# Patient Record
Sex: Female | Born: 1950 | Race: White | Hispanic: No | Marital: Married | Smoking: Never smoker
Health system: Southern US, Community
[De-identification: ages and names within clinical notes are randomized; demographics above are authoritative.]

## PROBLEM LIST (undated history)

## (undated) DIAGNOSIS — K449 Diaphragmatic hernia without obstruction or gangrene: Secondary | ICD-10-CM

## (undated) DIAGNOSIS — D649 Anemia, unspecified: Secondary | ICD-10-CM

## (undated) DIAGNOSIS — T7840XA Allergy, unspecified, initial encounter: Secondary | ICD-10-CM

## (undated) DIAGNOSIS — I341 Nonrheumatic mitral (valve) prolapse: Secondary | ICD-10-CM

## (undated) DIAGNOSIS — Z889 Allergy status to unspecified drugs, medicaments and biological substances status: Secondary | ICD-10-CM

## (undated) DIAGNOSIS — Z950 Presence of cardiac pacemaker: Secondary | ICD-10-CM

## (undated) DIAGNOSIS — K5792 Diverticulitis of intestine, part unspecified, without perforation or abscess without bleeding: Secondary | ICD-10-CM

## (undated) DIAGNOSIS — I10 Essential (primary) hypertension: Secondary | ICD-10-CM

## (undated) DIAGNOSIS — S20219A Contusion of unspecified front wall of thorax, initial encounter: Secondary | ICD-10-CM

## (undated) DIAGNOSIS — M1990 Unspecified osteoarthritis, unspecified site: Secondary | ICD-10-CM

## (undated) DIAGNOSIS — K219 Gastro-esophageal reflux disease without esophagitis: Secondary | ICD-10-CM

## (undated) DIAGNOSIS — F329 Major depressive disorder, single episode, unspecified: Secondary | ICD-10-CM

## (undated) DIAGNOSIS — F419 Anxiety disorder, unspecified: Secondary | ICD-10-CM

## (undated) DIAGNOSIS — R011 Cardiac murmur, unspecified: Secondary | ICD-10-CM

## (undated) DIAGNOSIS — F32A Depression, unspecified: Secondary | ICD-10-CM

## (undated) DIAGNOSIS — H269 Unspecified cataract: Secondary | ICD-10-CM

## (undated) DIAGNOSIS — K635 Polyp of colon: Secondary | ICD-10-CM

## (undated) DIAGNOSIS — I442 Atrioventricular block, complete: Secondary | ICD-10-CM

## (undated) DIAGNOSIS — E785 Hyperlipidemia, unspecified: Secondary | ICD-10-CM

## (undated) DIAGNOSIS — K209 Esophagitis, unspecified: Secondary | ICD-10-CM

## (undated) HISTORY — DX: Diaphragmatic hernia without obstruction or gangrene: K44.9

## (undated) HISTORY — DX: Anxiety disorder, unspecified: F41.9

## (undated) HISTORY — DX: Unspecified cataract: H26.9

## (undated) HISTORY — PX: BREAST EXCISIONAL BIOPSY: SUR124

## (undated) HISTORY — PX: TUBAL LIGATION: SHX77

## (undated) HISTORY — DX: Allergy, unspecified, initial encounter: T78.40XA

## (undated) HISTORY — DX: Gastro-esophageal reflux disease without esophagitis: K21.9

## (undated) HISTORY — DX: Diverticulitis of intestine, part unspecified, without perforation or abscess without bleeding: K57.92

## (undated) HISTORY — PX: BREAST SURGERY: SHX581

## (undated) HISTORY — PX: APPENDECTOMY: SHX54

## (undated) HISTORY — DX: Depression, unspecified: F32.A

## (undated) HISTORY — PX: POLYPECTOMY: SHX149

## (undated) HISTORY — PX: UPPER GASTROINTESTINAL ENDOSCOPY: SHX188

## (undated) HISTORY — PX: EYE SURGERY: SHX253

## (undated) HISTORY — DX: Atrioventricular block, complete: I44.2

## (undated) HISTORY — DX: Anemia, unspecified: D64.9

## (undated) HISTORY — DX: Major depressive disorder, single episode, unspecified: F32.9

## (undated) HISTORY — DX: Polyp of colon: K63.5

## (undated) HISTORY — PX: PACEMAKER INSERTION: SHX728

## (undated) HISTORY — DX: Nonrheumatic mitral (valve) prolapse: I34.1

## (undated) HISTORY — DX: Hyperlipidemia, unspecified: E78.5

---

## 1970-06-21 HISTORY — PX: OTHER SURGICAL HISTORY: SHX169

## 1998-04-21 ENCOUNTER — Other Ambulatory Visit: Admission: RE | Admit: 1998-04-21 | Discharge: 1998-04-21 | Payer: Self-pay | Admitting: Obstetrics and Gynecology

## 1998-07-07 ENCOUNTER — Encounter: Payer: Self-pay | Admitting: *Deleted

## 1998-07-07 ENCOUNTER — Ambulatory Visit (HOSPITAL_BASED_OUTPATIENT_CLINIC_OR_DEPARTMENT_OTHER): Admission: RE | Admit: 1998-07-07 | Discharge: 1998-07-07 | Payer: Self-pay | Admitting: *Deleted

## 1998-07-08 HISTORY — PX: BREAST EXCISIONAL BIOPSY: SUR124

## 1998-11-14 ENCOUNTER — Ambulatory Visit (HOSPITAL_COMMUNITY): Admission: RE | Admit: 1998-11-14 | Discharge: 1998-11-14 | Payer: Self-pay | Admitting: Internal Medicine

## 1998-11-14 ENCOUNTER — Encounter: Payer: Self-pay | Admitting: Internal Medicine

## 1999-04-23 ENCOUNTER — Encounter (INDEPENDENT_AMBULATORY_CARE_PROVIDER_SITE_OTHER): Payer: Self-pay | Admitting: Specialist

## 1999-04-23 ENCOUNTER — Other Ambulatory Visit: Admission: RE | Admit: 1999-04-23 | Discharge: 1999-04-23 | Payer: Self-pay | Admitting: Obstetrics and Gynecology

## 1999-06-19 ENCOUNTER — Encounter: Payer: Self-pay | Admitting: Family Medicine

## 1999-06-19 ENCOUNTER — Encounter: Admission: RE | Admit: 1999-06-19 | Discharge: 1999-06-19 | Payer: Self-pay | Admitting: Family Medicine

## 1999-09-04 ENCOUNTER — Other Ambulatory Visit: Admission: RE | Admit: 1999-09-04 | Discharge: 1999-09-04 | Payer: Self-pay | Admitting: Obstetrics and Gynecology

## 1999-12-12 ENCOUNTER — Encounter: Payer: Self-pay | Admitting: Emergency Medicine

## 1999-12-12 ENCOUNTER — Emergency Department (HOSPITAL_COMMUNITY): Admission: EM | Admit: 1999-12-12 | Discharge: 1999-12-12 | Payer: Self-pay | Admitting: Emergency Medicine

## 2000-06-20 ENCOUNTER — Encounter: Payer: Self-pay | Admitting: Obstetrics and Gynecology

## 2000-06-20 ENCOUNTER — Encounter: Admission: RE | Admit: 2000-06-20 | Discharge: 2000-06-20 | Payer: Self-pay | Admitting: Obstetrics and Gynecology

## 2000-09-05 ENCOUNTER — Ambulatory Visit (HOSPITAL_COMMUNITY): Admission: RE | Admit: 2000-09-05 | Discharge: 2000-09-05 | Payer: Self-pay | Admitting: Cardiology

## 2000-09-05 ENCOUNTER — Encounter: Payer: Self-pay | Admitting: Cardiology

## 2000-10-12 ENCOUNTER — Other Ambulatory Visit: Admission: RE | Admit: 2000-10-12 | Discharge: 2000-10-12 | Payer: Self-pay | Admitting: Obstetrics and Gynecology

## 2001-06-28 ENCOUNTER — Encounter: Admission: RE | Admit: 2001-06-28 | Discharge: 2001-06-28 | Payer: Self-pay | Admitting: Obstetrics and Gynecology

## 2001-06-28 ENCOUNTER — Encounter: Payer: Self-pay | Admitting: Obstetrics and Gynecology

## 2001-11-15 ENCOUNTER — Other Ambulatory Visit: Admission: RE | Admit: 2001-11-15 | Discharge: 2001-11-15 | Payer: Self-pay | Admitting: Internal Medicine

## 2002-08-01 ENCOUNTER — Encounter: Admission: RE | Admit: 2002-08-01 | Discharge: 2002-08-01 | Payer: Self-pay | Admitting: Obstetrics and Gynecology

## 2002-08-01 ENCOUNTER — Encounter: Payer: Self-pay | Admitting: Obstetrics and Gynecology

## 2003-06-19 ENCOUNTER — Ambulatory Visit (HOSPITAL_COMMUNITY): Admission: RE | Admit: 2003-06-19 | Discharge: 2003-06-19 | Payer: Self-pay | Admitting: Cardiology

## 2003-10-11 ENCOUNTER — Encounter: Admission: RE | Admit: 2003-10-11 | Discharge: 2003-10-11 | Payer: Self-pay | Admitting: Obstetrics and Gynecology

## 2004-05-15 ENCOUNTER — Ambulatory Visit: Payer: Self-pay

## 2004-08-10 ENCOUNTER — Ambulatory Visit: Payer: Self-pay | Admitting: Family Medicine

## 2004-08-11 ENCOUNTER — Ambulatory Visit (HOSPITAL_COMMUNITY): Admission: RE | Admit: 2004-08-11 | Discharge: 2004-08-11 | Payer: Self-pay | Admitting: Family Medicine

## 2004-08-13 ENCOUNTER — Ambulatory Visit: Payer: Self-pay | Admitting: Cardiology

## 2004-10-14 ENCOUNTER — Ambulatory Visit: Payer: Self-pay | Admitting: Internal Medicine

## 2004-10-19 ENCOUNTER — Ambulatory Visit: Payer: Self-pay | Admitting: Internal Medicine

## 2004-10-19 HISTORY — PX: COLONOSCOPY: SHX174

## 2004-10-20 ENCOUNTER — Encounter (INDEPENDENT_AMBULATORY_CARE_PROVIDER_SITE_OTHER): Payer: Self-pay | Admitting: Specialist

## 2004-10-20 ENCOUNTER — Encounter: Payer: Self-pay | Admitting: Internal Medicine

## 2004-10-20 ENCOUNTER — Other Ambulatory Visit: Admission: RE | Admit: 2004-10-20 | Discharge: 2004-10-20 | Payer: Self-pay | Admitting: Internal Medicine

## 2004-11-11 ENCOUNTER — Ambulatory Visit: Payer: Self-pay | Admitting: Cardiology

## 2004-12-08 ENCOUNTER — Ambulatory Visit: Payer: Self-pay | Admitting: Internal Medicine

## 2004-12-23 ENCOUNTER — Ambulatory Visit: Payer: Self-pay | Admitting: Cardiology

## 2005-01-27 ENCOUNTER — Ambulatory Visit: Payer: Self-pay | Admitting: Cardiology

## 2005-04-28 ENCOUNTER — Encounter: Admission: RE | Admit: 2005-04-28 | Discharge: 2005-04-28 | Payer: Self-pay | Admitting: Family Medicine

## 2005-05-04 ENCOUNTER — Ambulatory Visit: Payer: Self-pay | Admitting: Cardiology

## 2005-05-25 ENCOUNTER — Ambulatory Visit: Payer: Self-pay | Admitting: Cardiology

## 2005-05-27 ENCOUNTER — Ambulatory Visit: Payer: Self-pay

## 2005-05-27 ENCOUNTER — Ambulatory Visit: Payer: Self-pay | Admitting: Cardiology

## 2005-08-04 ENCOUNTER — Ambulatory Visit: Payer: Self-pay | Admitting: Cardiology

## 2005-09-01 ENCOUNTER — Ambulatory Visit: Payer: Self-pay | Admitting: Cardiology

## 2005-10-13 ENCOUNTER — Ambulatory Visit: Payer: Self-pay | Admitting: Cardiology

## 2006-01-19 ENCOUNTER — Ambulatory Visit: Payer: Self-pay | Admitting: Cardiology

## 2006-02-03 ENCOUNTER — Encounter: Payer: Self-pay | Admitting: Cardiology

## 2006-02-03 ENCOUNTER — Ambulatory Visit: Payer: Self-pay

## 2006-04-27 ENCOUNTER — Ambulatory Visit: Payer: Self-pay | Admitting: Cardiology

## 2006-05-06 ENCOUNTER — Encounter: Admission: RE | Admit: 2006-05-06 | Discharge: 2006-05-06 | Payer: Self-pay | Admitting: *Deleted

## 2006-07-20 ENCOUNTER — Ambulatory Visit: Payer: Self-pay | Admitting: Cardiology

## 2006-10-12 ENCOUNTER — Ambulatory Visit: Payer: Self-pay | Admitting: Cardiology

## 2007-01-04 ENCOUNTER — Ambulatory Visit: Payer: Self-pay | Admitting: Cardiology

## 2007-02-10 ENCOUNTER — Ambulatory Visit: Payer: Self-pay | Admitting: Cardiology

## 2007-03-29 ENCOUNTER — Ambulatory Visit: Payer: Self-pay | Admitting: Cardiology

## 2007-06-02 ENCOUNTER — Encounter: Admission: RE | Admit: 2007-06-02 | Discharge: 2007-06-02 | Payer: Self-pay | Admitting: Obstetrics and Gynecology

## 2007-06-09 ENCOUNTER — Encounter: Admission: RE | Admit: 2007-06-09 | Discharge: 2007-06-09 | Payer: Self-pay | Admitting: Obstetrics and Gynecology

## 2007-06-21 ENCOUNTER — Ambulatory Visit: Payer: Self-pay | Admitting: Cardiology

## 2007-08-02 ENCOUNTER — Ambulatory Visit: Payer: Self-pay

## 2007-08-23 ENCOUNTER — Encounter: Payer: Self-pay | Admitting: Cardiology

## 2007-08-23 ENCOUNTER — Ambulatory Visit: Payer: Self-pay

## 2007-10-02 ENCOUNTER — Emergency Department (HOSPITAL_COMMUNITY): Admission: EM | Admit: 2007-10-02 | Discharge: 2007-10-02 | Payer: Self-pay | Admitting: Emergency Medicine

## 2007-10-03 ENCOUNTER — Ambulatory Visit: Payer: Self-pay

## 2007-10-09 ENCOUNTER — Telehealth: Payer: Self-pay | Admitting: Orthopedic Surgery

## 2007-10-09 ENCOUNTER — Ambulatory Visit: Payer: Self-pay | Admitting: Orthopedic Surgery

## 2007-10-10 DIAGNOSIS — S20219A Contusion of unspecified front wall of thorax, initial encounter: Secondary | ICD-10-CM

## 2007-10-10 HISTORY — DX: Contusion of unspecified front wall of thorax, initial encounter: S20.219A

## 2007-10-11 ENCOUNTER — Encounter: Payer: Self-pay | Admitting: Orthopedic Surgery

## 2007-10-23 ENCOUNTER — Ambulatory Visit: Payer: Self-pay | Admitting: Orthopedic Surgery

## 2007-11-01 ENCOUNTER — Ambulatory Visit: Payer: Self-pay | Admitting: Cardiology

## 2007-11-03 ENCOUNTER — Ambulatory Visit: Payer: Self-pay | Admitting: Cardiology

## 2007-11-03 ENCOUNTER — Ambulatory Visit (HOSPITAL_COMMUNITY): Admission: RE | Admit: 2007-11-03 | Discharge: 2007-11-03 | Payer: Self-pay | Admitting: Cardiology

## 2007-11-27 ENCOUNTER — Encounter: Payer: Self-pay | Admitting: Orthopedic Surgery

## 2007-12-05 ENCOUNTER — Ambulatory Visit: Payer: Self-pay | Admitting: Cardiology

## 2008-01-03 ENCOUNTER — Ambulatory Visit: Payer: Self-pay | Admitting: Cardiology

## 2008-01-12 ENCOUNTER — Encounter: Payer: Self-pay | Admitting: Orthopedic Surgery

## 2008-03-20 ENCOUNTER — Encounter: Payer: Self-pay | Admitting: Orthopedic Surgery

## 2008-04-03 ENCOUNTER — Ambulatory Visit: Payer: Self-pay | Admitting: Cardiology

## 2008-05-24 ENCOUNTER — Encounter: Payer: Self-pay | Admitting: Cardiology

## 2008-05-24 ENCOUNTER — Ambulatory Visit: Payer: Self-pay

## 2008-05-30 ENCOUNTER — Ambulatory Visit: Payer: Self-pay | Admitting: Cardiology

## 2008-06-07 ENCOUNTER — Encounter: Admission: RE | Admit: 2008-06-07 | Discharge: 2008-06-07 | Payer: Self-pay | Admitting: Obstetrics and Gynecology

## 2008-06-25 DIAGNOSIS — I10 Essential (primary) hypertension: Secondary | ICD-10-CM | POA: Insufficient documentation

## 2008-06-25 DIAGNOSIS — K219 Gastro-esophageal reflux disease without esophagitis: Secondary | ICD-10-CM | POA: Insufficient documentation

## 2008-06-25 DIAGNOSIS — K209 Esophagitis, unspecified without bleeding: Secondary | ICD-10-CM | POA: Insufficient documentation

## 2008-06-25 DIAGNOSIS — K449 Diaphragmatic hernia without obstruction or gangrene: Secondary | ICD-10-CM | POA: Insufficient documentation

## 2008-06-25 DIAGNOSIS — I059 Rheumatic mitral valve disease, unspecified: Secondary | ICD-10-CM | POA: Insufficient documentation

## 2008-06-25 DIAGNOSIS — I429 Cardiomyopathy, unspecified: Secondary | ICD-10-CM | POA: Insufficient documentation

## 2008-06-25 DIAGNOSIS — E785 Hyperlipidemia, unspecified: Secondary | ICD-10-CM | POA: Insufficient documentation

## 2008-06-25 HISTORY — DX: Esophagitis, unspecified without bleeding: K20.90

## 2008-06-28 ENCOUNTER — Ambulatory Visit: Payer: Self-pay | Admitting: Internal Medicine

## 2008-06-28 DIAGNOSIS — Z860100 Personal history of colon polyps, unspecified: Secondary | ICD-10-CM | POA: Insufficient documentation

## 2008-07-02 ENCOUNTER — Encounter: Payer: Self-pay | Admitting: Internal Medicine

## 2008-07-02 ENCOUNTER — Ambulatory Visit: Payer: Self-pay | Admitting: Internal Medicine

## 2008-07-02 ENCOUNTER — Ambulatory Visit (HOSPITAL_COMMUNITY): Admission: RE | Admit: 2008-07-02 | Discharge: 2008-07-02 | Payer: Self-pay | Admitting: Internal Medicine

## 2008-07-03 ENCOUNTER — Encounter: Payer: Self-pay | Admitting: Internal Medicine

## 2008-07-04 ENCOUNTER — Ambulatory Visit: Payer: Self-pay | Admitting: Cardiology

## 2008-07-08 ENCOUNTER — Telehealth (INDEPENDENT_AMBULATORY_CARE_PROVIDER_SITE_OTHER): Payer: Self-pay | Admitting: *Deleted

## 2008-08-09 ENCOUNTER — Ambulatory Visit: Payer: Self-pay | Admitting: Internal Medicine

## 2008-10-04 ENCOUNTER — Encounter (INDEPENDENT_AMBULATORY_CARE_PROVIDER_SITE_OTHER): Payer: Self-pay

## 2008-11-02 ENCOUNTER — Ambulatory Visit: Payer: Self-pay | Admitting: Cardiology

## 2008-11-07 DIAGNOSIS — F410 Panic disorder [episodic paroxysmal anxiety] without agoraphobia: Secondary | ICD-10-CM | POA: Insufficient documentation

## 2008-11-07 DIAGNOSIS — F411 Generalized anxiety disorder: Secondary | ICD-10-CM | POA: Insufficient documentation

## 2008-11-07 DIAGNOSIS — I442 Atrioventricular block, complete: Secondary | ICD-10-CM | POA: Insufficient documentation

## 2009-01-31 ENCOUNTER — Telehealth: Payer: Self-pay | Admitting: Cardiology

## 2009-01-31 ENCOUNTER — Encounter: Payer: Self-pay | Admitting: Cardiology

## 2009-01-31 ENCOUNTER — Ambulatory Visit: Payer: Self-pay | Admitting: Internal Medicine

## 2009-05-05 ENCOUNTER — Ambulatory Visit: Payer: Self-pay | Admitting: Cardiology

## 2009-05-06 ENCOUNTER — Telehealth: Payer: Self-pay | Admitting: Cardiology

## 2009-06-04 ENCOUNTER — Ambulatory Visit: Payer: Self-pay | Admitting: Cardiology

## 2009-06-12 ENCOUNTER — Encounter: Admission: RE | Admit: 2009-06-12 | Discharge: 2009-06-12 | Payer: Self-pay | Admitting: *Deleted

## 2009-06-12 ENCOUNTER — Encounter: Payer: Self-pay | Admitting: Cardiology

## 2009-06-24 ENCOUNTER — Encounter: Admission: RE | Admit: 2009-06-24 | Discharge: 2009-06-24 | Payer: Self-pay | Admitting: *Deleted

## 2009-08-04 ENCOUNTER — Ambulatory Visit: Payer: Self-pay | Admitting: Cardiology

## 2009-11-03 ENCOUNTER — Ambulatory Visit: Payer: Self-pay | Admitting: Cardiology

## 2010-02-02 ENCOUNTER — Ambulatory Visit: Payer: Self-pay | Admitting: Cardiology

## 2010-02-03 ENCOUNTER — Telehealth (INDEPENDENT_AMBULATORY_CARE_PROVIDER_SITE_OTHER): Payer: Self-pay | Admitting: *Deleted

## 2010-04-17 ENCOUNTER — Encounter (INDEPENDENT_AMBULATORY_CARE_PROVIDER_SITE_OTHER): Payer: Self-pay | Admitting: *Deleted

## 2010-04-23 ENCOUNTER — Encounter: Payer: Self-pay | Admitting: Internal Medicine

## 2010-04-23 ENCOUNTER — Telehealth: Payer: Self-pay | Admitting: Cardiology

## 2010-05-22 ENCOUNTER — Encounter: Payer: Self-pay | Admitting: Cardiology

## 2010-05-22 ENCOUNTER — Ambulatory Visit (HOSPITAL_COMMUNITY)
Admission: RE | Admit: 2010-05-22 | Discharge: 2010-05-22 | Payer: Self-pay | Source: Home / Self Care | Admitting: Cardiology

## 2010-05-22 ENCOUNTER — Ambulatory Visit: Payer: Self-pay

## 2010-05-22 ENCOUNTER — Ambulatory Visit: Payer: Self-pay | Admitting: Cardiology

## 2010-05-22 DIAGNOSIS — R011 Cardiac murmur, unspecified: Secondary | ICD-10-CM | POA: Insufficient documentation

## 2010-06-18 ENCOUNTER — Encounter
Admission: RE | Admit: 2010-06-18 | Discharge: 2010-06-18 | Payer: Self-pay | Source: Home / Self Care | Attending: *Deleted | Admitting: *Deleted

## 2010-07-03 ENCOUNTER — Ambulatory Visit
Admission: RE | Admit: 2010-07-03 | Discharge: 2010-07-03 | Payer: Self-pay | Source: Home / Self Care | Attending: Cardiology | Admitting: Cardiology

## 2010-07-03 DIAGNOSIS — R079 Chest pain, unspecified: Secondary | ICD-10-CM | POA: Insufficient documentation

## 2010-07-06 ENCOUNTER — Encounter: Payer: Self-pay | Admitting: Internal Medicine

## 2010-07-21 NOTE — Miscellaneous (Signed)
Summary: Nexium Refill  Clinical Lists Changes  Medications: Changed medication from NEXIUM 40 MG  CPDR (ESOMEPRAZOLE MAGNESIUM) 1 by mouth two times a day to NEXIUM 40 MG  CPDR (ESOMEPRAZOLE MAGNESIUM) 1 by mouth two times a day. - Signed Rx of NEXIUM 40 MG  CPDR (ESOMEPRAZOLE MAGNESIUM) 1 by mouth two times a day.;  #180 x 1;  Signed;  Entered by: Lamona Curl CMA (AAMA);  Authorized by: Hart Carwin MD;  Method used: Electronically to [facility]*, [address], [address] Shipping/mailing, [place], [place]  [address], Ph: [phone], Fax: [phone]    Prescriptions: NEXIUM 40 MG  CPDR (ESOMEPRAZOLE MAGNESIUM) 1 by mouth two times a day.  #180 x 1   Entered by:   Lamona Curl CMA (AAMA)   Authorized by:   Hart Carwin MD   Signed by:   Lamona Curl CMA (AAMA) on 04/23/2010   Method used:   Electronically to        [facility]* (retail)       [address]       [address] Shipping/mailing       [place], [place]  [address]       Ph: [phone]       Fax: [phone]   RxID:   [id number]

## 2010-07-21 NOTE — Cardiovascular Report (Signed)
Summary: TTM   TTM   Imported By: Roderic Ovens 03/19/2010 16:10:19  _____________________________________________________________________  External Attachment:    Type:   Image     Comment:   External Document

## 2010-07-21 NOTE — Cardiovascular Report (Signed)
Summary: TTM   [facility]   Imported By: Roderic Ovens 09/18/2009 13:29:56  _____________________________________________________________________  External Attachment:    Type:   Image     Comment:   External Document

## 2010-07-21 NOTE — Progress Notes (Signed)
Summary: pt has question regarding transmission   Phone Note Outgoing Call Call back at [phone]   Caller: Patient Reason for Call: Talk to Nurse, Talk to Doctor Summary of Call: pt has a question regarding the transmission she sent last night  Initial call taken by: Omer Jack,  February 03, 2010 11:35 AM Summary of Call: spoke w/pt --had phone check thru [employer] and was concerned about the magnet she had.  pt has an old transmitter and magnet does not have hole in middle.  Told pt we could give her new magnet at next visit if need be but the magnet she has is working fine. Vella Kohler  February 03, 2010 3:26 PM

## 2010-07-21 NOTE — Cardiovascular Report (Signed)
Summary: Transtelephonic Pacemaker Monitoring Report  Transtelephonic Pacemaker Monitoring Report   Imported By: Marylou Mccoy 02/12/2010 10:21:53  _____________________________________________________________________  External Attachment:    Type:   Image     Comment:   External Document

## 2010-07-21 NOTE — Miscellaneous (Signed)
Summary: dx correction   Clinical Lists Changes  Problems: Changed problem from PACEMAKER (ICD-V45.Marland Kitchen01) to PACEMAKER, PERMANENT (ICD-V45.01) changed the incorrect dx code to correct dx code Genella Mech  April 17, 2010 12:23 PM

## 2010-07-21 NOTE — Progress Notes (Signed)
Summary: strange pain in chest   Phone Note Call from Patient   Caller: Patient [phone] Reason for Call: Talk to Nurse Summary of Call: pt having a strange pain in chest behind left breast area started yesterday at 2p, not sure if gas bubble, took antacid some refief but not completely gone away-has pacemaker and has never felt anything like this before-pls call  Initial call taken by: Glynda Jaeger,  April 23, 2010 11:09 AM  Follow-up for Phone Call        I called and spoke with the pt. She states that her discomfort behind the left breast did start about 2:00pm yesterday. She ate a salad, roll, & pie for lunch, but she states eating salad is not out of the norm for her. She took rolaids yesterday without relief. She has taken alka-seltzer x 2 doses this morning with some relief. She has been unable to belch. She denies any radation of the discomfort to any other areas. She also denies nausea and vomiting. She feels this is GI related. I explained she could monitor this over the next couple of hours to see how she feels. If her symptoms worsen in the same area, she will call me back and let me know. We will bring her in to see Dr. Juanda Chance. If her symptoms get worse with radiation of discomfort to any other areas, she will report to the ER. She verbalizes understanding and is agreeable. Follow-up by: Sherri Rad, RN, BSN,  April 23, 2010 11:23 AM

## 2010-07-21 NOTE — Assessment & Plan Note (Signed)
Summary: f1y  Medications Added NEXIUM 40 MG  CPDR (ESOMEPRAZOLE MAGNESIUM) 1 tab two times a day EQL B-6 100 MG  TABS (PYRIDOXINE HCL) 2 tab once daily MULTIVITAMINS   TABS (MULTIPLE VITAMIN) 1 tab once daily FISH OIL 1000 MG CAPS (OMEGA-3 FATTY ACIDS) 1 cap once daily MUCINEX 600 MG XR12H-TAB (GUAIFENESIN) 1 tab once daily as needed        Visit Type:  1 yr f/u Primary Jehieli Brassell:  [name] MD  CC:  pt states she had 2 months ago some cp but thinks tis was a muscle strain this did resolve on it's own...denies any other cardiac complaints today.  History of Present Illness: The patient is 60 years old and return for followup management of her pacemaker. She has a Engineer, water. 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  About 6 weeks ago she had an episode of left-sided chest pain while at work which lasted about 24 hours. There was no associated shortness of breath nausea diaphoresis or palpitations. There was no relation to breathing or position. Her symptoms later resolved.   She has been under great stress at work. 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  Other problems include hypertension and hyperlipidemia.  Current Medications (verified): 1)  Buspar 10 Mg  Tabs (Buspirone Hcl) .... Tid 2)  Wellbutrin Xl 300 Mg  Tb24 (Bupropion Hcl) .Marland Kitchen.. 1qd 3)  Lisinopril-Hydrochlorothiazide 20-12.5 Mg  Tabs (Lisinopril-Hydrochlorothiazide) .... One Qd 4)  Crestor 10 Mg  Tabs (Rosuvastatin Calcium) .Marland Kitchen.. 1 By Mouth Once Daily 5)  Prempro 0.45-1.5 Mg Tabs (Conj Estrog-Medroxyprogest Ace) .Marland Kitchen.. 1 Tab Once Daily 6)  Nexium 40 Mg  Cpdr (Esomeprazole Magnesium) .Marland Kitchen.. 1 Tab Two Times A Day 7)  Eql B-6 100 Mg  Tabs (Pyridoxine Hcl) .... 2 Tab Once Daily 8)  Multivitamins   Tabs (Multiple Vitamin) .Marland Kitchen.. 1 Tab Once Daily 9)  Zyrtec Allergy 10 Mg Tabs (Cetirizine Hcl) .Marland Kitchen.. 1 By Mouth Once Daily As Needed 10)  Fish Oil 1000 Mg Caps (Omega-3 Fatty Acids) .Marland Kitchen.. 1 Cap Once Daily 11)  Astepro  137 Mcg/spray Soln (Azelastine Hcl) .... One Sray As Needed 12)  Lorazepam 0.5 Mg Tabs (Lorazepam) .Marland Kitchen.. 1 Tab At Bedtime 13)  Mucinex 600 Mg Xr12h-Tab (Guaifenesin) .Marland Kitchen.. 1 Tab Once Daily As Needed  Allergies: 1)  ! Keflex 2)  ! Pcn  Past History:  Past Medical History: Reviewed history from 11/07/2008 and no changes required. CARDIOMYOPATHY (ICD-425.4) PACEMAKER (ICD-V45.Marland Kitchen01) MITRAL VALVE PROLAPSE (ICD-424.0) HYPERLIPIDEMIA (ICD-272.4) HYPERTENSION (ICD-401.9) ANXIETY (ICD-300.00) CONTUSION, RIGHT RIB (ICD-922.1) CHEST WALL CONTUSION (ICD-922.1) HIATAL HERNIA (ICD-553.3) GERD (ICD-530.81) COLONIC POLYPS, HX OF (ICD-V12.72) Hx of ESOPHAGITIS (ICD-530.10)  Review of Systems       ROS is negative except as outlined in HPI.   Vital Signs:  Patient profile:   60 year old female Height:      61 inches Weight:      158.50 pounds BMI:     30.06 Pulse rate:   92 / minute Pulse rhythm:   irregular BP sitting:   118 / 70  (left arm) Cuff size:   large  Vitals Entered By: Danielle Rankin, CMA (May 22, 2010 1:44 PM)  Physical Exam  Additional Exam:  Gen. Well-nourished, in no distress   Neck: No JVD, thyroid not enlarged, no carotid bruits Lungs: No tachypnea, clear without rales, rhonchi or wheezes Cardiovascular: Rhythm regular, PMI not displaced, grade 2-3/6 harsh pansystolic murmur at the apex, accentuated S2 which sounded  single, no peripheral edema, pulses normal in all 4 extremities. Abdomen: BS normal, abdomen soft and non-tender without masses or organomegaly, no hepatosplenomegaly. MS: No deformities, no cyanosis or clubbing   Neuro:  No focal sns   Skin:  no lesions    PPM Specifications Following MD:  Everardo Beals. Juanda Chance, MD     PPM Vendor:  St Jude     PPM Model Number:  [id number]     PPM Serial Number:  [id number] PPM DOI:  06/19/2003     PPM Implanting MD:  Everardo Beals. Juanda Chance, MD  Lead 1    Location: RV     DOI: 09/07/1990     Model #: 1028T     Serial #: [id number]      Status: active Lead 2    Location: LV     DOI: 09/07/1990     Model #: 1216T     Serial #: [id number]     Status: active   Indications:  CHB   PPM Follow Up Battery Voltage:  2.69 V     Battery Est. Longevity:  1.25-2 yrs     Pacer Dependent:  Yes       PPM Device Measurements Atrium  Amplitude: 2.4 mV, Impedance: 449 ohms, Threshold: 1.25 V at 0.6 msec Right Ventricle  Amplitude: PACED mV, Impedance: 426 ohms, Threshold: 1.0 V at 0.4 msec  Episodes MS Episodes:  7     Percent Mode Switch:  <1%     Coumadin:  No Ventricular High Rate:  0     Atrial Pacing:  <1%     Ventricular Pacing:  99%  Parameters Mode:  DDD     Lower Rate Limit:  60     Upper Rate Limit:  130 Paced AV Delay:  170     Sensed AV Delay:  150 Next Cardiology Appt Due:  11/20/2010 Tech Comments:  7 AMS EPISODES--LONGEST WAS 3 MINUTES 16 SECONDS.  NORMAL DEVICE FUNCTION.  CHANGED ER SENSITIVITY FROM 2.3 TO 3.82mV AND RV OUTPUT FROM 1.5 TO 1.250 DUE TO AUTOCAPTURE BEING TURNED ON.  TURNED RATE RESPONSE ON DURING MODE SWITCH. ROV IN 6 MTHS W/DEVICE CLINIC. Vella Kohler  May 25, 2010 11:09 AM  Impression & Recommendations:  Problem # 1:  CARDIAC MURMUR (ICD-785.2)  She has a new systolic murmur that sounds like mitral regurgitation. She had episode of chest pain 6 weeks ago. I am suspicious that she has a ruptured cordae. We'll plan to add her with an echocardiogram.  D. echocardiogram today and I reviewed this with Dr. Myrtis Ser. She has some slight bowing of the anterior leaflet. There is no clear flail  leaflet. She has mild possibly moderate mitral regurgitation.  I think that she may have ruptured chordae accounting for her new murmur and possibly her recent chest pain. I think we need to her fairly closely. I arranged for her to see Dr. Shirlee Latch back in 5 and 6 weeks. I don't think we'll need to do an echo then unless there is some change in her symptomatology and her physical findings. I told her to call us if she has  any increased shortness of breath.   Her updated medication list for this problem includes:    Lisinopril-hydrochlorothiazide 20-12.5 Mg Tabs (Lisinopril-hydrochlorothiazide) ..... One qd  Orders: Echocardiogram (Echo)  Problem # 2:  PACEMAKER, PERMANENT (ICD-V45.01) She is a St. Jude pacemaker was implanted for AV block. We'll plan to interrogate this today.  Problem # 3:  HYPERTENSION (ICD-401.9) This is well controlled on current medications. Her updated medication list for this problem includes:    Lisinopril-hydrochlorothiazide 20-12.5 Mg Tabs (Lisinopril-hydrochlorothiazide) ..... One qd  Patient Instructions: 1)  Echocardiogram today. 2)  Your physician recommends that you schedule a follow-up appointment in: 6 weeks with Dr. Shirlee Latch. 3)  Your physician wants you to follow-up in: 1 year with Dr. Johney Frame for your pacemaker followup.  You will receive a reminder letter in the mail two months in advance. If you don't receive a letter, please call our office to schedule the follow-up appointment. 4)  Your physician recommends that you continue on your current medications as directed. Please refer to the Current Medication list given to you today.

## 2010-07-23 NOTE — Cardiovascular Report (Signed)
Summary: Office Visit   Office Visit   Imported By: Roderic Ovens 06/18/2010 09:52:04  _____________________________________________________________________  External Attachment:    Type:   Image     Comment:   External Document

## 2010-07-23 NOTE — Assessment & Plan Note (Signed)
Summary: ec6      Allergies Added:   Visit Type:  np Primary Provider:  [name] MD  CC:  headache.  History of Present Illness: 60 yo with history of pacemaker for AV block and MV prolapse presents for cardiology followup.  She saw Dr. Juanda Chance in December with an episode of chest pain and concern for possible worsening mitral regurgitation/chordal rupture.  Echo was done showing mitral valve prolapse and only mild mitral regurgitation.  She has not had any more chest pain.  She has chronic dyspnea when climbing a flight of steps.  She is able to walk on flat ground without problems.  No orthopnea or PND.  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    Current Medications (verified): 1)  Buspar 10 Mg  Tabs (Buspirone Hcl) .... Tid 2)  Wellbutrin Xl 300 Mg  Tb24 (Bupropion Hcl) .Marland Kitchen.. 1qd 3)  Lisinopril-Hydrochlorothiazide 20-12.5 Mg  Tabs (Lisinopril-Hydrochlorothiazide) .... One Qd 4)  Crestor 10 Mg  Tabs (Rosuvastatin Calcium) .Marland Kitchen.. 1 By Mouth Once Daily 5)  Prempro 0.45-1.5 Mg Tabs (Conj Estrog-Medroxyprogest Ace) .Marland Kitchen.. 1 Tab Once Daily 6)  Nexium 40 Mg  Cpdr (Esomeprazole Magnesium) .Marland Kitchen.. 1 Tab Two Times A Day 7)  Eql B-6 100 Mg  Tabs (Pyridoxine Hcl) .... 2 Tab Once Daily 8)  Multivitamins   Tabs (Multiple Vitamin) .Marland Kitchen.. 1 Tab Once Daily 9)  Zyrtec Allergy 10 Mg Tabs (Cetirizine Hcl) .Marland Kitchen.. 1 By Mouth Once Daily As Needed 10)  Fish Oil 1000 Mg Caps (Omega-3 Fatty Acids) .Marland Kitchen.. 1 Cap Once Daily 11)  Astepro 137 Mcg/spray Soln (Azelastine Hcl) .... One Sray As Needed 12)  Lorazepam 0.5 Mg Tabs (Lorazepam) .Marland Kitchen.. 1 Tab At Bedtime 13)  Mucinex 600 Mg Xr12h-Tab (Guaifenesin) .Marland Kitchen.. 1 Tab Once Daily As Needed  Allergies (verified): 1)  ! Keflex 2)  ! Pcn  Past History:  Past Medical History: 1. 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  2. 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  3. HYPERLIPIDEMIA (ICD-272.4) 4. HYPERTENSION (ICD-401.9) 5. ANXIETY (ICD-300.00) 6. HIATAL HERNIA (ICD-553.3) 7. GERD (ICD-530.81) 8. COLONIC POLYPS, HX OF (ICD-V12.72) 9. Hx of ESOPHAGITIS (ICD-530.10) 10. LHC (5/09): no coronary disease  Family History: Reviewed history from 06/25/2008 and no changes required. Family History of Diabetes: Mother, Grandmother Family History of Heart Disease: Maternal Grandmother, Maternal Aunt  Social History: Illicit Drug Use - no Patient has never smoked.  Alcohol Use - no Patient does not get regular exercise.  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   Vital Signs:  Patient profile:   60 year old female Height:      61 inches Weight:      155.75 pounds BMI:     29.54 Pulse rate:   93 / minute BP sitting:   137 / 73  (left arm) Cuff size:   regular  Vitals Entered By: Caralee Ates CMA (July 03, 2010 10:06 AM)  Physical Exam  General:  Well developed, well nourished, in no acute distress. Neck:  Neck supple, no JVD. No masses, thyromegaly or abnormal cervical nodes. Lungs:  Clear bilaterally to auscultation and percussion. Heart:  Non-displaced PMI,  chest non-tender; regular rate and rhythm, S1, S2 without rubs or gallops. 2/6 HSM at apex.  Carotid upstroke normal, no bruit. Pedals normal pulses. No edema, no varicosities. Abdomen:  Bowel sounds positive; abdomen soft and non-tender without masses, organomegaly, or hernias noted. No hepatosplenomegaly. Extremities:  No clubbing or cyanosis. Neurologic:  Alert and oriented x 3. Psych:  Normal affect.   PPM Specifications Following MD:  Everardo Beals. Juanda Chance, MD     PPM Vendor:  St Jude     PPM Model Number:  [id number]     PPM Serial Number:  [id number] PPM DOI:  06/19/2003     PPM  Implanting MD:  Everardo Beals. Juanda Chance, MD  Lead 1    Location: RV     DOI: 09/07/1990     Model #: 1028T     Serial #: [id number]     Status: active Lead 2    Location: LV     DOI: 09/07/1990     Model #: 1216T     Serial #: [id number]     Status: active   Indications:  CHB   PPM Follow Up Pacer Dependent:  Yes      Episodes Coumadin:  No  Parameters Mode:  DDD     Lower Rate Limit:  60     Upper Rate Limit:  130 Paced AV Delay:  170     Sensed AV Delay:  150  Impression & Recommendations:  Problem # 1:  MITRAL VALVE PROLAPSE (ICD-424.0) Echo showed stable mild mitral regurgitation in the setting of mitral valve prolapse.  Followup echo in 1 year.   Problem # 2:  PACEMAKER, PERMANENT (ICD-V45.01) Getting regular pacemaker followup.   Problem # 3:  CHEST PAIN-UNSPECIFIED (ICD-786.50) No further chest pain.  Of note, had negative cath in 2009.   Patient Instructions: 1)  Your physician wants you to follow-up in: 1 year.  You will receive a reminder letter in the mail two months in advance. If you don't receive a letter, please call our office to schedule the follow-up appointment. 2)  Your physician has requested that you have an echocardiogram in 1 year.  Echocardiography is a painless test that uses sound waves to create images of your heart. It provides your doctor with information about the size and shape of your heart and how well your heart's chambers and valves are working.  This procedure takes approximately one hour. There are no restrictions for this procedure. 3)  Your physician recommends that you continue on your current medications as directed. Please refer to the Current Medication list given to you today.

## 2010-07-23 NOTE — Letter (Signed)
Summary: Endoscopy Letter  [facility]  [address] [place], [place] [address]   Phone: [phone]  Fax: [phone]      July 06, 2010 MRN: [id number]   Carmen Green [address] [place], [place]  [address]   Dear Ms. Smithers,   According to your medical record, it is time for you to schedule an Endoscopy. Endoscopic screening is recommended for patients with certain upper digestive tract conditions because of associated increased risk for cancers of the upper digestive system.  This letter has been generated based on the recommendations made at the time of your prior procedure. If you feel that in your particular situation this may no longer apply, please contact our office.  Please call our office at [phone]) to schedule this appointment or to update your records at your earliest convenience.  Thank you for cooperating with [place] to provide you with the very best care possible.   Sincerely,  Hedwig Morton. Juanda Chance, M.D.  [facility] Gastroenterology Division [phone]

## 2010-08-22 ENCOUNTER — Encounter: Payer: Self-pay | Admitting: Internal Medicine

## 2010-08-22 DIAGNOSIS — I442 Atrioventricular block, complete: Secondary | ICD-10-CM

## 2010-08-26 ENCOUNTER — Encounter (INDEPENDENT_AMBULATORY_CARE_PROVIDER_SITE_OTHER): Payer: Self-pay | Admitting: *Deleted

## 2010-09-01 NOTE — Letter (Signed)
Summary: Pre Visit Letter Revised  [facility]  [address] [place], [place] [address]   Phone: [phone]  Fax: [phone]        08/26/2010 MRN: [id number] Carmen Green [address] [place], [place]  [address]             Procedure Date:  10-02-10           Recall Endo---Dr. Juanda Chance   Welcome to the Gastroenterology Division at [facility].    You are scheduled to see a nurse for your pre-procedure visit on 09-18-10 at 1:30p.m. on the 3rd floor at [facility], [address]  We ask that you try to arrive at our office 15 minutes prior to your appointment time to allow for check-in.  Please take a minute to review the attached form.  If you answer "Yes" to one or more of the questions on the first page, we ask that you call the person listed at your earliest opportunity.  If you answer "No" to all of the questions, please complete the rest of the form and bring it to your appointment.    Your nurse visit will consist of discussing your medical and surgical history, your immediate family medical history, and your medications.   If you are unable to list all of your medications on the form, please bring the medication bottles to your appointment and we will list them.  We will need to be aware of both prescribed and over the counter drugs.  We will need to know exact dosage information as well.    Please be prepared to read and sign documents such as consent forms, a financial agreement, and acknowledgement forms.  If necessary, and with your consent, a friend or relative is welcome to sit-in on the nurse visit with you.  Please bring your insurance card so that we may make a copy of it.  If your insurance requires a referral to see a specialist, please bring your referral form from your primary care physician.  No co-pay is required for this nurse visit.     If you cannot keep your appointment, please call [phone] to cancel or reschedule prior to your  appointment date.  This allows [place] the opportunity to schedule an appointment for another patient in need of care.    Thank you for choosing [facility] for your medical needs.  We appreciate the opportunity to care for you.  Please visit [place] at our website  to learn more about our practice.  Sincerely, The Gastroenterology Division

## 2010-09-17 ENCOUNTER — Other Ambulatory Visit (INDEPENDENT_AMBULATORY_CARE_PROVIDER_SITE_OTHER): Payer: 59

## 2010-09-17 ENCOUNTER — Telehealth: Payer: Self-pay | Admitting: Internal Medicine

## 2010-09-17 DIAGNOSIS — R1084 Generalized abdominal pain: Secondary | ICD-10-CM

## 2010-09-17 DIAGNOSIS — R109 Unspecified abdominal pain: Secondary | ICD-10-CM

## 2010-09-17 LAB — CBC WITH DIFFERENTIAL/PLATELET
Basophils Absolute: 0.1 10*3/uL (ref 0.0–0.1)
Basophils Relative: 0.6 % (ref 0.0–3.0)
Eosinophils Absolute: 0.1 10*3/uL (ref 0.0–0.7)
Eosinophils Relative: 0.8 % (ref 0.0–5.0)
HCT: 34.8 % — ABNORMAL LOW (ref 36.0–46.0)
Hemoglobin: 11.9 g/dL — ABNORMAL LOW (ref 12.0–15.0)
Lymphocytes Relative: 8.9 % — ABNORMAL LOW (ref 12.0–46.0)
Lymphs Abs: 1.1 10*3/uL (ref 0.7–4.0)
MCHC: 34.3 g/dL (ref 30.0–36.0)
MCV: 97.4 fl (ref 78.0–100.0)
Monocytes Absolute: 1.1 10*3/uL — ABNORMAL HIGH (ref 0.1–1.0)
Monocytes Relative: 8.7 % (ref 3.0–12.0)
Neutro Abs: 10.1 10*3/uL — ABNORMAL HIGH (ref 1.4–7.7)
Neutrophils Relative %: 81 % — ABNORMAL HIGH (ref 43.0–77.0)
Platelets: 249 10*3/uL (ref 150.0–400.0)
RBC: 3.58 Mil/uL — ABNORMAL LOW (ref 3.87–5.11)
RDW: 11.6 % (ref 11.5–14.6)
WBC: 12.4 10*3/uL — ABNORMAL HIGH (ref 4.5–10.5)

## 2010-09-17 MED ORDER — CIPROFLOXACIN HCL 500 MG PO TABS: 500.0000 mg | ORAL_TABLET | Freq: Two times a day (BID) | ORAL | Status: DC

## 2010-09-17 NOTE — Telephone Encounter (Signed)
Please schedule CT scan of the abd/pelvis with oral and IV contrast r/o diverticulitis. I have revviewed her chart. 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 Please start full liquid diet x 24 hours, Cipro 500mg  po bid,#14, 0 refill she will need a renoal profile and CBC before CT scan.

## 2010-09-17 NOTE — Telephone Encounter (Signed)
Patient calling to report abdominal pain just below her belly button that goes to her back since Tuesday. States she thought it was a UTI and she saw her PCP yesterday. Urine was clear except a "trace of blood." She had chills and temp.of 100 yesterday. PCP gave her pain pills which she did not take. States she is having nausea but this is her usual. She has been constipated and had painful bowel movements. She took Miralax and this helped some. Denies vomiting, bleeding or diarrhea. States she did have a kidney stone when she was 60 years old. 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 Please, advise.

## 2010-09-17 NOTE — Telephone Encounter (Signed)
Spoke with patient and gave her Dr. Regino Schultze recommendations. Patient to come for labs and to pick up contrast today. CT scan abd/pelvis with contrast scheduled at [facility] CT on 09/18/10 at 9:00 AM. Patient to be NPO 4 hours prior and to drink contrast two hours and one hour prior. Contrast up front for patient pick up. Rx sent to patients pharmacy.

## 2010-09-18 ENCOUNTER — Ambulatory Visit (INDEPENDENT_AMBULATORY_CARE_PROVIDER_SITE_OTHER)
Admission: RE | Admit: 2010-09-18 | Discharge: 2010-09-18 | Disposition: A | Discharge status: Home / Self Care | Payer: 59 | Source: Ambulatory Visit | Attending: Internal Medicine | Admitting: Internal Medicine

## 2010-09-18 ENCOUNTER — Telehealth: Payer: Self-pay | Admitting: *Deleted

## 2010-09-18 ENCOUNTER — Encounter: Payer: Self-pay | Admitting: Internal Medicine

## 2010-09-18 ENCOUNTER — Inpatient Hospital Stay (HOSPITAL_COMMUNITY)
Admission: AD | Admit: 2010-09-18 | Discharge: 2010-09-21 | DRG: 392 | Disposition: A | Discharge status: Home / Self Care | Payer: 59 | Source: Ambulatory Visit | Attending: Internal Medicine | Admitting: Internal Medicine

## 2010-09-18 ENCOUNTER — Ambulatory Visit (INDEPENDENT_AMBULATORY_CARE_PROVIDER_SITE_OTHER): Payer: 59 | Admitting: Internal Medicine

## 2010-09-18 ENCOUNTER — Inpatient Hospital Stay (HOSPITAL_COMMUNITY): Payer: 59

## 2010-09-18 VITALS — BP 122/70 | HR 80 | Wt 156.8 lb

## 2010-09-18 DIAGNOSIS — K630 Abscess of intestine: Secondary | ICD-10-CM | POA: Diagnosis present

## 2010-09-18 DIAGNOSIS — R933 Abnormal findings on diagnostic imaging of other parts of digestive tract: Secondary | ICD-10-CM

## 2010-09-18 DIAGNOSIS — K5732 Diverticulitis of large intestine without perforation or abscess without bleeding: Secondary | ICD-10-CM

## 2010-09-18 DIAGNOSIS — I4430 Unspecified atrioventricular block: Secondary | ICD-10-CM | POA: Diagnosis present

## 2010-09-18 DIAGNOSIS — R109 Unspecified abdominal pain: Secondary | ICD-10-CM

## 2010-09-18 DIAGNOSIS — R1032 Left lower quadrant pain: Secondary | ICD-10-CM

## 2010-09-18 DIAGNOSIS — E785 Hyperlipidemia, unspecified: Secondary | ICD-10-CM | POA: Diagnosis present

## 2010-09-18 DIAGNOSIS — I10 Essential (primary) hypertension: Secondary | ICD-10-CM | POA: Diagnosis present

## 2010-09-18 DIAGNOSIS — Z950 Presence of cardiac pacemaker: Secondary | ICD-10-CM

## 2010-09-18 DIAGNOSIS — F411 Generalized anxiety disorder: Secondary | ICD-10-CM | POA: Diagnosis present

## 2010-09-18 DIAGNOSIS — K219 Gastro-esophageal reflux disease without esophagitis: Secondary | ICD-10-CM | POA: Diagnosis present

## 2010-09-18 HISTORY — DX: Essential (primary) hypertension: I10

## 2010-09-18 HISTORY — DX: Presence of cardiac pacemaker: Z95.0

## 2010-09-18 LAB — COMPREHENSIVE METABOLIC PANEL
ALT: 14 U/L (ref 0–35)
Albumin: 3.7 g/dL (ref 3.5–5.2)
Alkaline Phosphatase: 53 U/L (ref 39–117)
Glucose, Bld: 109 mg/dL — ABNORMAL HIGH (ref 70–99)
Potassium: 3.2 mEq/L — ABNORMAL LOW (ref 3.5–5.1)
Sodium: 133 mEq/L — ABNORMAL LOW (ref 135–145)
Total Protein: 7.6 g/dL (ref 6.0–8.3)

## 2010-09-18 LAB — SEDIMENTATION RATE: Sed Rate: 61 mm/hr — ABNORMAL HIGH (ref 0–22)

## 2010-09-18 LAB — CBC
HCT: 35.1 % — ABNORMAL LOW (ref 36.0–46.0)
Platelets: 249 10*3/uL (ref 150–400)
RDW: 11.4 % — ABNORMAL LOW (ref 11.5–15.5)
WBC: 11.3 10*3/uL — ABNORMAL HIGH (ref 4.0–10.5)

## 2010-09-18 LAB — URINALYSIS, ROUTINE W REFLEX MICROSCOPIC
Glucose, UA: NEGATIVE mg/dL
Ketones, ur: NEGATIVE mg/dL
Protein, ur: NEGATIVE mg/dL

## 2010-09-18 LAB — TSH: TSH: 0.967 u[IU]/mL (ref 0.350–4.500)

## 2010-09-18 MED ORDER — IOHEXOL 300 MG/ML  SOLN
100.0000 mL | Freq: Once | INTRAMUSCULAR | Status: AC | PRN
  Administered 2010-09-18: 100 mL via INTRAVENOUS

## 2010-09-18 NOTE — Telephone Encounter (Signed)
Patient scheduled on 09/18/10 at 2:00 PM with Dr. Juanda Chance. Patient aware.

## 2010-09-18 NOTE — Patient Instructions (Signed)
You are being admitted to [facility]. Please go to admitting at [facility].

## 2010-09-18 NOTE — Telephone Encounter (Signed)
Message copied by Jesse Fall on Fri Sep 18, 2010 11:18 AM ------      Message from: [name]      Created: Fri Sep 18, 2010 10:57 AM       Please add pt on to my schedule today.
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
## 2010-09-20 DIAGNOSIS — R933 Abnormal findings on diagnostic imaging of other parts of digestive tract: Secondary | ICD-10-CM

## 2010-09-20 DIAGNOSIS — R1032 Left lower quadrant pain: Secondary | ICD-10-CM

## 2010-09-20 DIAGNOSIS — K5732 Diverticulitis of large intestine without perforation or abscess without bleeding: Secondary | ICD-10-CM

## 2010-09-20 LAB — BASIC METABOLIC PANEL
CO2: 26 mEq/L (ref 19–32)
Calcium: 8.9 mg/dL (ref 8.4–10.5)
Chloride: 107 mEq/L (ref 96–112)
GFR calc Af Amer: >60 mL/min (ref >60)
Sodium: 138 mEq/L (ref 135–145)

## 2010-09-20 LAB — CBC
Hemoglobin: 12 g/dL (ref 12.0–15.0)
MCHC: 33.8 g/dL (ref 30.0–36.0)
RBC: 3.78 MIL/uL — ABNORMAL LOW (ref 3.87–5.11)

## 2010-09-20 LAB — DIFFERENTIAL
Basophils Absolute: 0 10*3/uL (ref 0.0–0.1)
Lymphocytes Relative: 27 % (ref 12–46)
Monocytes Relative: 14 % — ABNORMAL HIGH (ref 3–12)

## 2010-09-21 ENCOUNTER — Inpatient Hospital Stay (HOSPITAL_COMMUNITY): Payer: 59

## 2010-09-21 ENCOUNTER — Telehealth: Payer: Self-pay | Admitting: *Deleted

## 2010-09-21 DIAGNOSIS — R933 Abnormal findings on diagnostic imaging of other parts of digestive tract: Secondary | ICD-10-CM

## 2010-09-21 DIAGNOSIS — K5732 Diverticulitis of large intestine without perforation or abscess without bleeding: Secondary | ICD-10-CM

## 2010-09-21 DIAGNOSIS — K5720 Diverticulitis of large intestine with perforation and abscess without bleeding: Secondary | ICD-10-CM

## 2010-09-21 MED ORDER — IOHEXOL 300 MG/ML  SOLN
100.0000 mL | Freq: Once | INTRAMUSCULAR | Status: AC | PRN
  Administered 2010-09-21: 100 mL via INTRAVENOUS

## 2010-09-21 NOTE — Telephone Encounter (Signed)
Left a message for Rose to call me back to schedule CT abd/pelvis with contrast f/u diverticulitis with contained perforation as per Mike Gip, PA

## 2010-09-21 NOTE — Telephone Encounter (Signed)
Spoke with Rose at [place] [facility] and scheduled patient for  10/05/10 at 9AM  For CT abd/pelvis with contrast.

## 2010-09-21 NOTE — Telephone Encounter (Signed)
Pharmacy called re: metronidazole bid or tid (500mg ) - tid was written onto printed Rx with bid originally that must have been iven at dc I authorized tid

## 2010-09-22 ENCOUNTER — Telehealth: Payer: Self-pay | Admitting: *Deleted

## 2010-09-22 NOTE — Telephone Encounter (Signed)
Spoke with patient gave her appointment date and time for MD visit and CT scan. Patient states she had a CT scan last night per Dr. Zachery Dakins. Spoke with Mike Gip, PA and told her patient had a CT scan last night. Per Mike Gip, PA cancel the CT scan on 10/05/10 but keep MD appointment.  Patient notified. [place] CT notified of cancellation(Rose)

## 2010-09-28 ENCOUNTER — Telehealth: Payer: Self-pay | Admitting: Internal Medicine

## 2010-09-28 NOTE — Telephone Encounter (Signed)
Patient was referred to the surgeon while she was inpateint last weekend.  Her appt with [facility] is on 10/02/10, she wants to wait to go see the surgeon until she comes in and sees Dr Juanda Chance on 10/06/10.  I have advised the patient that is fine she will contact [facility] to reschedule

## 2010-10-02 ENCOUNTER — Other Ambulatory Visit: Payer: Self-pay | Admitting: Internal Medicine

## 2010-10-05 ENCOUNTER — Other Ambulatory Visit: Payer: 59

## 2010-10-06 ENCOUNTER — Ambulatory Visit (INDEPENDENT_AMBULATORY_CARE_PROVIDER_SITE_OTHER): Payer: 59 | Admitting: Internal Medicine

## 2010-10-06 ENCOUNTER — Encounter: Payer: Self-pay | Admitting: Internal Medicine

## 2010-10-06 VITALS — BP 128/64 | HR 62 | Ht 62.0 in | Wt 152.0 lb

## 2010-10-06 DIAGNOSIS — K2270 Barrett's esophagus without dysplasia: Secondary | ICD-10-CM

## 2010-10-06 DIAGNOSIS — R933 Abnormal findings on diagnostic imaging of other parts of digestive tract: Secondary | ICD-10-CM

## 2010-10-06 DIAGNOSIS — K5732 Diverticulitis of large intestine without perforation or abscess without bleeding: Secondary | ICD-10-CM

## 2010-10-06 MED ORDER — FLUCONAZOLE 100 MG PO TABS: ORAL_TABLET | ORAL | Status: DC

## 2010-10-06 MED ORDER — PEG-KCL-NACL-NASULF-NA ASC-C 100 G PO SOLR: 1.0000 | Freq: Once | ORAL | Status: AC

## 2010-10-06 NOTE — Progress Notes (Signed)
Carmen Green Oct 03, 1950 MRN [id number]    History of Present Illness:  This is a 60 year old white female who is status post hospitalization for perforated diverticulitis. She is doing well. She was admitted on September 18, 2010 with a CT scan of the abdomen showing a 5 mm right long base nodule. It also showed sigmoid thickening and inflammatory changes consistent with diverticulitis with focal collection of 3.2x2.3 cm containing debris, gas and contrast material. The findings were concerning for focal perforation. There was no drainable abscess. She did well on IV antibiotics and subsequently oral antibiotics and is currently asymptomatic. She is having regular bowel movements. There is a history of Barrett's esophagus. She is due for a repeat upper endoscopy in January 2012. She has a history of an esophageal stricture which was dilated in January 2010 to 17 mm. Her last colonoscopy in May 2006 showed a tortuous colon.   Past Medical History  Diagnosis Date  . Hypertension   . Hx of appendectomy   . Pacemaker   . Hyperlipidemia   . AV block     with St. Jude dual chamber PCM  . Mitral valve prolapse   . Anxiety   . Hiatal hernia   . GERD (gastroesophageal reflux disease)   . Colon polyp   . Esophagitis   . Diverticulitis    Past Surgical History  Procedure Date  . Left ovary cyst removal 1972  . Pacemaker insertion 1992, 2005    2005- replaced pacemaker  . Tubal ligation   . Colonoscopy 10/19/2004    [facility]    reports that she has never smoked. She has never used smokeless tobacco. She reports that she does not drink alcohol or use illicit drugs. family history includes Diabetes in her mother and Heart disease in her maternal aunt and maternal grandmother.  There is no history of Colon cancer. Allergies  Allergen Reactions  . Cephalexin   . Penicillins         Review of Systems: Denies dysphagia chest pain or shortness of breath. Negative for rectal bleeding diarrhea or  constipation.  The remainder of the 10  point ROS is negative except as outlined in H&P   Physical Exam: General appearance  Well developed, in no distress. Eyes- non icteric. HEENT nontraumatic, normocephalic. Mouth no lesions, tongue papillated, no cheilosis. Neck supple without adenopathy, thyroid not enlarged, no carotid bruits, no JVD. Lungs Clear to auscultation bilaterally. Cor normal S1 normal S2, regular rhythm , no murmur,  quiet precordium. Abdomen soft abdomen, nondistended. Normoactive bowel sounds. No focal tenderness or fullness.  Extremities no pedal edema. Skin no lesions. Neurological alert and oriented x 3. Psychological normal mood and affect.  Assessment and Plan:  Problems #1 status post diverticulitis with focal perforation. Patient is doing well. I have discussed the possibility of sigmoid resection. She would like to hold off on that for now. We have discussed the possibility of recurrent diverticulitis. She will start on Benefiber one heaping teaspoon daily and a high fiber diet. She is due for a colonoscopy which will be done within the next 6 weeks.  Problem #2 Barrett's esophagus. Patient's last endoscopy was in January 2010. A repeat colonoscopy will be due in January 2012. We will schedule her for an upper endoscopy the same day as the colonoscopy in June 2012. She is to continue Nexium 40 mg daily.    10/06/2010 [name]

## 2010-10-06 NOTE — Patient Instructions (Signed)
You have been scheduled for an endoscopy and colonoscopy. Please follow the written instructions given to you at your visit today. Please pick up your Moviprep Kit at the pharmacy within the next 2-3 days. Please pick up your Diflucan at the pharmacy. You will take 1 tablet by mouth once daily x 5 days.

## 2010-10-07 ENCOUNTER — Encounter: Payer: Self-pay | Admitting: Internal Medicine

## 2010-10-22 NOTE — Consult Note (Signed)
NAME:  Carmen Green, Carmen Green              ACCOUNT NO.:  [id number]  MEDICAL RECORD NO.:  [id number]           PATIENT TYPE:  I  LOCATION:  1302                         FACILITY[facility]  PHYSICIAN:  Anselm Pancoast. Zachery Dakins, M.D.DATE OF BIRTH:  January 30, 1951  DATE OF CONSULTATION:  09/21/2010 DATE OF DISCHARGE:  09/21/2010                                CONSULTATION   REQUESTING PHYSICIAN:  [name], M.D.  CONSULTING SURGEON:  Anselm Pancoast. Zachery Dakins, M.D.  REASON FOR CONSULTATION:  Diverticulitis.  HISTORY OF PRESENT ILLNESS:  Ms. Hinkle is a pleasant 60 year old female who was admitted for new-onset diverticulitis.  She had complained of abdominal pain and was seen by Gastroenterology and subsequently admitted and worked up for diverticulitis.  She had a CT scan that showed evidence of wall thickening of the sigmoid colon consistent with diverticulitis in an adjacent area measuring about 3 x 2 cm with gas and debris suggestive of abscess.  The patient had been admitted and placed on IV antibiotics and was actually improving quite well on conservative management.  The Gastroenterology Service asked [place] to evaluate the patient for consultation as the patient is likely to need surgical intervention in the future.  PAST MEDICAL HISTORY:  The patient does have a history of AV block with a St. Jude pacemaker, history of mitral valve prolapse with a most recent ejection fraction 55% to 60%, history of hyperlipidemia, hypertension, anxiety, esophageal reflux disease with hiatal hernia, history of colonic polyps.  PAST SURGICAL HISTORY:  As noted in the past medical history.  No abdominal surgery previous.  SOCIAL HISTORY:  The patient has a history of diabetes on her mother side as well as heart disease.  SOCIAL HISTORY:  The patient denies any alcohol, tobacco or illicit drug use.  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  REVIEW OF SYSTEMS:  Please see history  of present illness for pertinent findings.  ALLERGIES:  Include KEFLEX and PENICILLIN.  CURRENT MEDICATIONS:  Include BuSpar, Wellbutrin, lisinopril/HCTZ, Crestor, Prempro, Nexium, multivitamin, Zyrtec, fish oil, Astepro, lorazepam and Mucinex p.r.n.  She is currently on Cipro and Flagyl IV.  PHYSICAL EXAMINATION:  GENERAL:  Physical exam reveals 60 year old thin Caucasian female, nontoxic appearing, otherwise not in distress. VITAL SIGNS:  Current vital signs showed temperature of 98.8, heart rate of 72, respiratory rate of 16, blood pressure 130/75, oxygen saturation 96% on room air. ENT:  Unremarkable. NECK:  Supple without lymphadenopathy. LUNGS:  Clear to auscultation.  No wheezes, rhonchi or rales.  Normal respiratory effort without use of accessory muscles. HEART:  Paced rhythm.  No murmurs or rubs.  Peripheral pulses intact and symmetrical. ABDOMEN:  Soft, nondistended.  No mass effect is appreciated.  Minimal tender in the left lower quadrant. RECTAL:  Exam is deferred. EXTREMITIES:  Good active range of motion of all extremities without grimace or pain.  Normal muscle strength and tone without atrophy. SKIN:  Otherwise warm and dry with good turgor.  No rash, lesions, or nodules. NEUROLOGIC:  The patient is alert and oriented x3.  LABORATORY DATA:  Most recent labs show white blood cell count of 3.5,  decreased from 12.4 on admission.  Hemoglobin of 12.0, hematocrit of 35.5, platelet count of 265.  Metabolic panel showed sodium of 138, potassium of 4.0, chloride of 107, CO2 of 26, BUN of 3, creatinine of 0.8, glucose of 142.  IMAGING:  CT scan on September 18, 2010, showed evidence of sigmoid diverticulitis with focal collection approximately 3 x 2 cm in size containing gas and debris consistent with abscess.  IMPRESSION: 1. Acute sigmoid diverticulitis with contained abscess from probable     microperforation. 2. Multiple medical comorbidities as mentioned in history  of present     illness and past medical history.  RECOMMENDATIONS:  After review of the films with the radiology team, it was suggested that CT scan could be repeated with the thought that if the abscess had increased in size, this could possibly be amenable to percutaneous drain.  Therefore we will order a repeat CT scan with Interventional Radiology to look at the films to see if percutaneous drain can be achieved if the abscess is no better or enlarged.  Would otherwise agree with current discharge plan for gastroenterology with followup in our clinic electively.     Brayton El, PA-C   ______________________________ Anselm Pancoast. Zachery Dakins, M.D.    KB/MEDQ  D:  09/22/2010  T:  09/22/2010  Job:  [id number]  Electronically Signed by Brayton El  on 10/12/2010 02:09:26 PM Electronically Signed by Consuello Bossier M.D. on 10/22/2010 11:49:11 AM

## 2010-11-03 NOTE — Assessment & Plan Note (Signed)
[facility]                            CARDIOLOGY OFFICE NOTE   NAME:Carmen Green, Carmen Green                     MRN:          [id number]  DATE:11/02/2007                            DOB:          04-03-1951    PRIMARY CARE PHYSICIAN:  [name], M.D. and Prudy Feeler, P.A. in  [place].   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  She returns for followup management  of her pacemaker which was implanted for complete AV block.  We had done  a followup echocardiogram on her in March of this year which showed an  ejection fraction which was reduced to 40% from a previous level that  was normal.  She complained of increased symptoms of fatigue.  She was  very concerned about the fall in the ejection fraction and the reason  for it.  She has had no chest pain or recent palpitations.   PAST MEDICAL HISTORY:  1. Hypertension.  2. Hyperlipidemia.   CURRENT MEDICATIONS:  1. Multivitamins.  2. Nexium.  3. Crestor 10 mg q.h.s.  4. Prempro 0.3/1.5 daily.  5. Lisinopril/hydrochlorothiazide 20/12.5 daily.  6. Buspirone 10 mg 2-3 daily.  7. Biotin 1000 mg daily.  8. Alprazolam 0.5 mg q.h.s.  9. Wellbutrin 300 mg daily.   SOCIAL HISTORY:  She works in an Biochemist, clinical.  She has been under  a great deal of stress at work.   PHYSICAL EXAMINATION:  VITAL SIGNS:  Blood pressure was 137/79, pulse 79  and regular.  NECK:  There was no venous tension.  The carotid pulses were full  without bruits.  CHEST:  Clear.  HEART:  The cardiac rhythm was regular.  I heard no murmurs or gallops.  ABDOMEN:  Soft with normal bowel sounds.  There is no  hepatosplenomegaly.  EXTREMITIES:  Peripheral pulses were full.  There was no peripheral  edema.   We interrogated her pacemaker.  She had no mode switches.  She has  atrial sensing and ventricular pacing almost all the time.   IMPRESSION:  1. Left  ventricular dysfunction with ejection fraction of 40% of      uncertain etiology.  2. Symptoms of fatigue.  3. Status post St. Jude Identity DDD pacemaker implantation for      complete heart block.  4. Hypertension, treated.  5. Hyperlipidemia, treated.  6. Positive family history for vascular disease.  7. Mitral valve prolapse.  8. Situational stress.   RECOMMENDATIONS:  Carmen Green has worse LV dysfunction by  echocardiography, and the reason is not clear.  She does have a fairly  high risk profile for ischemic heart disease.  Considering her concern  and her symptoms of fatigue, I think it is best to proceed with  evaluation, and I will arrange for her to have a right and left heart  catheterization and coronary angiography.  The [facility] lab is full, and we  will plan to do her in the upstairs lab tomorrow.  Will make a decision  about further management after we have the  results of her tests.     Bruce Elvera Lennox Carmen Chance, MD, [facility]  Electronically Signed    BRB/MedQ  DD: 11/02/2007  DT: 11/02/2007  Job #: [id number]

## 2010-11-03 NOTE — Assessment & Plan Note (Signed)
[facility]                            CARDIOLOGY OFFICE NOTE   NAME:Carmen Green, Carmen Green                     MRN:          [id number]  DATE:05/30/2008                            DOB:          [birth year]    PRIMARY CARE PHYSICIAN:  Dr. [name] in [place].   CLINICAL HISTORY:  Carmen Green returns for followup management of her  pacemaker and cardiomyopathy.  She was found to have a reduced ejection  fraction of 40% by echocardiogram earlier this year and underwent  catheterization, which found to have normal coronary arteries and her LV  function appeared to be 45-50%.  She did have some asynchrony.  We did  another echo prior to this visit and her ejection fraction was up to  60%.   She states she still gets some occasional chest pains and she gives out  really easily and is tired at the end of the day.   She indicates she has been under quite a bit of stress at work.  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   PAST MEDICAL HISTORY:  Significant for hypertension and hyperlipidemia.   CURRENT MEDICATIONS:  1. Multivitamins.  2. Nexium.  3. Crestor.  4. Empro.  5. BuSpar.  6. Wellbutrin.  7. Fish oil.  8. Trazodone.  9. Lisinopril 20 mg daily.   PHYSICAL EXAMINATION:  VITAL SIGNS:  Blood pressure is 134/75 and her  pulse 71 and regular.  NECK:  There is no venous distension.  The carotid pulses were full  without bruits.  CHEST:  Clear.  CARDIAC:  Rhythm was regular.  There were no murmurs or gallops.  ABDOMEN:  Soft, normal bowel sounds.  EXTREMITIES:  Peripheral pulses are full.  There is no peripheral edema.   We interrogated her pacemaker and checked good thresholds on both  channels.  She is pacer dependent.  She is pacing the ventricle all the  time and atrium less than 1% of the time.  She had 4 mode switches, all  less than 1 minute.   IMPRESSION:  1. Previous left ventricular  dysfunction of uncertain etiology, now      normalized.  2. Normal coronary angiography.  3. Status post St. Jude dual-chamber pacemaker for atrioventricular      block with good pacer function - pacer dependent.  4. Hypertension.  5. Hyperlipidemia.  6. Positive family history for vascular disease.  7. Mitral valve prolapse.  8. Situational stress.   RECOMMENDATIONS:  I think Ms. Teffeteller is doing much better from the  standpoint of her heart.  She states she is under a great deal of stress  at work.  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  She asked for H1N1, I encouraged her to get  that.  We will plan to see her back and followup in 1 year.     Bruce Elvera Lennox Juanda Chance, MD, [facility]  Electronically Signed    BRB/MedQ  DD: 05/30/2008  DT: 05/31/2008  Job #: [id number]

## 2010-11-03 NOTE — Assessment & Plan Note (Signed)
[facility]                            CARDIOLOGY OFFICE NOTE   NAME:Green, Carmen TWEEDY                     MRN:          [id number]  DATE:12/05/2007                            DOB:          May 16, 1951    PRIMARY CARE PHYSICIAN:  [name], MD, and Prudy Feeler, PA, in  [place].   HISTORY OF PRESENT ILLNESS:  [name] returned following her  recent catheterization.  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  She was recently found to have a reduced ejection  fraction of 40% by echocardiogram and we brought her in for  catheterization.  At catheterization, her coronary arteries were normal  and her LV function did not look quite as bad.  I do not have her  complete records, but by my recollection we estimated 45-50% with some  asynchrony related to her pacemaker.   She has felt fine since that time.  She is back to work.  She still does  have a fair amount of stress at work.   PAST MEDICAL HISTORY:  Significant for hypertension and hyperlipidemia.   CURRENT MEDICATIONS:  1. Multivitamins.  2. Nexium.  3. Crestor 10 mg daily.  4. Prempro.  5. Lisinopril/hydrochlorothiazide 20/12.5 daily.  6. BuSpar.  7. Alprazolam.   PHYSICAL EXAMINATION:  VITAL SIGNS:  Blood pressure 127/73.  The pulse  was 80 and regular.  NECK:  There was no venous distention.  The carotid pulses were full  without bruits.  CHEST:  Clear.  HEART:  Rhythm was regular.  No murmurs or gallops.  ABDOMEN:  Soft.  Normal bowel sounds.  Peripheral pulses are full with  no peripheral edema.   An electrocardiogram showed atrial tracking and ventricular pacing.   IMPRESSION:  1. Mild left ventricular dysfunction.  2. Normal coronary angiography.  3. Status post St. Jude and dual-mode, dual-pacing, dual-sensing      pacemaker implanted for a complete atrioventricular block.  4. Hypertension.  5. Hyperlipidemia.  6. Positive family  history of vascular disease.  7. Mitral valve prolapse.  8. Situational stress.   RECOMMENDATIONS:  [name] is doing well.  Her LV function was only  very mild at catheterization, so I am less concerned about this.  I will  plan to continue her current treatment.  I will see her in 6 months.  We  will plan to get an echocardiogram just prior to that visit.     Bruce Elvera Lennox Juanda Chance, MD, [facility]  Electronically Signed    BRB/MedQ  DD: 12/05/2007  DT: 12/06/2007  Job #: [id number]

## 2010-11-03 NOTE — Cardiovascular Report (Signed)
Carmen Green, Carmen Green              ACCOUNT NO.:  [id number]   MEDICAL RECORD NO.:  [id number]          PATIENT TYPE:  OIB   LOCATION:  2870                         FACILITY:  [facility]   PHYSICIAN:  Everardo Beals. Juanda Chance, MD, FACCDATE OF BIRTH:  05-22-1951   DATE OF PROCEDURE:  11/03/2007  DATE OF DISCHARGE:  11/03/2007                            CARDIAC CATHETERIZATION   CLINICAL HISTORY:  Ms. 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  She has a pacemaker for AV block.  She was  recently found to have a reduced ejection fraction as well and for  evaluation of cardiac catheterization.   PROCEDURE:  Procedure was performed via the right femoral artery using  arterial sheath and 4-French preformed coronary catheters.  A front wall  arterial advance was performed and Omnipaque contrast was used.  The  patient tolerated the procedure well and left laboratory in satisfactory  condition.   RESULTS:  Left main coronary:  The left main coronary artery was free of  disease.   Left anterior descending artery:  The left anterior descending artery  gave rise to diagonal branch and septal perforator.  These and LV proper  were free of significant disease.   The circumflex artery:  The circumflex artery gave rise to a marginal  branch and two posterolateral branches.  These vessels were free of  significant disease.   The right coronary artery:  The right coronary artery was a moderately  large vessel that gave rise to posterior branch and two posterolateral  branches.  These vessels were free of disease.   The left ventriculogram:  The left ventriculogram performed in the RAO  projection showed some asynchronous contraction due to the paced rhythm.  Ejection fraction was estimated at 45%.   CONCLUSION:  1. Normal coronary angiography.  2. Asynchronous contraction of the left ventricle due paced rhythm      with ejection fraction of 45%.   RECOMMENDATIONS:   Reassurance.      Bruce Elvera Lennox Juanda Chance, MD, [facility]  Electronically Signed     BRB/MEDQ  D:  12/12/2007  T:  12/13/2007  Job:  [id number]

## 2010-11-03 NOTE — Assessment & Plan Note (Signed)
[facility]                            CARDIOLOGY OFFICE NOTE   NAME:Robley, ETHELLE OLA                     MRN:          [id number]  DATE:02/10/2007                            DOB:          08-Nov-1950    PRIMARY CARE PHYSICIAN:  Dr. [name] and Prudy Feeler, PA in  [place].   CLINICAL HISTORY:  Ms. 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  She returned for followup and  management of her pacemaker which is implanted for complete heart block.  She has had a generator change, and has a Corporate treasurer in place.   Several months ago, she was having increased symptoms of palpitations  and she talked to Leotis Shames, Dr. Rosalyn Charters nurse, and cut back on the  caffeine.  This improved.  She has had no chest pain, shortness of  breath, and only minimal palpitations now.  However, she has had a great  deal of difficulty sleeping, and she said she has been under a great  deal of stress.  She lost her stepfather, and her orthopedist's office  is embarking on electronic medical record system.   PAST MEDICAL HISTORY:  1. Hypertension.  2. Hyperlipidemia.   CURRENT MEDICATIONS:  1. Multivitamins.  2. Nexium.  3. Crestor.  4. Lisinopril/hydrochlorothiazide.  5. BuSpar.  6. Biotin.   PHYSICAL EXAMINATION:  The blood pressure is 130/81, the pulse 79 and  regular.  There was no venous distension.  Carotid pulses were full without  bruits.  Chest was clear without rales or rhonchi.  The cardiac rhythm with as regular and I could hear no murmurs or  gallops or clicks.  The abdomen was soft without organomegaly.  Peripheral pulses were full with no peripheral edema.   We interrogated her pacemaker.  She had good thresholds on both leads.  She was not quite pacer dependent.  Underlying rhythm is complete heart  block but she had an escape.  She is pacing the ventricle most of the  time and pacing the atrium very  little.   IMPRESSION:  1. Status post St. Jude Identity __________ pacemaker implantation for      complete heart block with good pacer function that is not quite      pacer dependent.  2. Hypertension, treated.  3. Hyperlipidemia, treated.  4. Positive family history for vascular disease.  5. Mitral valve prolapse.  6. Anxiety and situational stress.   RECOMMENDATIONS:  Mrs. Mccalip is doing well from the standpoint of her  heart.  We gave her Ambien 5 for sleep.  I will ask her to follow up  with Dr. Charm Barges and Prudy Feeler for long-term management of that.  I  will plan to see her back for pacer followup in a year.     Bruce Elvera Lennox Juanda Chance, MD, [facility]  Electronically Signed    BRB/MedQ  DD: 02/10/2007  DT: 02/11/2007  Job #: [id number]   cc:   Jolayne Panther

## 2010-11-05 NOTE — H&P (Signed)
NAME:  Carmen Green, Carmen Green              ACCOUNT NO.:  [id number]  MEDICAL RECORD NO.:  [id number]           PATIENT TYPE:  I  LOCATION:  1302                         FACILITY:  [facility]  PHYSICIAN:  Hedwig Morton. Juanda Chance, MD     DATE OF BIRTH:  04-08-1951  DATE OF ADMISSION:  09/18/2010 DATE OF DISCHARGE:  09/21/2010                             HISTORY & PHYSICAL   CHIEF COMPLAINT:  Abdominal pain.  HISTORY:  [name] is a 60 year old white female known to Dr. [name], who presented with a 3-day history of lower abdominal pain which started in the left lower quadrant and is now radiating across to the right lower quadrant.  She reports increased pain with walking and evacuating stool.  She had some low-grade temperature.  She was started on an outpatient basis on Cipro 500 mg twice daily and was then scheduled for a CT scan of the abdomen and pelvis, which was done on the day of admission.  This shows inflammatory changes of the sigmoid colon with a contained perforation 3 x 3 cm in diameter.  There was no free air. Labs done in the office showed WBC of 12,000.  Her last colonoscopy was done in 2006 showing diverticulosis of the left colon.  She also has history of GERD and an esophageal stricture.  She does have a pacemaker for history of AV block.  She was seen and evaluated in the office by Dr. [name] on the day of admission and decision was made to admit her to the hospital for bowel rest and IV antibiotics.  MEDICATIONS: 1. Astelin 137 mcg nasal spray daily. 2. Wellbutrin XL 300 mg daily. 3. BuSpar 10 mg 3 times a day. 4. Zyrtec 10 mg daily. 5. Nexium 40 mg once or twice daily. 6. Prempro 1 daily. 7. Diflucan 100 mg daily. 8. Mucinex 600 mg q.12 h. 9. Prinzide 20/12.5 daily. 10.Ativan 0.5 mg p.r.n. q.h.s. 11.Multivitamin daily. 12.Omega-3 supplement daily. 13.B6 daily. 14.Crestor 10 mg daily.  ALLERGIES:  KEFLEX and PENICILLIN.  PAST MEDICAL HISTORY:  Pertinent for: 1.  Hypertension, history of AV block.  She has a pacemaker. 2. Hyperlipidemia. 3. Status post appendectomy. 4. Left ovarian cyst removal. 5. History of colon polyps and diverticulosis. 6. Chronic GERD and history of esophageal stricture. 7. Chronic anxiety. 8. History of mitral valve prolapse.  FAMILY HISTORY:  Pertinent for mother with diabetes.  SOCIAL HISTORY:  The patient is married.  She is a nonsmoker, nondrinker.  REVIEW OF SYSTEMS:  GENERAL:  Benign.  She has had no recent weight loss.  Has had some mild fatigue and low-grade fevers.  CARDIOVASCULAR: Denies any chest pain or anginal symptoms.  She does have a pacemaker. PULMONARY:  Denies any cough, shortness of breath or sputum production. URINARY:  Denies any dysuria, urgency or frequency currently.  GI:  As outlined above.  MUSCULOSKELETAL:  Denies.  NEUROLOGIC:  Pertinent for chronic problems with anxiety.  BREASTS:  Negative.  ENDOCRINE: Negative.  PSYCH:  Negative.  All other review of systems negative except as in the HPI.  PHYSICAL EXAMINATION:  GENERAL:  Well-developed white female in no  acute distress, alert and oriented x3. VITAL SIGNS:  Blood pressure 122/70, pulse is 80, she was afebrile on admission. HEENT:  Nontraumatic, normocephalic.  EOMI, PERRLA.  Sclerae anicteric. NECK:  Supple and without adenopathy.  Thyroid not enlarged.  No JVD. LUNGS:  Clear bilaterally. CARDIOVASCULAR:  Regular rate and rhythm with S1 and S2.  No murmur, rub or gallop. ABDOMEN:  Soft with decreased bowel sounds.  There is marked tenderness diffusely, more so in the left and right lower quadrant with early rebound.  No rigidity.  Liver edge palpable at the costal margin.  No ascites. RECTAL:  Soft Hemoccult-negative stool.  Mild discomfort to pressure in the rectum. EXTREMITIES:  No clubbing, cyanosis or edema. SKIN:  No lesions. NEUROLOGIC:  Alert and oriented x3. PSYCH:  Normal mood and affect.  IMPRESSION: 62. A  60 year old white female with 3-day history of acute lower     abdominal pain and CT consistent with acute diverticulitis, sigmoid     with contained perforation of 3 x 3 cm diameter 2. Gastroesophageal reflux disease. 3. History of diminutive colon polyps. 4. History of atrioventricular block status post pacemaker placement. 5. Chronic anxiety.  PLAN:  The patient is admitted to the service of Dr. [name] for IV fluid hydration, IV antibiotics, bowel rest, pain control and for further details, please see the orders.     Amy Esterwood, PA-C   ______________________________ Hedwig Morton. Juanda Chance, MD    AE/MEDQ  D:  10/30/2010  T:  10/30/2010  Job:  [id number]  Electronically Signed by AMY ESTERWOOD PA-C on 11/02/2010 12:55:58 PM Electronically Signed by [name] MD on 11/05/2010 09:50:51 PM

## 2010-11-06 NOTE — Assessment & Plan Note (Signed)
[facility]                              CARDIOLOGY OFFICE NOTE   NAME:Carmen Green, Carmen Green                     MRN:          [id number]  DATE:01/19/2006                            DOB:          04-06-1951    PRIMARY CARE PHYSICIAN:  Dr. 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  She has a pacemaker for a complete heart block and she also has  hypertension and hyperlipidemia.  We evaluated her last year for an episode  of chest pain.  She had a negative Cardiolite scan and she was started on  Crestor for her hyperlipidemia.  She does have a fairly high risk profile  with hypertension, hyperlipidemia, and a positive family history for  vascular disease.   She says she has been doing quite well.  She had a couple episodes of  shortness of breath but she relates this to stress.  She has had no  palpitations and no chest pain.   Her past medical history is significant for hypertension, hyperlipidemia.  Her current medications include lisinopril/hydrochlorothiazide, Nexium,  BuSpar, Crestor, __________, and Lexapro.   On examination blood pressure 122/80.  There was no venous distention.  The  carotid pulses were full and there were no bruits.  The chest was clear  without rales or rhonchi.  The cardiac rhythm was regular.  There was a  grade 2-3/6 pan systolic murmur at the left sternal edge which was most  prominent in late systole.  The abdomen was soft without organomegaly.  Peripheral pulses were full and there was no peripheral edema.   An ECG showed atrial tracking with ventricular pacing.  We interrogated her  pacemaker and she is TV pacing all of the time.  She had good lead  impedances and good thresholds in both leads.  She was pacer dependent.   IMPRESSION:  1.  Complete heart block status post DDD pacemaker  implantation with an      Identity DDD pacemaker with a St. Jude Identity DDD pacemaker with good      pacer function - pacer dependent.  2.  Hypertension under good control.  3.  Hyperlipidemia under good control.  4.  Positive family history for vascular disease.   RECOMMENDATIONS:  I think Ms. Fullington is doing well.  Will plan to continue  her same medications.  She does have a murmur of what sounds like mitral  regurgitation, had an echocardiogram performed in 1992 which showed mild  mitral prolapse.  Will plan to repeat the  echocardiogram to reevaluate her mitral regurgitation and mitral valve  prolapse.  I will see her back in follow-up in a year.                               Bruce Elvera Lennox Juanda Chance, MD, [facility]    BRB/MedQ  DD:  01/19/2006  DT:  01/19/2006  Job #:  [id number]

## 2010-11-06 NOTE — Cardiovascular Report (Signed)
NAME:  Carmen Green, Carmen Green                        ACCOUNT NO.:  [id number]   MEDICAL RECORD NO.:  [id number]                   PATIENT TYPE:  OIB   LOCATION:  2899                                 FACILITY:  [facility]   PHYSICIAN:  Charlies Constable, M.D.                  DATE OF BIRTH:  12-23-1950   DATE OF PROCEDURE:  06/19/2003  DATE OF DISCHARGE:                              CARDIAC CATHETERIZATION   CLINICAL HISTORY:  [name] is 60 years old and had a Paragon II  generator implanted for complete heart block in 1992.  She has done well  since that time, but recently reached elective replacement indexes for her  pacemaker and was scheduled to come in for elective generator change.  Her  battery voltage was under 2.5 volts.   PROCEDURE:  Explantation of old Paragon II DD pacemaker (model [id number] serial  [id number]), inspection of the old atrial lead (model [id number] serial [id number]  implanted September 07, 1990), and the old ventricular lead (model [id number] serial  [id number], date of implantation September 07, 1990), and implantation of a new  Identity XL DR DDD pacemaker (model [id number] serial [id number], [employer]).   ANESTHESIA:  1% local Xylocaine.   ESTIMATED BLOOD LOSS:  Less than 30 mL.   COMPLICATIONS:  None.   PROCEDURE:  The procedure was performed in the EP room.  We initially placed  a temporary pacemaker via the left femoral vein since the patient was pacer  dependent.  We then prepped the right anterior chest in the usual fashion.  The skin and subcutaneous tissue were anesthetized with 1% local Xylocaine.  An incision was made over the pocket and extended to the pacemaker pocket.  The pocket was opened and the leads were inspected and interrogated with  good parameters as described below.  The pocket was irrigated with sterile  kanamycin solution.  The leads were attached to the new generator and the  generator was implanted in the pocket and secured loosely to the posterior  aspect of the  pocket with 1-0 silk.  Subcutaneous tissue was closed with  running 2-0 Dexon.  The skin was closed with running 5-0 Dexon.   Pacing parameters:  Atrial unipolar:  P-wave 2.6 millivolts.  Minimal  threshold capture 1.5 volts and resistance 563 ohms.   Atrial bipolar:  P-wave 2.9 millivolts.  Minimal threshold capture 1.6  volts, resistance 734 ohms.   Ventricular unipolar:  Impedance 520 ohms.  Threshold 0.8 volts at 0.5  milliseconds pulse width.   Bipolar:  Impedance 654 ohms.  Threshold 1.0 volts at 0.5 milliseconds pulse  width.   The patient tolerated procedure well and left the laboratory in satisfactory  condition.  Charlies Constable, M.D.    BB/MEDQ  D:  06/19/2003  T:  06/19/2003  Job:  [id number]   cc:   Ernestina Penna, M.D.  [id number] [place]  [place] [address]  Fax: [phone]   [facility]

## 2010-11-17 NOTE — Discharge Summary (Signed)
NAME:  Carmen Green, Carmen Green              ACCOUNT NO.:  [id number]  MEDICAL RECORD NO.:  [id number]           PATIENT TYPE:  I  LOCATION:  1302                         [facility]  PHYSICIAN:  Barbette Hair. Arlyce Dice, MD,FACGDATE OF BIRTH:  September 08, 1950  DATE OF ADMISSION:  09/18/2010 DATE OF DISCHARGE:  09/21/2010                              DISCHARGE SUMMARY   ADMITTING DIAGNOSIS:  A 60 year old female with acute diverticulitis with contained perforation.  DISCHARGE DIAGNOSES: 76. A 60 year old white female with acute sigmoid diverticulitis with     contained perforation.  Repeat CT scan showing marked improvement     and no evidence of abscess. 2. History of supraventricular tachycardia, status post pacemaker     placement. 3. Hypertension.  CONSULTATIONS:  Surgery, Anselm Pancoast. Weatherly, MD  PROCEDURE:  CT abdomen and pelvis.  BRIEF HISTORY:  Carmen Green is a pleasant 60 year old white female known to Dr. [name] who was admitted with a 3-day history of severe lower abdominal pain, associated with low-grade fever and leukocytosis.  She underwent CT scan of the abdomen and pelvis as an outpatient on the day of admission and was found to have an acute sigmoid diverticulitis with evidence of a contained perforation and is admitted for IV antibiotics and pain control.  The patient is generally healthy, though she has had aortic valve replacement remotely.  LABORATORY STUDIES:  On March 30th, WBC is 11.3, hemoglobin 12, hematocrit of 35.1, MCV of 94.1, platelets 249.  Followup on April 1st, WBC of 3.5, hemoglobin 12, hematocrit of 35.5; sed rate 61. Electrolytes on admission showed a potassium of 3.2, BUN 6, creatinine 0.91, potassium was corrected to 4, albumin 3.7.  Liver function studies normal.  TSH of 0.96.  Urinalysis on admission, 0-2 WBCs, 3-6 RBCs, rare bacteria.  X-RAY STUDIES:  CT scan of the abdomen and pelvis done on April 2nd showed lung base nodules, some of which were  calcified similar to prior exam.  Cardiomegaly with a dual lead pacer in place.  The pericolonic inflammatory changes described on prior exam improved and nearly resolved.  There may be minimal residual edema in the sigmoid mesocolon; focus of gas, likely related to a residual diverticulum in this area. No drainable abscess.  There is some eccentric wall thickening in the area of resolving inflammation, likely due to muscular hypertrophy and some evidence of hepatic steatosis.  HOSPITAL COURSE:  The patient was admitted to the service of Dr. [name].  She was placed on a clear liquid diet, IV fluids, and started on IV Flagyl as well as IV Cipro.  She had acute abdominal series done which was negative.  The patient had a very benign hospital course, improved quickly.  By March 31st, she was feeling much better, was hungry, she remained afebrile, and really had very little pain.  She was advanced to full liquids on the 1st and we originally had plans for a followup CT scan in approximately 10 days as long as she continued to improve.  It was suggested that she have surgical consultation prior to discharge and she was seen in consult by Dr. Zachery Dakins for surgery  on the 2nd.  He was concerned that she may have a developing abscess and ordered a CT scan of the abdomen and pelvis to be done on the 2nd.  This did show marked improvement in the diverticulitis and no evidence of fluid collection or abscess.  She was then allowed to discharge home later that evening with instructions to follow up with Dr. Zachery Dakins in his office in approximately 1 week.  She also had a followup appointment with Dr. Juanda Chance on April 17th at 2:30 p.m.  She was to continue Cipro 500 mg b.i.d. for 2 weeks and Flagyl 500 mg twice daily for 2 weeks and Zofran 4 mg every 6 hours as needed for nausea.  CONDITION ON DISCHARGE:  Stable improved.     Carmen Esterwood, PA-C   ______________________________ Barbette Hair  Arlyce Dice, MD,FACG    AE/MEDQ  D:  10/08/2010  T:  10/09/2010  Job:  [id number]  Electronically Signed by Carmen ESTERWOOD PA-C on 10/30/2010 02:32:43 PM Electronically Signed by Melvia Heaps MDFACG on 11/17/2010 10:22:46 AM

## 2010-12-04 ENCOUNTER — Encounter: Payer: Self-pay | Admitting: Internal Medicine

## 2010-12-04 ENCOUNTER — Ambulatory Visit (AMBULATORY_SURGERY_CENTER): Payer: 59 | Admitting: Internal Medicine

## 2010-12-04 DIAGNOSIS — Z86010 Personal history of colonic polyps: Secondary | ICD-10-CM

## 2010-12-04 DIAGNOSIS — D130 Benign neoplasm of esophagus: Secondary | ICD-10-CM

## 2010-12-04 DIAGNOSIS — K5792 Diverticulitis of intestine, part unspecified, without perforation or abscess without bleeding: Secondary | ICD-10-CM

## 2010-12-04 DIAGNOSIS — K2270 Barrett's esophagus without dysplasia: Secondary | ICD-10-CM

## 2010-12-04 DIAGNOSIS — D126 Benign neoplasm of colon, unspecified: Secondary | ICD-10-CM

## 2010-12-04 DIAGNOSIS — D128 Benign neoplasm of rectum: Secondary | ICD-10-CM

## 2010-12-04 DIAGNOSIS — K56609 Unspecified intestinal obstruction, unspecified as to partial versus complete obstruction: Secondary | ICD-10-CM

## 2010-12-04 DIAGNOSIS — K5730 Diverticulosis of large intestine without perforation or abscess without bleeding: Secondary | ICD-10-CM

## 2010-12-04 MED ORDER — SODIUM CHLORIDE 0.9 % IV SOLN: 500.0000 mL | INTRAVENOUS | Status: DC

## 2010-12-04 NOTE — Patient Instructions (Addendum)
Read the handouts given to you by your recovery room nurse,   Your biopsies will be back within two weeks, and we will send you a letter stating when you need to come back and see [place].   You may resume your routine medications today.   If you have any questions or concerns, please call [phone]. Thank-you.

## 2010-12-07 ENCOUNTER — Telehealth: Payer: Self-pay | Admitting: *Deleted

## 2010-12-07 NOTE — Telephone Encounter (Signed)
Follow up Call- Patient questions:  Do you have a fever, pain , or abdominal swelling? no Pain Score  0 *  Have you tolerated food without any problems? yes  Have you been able to return to your normal activities? yes  Do you have any questions about your discharge instructions: Diet   no Medications  no Follow up visit  no  Do you have questions or concerns about your Care? no  Actions: * If pain score is 4 or above: No action needed, pain <4.  Pt states that she did have pretty significant pain Saturday and Sunday until lunch. The pain was primarily on the left side, spontaneously resolved itself, cannot attribute any factors such as passing air to relief. The pain is completely gone now. She is on her way to work, eating normally, bowel movements are normal, no fever.

## 2010-12-09 ENCOUNTER — Encounter: Payer: Self-pay | Admitting: Internal Medicine

## 2010-12-10 ENCOUNTER — Telehealth: Payer: Self-pay | Admitting: Internal Medicine

## 2010-12-10 ENCOUNTER — Encounter: Payer: Self-pay | Admitting: Internal Medicine

## 2010-12-10 NOTE — Telephone Encounter (Signed)
Patient calling to report sharp, pulsating pain in LLQ since lunch today. Denies fever, nausea or vomiting. Feels uncomfortable until she has a bowel movement then feels some better. Spoke with Carmen Cluster, NP- have patient get CBC today or early AM and call in AM with update on how she feels. Patient aware and she will come in AM for labs

## 2010-12-10 NOTE — Progress Notes (Signed)
Please disregard the first EGD letter, the correct letter has a recall EGD in 3 years

## 2010-12-11 ENCOUNTER — Telehealth: Payer: Self-pay | Admitting: *Deleted

## 2010-12-11 ENCOUNTER — Other Ambulatory Visit (INDEPENDENT_AMBULATORY_CARE_PROVIDER_SITE_OTHER): Payer: 59

## 2010-12-11 DIAGNOSIS — R109 Unspecified abdominal pain: Secondary | ICD-10-CM

## 2010-12-11 LAB — CBC WITH DIFFERENTIAL/PLATELET
Basophils Relative: 1 % (ref 0.0–3.0)
Eosinophils Absolute: 0.2 10*3/uL (ref 0.0–0.7)
Eosinophils Relative: 3.4 % (ref 0.0–5.0)
Hemoglobin: 13.3 g/dL (ref 12.0–15.0)
Lymphocytes Relative: 25.7 % (ref 12.0–46.0)
MCHC: 34.5 g/dL (ref 30.0–36.0)
Monocytes Relative: 8.7 % (ref 3.0–12.0)
Neutrophils Relative %: 61.2 % (ref 43.0–77.0)
RBC: 3.97 Mil/uL (ref 3.87–5.11)
WBC: 4.7 10*3/uL (ref 4.5–10.5)

## 2010-12-11 MED ORDER — DICYCLOMINE HCL 10 MG PO CAPS: ORAL_CAPSULE | ORAL | Status: DC

## 2010-12-11 NOTE — Telephone Encounter (Signed)
Addended by: Daphine Deutscher on: 12/11/2010 10:08 AM   Modules accepted: Orders

## 2010-12-11 NOTE — Telephone Encounter (Signed)
Patient states she is 90% better today. Still has the off and on LLQ pulsating pain but not nearly as much as yesterday. Lab results reviewed with Willette Cluster, NP and patient's update. Per Farrel Demark rx and have patient call if further problems. Patient aware.

## 2010-12-11 NOTE — Telephone Encounter (Signed)
Left a message for patient to call me. 

## 2010-12-12 NOTE — Telephone Encounter (Signed)
Carmen Green, please make sure patient still feels okay as far as her LLQ pain. If she has recurrent pain then definitely needs to be seen. Thanks

## 2010-12-14 NOTE — Telephone Encounter (Signed)
I spoke with the patient this am at work..She reports no pain since Saturday.  She knows to call back for any continued pain or symptoms.

## 2010-12-16 ENCOUNTER — Telehealth: Payer: Self-pay | Admitting: Internal Medicine

## 2010-12-16 NOTE — Telephone Encounter (Signed)
Pt had questions regarding the letter she received following her colon. Discussed results with pt and all questions were answered.

## 2010-12-21 ENCOUNTER — Telehealth: Payer: Self-pay | Admitting: *Deleted

## 2010-12-21 NOTE — Telephone Encounter (Signed)
Patient notified of results as per Willette Cluster, NP

## 2010-12-21 NOTE — Telephone Encounter (Signed)
Message copied by Daphine Deutscher on Mon Dec 21, 2010  1:48 PM ------      Message from: Meredith Pel      Created: Mon Dec 21, 2010  9:07 AM       Rene Kocher, please let patient know labs are normal. Thanks

## 2010-12-24 ENCOUNTER — Other Ambulatory Visit: Payer: Self-pay | Admitting: Nurse Practitioner

## 2011-01-04 ENCOUNTER — Encounter: Payer: Self-pay | Admitting: Internal Medicine

## 2011-01-04 DIAGNOSIS — I442 Atrioventricular block, complete: Secondary | ICD-10-CM

## 2011-01-12 ENCOUNTER — Encounter: Payer: Self-pay | Admitting: Internal Medicine

## 2011-01-21 ENCOUNTER — Encounter: Payer: Self-pay | Admitting: *Deleted

## 2011-01-21 ENCOUNTER — Ambulatory Visit (INDEPENDENT_AMBULATORY_CARE_PROVIDER_SITE_OTHER): Payer: 59 | Admitting: Internal Medicine

## 2011-01-21 ENCOUNTER — Encounter: Payer: Self-pay | Admitting: Internal Medicine

## 2011-01-21 DIAGNOSIS — I442 Atrioventricular block, complete: Secondary | ICD-10-CM

## 2011-01-21 DIAGNOSIS — Z01810 Encounter for preprocedural cardiovascular examination: Secondary | ICD-10-CM

## 2011-01-21 DIAGNOSIS — I10 Essential (primary) hypertension: Secondary | ICD-10-CM

## 2011-01-21 DIAGNOSIS — I059 Rheumatic mitral valve disease, unspecified: Secondary | ICD-10-CM

## 2011-01-21 LAB — BASIC METABOLIC PANEL
BUN: 16 mg/dL (ref 6–23)
CO2: 26 mEq/L (ref 19–32)
Chloride: 101 mEq/L (ref 96–112)
Creat: 0.79 mg/dL (ref 0.50–1.10)
Glucose, Bld: 96 mg/dL (ref 70–99)
Potassium: 3.6 mEq/L (ref 3.5–5.3)

## 2011-01-21 LAB — PROTIME-INR: Prothrombin Time: 13.1 seconds (ref 11.6–15.2)

## 2011-01-21 LAB — CBC WITH DIFFERENTIAL/PLATELET
Basophils Relative: 1 % (ref 0–1)
Eosinophils Absolute: 0.1 10*3/uL (ref 0.0–0.7)
Eosinophils Relative: 2 % (ref 0–5)
Hemoglobin: 13 g/dL (ref 12.0–15.0)
Lymphs Abs: 1.7 10*3/uL (ref 0.7–4.0)
MCH: 32.6 pg (ref 26.0–34.0)
MCHC: 34.9 g/dL (ref 30.0–36.0)
MCV: 93.2 fL (ref 78.0–100.0)
Monocytes Relative: 11 % (ref 3–12)
Neutrophils Relative %: 57 % (ref 43–77)

## 2011-01-21 LAB — PACEMAKER DEVICE OBSERVATION
AL THRESHOLD: 1.25 V
BAMS-0003: 60 {beats}/min
DEVICE MODEL PM: 959366
VENTRICULAR PACING PM: 100

## 2011-01-21 LAB — APTT: aPTT: 33 seconds (ref 24–37)

## 2011-01-21 NOTE — Assessment & Plan Note (Signed)
Stable No change required today  

## 2011-01-21 NOTE — Progress Notes (Signed)
Carmen Green is a pleasant 60 y.o. WF patient with a h/o complete heart block and preserved ejection fraction s/p PPM (SJM) by Dr Juanda Chance 1992 with most recent generator placed 2004 who presents today for follow-up.  She has done well since initially having her pacemaker implanted.  She denies procedure related problems.  She remains very active and continues to do quite well.  Recent TTM revealed ERI battery status.  She therefore presents today for further evaluation and consideration of device replacement.= Today, she denies symptoms of palpitations, chest pain, shortness of breath, orthopnea, PND, lower extremity edema, dizziness, presyncope, syncope, or neurologic sequela. The patient is tolerating medications without difficulties and is otherwise without complaint today.   Past Medical History  Diagnosis Date  . Hypertension   . Hx of appendectomy   . Complete heart block   . Hyperlipidemia     s/p PPM implant by Dr Juanda Chance 1992 with generator change 2004 (SJM), she is device dependant  . Mitral valve prolapse   . Anxiety   . Hiatal hernia   . GERD (gastroesophageal reflux disease)   . Colon polyp   . Esophagitis   . Diverticulitis    Past Surgical History  Procedure Date  . Left ovary cyst removal 1972  . Pacemaker insertion 1992, 2004    device dependant  . Tubal ligation   . Colonoscopy 10/19/2004    [facility]  . Polypectomy     Current Outpatient Prescriptions  Medication Sig Dispense Refill  . buPROPion (WELLBUTRIN XL) 300 MG 24 hr tablet Take 300 mg by mouth daily.        . busPIRone (BUSPAR) 10 MG tablet Take 10 mg by mouth 3 (three) times daily.        . cetirizine (ZYRTEC) 10 MG tablet Take 10 mg by mouth. Daily as needed       . dicyclomine (BENTYL) 10 MG capsule TAKE ONE CAPSULE BY MOUTH TWICE DAILY AS NEEDED  60 capsule  1  . esomeprazole (NEXIUM) 40 MG capsule Take 40 mg by mouth 2 (two) times daily.        Marland Kitchen estrogen, conjugated,-medroxyprogesterone (PREMPRO)  0.45-1.5 MG per tablet Take 1 tablet by mouth daily.        Marland Kitchen guaiFENesin (MUCINEX) 600 MG 12 hr tablet Take 1,200 mg by mouth. 1 tab once daily as needed       . lisinopril-hydrochlorothiazide (PRINZIDE,ZESTORETIC) 20-12.5 MG per tablet Take 1 tablet by mouth daily.        Marland Kitchen LORazepam (ATIVAN) 0.5 MG tablet Take 0.5 mg by mouth at bedtime.        . Multiple Vitamin (MULTIVITAMIN) tablet Take 1 tablet by mouth daily.        . Omega-3 Fatty Acids (FISH OIL) 1000 MG CAPS Take by mouth daily.        Marland Kitchen pyridoxine (EQL B-6) 100 MG tablet Take 100 mg by mouth. 2 tabs once daily       . rosuvastatin (CRESTOR) 10 MG tablet Take 10 mg by mouth daily.         Current Facility-Administered Medications  Medication Dose Route Frequency Provider Last Rate Last Dose  . 0.9 %  sodium chloride infusion  500 mL Intravenous Continuous Hart Carwin, MD        Allergies  Allergen Reactions  . Cephalexin   . Penicillins     History   Social History  . Marital Status: Married    Spouse Name: N/A  Number of Children: N/A  . Years of Education: N/A   Occupational History  . Not on file.   Social History Main Topics  . Smoking status: Never Smoker   . Smokeless tobacco: Never Used  . Alcohol Use: No  . Drug Use: No  . Sexually Active: Not on file   Other Topics Concern  . 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 Diabetes Mother   . Heart disease Maternal Aunt   . Heart disease Maternal Grandmother   . Colon cancer Neg Hx     ROS- All systems are reviewed and negative except as per the HPI above  Physical Exam: Filed Vitals:   01/21/11 1614  BP: 124/78  Pulse: 88  Height: 5\' 1"  (1.549 m)  Weight: 154 lb (69.854 kg)    GEN- The patient is well appearing, alert and oriented x 3 today.   Head- normocephalic, atraumatic Eyes-  Sclera clear, conjunctiva  pink Ears- hearing intact Oropharynx- clear Neck- supple, no JVP Lymph- no cervical lymphadenopathy Lungs- Clear to ausculation bilaterally, normal work of breathing R sided pacemaker is in place Heart- Regular rate and rhythm, no murmurs, rubs or gallops, PMI not laterally displaced GI- soft, NT, ND, + BS Extremities- no clubbing, cyanosis, or edema MS- no significant deformity or atrophy Skin- no rash or lesion Psych- euthymic mood, full affect Neuro- strength and sensation are intact  Pacemaker interrogation reveals ERI battery status reached 10/10/10.  She is dependant.  Assessment and Plan:

## 2011-01-21 NOTE — Assessment & Plan Note (Signed)
She is s/p PPM for complete heart block and is device dependant. She has reacher ERI battery status and requires PPM generator change. Risks, benefits, and alternatives to PPM pulse generator replacement were discussed at length with the patient who wishes to proceed.  We will therefore plan PPM generator change at the next available time.

## 2011-01-22 ENCOUNTER — Ambulatory Visit (HOSPITAL_COMMUNITY)
Admission: RE | Admit: 2011-01-22 | Discharge: 2011-01-22 | Disposition: A | Discharge status: Home / Self Care | Payer: 59 | Source: Ambulatory Visit | Attending: Internal Medicine | Admitting: Internal Medicine

## 2011-01-22 DIAGNOSIS — I442 Atrioventricular block, complete: Secondary | ICD-10-CM

## 2011-01-22 DIAGNOSIS — I059 Rheumatic mitral valve disease, unspecified: Secondary | ICD-10-CM | POA: Insufficient documentation

## 2011-01-22 DIAGNOSIS — Z45018 Encounter for adjustment and management of other part of cardiac pacemaker: Secondary | ICD-10-CM | POA: Insufficient documentation

## 2011-01-22 DIAGNOSIS — I10 Essential (primary) hypertension: Secondary | ICD-10-CM | POA: Insufficient documentation

## 2011-01-22 LAB — SURGICAL PCR SCREEN: Staphylococcus aureus: NEGATIVE

## 2011-01-26 ENCOUNTER — Encounter: Payer: Self-pay | Admitting: *Deleted

## 2011-01-26 ENCOUNTER — Telehealth: Payer: Self-pay | Admitting: Internal Medicine

## 2011-01-26 NOTE — Telephone Encounter (Signed)
Called pt back  And let her know papers got back to [employer]  I will get her a note to return to work on 02/01/11 after her office visit

## 2011-01-26 NOTE — Telephone Encounter (Signed)
Per pt call, pt needs to know if FMLA papers got where they were suppose to go and if pt needs to bring payment. Pt also wanted to note to return to work on Monday, August 13th. Please return pt call to advise/ discuss.

## 2011-02-01 ENCOUNTER — Ambulatory Visit (INDEPENDENT_AMBULATORY_CARE_PROVIDER_SITE_OTHER): Payer: 59 | Admitting: *Deleted

## 2011-02-01 DIAGNOSIS — I442 Atrioventricular block, complete: Secondary | ICD-10-CM

## 2011-02-01 LAB — PACEMAKER DEVICE OBSERVATION
AL IMPEDENCE PM: 487.5 Ohm
ATRIAL PACING PM: 1
BAMS-0003: 70 {beats}/min
BATTERY VOLTAGE: 3.1283 V
VENTRICULAR PACING PM: 100

## 2011-02-01 NOTE — Progress Notes (Signed)
Wound check-PPM 

## 2011-02-02 NOTE — Telephone Encounter (Addendum)
FMLA papers received on [place], gave to [employer] for [employer] to Sign ( he will not be back into office until 02/03/11)   02/02/11/KM

## 2011-02-11 NOTE — Op Note (Signed)
NAME:  Carmen Green, Carmen Green NO.:  [id number]  MEDICAL RECORD NO.:  [id number]  LOCATION:  [facility]                         FACILITY:  [facility]  PHYSICIAN:  Hillis Range, MD       DATE OF BIRTH:  24-Nov-1950  DATE OF PROCEDURE: DATE OF DISCHARGE:  01/22/2011                              OPERATIVE REPORT   SURGEON:  Hillis Range, MD  PREPROCEDURE DIAGNOSIS:  Complete heart block.  POSTPROCEDURE DIAGNOSIS:  Complete heart block.  PROCEDURES:  Pacemaker pulse generator replacement with skin pocket revision.  INTRODUCTION:  [name] is a pleasant 60 year old female with a history of complete heart block who now presents for pacemaker generator change.  She initially had a pacemaker implanted in 1992.  Her generator was then replaced by Dr. Juanda Chance in 2004.  She reports doing very well. Most recently, device interrogation has confirmed that she is now again at elective replacement indicator.  She therefore presents today for pacemaker and pulse generator replacement.  DESCRIPTION OF PROCEDURE:  Informed written consent was obtained and the patient was brought to the electrophysiology lab in the fasting state. She was adequately sedated with intravenous Versed and fentanyl as outlined in the nursing report.  The patient's pacemaker was interrogated and confirmed to be at [facility] battery status.  She is device dependent today.  Her right chest was prepped and draped in the usual sterile fashion by the EP lab staff.  The skin overlying her existing pacemaker was infiltrated with lidocaine for local analgesia.  A 4-cm incision was made over the existing pacemaker.  Using a combination of sharp and blunt dissection, the pacemaker was exposed.  A single silk suture was identified and removed in its entirety.  There was no other foreign matter or debris within the pocket.  The pacemaker was removed from the pocket and disconnected from the leads.  The atrial lead was confirmed to  be a Occupational hygienist T - 52 (serial number [id number]) lead implanted September 07, 1990.  The right ventricular lead was confirmed to be a Agricultural engineer T - 60 (serial number [id number]) lead also implanted September 07, 1990.  Both leads were examined thoroughly and their integrity was confirmed to be intact.  Atrial lead P-waves measured 2.3 mV with impedance of 485 ohms and a threshold of 1.8 volts at 0.5 milliseconds.  Right ventricular lead R-waves were not present today.  The impedance was 540 ohms in the right ventricular lead and the threshold was 1.3 volts at 0.5 milliseconds.  Both leads were therefore connected to a St. Jude Medical Accent DR RF, model PM [id number] (serial number [id number]) pacemaker.  The pocket was irrigated with copious gentamicin solution.  The pocket was then revised to accommodate the new and larger device.  The pacemaker was placed into the pocket. The pocket was then closed in two layers with 2.0 Vicryl suture for the subcutaneous and subcuticular layers.  Steri-Strips and a sterile dressing were then applied.  There were no early apparent complications.  CONCLUSIONS: 1. Successful pacemaker pulse generator replacement with a St. Jude     Medical Accent DR RF pacemaker for complete heart block  and ERI     battery status. 2. No early apparent complications.     Hillis Range, MD     JA/MEDQ  D:  01/22/2011  T:  01/22/2011  Job:  [id number]  cc:   Marca Ancona, MD [name], MD  Electronically Signed by Hillis Range MD on 02/11/2011 09:43:47 AM

## 2011-02-16 ENCOUNTER — Telehealth: Payer: Self-pay | Admitting: Internal Medicine

## 2011-02-16 NOTE — Telephone Encounter (Signed)
Jewel will call pt about the remote device box. Mylo Red RN

## 2011-02-16 NOTE — Telephone Encounter (Signed)
Pt calling having some questions about her remote device box, and wanting to double check she is sending in remote checks properly. Please return pt call to advise/discuss.

## 2011-02-17 NOTE — Telephone Encounter (Signed)
Left paper copy of message for [employer].

## 2011-03-17 LAB — POCT I-STAT 3, VENOUS BLOOD GAS (G3P V)
Acid-base deficit: 2
Bicarbonate: 24.1 — ABNORMAL HIGH
Operator id: 233621
pCO2, Ven: 43 — ABNORMAL LOW
pH, Ven: 7.356 — ABNORMAL HIGH
pO2, Ven: 40

## 2011-03-17 LAB — POCT I-STAT 3, ART BLOOD GAS (G3+)
O2 Saturation: 96
TCO2: 26
pCO2 arterial: 40
pH, Arterial: 7.4
pO2, Arterial: 85

## 2011-04-26 ENCOUNTER — Encounter: Payer: 59 | Admitting: Internal Medicine

## 2011-05-05 ENCOUNTER — Encounter: Payer: Self-pay | Admitting: Internal Medicine

## 2011-05-05 ENCOUNTER — Ambulatory Visit (INDEPENDENT_AMBULATORY_CARE_PROVIDER_SITE_OTHER): Payer: 59 | Admitting: Internal Medicine

## 2011-05-05 VITALS — BP 138/72 | HR 76 | Ht 61.5 in | Wt 155.0 lb

## 2011-05-05 DIAGNOSIS — I442 Atrioventricular block, complete: Secondary | ICD-10-CM

## 2011-05-05 LAB — PACEMAKER DEVICE OBSERVATION
AL THRESHOLD: 1.5 V
BAMS-0001: 150 {beats}/min
BAMS-0003: 70 {beats}/min
DEVICE MODEL PM: 7252855
RV LEAD THRESHOLD: 1.375 V

## 2011-05-05 NOTE — Progress Notes (Signed)
The patient presents today for routine electrophysiology followup.  Since her recent generator change, the patient reports doing very well.  Today, she denies symptoms of palpitations, chest pain, shortness of breath, orthopnea, PND, lower extremity edema, dizziness, presyncope, syncope, or neurologic sequela.  The patient feels that she is tolerating medications without difficulties and is otherwise without complaint today.   Past Medical History  Diagnosis Date  . Hypertension   . Hx of appendectomy   . Complete heart block   . Hyperlipidemia     s/p PPM implant by Dr Juanda Chance 1992 with generator change 2004 (SJM), she is device dependant  . Mitral valve prolapse   . Anxiety   . Hiatal hernia   . GERD (gastroesophageal reflux disease)   . Colon polyp   . Esophagitis   . Diverticulitis    Past Surgical History  Procedure Date  . Left ovary cyst removal 1972  . Pacemaker insertion 1992, 2004, 2012    device dependant  . Tubal ligation   . Colonoscopy 10/19/2004    [facility]  . Polypectomy     Current Outpatient Prescriptions  Medication Sig Dispense Refill  . buPROPion (WELLBUTRIN XL) 300 MG 24 hr tablet Take 300 mg by mouth daily.        . busPIRone (BUSPAR) 10 MG tablet Take 10 mg by mouth 3 (three) times daily.        . cetirizine (ZYRTEC) 10 MG tablet Take 10 mg by mouth. Daily as needed       . esomeprazole (NEXIUM) 40 MG capsule Take 40 mg by mouth 2 (two) times daily.        Marland Kitchen estrogen, conjugated,-medroxyprogesterone (PREMPRO) 0.45-1.5 MG per tablet Take 1 tablet by mouth daily.        Marland Kitchen guaiFENesin (MUCINEX) 600 MG 12 hr tablet Take 1,200 mg by mouth. 1 tab once daily as needed       . lisinopril-hydrochlorothiazide (PRINZIDE,ZESTORETIC) 20-12.5 MG per tablet Take 1 tablet by mouth daily.        Marland Kitchen LORazepam (ATIVAN) 0.5 MG tablet Take 0.5 mg by mouth at bedtime.        . Multiple Vitamin (MULTIVITAMIN) tablet Take 1 tablet by mouth daily.        . Omega-3 Fatty Acids (FISH OIL)  1000 MG CAPS Take by mouth daily.        Marland Kitchen pyridoxine (EQL B-6) 100 MG tablet Take 100 mg by mouth. 2 tabs once daily       . rosuvastatin (CRESTOR) 10 MG tablet Take 10 mg by mouth daily.          Allergies  Allergen Reactions  . Cephalexin   . Penicillins     History   Social History  . Marital Status: Married    Spouse Name: N/A    Number of Children: N/A  . Years of Education: N/A   Occupational History  . Not on file.   Social History Main Topics  . Smoking status: Never Smoker   . Smokeless tobacco: Never Used  . Alcohol Use: No  . Drug Use: No  . Sexually Active: Not on file   Other Topics Concern  . 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 Diabetes Mother   . Heart disease Maternal Aunt   . Heart disease Maternal Grandmother   .  Colon cancer Neg Hx    Physical Exam: Filed Vitals:   05/05/11 1610  BP: 138/72  Pulse: 76  Height: 5' 1.5" (1.562 m)  Weight: 155 lb (70.308 kg)    GEN- The patient is well appearing, alert and oriented x 3 today.   Head- normocephalic, atraumatic Eyes-  Sclera clear, conjunctiva pink Ears- hearing intact Oropharynx- clear Neck- supple, no JVP Lungs- Clear to ausculation bilaterally, normal work of breathing Chest- pacemaker pocket is well healed Heart- Regular rate and rhythm, no murmurs, rubs or gallops, PMI not laterally displaced GI- soft, NT, ND, + BS Extremities- no clubbing, cyanosis, or edema  Pacemaker interrogation- reviewed in detail today,  See PACEART report  Assessment and Plan:

## 2011-05-05 NOTE — Assessment & Plan Note (Signed)
Normal pacemaker function See Arita Miss Art report No changes today  Return 8/13 to see me

## 2011-05-05 NOTE — Patient Instructions (Signed)
Your physician wants you to follow-up in: 01/2012 You will receive a reminder letter in the mail two months in advance. If you don't receive a letter, please call our office to schedule the follow-up appointment.

## 2011-05-12 ENCOUNTER — Other Ambulatory Visit: Payer: Self-pay | Admitting: Internal Medicine

## 2011-06-17 ENCOUNTER — Other Ambulatory Visit: Payer: Self-pay | Admitting: *Deleted

## 2011-06-17 DIAGNOSIS — Z1231 Encounter for screening mammogram for malignant neoplasm of breast: Secondary | ICD-10-CM

## 2011-07-09 ENCOUNTER — Ambulatory Visit: Payer: 59

## 2011-07-23 ENCOUNTER — Encounter: Payer: Self-pay | Admitting: Cardiology

## 2011-07-23 ENCOUNTER — Ambulatory Visit (INDEPENDENT_AMBULATORY_CARE_PROVIDER_SITE_OTHER): Payer: 59 | Admitting: Cardiology

## 2011-07-23 ENCOUNTER — Ambulatory Visit
Admission: RE | Admit: 2011-07-23 | Discharge: 2011-07-23 | Disposition: A | Discharge status: Home / Self Care | Payer: 59 | Source: Ambulatory Visit | Attending: *Deleted | Admitting: *Deleted

## 2011-07-23 DIAGNOSIS — I442 Atrioventricular block, complete: Secondary | ICD-10-CM

## 2011-07-23 DIAGNOSIS — Z1231 Encounter for screening mammogram for malignant neoplasm of breast: Secondary | ICD-10-CM

## 2011-07-23 DIAGNOSIS — I059 Rheumatic mitral valve disease, unspecified: Secondary | ICD-10-CM

## 2011-07-23 NOTE — Assessment & Plan Note (Signed)
Stable, s/p PCM generator change last summer.    Given family history of CVA, I think it would be reasonable for her to take ASA 81 mg daily.

## 2011-07-23 NOTE — Patient Instructions (Signed)
Start aspirin 81mg  daily.  Your physician wants you to follow-up in: 1 year with Dr Shirlee Latch. (February 2014). You will receive a reminder letter in the mail two months in advance. If you don't receive a letter, please call our office to schedule the follow-up appointment.

## 2011-07-23 NOTE — Assessment & Plan Note (Signed)
Mild to moderate MR on last echo.  Exam consistent with MVP with MR.  Will continue to monitor.

## 2011-07-23 NOTE — Progress Notes (Signed)
PCP: Dr. Charm Barges  61 yo with history of pacemaker for AV block and MV prolapse presents for cardiology followup.  No chest pain. She has chronic dyspnea when climbing a flight of steps. She is able to walk on flat ground without problems. No orthopnea or PND. She has a stressful job in a Industrial/product designer.  BP has been under good control.  She had a pacemaker generator change in 8/12.    Allergies:  1) ! Keflex  2) ! Pcn   Past Medical History:  1. 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  2. MITRAL VALVE PROLAPSE (ICD-424.0): Echo (12/11) with EF 55-60%, grade I diastolic dysfunction, mild MR with mitral valve prolapse, mild left atrial enlargement, asynchronous septum due to pacing.  3. HYPERLIPIDEMIA (ICD-272.4)  4. HYPERTENSION (ICD-401.9)  5. ANXIETY (ICD-300.00)  6. HIATAL HERNIA (ICD-553.3)  7. GERD (ICD-530.81)  8. COLONIC POLYPS, HX OF (ICD-V12.72)  9. Hx of ESOPHAGITIS (ICD-530.10)  10. LHC (5/09): no coronary disease   Family History:  Family History of Diabetes: Mother, Grandmother  Family History of Heart Disease: Maternal Grandmother, Maternal Aunt  Mother with CVA  Social History:  Illicit Drug Use - no  Patient has never smoked.  Alcohol Use - no  Patient does not get regular exercise.  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 buPROPion (WELLBUTRIN XL) 300 MG 24 hr tablet Take 300 mg by mouth daily.        . busPIRone (BUSPAR) 10 MG tablet Take 10 mg by mouth 3 (three) times daily.        . cetirizine (ZYRTEC) 10 MG tablet Take 10 mg by mouth. Daily as needed       . estrogen, conjugated,-medroxyprogesterone (PREMPRO) 0.45-1.5 MG per tablet Take 1 tablet by mouth daily.        Marland Kitchen guaiFENesin (MUCINEX) 600 MG 12 hr tablet Take 1,200 mg by mouth. 1 tab once daily as needed       . lisinopril-hydrochlorothiazide (PRINZIDE,ZESTORETIC) 20-12.5 MG per tablet Take 1  tablet by mouth daily.        Marland Kitchen LORazepam (ATIVAN) 0.5 MG tablet Take 0.5 mg by mouth at bedtime.        . Multiple Vitamin (MULTIVITAMIN) tablet Take 1 tablet by mouth daily.        Marland Kitchen NEXIUM 40 MG capsule TAKE 1 CAPSULE BY MOUTH TWICE DAILY  180 capsule  1  . Omega-3 Fatty Acids (FISH OIL) 1000 MG CAPS Take by mouth daily.        . rosuvastatin (CRESTOR) 10 MG tablet Take 10 mg by mouth daily.        Marland Kitchen aspirin EC 81 MG tablet Take 1 tablet (81 mg total) by mouth daily.        BP 140/80  Pulse 82  Ht 5\' 5"  (1.651 m)  Wt 70.308 kg (155 lb)  BMI 25.79 kg/m2 General: NAD Neck: No JVD, no thyromegaly or thyroid nodule.  Lungs: Clear to auscultation bilaterally with normal respiratory effort. CV: Nondisplaced PMI.  Heart regular S1/S2, no S3/S4, mid systolic click with 2/6 late systolic murmur.  No peripheral edema.  No carotid bruit.  Normal pedal pulses.  Abdomen: Soft, nontender, no hepatosplenomegaly, no distention.  Neurologic: Alert and oriented x 3.  Psych: Normal affect. Extremities: No clubbing or cyanosis.

## 2011-08-03 NOTE — Progress Notes (Signed)
Addended by: Judithe Modest D on: 08/03/2011 10:04 AM   Modules accepted: Orders

## 2011-09-06 ENCOUNTER — Encounter: Payer: Self-pay | Admitting: Internal Medicine

## 2011-09-22 ENCOUNTER — Encounter: Payer: Self-pay | Admitting: Internal Medicine

## 2011-09-23 ENCOUNTER — Encounter: Payer: Self-pay | Admitting: Cardiology

## 2011-11-12 ENCOUNTER — Other Ambulatory Visit: Payer: Self-pay | Admitting: Internal Medicine

## 2012-02-18 ENCOUNTER — Encounter: Payer: Self-pay | Admitting: Internal Medicine

## 2012-02-18 ENCOUNTER — Ambulatory Visit (INDEPENDENT_AMBULATORY_CARE_PROVIDER_SITE_OTHER): Payer: 59 | Admitting: Internal Medicine

## 2012-02-18 VITALS — BP 122/78 | HR 80 | Resp 18 | Ht 71.0 in | Wt 152.0 lb

## 2012-02-18 DIAGNOSIS — I10 Essential (primary) hypertension: Secondary | ICD-10-CM

## 2012-02-18 DIAGNOSIS — I442 Atrioventricular block, complete: Secondary | ICD-10-CM

## 2012-02-18 LAB — PACEMAKER DEVICE OBSERVATION
AL AMPLITUDE: 2.1 mv
AL THRESHOLD: 1.5 V
BAMS-0001: 150 {beats}/min
BATTERY VOLTAGE: 2.9629 V
RV LEAD AMPLITUDE: 7.5 mv

## 2012-02-18 NOTE — Progress Notes (Signed)
PCP: [name], DO Primary Cardiologist:  Dr Terisa Starr Carmen Green is a 61 y.o. female who presents today for routine electrophysiology followup.  Since last being seen in our clinic, the patient reports doing very well.  Today, she denies symptoms of palpitations, chest pain, shortness of breath,  lower extremity edema, dizziness, presyncope, or syncope.  The patient is otherwise without complaint today.   Past Medical History  Diagnosis Date  . Hypertension   . Hx of appendectomy   . Complete heart block   . Hyperlipidemia     s/p PPM implant by Dr Juanda Chance 1992 with generator change 2004 (SJM), she is device dependant  . Mitral valve prolapse   . Anxiety   . Hiatal hernia   . GERD (gastroesophageal reflux disease)   . Colon polyp   . Esophagitis   . Diverticulitis    Past Surgical History  Procedure Date  . Left ovary cyst removal 1972  . Pacemaker insertion 1992, 2004, 2012    device dependant  . Tubal ligation   . Colonoscopy 10/19/2004    [facility]  . Polypectomy     Current Outpatient Prescriptions  Medication Sig Dispense Refill  . aspirin EC 81 MG tablet Take 1 tablet (81 mg total) by mouth daily.      Marland Kitchen buPROPion (WELLBUTRIN XL) 300 MG 24 hr tablet Take 300 mg by mouth daily.        . busPIRone (BUSPAR) 10 MG tablet Take 10 mg by mouth 3 (three) times daily.        . cetirizine (ZYRTEC) 10 MG tablet Take 10 mg by mouth. Daily as needed       . estrogen, conjugated,-medroxyprogesterone (PREMPRO) 0.45-1.5 MG per tablet Take 1 tablet by mouth daily.        Marland Kitchen guaiFENesin (MUCINEX) 600 MG 12 hr tablet Take 1,200 mg by mouth. 1 tab once daily as needed       . lisinopril-hydrochlorothiazide (PRINZIDE,ZESTORETIC) 20-12.5 MG per tablet Take 1 tablet by mouth daily.        Marland Kitchen LORazepam (ATIVAN) 0.5 MG tablet Take 0.5 mg by mouth at bedtime.        . Multiple Vitamin (MULTIVITAMIN) tablet Take 1 tablet by mouth daily.        Marland Kitchen NEXIUM 40 MG capsule TAKE 1 CAPSULE BY MOUTH TWICE  DAILY  180 capsule  1  . Omega-3 Fatty Acids (FISH OIL) 1000 MG CAPS Take by mouth daily.        . rosuvastatin (CRESTOR) 10 MG tablet Take 10 mg by mouth daily.          Physical Exam: Filed Vitals:   02/18/12 1528  BP: 122/78  Pulse: 80  Resp: 18  Height: 5\' 11"  (1.803 m)  Weight: 152 lb (68.947 kg)  SpO2: 97%    GEN- The patient is well appearing, alert and oriented x 3 today.   Head- normocephalic, atraumatic Eyes-  Sclera clear, conjunctiva pink Ears- hearing intact Oropharynx- clear Lungs- Clear to ausculation bilaterally, normal work of breathing Chest- pacemaker pocket is well healed Heart- Regular rate and rhythm, no murmurs, rubs or gallops, PMI not laterally displaced GI- soft, NT, ND, + BS Extremities- no clubbing, cyanosis, or edema  Pacemaker interrogation- reviewed in detail today,  See PACEART report  Assessment and Plan:

## 2012-02-18 NOTE — Patient Instructions (Signed)
Your physician wants you to follow-up in: 12 months with Dr Jacquiline Doe will receive a reminder letter in the mail two months in advance. If you don't receive a letter, please call our office to schedule the follow-up appointment.   Remote monitoring is used to monitor your Pacemaker of ICD from home. This monitoring reduces the number of office visits required to check your device to one time per year. It allows [place] to keep an eye on the functioning of your device to ensure it is working properly. You are scheduled for a device check from home on 05/22/12. You may send your transmission at any time that day. If you have a wireless device, the transmission will be sent automatically. After your physician reviews your transmission, you will receive a postcard with your next transmission date.

## 2012-02-18 NOTE — Assessment & Plan Note (Signed)
Stable No change required today  

## 2012-02-18 NOTE — Assessment & Plan Note (Signed)
Normal pacemaker function See Pace Art report No changes today  

## 2012-05-22 ENCOUNTER — Encounter: Payer: 59 | Admitting: *Deleted

## 2012-05-30 ENCOUNTER — Other Ambulatory Visit: Payer: Self-pay | Admitting: *Deleted

## 2012-05-30 ENCOUNTER — Encounter: Payer: Self-pay | Admitting: *Deleted

## 2012-05-30 ENCOUNTER — Other Ambulatory Visit: Payer: Self-pay | Admitting: Obstetrics and Gynecology

## 2012-05-30 DIAGNOSIS — Z1231 Encounter for screening mammogram for malignant neoplasm of breast: Secondary | ICD-10-CM

## 2012-06-05 ENCOUNTER — Encounter: Payer: Self-pay | Admitting: Internal Medicine

## 2012-06-05 ENCOUNTER — Ambulatory Visit (INDEPENDENT_AMBULATORY_CARE_PROVIDER_SITE_OTHER): Payer: 59 | Admitting: *Deleted

## 2012-06-05 ENCOUNTER — Telehealth: Payer: Self-pay | Admitting: Internal Medicine

## 2012-06-05 DIAGNOSIS — Z950 Presence of cardiac pacemaker: Secondary | ICD-10-CM

## 2012-06-05 DIAGNOSIS — I442 Atrioventricular block, complete: Secondary | ICD-10-CM

## 2012-06-05 NOTE — Telephone Encounter (Signed)
Patient will send transmission later today.

## 2012-06-05 NOTE — Telephone Encounter (Signed)
plz return call to pt at wk # regarding remote transmission until 5pm. Can be reached on cell after 5pm.  She is having problems transmitting

## 2012-06-06 LAB — REMOTE PACEMAKER DEVICE
ATRIAL PACING PM: 1
BATTERY VOLTAGE: 2.96 V
BRDY-0002RV: 60 {beats}/min
BRDY-0004RV: 130 {beats}/min
RV LEAD IMPEDENCE PM: 510 Ohm
VENTRICULAR PACING PM: 100

## 2012-06-16 ENCOUNTER — Encounter: Payer: Self-pay | Admitting: *Deleted

## 2012-06-22 ENCOUNTER — Telehealth: Payer: Self-pay | Admitting: Internal Medicine

## 2012-06-22 NOTE — Telephone Encounter (Signed)
New message:  Pt states she had strange feelings yesterday and wanted to know if any transmissions had been sent in?

## 2012-06-23 NOTE — Telephone Encounter (Signed)
N/A [phone] [id number]

## 2012-06-26 NOTE — Telephone Encounter (Signed)
Spoke w/pt in regards to weird feelings. Pt thinks could come from new medicine PCP prescribed. Pt stopped taking on Wednesday 06-21-12 and has no weird feelings since then.

## 2012-07-28 ENCOUNTER — Ambulatory Visit
Admission: RE | Admit: 2012-07-28 | Discharge: 2012-07-28 | Disposition: A | Discharge status: Home / Self Care | Payer: 59 | Source: Ambulatory Visit | Attending: Obstetrics and Gynecology | Admitting: Obstetrics and Gynecology

## 2012-07-28 DIAGNOSIS — Z1231 Encounter for screening mammogram for malignant neoplasm of breast: Secondary | ICD-10-CM

## 2012-08-04 ENCOUNTER — Ambulatory Visit: Payer: 59 | Admitting: Cardiology

## 2012-09-11 ENCOUNTER — Other Ambulatory Visit: Payer: Self-pay | Admitting: Internal Medicine

## 2012-09-11 ENCOUNTER — Encounter: Payer: Self-pay | Admitting: Internal Medicine

## 2012-09-11 ENCOUNTER — Ambulatory Visit (INDEPENDENT_AMBULATORY_CARE_PROVIDER_SITE_OTHER): Payer: 59 | Admitting: *Deleted

## 2012-09-11 DIAGNOSIS — I442 Atrioventricular block, complete: Secondary | ICD-10-CM

## 2012-09-11 DIAGNOSIS — Z950 Presence of cardiac pacemaker: Secondary | ICD-10-CM

## 2012-09-13 LAB — REMOTE PACEMAKER DEVICE
ATRIAL PACING PM: 1
BRDY-0002RV: 60 {beats}/min
BRDY-0003RV: 130 {beats}/min
BRDY-0004RV: 130 {beats}/min
DEVICE MODEL PM: 7252855
RV LEAD THRESHOLD: 1.375 V

## 2012-09-22 ENCOUNTER — Encounter: Payer: Self-pay | Admitting: Cardiology

## 2012-09-22 ENCOUNTER — Ambulatory Visit (INDEPENDENT_AMBULATORY_CARE_PROVIDER_SITE_OTHER): Payer: 59 | Admitting: Cardiology

## 2012-09-22 VITALS — BP 150/78 | HR 79 | Ht 71.0 in | Wt 157.0 lb

## 2012-09-22 DIAGNOSIS — I059 Rheumatic mitral valve disease, unspecified: Secondary | ICD-10-CM

## 2012-09-22 DIAGNOSIS — I10 Essential (primary) hypertension: Secondary | ICD-10-CM

## 2012-09-22 DIAGNOSIS — I442 Atrioventricular block, complete: Secondary | ICD-10-CM

## 2012-09-22 NOTE — Patient Instructions (Addendum)
Your physician has requested that you have an echocardiogram. Echocardiography is a painless test that uses sound waves to create images of your heart. It provides your doctor with information about the size and shape of your heart and how well your heart's chambers and valves are working. This procedure takes approximately one hour. There are no restrictions for this procedure.  Take and record your blood pressure. I will call you in  2 weeks to get the readings. Luana Shu [phone]  Your physician wants you to follow-up in: 1 year with Dr Shirlee Latch. (April 2015). You will receive a reminder letter in the mail two months in advance. If you don't receive a letter, please call our office to schedule the follow-up appointment.

## 2012-09-24 NOTE — Progress Notes (Signed)
Patient ID: Carmen Green, female   DOB: 12/18/1950, 62 y.o.   MRN: [id number] PCP: Dr. Charm Barges  62 yo with history of pacemaker for AV block and MV prolapse presents for cardiology followup.  No chest pain. She has chronic dyspnea when climbing a flight of steps. She is able to walk on flat ground without problems. No orthopnea or PND. She has a stressful job in a Industrial/product designer.  BP is high today.  She has not been checking it at home.   ECG: NSR, v-paced  Allergies:  1) ! Keflex  2) ! Pcn   Past Medical History:  1. 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  2. MITRAL VALVE PROLAPSE (ICD-424.0): Echo (12/11) with EF 55-60%, grade I diastolic dysfunction, mild MR with mitral valve prolapse, mild left atrial enlargement, asynchronous septum due to pacing.  3. HYPERLIPIDEMIA (ICD-272.4)  4. HYPERTENSION (ICD-401.9)  5. ANXIETY (ICD-300.00)  6. HIATAL HERNIA (ICD-553.3)  7. GERD (ICD-530.81)  8. COLONIC POLYPS, HX OF (ICD-V12.72)  9. Hx of ESOPHAGITIS (ICD-530.10)  10. LHC (5/09): no coronary disease   Family History:  Family History of Diabetes: Mother, Grandmother  Family History of Heart Disease: Maternal Grandmother, Maternal Aunt  Mother with CVA  Social History:  Illicit Drug Use - no  Patient has never smoked.  Alcohol Use - no  Patient does not get regular exercise.  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   Current Outpatient Prescriptions  Medication Sig Dispense Refill  . aspirin EC 81 MG tablet Take 1 tablet (81 mg total) by mouth daily.      Marland Kitchen buPROPion (WELLBUTRIN XL) 300 MG 24 hr tablet Take 300 mg by mouth daily.        . busPIRone (BUSPAR) 10 MG tablet Take 10 mg by mouth 3 (three) times daily.        . cetirizine (ZYRTEC) 10 MG tablet Take 10 mg by mouth. Daily as needed       . estrogen, conjugated,-medroxyprogesterone (PREMPRO) 0.45-1.5 MG per tablet Take 1  tablet by mouth daily.        Marland Kitchen guaiFENesin (MUCINEX) 600 MG 12 hr tablet Take 1,200 mg by mouth. 1 tab once daily as needed       . lisinopril-hydrochlorothiazide (PRINZIDE,ZESTORETIC) 20-12.5 MG per tablet Take 1 tablet by mouth daily.        Marland Kitchen LORazepam (ATIVAN) 0.5 MG tablet Take 0.5 mg by mouth at bedtime.        . Multiple Vitamin (MULTIVITAMIN) tablet Take 1 tablet by mouth daily.        Marland Kitchen NEXIUM 40 MG capsule TAKE 1 CAPSULE BY MOUTH TWICE DAILY  180 capsule  1  . rosuvastatin (CRESTOR) 10 MG tablet Take 10 mg by mouth daily.         No current facility-administered medications for this visit.    BP 150/78  Pulse 79  Ht 5\' 11"  (1.803 m)  Wt 157 lb (71.215 kg)  BMI 21.91 kg/m2 General: NAD Neck: No JVD, no thyromegaly or thyroid nodule.  Lungs: Clear to auscultation bilaterally with normal respiratory effort. CV: Nondisplaced PMI.  Heart regular S1/S2, no S3/S4, mid systolic click with 2/6 HSM at apex.  No peripheral edema.  No carotid bruit.  Normal pedal pulses.  Abdomen: Soft, nontender, no hepatosplenomegaly, no distention.  Neurologic: Alert and oriented x 3.  Psych: Normal  affect. Extremities: No clubbing or cyanosis.   Assessment/Plan: 1. St Jude PCM: Gets regular PCM followup.  2. Mitral regurgitation: Stable dyspnea.  Murmur seems louder than in the past.  I will get an echocardiogram. 3. HTN: BP running high today.  I will have her check her BP daily x 2 wks and we will call her at that time to see how her numbers are running.   Marca Ancona 09/24/2012

## 2012-09-29 ENCOUNTER — Ambulatory Visit (HOSPITAL_COMMUNITY): Payer: 59 | Attending: Cardiology

## 2012-09-29 DIAGNOSIS — I059 Rheumatic mitral valve disease, unspecified: Secondary | ICD-10-CM | POA: Insufficient documentation

## 2012-09-29 NOTE — Progress Notes (Signed)
Echocardiogram performed.  

## 2012-10-03 ENCOUNTER — Telehealth: Payer: Self-pay | Admitting: Cardiology

## 2012-10-03 ENCOUNTER — Encounter: Payer: Self-pay | Admitting: *Deleted

## 2012-10-03 NOTE — Telephone Encounter (Signed)
Called patient to report ECHO results.  Patient verbalized understanding and reported blood pressures over past week due to BP was high during last o/v on 4/4: 4/7 122/64 4/8 136/72 4/9 122/70 4/10 110/70 4/15 130/62  Will route to DM for review of BPs.

## 2012-10-03 NOTE — Telephone Encounter (Signed)
BP ok 

## 2012-10-04 NOTE — Telephone Encounter (Signed)
Pt notified of comments from Dr. Shirlee Latch

## 2012-11-21 ENCOUNTER — Other Ambulatory Visit: Payer: Self-pay | Admitting: *Deleted

## 2012-11-21 MED ORDER — ESOMEPRAZOLE MAGNESIUM 40 MG PO CPDR: DELAYED_RELEASE_CAPSULE | ORAL | Status: DC

## 2012-12-18 ENCOUNTER — Ambulatory Visit (INDEPENDENT_AMBULATORY_CARE_PROVIDER_SITE_OTHER): Payer: 59 | Admitting: *Deleted

## 2012-12-18 DIAGNOSIS — I442 Atrioventricular block, complete: Secondary | ICD-10-CM

## 2012-12-18 DIAGNOSIS — Z950 Presence of cardiac pacemaker: Secondary | ICD-10-CM

## 2012-12-20 ENCOUNTER — Encounter: Payer: Self-pay | Admitting: Internal Medicine

## 2012-12-20 LAB — REMOTE PACEMAKER DEVICE
AL AMPLITUDE: 4.1 mv
AL IMPEDENCE PM: 440 Ohm
ATRIAL PACING PM: 1
BAMS-0001: 150 {beats}/min
BRDY-0004RV: 130 {beats}/min
DEVICE MODEL PM: 7252855
RV LEAD IMPEDENCE PM: 540 Ohm
RV LEAD THRESHOLD: 1.625 V

## 2012-12-27 ENCOUNTER — Encounter: Payer: Self-pay | Admitting: *Deleted

## 2013-01-31 ENCOUNTER — Encounter: Payer: Self-pay | Admitting: Internal Medicine

## 2013-03-02 ENCOUNTER — Encounter: Payer: 59 | Admitting: Internal Medicine

## 2013-03-21 ENCOUNTER — Encounter: Payer: Self-pay | Admitting: Internal Medicine

## 2013-03-21 ENCOUNTER — Ambulatory Visit (INDEPENDENT_AMBULATORY_CARE_PROVIDER_SITE_OTHER): Payer: 59 | Admitting: Internal Medicine

## 2013-03-21 VITALS — BP 110/78 | HR 68 | Ht 61.5 in | Wt 151.0 lb

## 2013-03-21 DIAGNOSIS — I10 Essential (primary) hypertension: Secondary | ICD-10-CM

## 2013-03-21 DIAGNOSIS — I442 Atrioventricular block, complete: Secondary | ICD-10-CM

## 2013-03-21 LAB — PACEMAKER DEVICE OBSERVATION
AL AMPLITUDE: 3.8 mv
AL IMPEDENCE PM: 440 Ohm
ATRIAL PACING PM: 1
RV LEAD IMPEDENCE PM: 510 Ohm
VENTRICULAR PACING PM: 99

## 2013-03-21 NOTE — Patient Instructions (Addendum)
Remote monitoring is used to monitor your Pacemaker of ICD from home. This monitoring reduces the number of office visits required to check your device to one time per year. It allows [place] to keep an eye on the functioning of your device to ensure it is working properly. You are scheduled for a device check from home on June 22, 2013. You may send your transmission at any time that day. If you have a wireless device, the transmission will be sent automatically. After your physician reviews your transmission, you will receive a postcard with your next transmission date.  Your physician wants you to follow-up in: 1 year with Dr Johney Frame. You will receive a reminder letter in the mail two months in advance. If you don't receive a letter, please call our office to schedule the follow-up appointment.

## 2013-03-21 NOTE — Progress Notes (Signed)
PCP: Carmen Jester, DO Primary Cardiologist: Carmen Green is a 62 y.o. female who presents today for routine electrophysiology followup.  Since last being seen in our clinic, the patient reports doing very well.  She has had a recent URI for which she was treated with Prednisone.   Today, she denies symptoms of palpitations, chest pain, shortness of breath,  lower extremity edema, dizziness, presyncope, or syncope.  The patient is otherwise without complaint today.   Past Medical History  Diagnosis Date  . Hypertension   . Hx of appendectomy   . Complete heart block   . Hyperlipidemia     s/p PPM implant by Dr Juanda Chance 1992 with generator change 2004 ([facility]), she is device dependant  . Mitral valve prolapse   . Anxiety   . Hiatal hernia   . GERD (gastroesophageal reflux disease)   . Colon polyp   . Esophagitis   . Diverticulitis    Past Surgical History  Procedure Laterality Date  . Left ovary cyst removal  1972  . Pacemaker insertion  1992, 2004, 2012    device dependant  . Tubal ligation    . Colonoscopy  10/19/2004    [facility]  . Polypectomy      Current Outpatient Prescriptions  Medication Sig Dispense Refill  . aspirin EC 81 MG tablet Take 1 tablet (81 mg total) by mouth daily.      Marland Kitchen buPROPion (WELLBUTRIN XL) 300 MG 24 hr tablet Take 300 mg by mouth daily.        . busPIRone (BUSPAR) 10 MG tablet Take 10 mg by mouth 3 (three) times daily.        . cetirizine (ZYRTEC) 10 MG tablet Take 10 mg by mouth. Daily as needed       . esomeprazole (NEXIUM) 40 MG capsule Take 1 cap by mouth twice daily.  60 capsule  0  . estrogen, conjugated,-medroxyprogesterone (PREMPRO) 0.45-1.5 MG per tablet Take 1 tablet by mouth daily.        Marland Kitchen guaiFENesin (MUCINEX) 600 MG 12 hr tablet Take 1,200 mg by mouth. 1 tab once daily as needed       . lisinopril-hydrochlorothiazide (PRINZIDE,ZESTORETIC) 20-12.5 MG per tablet Take 1 tablet by mouth daily.        Marland Kitchen LORazepam (ATIVAN) 0.5 MG tablet  Take 0.5 mg by mouth at bedtime.        . Multiple Vitamin (MULTIVITAMIN) tablet Take 1 tablet by mouth daily.        . rosuvastatin (CRESTOR) 10 MG tablet Take 10 mg by mouth daily.         No current facility-administered medications for this visit.    Physical Exam: Filed Vitals:   03/21/13 1617  BP: 110/78  Pulse: 68  Height: 5' 1.5" (1.562 m)  Weight: 151 lb (68.493 kg)    GEN- The patient is well appearing, alert and oriented x 3 today.   Head- normocephalic, atraumatic Eyes-  Sclera clear, conjunctiva pink Ears- hearing intact Oropharynx- clear Lungs- Clear to ausculation bilaterally, normal work of breathing Chest- pacemaker pocket is well healed Heart- Regular rate and rhythm,  GI- soft, NT, ND, + BS Extremities- no clubbing, cyanosis, or edema  Pacemaker interrogation- reviewed in detail today,  See PACEART report  Assessment and Plan:  1. Complete heart block Normal pacemaker function See Pace Art report No changes today  2. HTN Stable No change required today  Merlin Return in 1year

## 2013-03-28 ENCOUNTER — Encounter: Payer: Self-pay | Admitting: Internal Medicine

## 2013-05-21 ENCOUNTER — Other Ambulatory Visit: Payer: Self-pay | Admitting: Internal Medicine

## 2013-05-28 ENCOUNTER — Telehealth: Payer: Self-pay | Admitting: Internal Medicine

## 2013-05-28 NOTE — Telephone Encounter (Addendum)
Pt wants to know specific allowable hearing aids that are compatible w/ her [facility] ppm. I let her know the issue is the presence of a magnet around a pacemaker. Gave pt the 800 # to contact [facility] directly to specify if there are hearing aid restrictions.

## 2013-05-28 NOTE — Telephone Encounter (Signed)
New problem   S/P pacer maker.    Patient calling Need two hearing aid. Please advise since she has a pacer maker is there anything she need to do different.

## 2013-06-22 ENCOUNTER — Ambulatory Visit (INDEPENDENT_AMBULATORY_CARE_PROVIDER_SITE_OTHER): Payer: 59 | Admitting: *Deleted

## 2013-06-22 DIAGNOSIS — I442 Atrioventricular block, complete: Secondary | ICD-10-CM

## 2013-06-22 DIAGNOSIS — I428 Other cardiomyopathies: Secondary | ICD-10-CM

## 2013-06-24 LAB — MDC_IDC_ENUM_SESS_TYPE_REMOTE
Battery Remaining Longevity: 66 mo
Brady Statistic AP VS Percent: 1 %
Brady Statistic AS VS Percent: 1 %
Brady Statistic RA Percent Paced: 1 %
Brady Statistic RV Percent Paced: 99 %
Date Time Interrogation Session: 20150102083639
Lead Channel Impedance Value: 540 Ohm
Lead Channel Pacing Threshold Amplitude: 1.5 V
Lead Channel Pacing Threshold Amplitude: 1.5 V
Lead Channel Pacing Threshold Pulse Width: 0.5 ms
Lead Channel Sensing Intrinsic Amplitude: 2.1 mV
Lead Channel Setting Pacing Amplitude: 1.75 V
Lead Channel Setting Pacing Pulse Width: 0.5 ms
Lead Channel Setting Sensing Sensitivity: 4 mV
MDC IDC MSMT BATTERY VOLTAGE: 2.96 V
MDC IDC MSMT LEADCHNL RA IMPEDANCE VALUE: 460 Ohm
MDC IDC MSMT LEADCHNL RA PACING THRESHOLD PULSEWIDTH: 0.5 ms
MDC IDC MSMT LEADCHNL RV SENSING INTR AMPL: 11.5 mV
MDC IDC PG SERIAL: 7252855
MDC IDC SET LEADCHNL RA PACING AMPLITUDE: 3 V
MDC IDC STAT BRADY AP VP PERCENT: 1 %
MDC IDC STAT BRADY AS VP PERCENT: 99 %

## 2013-06-28 ENCOUNTER — Encounter: Payer: Self-pay | Admitting: Gastroenterology

## 2013-06-29 ENCOUNTER — Ambulatory Visit (INDEPENDENT_AMBULATORY_CARE_PROVIDER_SITE_OTHER): Payer: 59 | Admitting: Physician Assistant

## 2013-06-29 ENCOUNTER — Encounter: Payer: Self-pay | Admitting: Physician Assistant

## 2013-06-29 VITALS — BP 130/70 | HR 66 | Ht 60.5 in | Wt 151.0 lb

## 2013-06-29 DIAGNOSIS — K219 Gastro-esophageal reflux disease without esophagitis: Secondary | ICD-10-CM

## 2013-06-29 MED ORDER — ESOMEPRAZOLE MAGNESIUM 40 MG PO CPDR: 40.0000 mg | DELAYED_RELEASE_CAPSULE | Freq: Every day | ORAL | Status: DC

## 2013-06-29 NOTE — Progress Notes (Signed)
Subjective:    Patient ID: Carmen Green, female    DOB: [birth year], 63 y.o.   MRN: [id number]  HPI  Carmen Green is a pleasant 63 year old white female known to Dr. Delfin Edis. She has history of complete heart block is status post pacemaker placement, also has hypertension and a mild cardiomyopathy. She is followed for chronic GERD and also has history of esophageal stricture.. Patient last had colonoscopy in 2012 showing severe diverticulosis and a stricture at 15-20 cm. Last EGD was done in June of 2012 she biopsies of the GE junction done which were benign. She has more remote history of esophageal stricture with dilation in 2006.  She is maintained on Nexium 40 mg daily and comes in today for refill. She says as long as she takes her medication she does room well and has no symptoms. She currently has no complaints of dysphagia or odynophagia. No abnormal pain nausea etc. We discussed long-term use of PPIs and she has not had a bone density study done.  Review of Systems  Constitutional: Negative.   HENT: Negative.   Eyes: Negative.   Respiratory: Negative.   Cardiovascular: Negative.   Gastrointestinal: Negative.   Endocrine: Negative.   Genitourinary: Negative.   Musculoskeletal: Negative.   Skin: Negative.   Allergic/Immunologic: Negative.   Neurological: Negative.   Hematological: Negative.   Psychiatric/Behavioral: Negative.    Outpatient Prescriptions Prior to Visit  Medication Sig Dispense Refill  . aspirin EC 81 MG tablet Take 1 tablet (81 mg total) by mouth daily.      Marland Kitchen buPROPion (WELLBUTRIN XL) 300 MG 24 hr tablet Take 300 mg by mouth daily.        . busPIRone (BUSPAR) 10 MG tablet Take 10 mg by mouth 3 (three) times daily.        . cetirizine (ZYRTEC) 10 MG tablet Take 10 mg by mouth. Daily as needed       . estrogen, conjugated,-medroxyprogesterone (PREMPRO) 0.45-1.5 MG per tablet Take 1 tablet by mouth daily.        Marland Kitchen guaiFENesin (MUCINEX) 600 MG 12 hr tablet Take  1,200 mg by mouth. 1 tab once daily as needed       . lisinopril-hydrochlorothiazide (PRINZIDE,ZESTORETIC) 20-12.5 MG per tablet Take 1 tablet by mouth daily.        Marland Kitchen LORazepam (ATIVAN) 0.5 MG tablet Take 0.5 mg by mouth at bedtime.        . Multiple Vitamin (MULTIVITAMIN) tablet Take 1 tablet by mouth daily.        . rosuvastatin (CRESTOR) 10 MG tablet Take 10 mg by mouth daily.        Marland Kitchen esomeprazole (NEXIUM) 40 MG capsule Take 1 cap by mouth twice daily.  60 capsule  0   No facility-administered medications prior to visit.   Allergies  Allergen Reactions  . Cephalexin   . Penicillins    Patient Active Problem List   Diagnosis Date Noted  . CHEST PAIN-UNSPECIFIED 07/03/2010  . CARDIAC MURMUR 05/22/2010  . ANXIETY 11/07/2008  . Complete heart block 11/07/2008  . COLONIC POLYPS, HX OF 06/28/2008  . HYPERLIPIDEMIA 06/25/2008  . HYPERTENSION 06/25/2008  . MITRAL VALVE PROLAPSE 06/25/2008  . CARDIOMYOPATHY 06/25/2008  . ESOPHAGITIS 06/25/2008  . GERD 06/25/2008  . HIATAL HERNIA 06/25/2008  . Contusion of Chest Wall 10/10/2007   History  Substance Use Topics  . Smoking status: Never Smoker   . Smokeless tobacco: Never Used  . Alcohol Use: No  family history includes Diabetes in her mother; Heart disease in her maternal aunt and maternal grandmother. There is no history of Colon cancer.     Objective:   Physical Exam   And white female in no acute distress, pleasant blood pressure 130/70 pulse 66 height 5 foot weight 151. HEENT; nontraumatic normocephalic EOMI PERRLA sclera anicteric, Neck ;supple no JVD, Cardiovascular; regular rate and rhythm with S1-S2 no murmur rub or gallop, Pulmonary; clear bilaterally, Abdomen ;soft nontender nondistended bowel sounds are active there is no palpable mass or hepatosplenomegaly, Rectal; exam not done, Psych; mood and affect normal and appropriate       Assessment & Plan:  #33 63 year old female with chronic GERD and remote history of  esophageal dilation for stricture doing well on chronic Nexium #2 severe diverticulosis with history of perforated diverticulitis 2012 #3 history of complete heart block status post pacemaker placement  Plan; continue Nexium 40 mg by mouth daily chronically Will order a bone density study for baseline Followup with Dr. Delfin Edis in one year or sooner as needed

## 2013-06-29 NOTE — Patient Instructions (Signed)
You have been scheduled for a Bone Density Scan at [facility] 07/03/13 2:15 pm [address] [phone] Your Nexium has been refilled for 1 year. CC:  Octavio Graves MD

## 2013-06-30 NOTE — Progress Notes (Signed)
Reviewed and agree.

## 2013-07-03 ENCOUNTER — Other Ambulatory Visit (HOSPITAL_COMMUNITY): Payer: 59

## 2013-07-03 ENCOUNTER — Encounter: Payer: Self-pay | Admitting: *Deleted

## 2013-07-03 ENCOUNTER — Telehealth: Payer: Self-pay | Admitting: *Deleted

## 2013-07-03 NOTE — Telephone Encounter (Signed)
PT  CALLED,  RE  IS  SCHEDULED  FOR  BONE  DENSITY  TEST   WANTED TO KNOW IF OKAY  SINCE HAS  DEVICE  PER  DR  Lovena Le  PT  MAY HAVE  TEST  DONE .PT   NOTIFIED./CY

## 2013-07-05 ENCOUNTER — Encounter: Payer: Self-pay | Admitting: Internal Medicine

## 2013-07-06 ENCOUNTER — Ambulatory Visit (HOSPITAL_COMMUNITY)
Admission: RE | Admit: 2013-07-06 | Discharge: 2013-07-06 | Disposition: A | Discharge status: Home / Self Care | Payer: 59 | Source: Ambulatory Visit | Attending: Physician Assistant | Admitting: Physician Assistant

## 2013-07-06 DIAGNOSIS — Z13820 Encounter for screening for osteoporosis: Secondary | ICD-10-CM | POA: Insufficient documentation

## 2013-07-06 DIAGNOSIS — Z780 Asymptomatic menopausal state: Secondary | ICD-10-CM | POA: Insufficient documentation

## 2013-07-06 DIAGNOSIS — K219 Gastro-esophageal reflux disease without esophagitis: Secondary | ICD-10-CM

## 2013-07-30 ENCOUNTER — Telehealth: Payer: Self-pay | Admitting: Internal Medicine

## 2013-07-30 ENCOUNTER — Ambulatory Visit (INDEPENDENT_AMBULATORY_CARE_PROVIDER_SITE_OTHER): Payer: 59 | Admitting: Physician Assistant

## 2013-07-30 ENCOUNTER — Encounter: Payer: Self-pay | Admitting: Physician Assistant

## 2013-07-30 VITALS — BP 120/70 | HR 70 | Ht 60.5 in | Wt 150.4 lb

## 2013-07-30 DIAGNOSIS — K5732 Diverticulitis of large intestine without perforation or abscess without bleeding: Secondary | ICD-10-CM

## 2013-07-30 MED ORDER — FLUCONAZOLE 150 MG PO TABS: ORAL_TABLET | ORAL | Status: DC

## 2013-07-30 MED ORDER — METRONIDAZOLE 500 MG PO TABS: 500.0000 mg | ORAL_TABLET | Freq: Two times a day (BID) | ORAL | Status: DC

## 2013-07-30 MED ORDER — METRONIDAZOLE 500 MG PO TABS: 500.0000 mg | ORAL_TABLET | Freq: Two times a day (BID) | ORAL | Status: AC

## 2013-07-30 MED ORDER — CIPROFLOXACIN HCL 500 MG PO TABS: 500.0000 mg | ORAL_TABLET | Freq: Two times a day (BID) | ORAL | Status: DC

## 2013-07-30 MED ORDER — CIPROFLOXACIN HCL 500 MG PO TABS: 500.0000 mg | ORAL_TABLET | Freq: Two times a day (BID) | ORAL | Status: AC

## 2013-07-30 NOTE — Progress Notes (Signed)
Subjective:    Patient ID: Carmen Green, female    DOB: [birth year], 63 y.o.   MRN: [id number]  HPI  Mattilynn is a pleasant 63 year old white female known to Dr. Delfin Edis. She has history of complete heart block and is status post pacemaker placement, also with hypertension hyperlipidemia chronic GERD and history of colon polyps. She also has history of recurrent diverticulitis and last had an episode in 2012. Last colonoscopy was done in June of 2012 which showed severe diverticulosis of the left colon with a stricture at 15-20 cm in the sigmoid. No polyps. She comes in today with recurrent left lower quadrant pain which started on Friday to 6 2015. She says she had fairly sharp intense pain that evening associated with chills but no documented fever. She says over the weekend she continued to hurt though not quite as bad and did not have any further chills or fever. She put herself on a liquid diet. Her bowels been having fairly normally no melena or hematochezia. She has noticed ongoing discomfort particularly with coughing, walking ,movement and has some discomfort into her back. She does have a very remote history of kidney stones but has not had any urgency frequency hematuria etc. She says feels very similar to prior episodes of diverticulitis.    Review of Systems  Constitutional: Positive for chills and appetite change.  HENT: Negative.   Eyes: Negative.   Respiratory: Negative.   Cardiovascular: Negative.   Gastrointestinal: Positive for abdominal pain.  Endocrine: Negative.   Genitourinary: Negative.   Musculoskeletal: Negative.   Allergic/Immunologic: Negative.   Neurological: Negative.   Hematological: Negative.   Psychiatric/Behavioral: Negative.    Allergies  Allergen Reactions  . Cephalexin   . Penicillins        Outpatient Prescriptions Prior to Visit  Medication Sig Dispense Refill  . buPROPion (WELLBUTRIN XL) 300 MG 24 hr tablet Take 300 mg by mouth daily.         . busPIRone (BUSPAR) 10 MG tablet Take 10 mg by mouth 3 (three) times daily.        . cetirizine (ZYRTEC) 10 MG tablet Take 10 mg by mouth. Daily as needed       . esomeprazole (NEXIUM) 40 MG capsule Take 1 capsule (40 mg total) by mouth daily. Take 1 cap by mouth twice daily.  180 capsule  3  . estrogen, conjugated,-medroxyprogesterone (PREMPRO) 0.45-1.5 MG per tablet Take 1 tablet by mouth daily.        Marland Kitchen guaiFENesin (MUCINEX) 600 MG 12 hr tablet Take 1,200 mg by mouth. 1 tab once daily as needed       . lisinopril-hydrochlorothiazide (PRINZIDE,ZESTORETIC) 20-12.5 MG per tablet Take 1 tablet by mouth daily.        Marland Kitchen LORazepam (ATIVAN) 0.5 MG tablet Take 0.5 mg by mouth at bedtime.        . Multiple Vitamin (MULTIVITAMIN) tablet Take 1 tablet by mouth daily.        . rosuvastatin (CRESTOR) 10 MG tablet Take 10 mg by mouth daily.        Marland Kitchen aspirin EC 81 MG tablet Take 1 tablet (81 mg total) by mouth daily.       No facility-administered medications prior to visit.   Patient Active Problem List   Diagnosis Date Noted  . CHEST PAIN-UNSPECIFIED 07/03/2010  . CARDIAC MURMUR 05/22/2010  . ANXIETY 11/07/2008  . Complete heart block 11/07/2008  . COLONIC POLYPS, HX OF 06/28/2008  .  HYPERLIPIDEMIA 06/25/2008  . HYPERTENSION 06/25/2008  . MITRAL VALVE PROLAPSE 06/25/2008  . CARDIOMYOPATHY 06/25/2008  . ESOPHAGITIS 06/25/2008  . GERD 06/25/2008  . HIATAL HERNIA 06/25/2008  . Contusion of Chest Wall 10/10/2007   History  Substance Use Topics  . Smoking status: Never Smoker   . Smokeless tobacco: Never Used  . Alcohol Use: No    Objective:   Physical Exam well-developed older white female in no acute distress, pleasant accompanied by her husband blood pressure 120/70 pulse 70 height 5 foot weight 150. HEENT ;nontraumatic normocephalic EOMI PERRLA sclera anicteric, Supple ;no JVD, Cardiovascular; regular rate and rhythm with S1-S2 no murmur or gallop, Pulmonary clear bilaterally, Abdomen  soft she is tender in the left mid quadrant left lower quadrant there is no guarding or rebound no palpable mass or hepatosplenomegaly bowel sounds are present , Rectal ;exam not done, Extremities ;no clubbing cyanosis or edema skin warm and dry, Psych; mood and affect normal and appropriate        Assessment & Plan:  #1  63 old female with four-day history of recurrent left lower quadrant pain initially associated with chills. Patient has history of recurrent diverticulitis and exam is consistent with diverticulitis.  #2 chronic GERD stable  Plan; start Cipro 500 mg by mouth twice daily x14 days Start Flagyl 500 mg by mouth twice daily x14 days Offered an analgesic which she does not feel she needs at this time Gradually advanced to soft bland diet Diflucan 150 mg x1 for vaginal candidiasis when necessary and repeat in 3 days as needed Patient knows to call back if her symptoms worsen or she feels to resolve when she finishes the antibiotics.

## 2013-07-30 NOTE — Progress Notes (Signed)
Reviewed and agree.

## 2013-07-30 NOTE — Patient Instructions (Signed)
We sent presciptions to [place]. 1. Ciprofloxin 2. Flagyl ( Metronidazole )  3. Diflucan 150 mg  Full liquid diet today and gradually advance to soft bland diet .

## 2013-07-30 NOTE — Telephone Encounter (Signed)
Spoke with patient and she states she had LLQ pain starting on Friday. States the pain was bad on Friday. On Saturday, she had pain and chills. She did not take her temperature. She is some better today but still has the pain. Scheduled with Nicoletta Ba, PA today at 3:30 PM.

## 2013-08-22 ENCOUNTER — Telehealth: Payer: Self-pay | Admitting: Internal Medicine

## 2013-08-22 DIAGNOSIS — R109 Unspecified abdominal pain: Secondary | ICD-10-CM

## 2013-08-22 NOTE — Telephone Encounter (Signed)
Spoke with patient and she had been fine since seeing Nicoletta Ba, PA on 07/30/13. She completed the antibiotics and was doing great. This AM, she started having the same type pain that she had 07/30/13 and is running a low grade fever. No appointments available for extender rest of this week. Please, advise.

## 2013-08-22 NOTE — Telephone Encounter (Signed)
Scheduled Ct at [facility] on 08/23/13 at 10:00 AM. Contrast one bottle at 8:00 AM and 9:00 AM. NPO 4 hours prior. Patient aware. She will pick up contrast in AM. She will stay on clear liquids.

## 2013-08-22 NOTE — Telephone Encounter (Signed)
Please obtain CT scan of the abd/pelvis r/o diverticulitis. Oral and IV contrast. See how she does in next 24 hours before restarting antibiotics. Stay on clear liquids

## 2013-08-23 ENCOUNTER — Encounter: Payer: Self-pay | Admitting: Internal Medicine

## 2013-08-23 ENCOUNTER — Other Ambulatory Visit (INDEPENDENT_AMBULATORY_CARE_PROVIDER_SITE_OTHER): Payer: 59

## 2013-08-23 ENCOUNTER — Ambulatory Visit: Admission: RE | Admit: 2013-08-23 | Payer: 59 | Source: Ambulatory Visit

## 2013-08-23 ENCOUNTER — Ambulatory Visit (INDEPENDENT_AMBULATORY_CARE_PROVIDER_SITE_OTHER): Payer: 59 | Admitting: Internal Medicine

## 2013-08-23 ENCOUNTER — Ambulatory Visit (INDEPENDENT_AMBULATORY_CARE_PROVIDER_SITE_OTHER)
Admission: RE | Admit: 2013-08-23 | Discharge: 2013-08-23 | Disposition: A | Discharge status: Home / Self Care | Payer: 59 | Source: Ambulatory Visit | Attending: Internal Medicine | Admitting: Internal Medicine

## 2013-08-23 VITALS — BP 140/70 | HR 66 | Temp 97.8°F | Ht 61.0 in

## 2013-08-23 DIAGNOSIS — R109 Unspecified abdominal pain: Secondary | ICD-10-CM

## 2013-08-23 DIAGNOSIS — R112 Nausea with vomiting, unspecified: Secondary | ICD-10-CM

## 2013-08-23 DIAGNOSIS — K5732 Diverticulitis of large intestine without perforation or abscess without bleeding: Secondary | ICD-10-CM

## 2013-08-23 DIAGNOSIS — K5792 Diverticulitis of intestine, part unspecified, without perforation or abscess without bleeding: Secondary | ICD-10-CM

## 2013-08-23 LAB — BASIC METABOLIC PANEL
BUN: 16 mg/dL (ref 6–23)
CHLORIDE: 96 meq/L (ref 96–112)
CO2: 26 mEq/L (ref 19–32)
CREATININE: 0.8 mg/dL (ref 0.4–1.2)
Calcium: 9.6 mg/dL (ref 8.4–10.5)
GFR: 72.35 mL/min (ref >60.00)
Glucose, Bld: 104 mg/dL — ABNORMAL HIGH (ref 70–99)
Potassium: 4.4 mEq/L (ref 3.5–5.1)
Sodium: 129 mEq/L — ABNORMAL LOW (ref 135–145)

## 2013-08-23 LAB — CBC
HCT: 38.8 % (ref 36.0–46.0)
HEMOGLOBIN: 13.3 g/dL (ref 12.0–15.0)
MCHC: 34.3 g/dL (ref 30.0–36.0)
MCV: 95.2 fl (ref 78.0–100.0)
Platelets: 283 10*3/uL (ref 150.0–400.0)
RBC: 4.07 Mil/uL (ref 3.87–5.11)
RDW: 12.5 % (ref 11.5–14.6)
WBC: 16 10*3/uL — ABNORMAL HIGH (ref 4.5–10.5)

## 2013-08-23 MED ORDER — HYDROCODONE-ACETAMINOPHEN 5-325 MG PO TABS: 1.0000 | ORAL_TABLET | Freq: Four times a day (QID) | ORAL | Status: DC | PRN

## 2013-08-23 MED ORDER — METRONIDAZOLE 500 MG PO TABS: 500.0000 mg | ORAL_TABLET | Freq: Three times a day (TID) | ORAL | Status: DC

## 2013-08-23 MED ORDER — CIPROFLOXACIN HCL 500 MG PO TABS: 500.0000 mg | ORAL_TABLET | Freq: Two times a day (BID) | ORAL | Status: DC

## 2013-08-23 MED ORDER — IOHEXOL 300 MG/ML  SOLN
100.0000 mL | Freq: Once | INTRAMUSCULAR | Status: AC | PRN
  Administered 2013-08-23: 100 mL via INTRAVENOUS

## 2013-08-23 MED ORDER — PROMETHAZINE HCL 25 MG RE SUPP: 25.0000 mg | Freq: Four times a day (QID) | RECTAL | Status: DC | PRN

## 2013-08-23 MED ORDER — ONDANSETRON 8 MG PO TBDP: 8.0000 mg | ORAL_TABLET | Freq: Three times a day (TID) | ORAL | Status: DC | PRN

## 2013-08-23 NOTE — Patient Instructions (Signed)
We have sent the following medications to your pharmacy for you to pick up at your convenience: Z.ofran, Phenergan supp; ONLY USE IF ZOFRAN IS NOT HELPING. vicodin  Your physician has requested that you go to the basement for the following lab work before leaving today: CBC, BMET   Your CT has be rescheduled for 4:00pm today

## 2013-08-23 NOTE — Progress Notes (Signed)
Subjective:    Patient ID: Carmen Green, female    DOB: 03-Jul-1950, 63 y.o.   MRN: [id number]  HPI Mrs. Prevost is a 63 year old female known to Dr. Olevia Perches, seen in early February by Nicoletta Ba, PA-C and treated for diverticulitis who returns today with 24 hours of severe left lower quadrant abdominal pain, nausea and vomiting. She is here today with her husband. She has a history of complete heart block status post pacemaker placement, hypertension, hyperlipidemia, GERD and colon polyps. Last month she completed 14 days of ciprofloxacin and metronidazole. This did improve her pain it resolved completely. Pain started again yesterday. Described as sharp left lower quadrant abdominal pain which radiates across her abdomen. Associated as of this morning with nausea and vomiting. She's been unable to keep down fluids to this point because she did not have any anti-emetics at home. She was scheduled for CT today but came to the office because she was concerned she could not retain contrast. No bowel movement today, small nonbloody, non-melenic bowel movement yesterday. No fevers or chills. Appetite has been decreased. She would like to do whatever it takes to remain out of the hospital.  Review of Systems As per history of present illness, otherwise negative  Current Medications, Allergies, Past Medical History, Past Surgical History, Family History and Social History were reviewed in [employer] record.     Objective:   Physical Exam BP 140/70  Pulse 66  Temp(Src) 97.8 F (36.6 C)  Ht _0  (1.549 m) Constitutional: Well-developed and well-nourished. No distress. HEENT: Normocephalic and atraumatic. Oropharynx is clear and moist. No oropharyngeal exudate. Conjunctivae are normal.  No scleral icterus. Neck: Neck supple. Trachea midline. Cardiovascular: Normal rate, regular rhythm and intact distal pulses.  Pulmonary/chest: Effort normal and breath sounds normal. No  wheezing, rales or rhonchi. Abdominal: Soft, moderate to severe left lower quadrant tenderness without rebound or guarding, nondistended. Bowel sounds active throughout. Extremities: no clubbing, cyanosis, or edema Lymphadenopathy: No cervical adenopathy noted. Neurological: Alert and oriented to person place and time. Skin: Skin is warm and dry. No rashes noted. Psychiatric: Normal mood and affect. Behavior is normal.  CBC    Component Value Date/Time   WBC 16.0* 08/23/2013 0922   RBC 4.07 08/23/2013 0922   HGB 13.3 08/23/2013 0922   HCT 38.8 08/23/2013 0922   PLT 283.0 08/23/2013 0922   MCV 95.2 08/23/2013 0922   MCH 32.6 01/21/2011 1642   MCHC 34.3 08/23/2013 0922   RDW 12.5 08/23/2013 0922   LYMPHSABS 1.7 01/21/2011 1642   MONOABS 0.7 01/21/2011 1642   EOSABS 0.1 01/21/2011 1642   BASOSABS 0.1 01/21/2011 1642    CMP     Component Value Date/Time   NA 129* 08/23/2013 0922   K 4.4 08/23/2013 0922   CL 96 08/23/2013 0922   CO2 26 08/23/2013 0922   GLUCOSE 104* 08/23/2013 0922   BUN 16 08/23/2013 0922   CREATININE 0.8 08/23/2013 0922   CREATININE 0.79 01/21/2011 1642   CALCIUM 9.6 08/23/2013 0922   PROT 7.6 09/18/2010 1715   ALBUMIN 3.7 09/18/2010 1715   AST 18 09/18/2010 1715   ALT 14 09/18/2010 1715   ALKPHOS 53 09/18/2010 1715   BILITOT 0.9 09/18/2010 1715   GFRNONAA >60 09/20/2010 0405   GFRAA  Value: >60        The eGFR has been calculated using the MDRD equation. This calculation has not been validated in all clinical situations. eGFR's persistently <60 mL/min  signify possible Chronic Kidney Disease. 09/20/2010 0405        Assessment & Plan:  63 year old female known to Dr. Olevia Perches, seen in early February by Nicoletta Ba, PA-C and treated for diverticulitis who returns today with 24 hours of severe left lower quadrant abdominal pain, nausea and vomiting.  1. LLQ pain/probable recurrent diverticulitis -- I offered admission to the hospital but she would like to avoid this. For now she is nontoxic. Her white  count is elevated at 16, and she has a mild hyponatremia likely related to volume depletion. I have recommended antibiotics with ciprofloxacin 500 mg twice daily and metronidazole 500 mg 3 times daily. I will give her prescription for Zofran 8 mg ODT every 8 hours as needed for nausea and also promethazine 25 mg suppositories to be used every 6 hours as needed for nausea not relieved completely by Zofran. We will delay her CT, initially scheduled for this morning until this afternoon. I do think CT is important to rule out a complication from diverticulitis such as abscess formation. I let her know that if she is unable to maintain hydration by mouth or she is unable to retain her antibiotics, or if the pain worsens she will need to be admitted. She understands. Await CT results, and she understands that if this is found to be complicated diverticulitis she will need to be admitted. I have encouraged her to remain on a clear liquid diet for now. --Await CT results and plan admission if CT shows complication of diverticulitis or if she has worsening symptoms or unable to maintain by mouth hydration and antibiotics

## 2013-09-04 ENCOUNTER — Ambulatory Visit (INDEPENDENT_AMBULATORY_CARE_PROVIDER_SITE_OTHER): Payer: 59 | Admitting: Internal Medicine

## 2013-09-04 ENCOUNTER — Encounter: Payer: Self-pay | Admitting: Internal Medicine

## 2013-09-04 VITALS — BP 108/68 | HR 100 | Ht 60.5 in | Wt 142.5 lb

## 2013-09-04 DIAGNOSIS — R933 Abnormal findings on diagnostic imaging of other parts of digestive tract: Secondary | ICD-10-CM

## 2013-09-04 DIAGNOSIS — K5732 Diverticulitis of large intestine without perforation or abscess without bleeding: Secondary | ICD-10-CM

## 2013-09-04 NOTE — Patient Instructions (Addendum)
Barium Enema Prep  You have been scheduled for a Barium Enema.  Your procedure time is 11:00 am on Friday, 09/07/13. You should register in the radiology department at [facility] will need to arrive 15 minutes prior to your scheduled appointment time.  Be prepared to spend 1 to 2  hours in the Radiology Department. You will need to purchase a bottle of Magnesium Citrate, Dulcolax tablets and Dulcolax suppository from the laxative section of your drug store.    It is very important to follow all the below instructions.  Failure to follow these instructions may result in a study that is less than optimal and may lead to the necessity of repeating the examination.    On the day before your examination:09/06/13 12:00 Lunch- This meal may include clear broth, white chicken meat sandwich, (no butter, lettuce or other additives), or two hard boiled eggs, strained fruit juice, jello (not containing nuts of fruit).  Coffee,  tea (no milk or cream), water or carbonated beverages.    1:00 pm Drink one full glass or more of water  3:00 pm Drink one full glass or more of water  5:00 pm Have a clear liquid supper.  Clear liquids include: water, ice, tea/coffee (sugar is ok, but no milk or cream), juice (apple, white grape, white cranberry), clear bullion, consomme, broth, strained chicken noodle soup, jello, popsicles, powdered fruit flavored drinks, gatorade, lemonade, carbonated beverages, hard candy.  7:00 pm Drink one full or more glass of water  8:00  Pm Drink a bottle of Magnesium Citrate  10:00 pm Take 3 DULCOLAX tablets with one full glass or more of water  On the Day of your examination: 09/07/13  No breakfast except coffee or tea (without milk or cream) clear strained fruit juice  7:00 am Drink one full glass of water.  Insert the DULCOLAX Suppository into the rectum.  _________________________________________________________________________________  You have been scheduled for  an appointment with Dr Johney Maine at [facility]. Your appointment is on Thursday 09/13/13 at 11:00 am. Please arrive at 10:30 am for registration. Make certain to bring a list of current medications, including any over the counter medications or vitamins. Also bring your co-pay if you have one as well as your insurance cards. [facility] is located at [address]. Should you need to reschedule your appointment, please contact them at [phone].  Cc:Dr Gross, Dr Melina Copa

## 2013-09-04 NOTE — Progress Notes (Signed)
Carmen Green 1950/12/12 [id number]  Note: This dictation was prepared with Dragon digital system. Any transcriptional errors that result from this procedure are unintentional.   History of Present Illness:  This is a 63 year old white female with acute diverticulitis of the sigmoid colon. She has had 2 recent attacks; one in February 2015 which responded to Cipro and Flagyl and the second on March 5 with recurrence of left lower quadrant abdominal pain and radiographic evidence of pericolonic stranding in the left lower quadrant consistent with diverticulitis. She has a history of perforated diverticulitis in June 2012 resulting in a sigmoid stricture. A CT scan of the abdomen 2 weeks ago showed a fluid collection in the left pelvic sidewall of 3.3x2.4 cm of questionable origin, possibly in the left ovary versus abscess 2 dary to diverticulitis versus old scar tissue from prior diverticulitis. She completed Flagyl and Cipro 3 days ago and she is feeling much better. There is still residual mild tenderness in the left lower quadrant. She denies pneumaturia or rectal bleeding. Her last colonoscopy in June 2012 showed a partial obstruction of the sigmoid colon. She has a history of Barrett's esophagus in January 2010, not documented on a subsequent endoscopy in June 2012.    Past Medical History  Diagnosis Date  . Hypertension   . Complete heart block   . Hyperlipidemia     s/p PPM implant by Dr Olevia Perches 1992 with generator change 2004 ([facility]), she is device dependant  . Mitral valve prolapse   . Anxiety   . Hiatal hernia   . GERD (gastroesophageal reflux disease)   . Colon polyp   . Esophagitis   . Diverticulitis     Past Surgical History  Procedure Laterality Date  . Left ovary cyst removal  1972  . Pacemaker insertion  1992, 2004, 2012    device dependant  . Tubal ligation    . Colonoscopy  10/19/2004    [facility]  . Polypectomy    . Appendectomy      Allergies  Allergen Reactions  .  Cephalexin   . Penicillins     Family history and social history have been reviewed.  Review of Systems: Denies fever rectal bleeding  The remainder of the 10 point ROS is negative except as outlined in the H&P  Physical Exam: General Appearance Well developed, in no distress Eyes  Non icteric  HEENT  Non traumatic, normocephalic  Mouth No lesion, tongue papillated, no cheilosis Neck Supple without adenopathy, thyroid not enlarged, no carotid bruits, no JVD Lungs Clear to auscultation bilaterally COR Normal S1, normal S2, regular rhythm, no murmur, quiet precordium Abdomen mild tenderness in left lower quadrant. No rebound no distention. Normal active bowel sounds Rectal not done Extremities  No pedal edema Skin No lesions Neurological Alert and oriented x 3 Psychological Normal mood and affect  Assessment and Plan:   Problem #35 63 year old white female with recurrent diverticulitis. This was the third attack documented on imaging. She has a history of perforated diverticulitis in 2012. I made a suggestion for her to undergo a segmental sigmoid resection. We have discussed recurrent diverticulitis and the possibility of an emergency colostomy. We will proceed with a barium enema to assess the extent and the severity of the diverticular disease as well as for walled off possible extravasation of the barium She will be referred for a surgical opinion for consideration of laparoscopic assisted sigmoid resection.  Problem #2 Barrett's esophagus. Patient's last endoscopy in 2012 did not confirm Barrett's  esophagus. A recall endoscopy will be due in 5 years.    Delfin Edis 09/04/2013

## 2013-09-06 ENCOUNTER — Telehealth: Payer: Self-pay | Admitting: *Deleted

## 2013-09-06 NOTE — Telephone Encounter (Signed)
Message copied by Hulan Saas on Thu Sep 06, 2013  1:18 PM ------      Message from: Lafayette Dragon      Created: Thu Sep 06, 2013 12:29 PM       Rollene Fare, I will leave that up to the surgeo to decide.      ----- Message -----         From: Hulan Saas, RN         Sent: 09/06/2013  11:32 AM           To: Lafayette Dragon, MD            Dr. Olevia Perches,      I had a note to myself to schedule patient for Ultrasound of pelvis/vagina due to abnormality on 08/23/12 CT. I see that you saw her on 09/04/13 and she is scheduled for a barium enema and surgical consult. Does she still need the ultrasound?      Quitman Norberto       ------

## 2013-09-07 ENCOUNTER — Ambulatory Visit (HOSPITAL_COMMUNITY)
Admission: RE | Admit: 2013-09-07 | Discharge: 2013-09-07 | Disposition: A | Discharge status: Home / Self Care | Payer: 59 | Source: Ambulatory Visit | Attending: Internal Medicine | Admitting: Internal Medicine

## 2013-09-07 DIAGNOSIS — K5730 Diverticulosis of large intestine without perforation or abscess without bleeding: Secondary | ICD-10-CM | POA: Insufficient documentation

## 2013-09-07 DIAGNOSIS — K5732 Diverticulitis of large intestine without perforation or abscess without bleeding: Secondary | ICD-10-CM

## 2013-09-13 ENCOUNTER — Ambulatory Visit (INDEPENDENT_AMBULATORY_CARE_PROVIDER_SITE_OTHER): Payer: Commercial Managed Care - PPO | Admitting: Surgery

## 2013-09-13 ENCOUNTER — Encounter (INDEPENDENT_AMBULATORY_CARE_PROVIDER_SITE_OTHER): Payer: Self-pay | Admitting: Surgery

## 2013-09-13 ENCOUNTER — Encounter (INDEPENDENT_AMBULATORY_CARE_PROVIDER_SITE_OTHER): Payer: Self-pay

## 2013-09-13 VITALS — BP 142/80 | HR 86 | Temp 98.0°F | Resp 14 | Ht 61.0 in | Wt 141.8 lb

## 2013-09-13 DIAGNOSIS — F411 Generalized anxiety disorder: Secondary | ICD-10-CM

## 2013-09-13 DIAGNOSIS — K5732 Diverticulitis of large intestine without perforation or abscess without bleeding: Secondary | ICD-10-CM

## 2013-09-13 MED ORDER — NEOMYCIN SULFATE 500 MG PO TABS: 1000.0000 mg | ORAL_TABLET | ORAL | Status: DC

## 2013-09-13 MED ORDER — METRONIDAZOLE 500 MG PO TABS: 500.0000 mg | ORAL_TABLET | ORAL | Status: DC

## 2013-09-13 NOTE — Progress Notes (Signed)
Subjective:     Patient ID: Carmen Green, female   DOB: [birth year], 63 y.o.   MRN: [id number]  HPI  Note: This dictation was prepared with Dragon/digital dictation along with [facility] technology. Any transcriptional errors that result from this process are unintentional.       Carmen Green  April 15, 1951 [id number]  Patient Care Team: Octavio Graves, DO as PCP - General Altamese Dilling, RN as Registered Nurse Lafayette Dragon, MD as Consulting Physician (Gastroenterology) Adin Hector, MD as Consulting Physician (General Surgery) Larey Dresser, MD as Consulting Physician (Cardiology)  This patient is a 63 y.o.female who presents today for surgical evaluation at the request of Dr. Olevia Perches.   Reason for visit: Recurrent sigmoid diverticulitis.  Live female.  She comes today with her husband.  History of cardiac dysrhythmias control is a pacemaker for over 20 years.  No evidence for that aside from changing the batteries out.  Followed by cardiology in town.  She an episode of severe pain in 2012.  Diagnosed with diverticulitis.  Left lower quadrant and flank pain.  Radiating to left groin.  Nausea and vomiting.  She had a 3-1/2 cm abscess.  She required admission and IV antibiotics.  Eventually had improved.  Endoscopy revealed diverticulosis.  Perhaps mild narrowing but no severe stricture.  Had done well until last month when had a severe episode of pain.  Worse.  Concern for diverticulitis.  Refused admission.  Eventually improved on oral antibiotics.  However she had recurrent attack earlier this month, a few weeks after this.  The most intense.  Required oral narcotics.  Eventually improved with 10 days of oral ciprofloxacin/metronidazole.  She has been off antibiotics for a week.  She never wants to go through another attack.  Her gastroenterologist recommended considering surgery.  Therefore they come in today.  She normally has a bowel movement 2 times a day.  She can walk a  half hour without difficulty.  She had a tubal ligation 1985 & open ovarian cyst removal in the 1971.  No other abdominal surgeries.  She has never smoked.  No history of MRSA or skin infections.  She is not on any anticoagulation.  Patient Active Problem List   Diagnosis Date Noted  . Diverticulitis of colon - recurrent 09/13/2013  . CHEST PAIN-UNSPECIFIED 07/03/2010  . CARDIAC MURMUR 05/22/2010  . ANXIETY 11/07/2008  . Complete heart block 11/07/2008  . COLONIC POLYPS, HX OF 06/28/2008  . HYPERLIPIDEMIA 06/25/2008  . HYPERTENSION 06/25/2008  . MITRAL VALVE PROLAPSE 06/25/2008  . CARDIOMYOPATHY 06/25/2008  . ESOPHAGITIS 06/25/2008  . GERD 06/25/2008  . HIATAL HERNIA 06/25/2008  . Contusion of Chest Wall 10/10/2007    Past Medical History  Diagnosis Date  . Hypertension   . Complete heart block   . Hyperlipidemia     s/p PPM implant by Dr Olevia Perches 1992 with generator change 2004 ([facility]), she is device dependant  . Mitral valve prolapse   . Anxiety   . Hiatal hernia   . GERD (gastroesophageal reflux disease)   . Colon polyp   . Esophagitis   . Diverticulitis     Past Surgical History  Procedure Laterality Date  . Left ovary cyst removal  1972  . Pacemaker insertion  1992, 2004, 2012    device dependant  . Tubal ligation    . Colonoscopy  10/19/2004    [facility]  . Polypectomy    . Appendectomy      History   Social  History  . Marital Status: Married    Spouse Name: N/A    Number of Children: 1  . Years of Education: N/A   Occupational History  . REGISTRATION    Social History Main Topics  . Smoking status: Never Smoker   . Smokeless tobacco: Never Used  . Alcohol Use: No  . Drug Use: No  . Sexual Activity: Not on file   Other Topics Concern  . Not on file   Social History Narrative   Lives with spouse in [place]   Works for an orthopoedic office in [place]          Family History  Problem Relation Age of Onset  . Diabetes Mother   . Stroke Mother     . Heart disease Maternal Aunt   . Heart disease Maternal Grandmother   . Colon cancer Neg Hx   . Diabetes Father     lung cancer  . Cancer Maternal Grandfather     lung    Current Outpatient Prescriptions  Medication Sig Dispense Refill  . buPROPion (WELLBUTRIN XL) 300 MG 24 hr tablet Take 300 mg by mouth daily.        . busPIRone (BUSPAR) 10 MG tablet Take 10 mg by mouth 3 (three) times daily.        . cetirizine (ZYRTEC) 10 MG tablet Take 10 mg by mouth. Daily as needed       . esomeprazole (NEXIUM) 40 MG capsule Take 1 capsule (40 mg total) by mouth daily. Take 1 cap by mouth twice daily.  180 capsule  3  . estrogen, conjugated,-medroxyprogesterone (PREMPRO) 0.45-1.5 MG per tablet Take 1 tablet by mouth daily.        . fluticasone (FLONASE) 50 MCG/ACT nasal spray Place 1 spray into both nostrils daily.      Marland Kitchen lisinopril-hydrochlorothiazide (PRINZIDE,ZESTORETIC) 20-12.5 MG per tablet Take 1 tablet by mouth daily.        Marland Kitchen LORazepam (ATIVAN) 0.5 MG tablet Take 0.5 mg by mouth at bedtime.        . Multiple Vitamin (MULTIVITAMIN) tablet Take 1 tablet by mouth daily.        . rosuvastatin (CRESTOR) 10 MG tablet Take 10 mg by mouth daily.        Marland Kitchen guaiFENesin (MUCINEX) 600 MG 12 hr tablet Take 1,200 mg by mouth. 1 tab once daily as needed       . HYDROcodone-acetaminophen (NORCO/VICODIN) 5-325 MG per tablet Take 1 tablet by mouth every 6 (six) hours as needed for moderate pain.  30 tablet  0  . ondansetron (ZOFRAN ODT) 8 MG disintegrating tablet Take 1 tablet (8 mg total) by mouth every 8 (eight) hours as needed for nausea or vomiting.  25 tablet  0  . promethazine (PHENERGAN) 25 MG suppository Place 1 suppository (25 mg total) rectally every 6 (six) hours as needed for nausea or vomiting.  12 each  0   No current facility-administered medications for this visit.     Allergies  Allergen Reactions  . Cephalexin   . Penicillins     BP 142/80  Pulse 86  Temp(Src) 98 F (36.7 C)   Resp 14  Ht 5\' 1"  (1.549 m)  Wt 141 lb 12.8 oz (64.32 kg)  BMI 26.81 kg/m2  Ct Abdomen Pelvis W Contrast  08/23/2013   CLINICAL DATA:  Left lower quadrant abdominal pain, nausea, vomiting, slight fever  EXAM: CT ABDOMEN AND PELVIS WITH CONTRAST  TECHNIQUE: Multidetector CT imaging of  the abdomen and pelvis was performed using the standard protocol following bolus administration of intravenous contrast.  CONTRAST:  177mL OMNIPAQUE IOHEXOL 300 MG/ML  SOLN  COMPARISON:  09/21/2010  FINDINGS: Punctate tiny subpleural calcified granulomas in the right lower lobe. Lung bases otherwise clear. Pacer wires noted in the right side of the heart. Normal heart size. No pericardial or pleural effusion. Small hiatal hernia evident.  Abdomen: Liver demonstrates focal fatty infiltration along the falciform ligament as before, image 21. No other hepatic abnormality. No biliary dilatation. Patent portal and hepatic veins. Gallbladder, biliary system, pancreas, spleen, accessory splenules, adrenal glands, and kidneys are within normal limits for age and demonstrate no acute finding.  Negative for bowel obstruction, dilatation, ileus, or free air.  Aortic atherosclerosis noted without significant aneurysm. No abdominal free fluid, fluid collection, hemorrhage, abscess, or adenopathy.  Pelvis: Diffuse sigmoid diverticulosis noted. Minimal pericolonic strandy edema in the left lower quadrant compatible with mild diverticulitis. Along the left pelvic sidewall, there is a peripherally enhancing low-attenuation abnormality versus fluid collection or complex cyst. This measures 3.3 x 2.4 cm, image 64. This appears to involve the ovary or extend into the adnexae. This could represent a small adjacent diverticular abscess. Other consideration would be a cystic ovarian abnormality.  No dependent pelvic free fluid, hemorrhage, an abscess, inguinal abnormality, or hernia.  Urinary bladder is unremarkable. Uterus is midline. Right ovary is  normal in size for age.  Degenerative changes noted of the spine.  IMPRESSION: Mild acute left lower quadrant sigmoid diverticulitis.  Left pelvic sidewall peripherally enhancing fluid collection or cystic abnormality could represent a small contained abscess extending into the left adnexa. Other consideration would be a cystic ovarian lesion.  Recommend short-term follow-up after medical treatment to document resolution and to exclude a cystic ovarian lesion in this postmenopausal female.  These results will be called to the ordering clinician or representative by the Radiologist Assistant, and communication documented in the PACS Dashboard.   Electronically Signed   By: Daryll Brod M.D.   On: 08/23/2013 17:24   Dg Colon Alonza Bogus Hi Den Cm  09/07/2013   CLINICAL DATA:  History of diverticulitis  EXAM: AIR CONTRAST BARIUM ENEMA  TECHNIQUE: Initial scout AP supine abdominal image obtained to insure adequate colon cleansing. Barium was introduced into the colon in a retrograde fashion and refluxed from the rectum to the distal transverse colon. As much of the barium as possible was then removed through the indwelling tube via gravity drain. Air was then insufflated into the colon. Spot images of the colon followed by overhead radiographs were obtained.  FLUOROSCOPY TIME:  43 seconds  COMPARISON:  None.  FINDINGS: An enema catheter was inserted and retention balloon distended under fluoroscopy. Barium followed by air was then introduced with satisfactory demonstration of the colon.  Examination was negative for filling defects to indicate the presence of a polyp or mass lesion. There was no evidence of colonic stricture or obstruction. The mucosal pattern was normal. No tethering, inflammatory changes, or definite ulcerations identified. Multiple uncomplicated sigmoid diverticula without evidence of diverticulitis or diverticular disease. No contrast extravasation.  IMPRESSION: 1. Sigmoid diverticulosis. 2. No  colonic stricture, polyp or mass lesion.   Electronically Signed   By: Kathreen Devoid   On: 09/07/2013 16:07     Review of Systems  Constitutional: Negative for fever, chills, diaphoresis, appetite change and fatigue.  HENT: Negative for ear discharge, ear pain, sore throat and trouble swallowing.   Eyes: Negative for photophobia,  discharge and visual disturbance.  Respiratory: Negative for cough, choking, chest tightness and shortness of breath.   Cardiovascular: Negative for chest pain and palpitations.  Gastrointestinal: Positive for abdominal pain. Negative for nausea, vomiting, diarrhea, constipation, anal bleeding and rectal pain.  Endocrine: Negative for cold intolerance and heat intolerance.  Genitourinary: Negative for dysuria, frequency and difficulty urinating.  Musculoskeletal: Negative for gait problem, myalgias and neck pain.  Skin: Negative for color change, pallor and rash.  Allergic/Immunologic: Negative for environmental allergies, food allergies and immunocompromised state.  Neurological: Negative for dizziness, speech difficulty, weakness and numbness.  Hematological: Negative for adenopathy.  Psychiatric/Behavioral: Negative for confusion and agitation. The patient is not nervous/anxious.        Objective:   Physical Exam  Constitutional: She is oriented to person, place, and time. She appears well-developed and well-nourished. No distress.  HENT:  Head: Normocephalic.  Mouth/Throat: Oropharynx is clear and moist. No oropharyngeal exudate.  Eyes: Conjunctivae and EOM are normal. Pupils are equal, round, and reactive to light. No scleral icterus.  Neck: Normal range of motion. Neck supple. No tracheal deviation present.  Cardiovascular: Normal rate, regular rhythm and intact distal pulses.   Pulmonary/Chest: Effort normal and breath sounds normal. No stridor. No respiratory distress. She exhibits no tenderness.  Abdominal: Soft. She exhibits no distension and no  mass. There is no tenderness. There is no rigidity, no rebound, no guarding, no CVA tenderness, no tenderness at McBurney's point and negative Murphy's sign. Hernia confirmed negative in the ventral area, confirmed negative in the right inguinal area and confirmed negative in the left inguinal area.    Genitourinary: No vaginal discharge found.  Musculoskeletal: Normal range of motion. She exhibits no tenderness.       Right elbow: She exhibits normal range of motion.       Left elbow: She exhibits normal range of motion.       Right wrist: She exhibits normal range of motion.       Left wrist: She exhibits normal range of motion.       Right hand: Normal strength noted.       Left hand: Normal strength noted.  Lymphadenopathy:       Head (right side): No posterior auricular adenopathy present.       Head (left side): No posterior auricular adenopathy present.    She has no cervical adenopathy.    She has no axillary adenopathy.       Right: No inguinal adenopathy present.       Left: No inguinal adenopathy present.  Neurological: She is alert and oriented to person, place, and time. No cranial nerve deficit. She exhibits normal muscle tone. Coordination normal.  Skin: Skin is warm and dry. No rash noted. She is not diaphoretic. No erythema.  Psychiatric: She has a normal mood and affect. Her behavior is normal. Judgment and thought content normal.       Assessment:     At least 3 documented episodes of sigmoid diverticulitis.  One with abscess.  One recurrent.  Risk of recurrent attacks very high.     Plan:     My instinct says she would benefit from resection of her sigmoid diverticulitis and she has had 2 attacks in the past 6 weeks.  She is very afraid of getting another attack.  She is worried about needing emergency surgery and a colostomy.  She wishes to be aggressive and do colectomy.  Her husband agrees.  I think she would  be a good candidate for a minimally invasive approach.   Start out robotically:  The anatomy & physiology of the digestive tract was discussed.  The pathophysiology of the colon was discussed.  Natural history risks without surgery was discussed.   I feel the risks of no intervention will lead to serious problems that outweigh the operative risks; therefore, I recommended a partial colectomy to remove the pathology.  Minimally invasive (Robotic/Laparoscopic) & open techniques were discussed.   Risks such as bleeding, infection, abscess, leak, reoperation, possible ostomy, hernia, heart attack, death, and other risks were discussed.  I noted a good likelihood this will help address the problem.   Goals of post-operative recovery were discussed as well.   Need for bowel regimen and healthy physical activity to optimize recovery noted as well. We will work to minimize complications.  Educational materials were given as well.  Questions were answered.  The patient expresses understanding & wishes to proceed with surgery.  Given her history of dysrhythmias with a pacemaker, I would like cardiac clearance.  Hopefully just a letter.  We will need to make sure that [employer] or pacemaker rep is available to help manage perioperatively with anesthesia.  I recommended obtaining preoperative cardiac clearance.  I am concerned about the health of the patient and the ability to tolerate the operation.  Therefore, we will request clearance by cardiology to better assess operative risk & see if a reevaluation, further workup, adjustment to medications, etc is needed.

## 2013-09-13 NOTE — Patient Instructions (Signed)
Please consider the recommendations that we have given you today:  Consider surgery to remove the section of your colon with recurrent diverticulitis (robotically assisted minimally invasive sigmoid colectomy)  See the Handout(s) we have given you.  Please call our office at [phone] if you wish to schedule surgery or if you have further questions / concerns.   Diverticulitis A diverticulum is a small pouch or sac on the colon. Diverticulosis is the presence of these diverticula on the colon. Diverticulitis is the irritation (inflammation) or infection of diverticula. CAUSES  The colon and its diverticula contain bacteria. If food particles block the tiny opening to a diverticulum, the bacteria inside can grow and cause an increase in pressure. This leads to infection and inflammation and is called diverticulitis. SYMPTOMS   Abdominal pain and tenderness. Usually, the pain is located on the left side of your abdomen. However, it could be located elsewhere.  Fever.  Bloating.  Feeling sick to your stomach (nausea).  Throwing up (vomiting).  Abnormal stools. DIAGNOSIS  Your caregiver will take a history and perform a physical exam. Since many things can cause abdominal pain, other tests may be necessary. Tests may include:  Blood tests.  Urine tests.  X-ray of the abdomen.  CT scan of the abdomen. Sometimes, surgery is needed to determine if diverticulitis or other conditions are causing your symptoms. TREATMENT  Most of the time, you can be treated without surgery. Treatment includes:  Resting the bowels by only having liquids for a few days. As you improve, you will need to eat a low-fiber diet.  Intravenous (IV) fluids if you are losing body fluids (dehydrated).  Antibiotic medicines that treat infections may be given.  Pain and nausea medicine, if needed.  Surgery if the inflamed diverticulum has burst. HOME CARE INSTRUCTIONS   Try a clear liquid diet (broth,  tea, or water for as long as directed by your caregiver). You may then gradually begin a low-fiber diet as tolerated.  A low-fiber diet is a diet with less than 10 grams of fiber. Choose the foods below to reduce fiber in the diet:  White breads, cereals, rice, and pasta.  Cooked fruits and vegetables or soft fresh fruits and vegetables without the skin.  Ground or well-cooked tender beef, ham, veal, lamb, pork, or poultry.  Eggs and seafood.  After your diverticulitis symptoms have improved, your caregiver may put you on a high-fiber diet. A high-fiber diet includes 14 grams of fiber for every 1000 calories consumed. For a standard 2000 calorie diet, you would need 28 grams of fiber. Follow these diet guidelines to help you increase the fiber in your diet. It is important to slowly increase the amount fiber in your diet to avoid gas, constipation, and bloating.  Choose whole-grain breads, cereals, pasta, and brown rice.  Choose fresh fruits and vegetables with the skin on. Do not overcook vegetables because the more vegetables are cooked, the more fiber is lost.  Choose more nuts, seeds, legumes, dried peas, beans, and lentils.  Look for food products that have greater than 3 grams of fiber per serving on the Nutrition Facts label.  Take all medicine as directed by your caregiver.  If your caregiver has given you a follow-up appointment, it is very important that you go. Not going could result in lasting (chronic) or permanent injury, pain, and disability. If there is any problem keeping the appointment, call to reschedule. SEEK MEDICAL CARE IF:   Your pain does not improve.  You have a hard time advancing your diet beyond clear liquids.  Your bowel movements do not return to normal. SEEK IMMEDIATE MEDICAL CARE IF:   Your pain becomes worse.  You have an oral temperature above 102 F (38.9 C), not controlled by medicine.  You have repeated vomiting.  You have bloody or black,  tarry stools.  Symptoms that brought you to your caregiver become worse or are not getting better. MAKE SURE YOU:   Understand these instructions.  Will watch your condition.  Will get help right away if you are not doing well or get worse. Document Released: 03/17/2005 Document Revised: 08/30/2011 Document Reviewed: 07/13/2010 [facility] Patient Information 2014 [place].  ABDOMINAL SURGERY: POST OP INSTRUCTIONS  1. DIET: Follow a light bland diet the first 24 hours after arrival home, such as soup, liquids, crackers, etc.  Be sure to include lots of fluids daily.  Avoid fast food or heavy meals as your are more likely to get nauseated.  Eat a low fat the next few days after surgery.   2. Take your usually prescribed home medications unless otherwise directed. 3. PAIN CONTROL: a. Pain is best controlled by a usual combination of three different methods TOGETHER: i. Ice/Heat ii. Over the counter pain medication iii. Prescription pain medication b. Most patients will experience some swelling and bruising around the incisions.  Ice packs or heating pads (30-60 minutes up to 6 times a day) will help. Use ice for the first few days to help decrease swelling and bruising, then switch to heat to help relax tight/sore spots and speed recovery.  Some people prefer to use ice alone, heat alone, alternating between ice & heat.  Experiment to what works for you.  Swelling and bruising can take several weeks to resolve.   c. It is helpful to take an over-the-counter pain medication regularly for the first few weeks.  Choose one of the following that works best for you: i. Naproxen (Aleve, etc)  Two 220mg  tabs twice a day ii. Ibuprofen (Advil, etc) Three 200mg  tabs four times a day (every meal & bedtime) iii. Acetaminophen (Tylenol, etc) 500-650mg  four times a day (every meal & bedtime) d. A  prescription for pain medication (such as oxycodone, hydrocodone, etc) should be given to you upon  discharge.  Take your pain medication as prescribed.  i. If you are having problems/concerns with the prescription medicine (does not control pain, nausea, vomiting, rash, itching, etc), please call us [phone] to see if we need to switch you to a different pain medicine that will work better for you and/or control your side effect better. ii. If you need a refill on your pain medication, please contact your pharmacy.  They will contact our office to request authorization. Prescriptions will not be filled after 5 pm or on week-ends. 4. Avoid getting constipated.  Between the surgery and the pain medications, it is common to experience some constipation.  Increasing fluid intake and taking a fiber supplement (such as Metamucil, Citrucel, FiberCon, MiraLax, etc) 1-2 times a day regularly will usually help prevent this problem from occurring.  A mild laxative (prune juice, Milk of Magnesia, MiraLax, etc) should be taken according to package directions if there are no bowel movements after 48 hours.   5. Watch out for diarrhea.  If you have many loose bowel movements, simplify your diet to bland foods & liquids for a few days.  Stop any stool softeners and decrease your fiber supplement.  Switching to  mild anti-diarrheal medications (Kayopectate, Pepto Bismol) can help.  If this worsens or does not improve, please call us. 6. Wash / shower every day.  You may shower over the incision / wound.  Avoid baths until the skin is fully healed.  Continue to shower over incision(s) after the dressing is off. 7. Remove your waterproof bandages 5 days after surgery.  You may leave the incision open to air.  You may replace a dressing/Band-Aid to cover the incision for comfort if you wish. 8. ACTIVITIES as tolerated:   a. You may resume regular (light) daily activities beginning the next day-such as daily self-care, walking, climbing stairs-gradually increasing activities as tolerated.  If you can walk 30 minutes  without difficulty, it is safe to try more intense activity such as jogging, treadmill, bicycling, low-impact aerobics, swimming, etc. b. Save the most intensive and strenuous activity for last such as sit-ups, heavy lifting, contact sports, etc  Refrain from any heavy lifting or straining until you are off narcotics for pain control.   c. DO NOT PUSH THROUGH PAIN.  Let pain be your guide: If it hurts to do something, don't do it.  Pain is your body warning you to avoid that activity for another week until the pain goes down. d. You may drive when you are no longer taking prescription pain medication, you can comfortably wear a seatbelt, and you can safely maneuver your car and apply brakes. e. Dennis Bast may have sexual intercourse when it is comfortable.  9. FOLLOW UP in our office a. Please call [facility] at [phone] to set up an appointment to see your surgeon in the office for a follow-up appointment approximately 1-2 weeks after your surgery. b. Make sure that you call for this appointment the day you arrive home to insure a convenient appointment time. 10. IF YOU HAVE DISABILITY OR FAMILY LEAVE FORMS, BRING THEM TO THE OFFICE FOR PROCESSING.  DO NOT GIVE THEM TO YOUR DOCTOR.   WHEN TO CALL us [phone]: 1. Poor pain control 2. Reactions / problems with new medications (rash/itching, nausea, etc)  3. Fever over 101.5 F (38.5 C) 4. Inability to urinate 5. Nausea and/or vomiting 6. Worsening swelling or bruising 7. Continued bleeding from incision. 8. Increased pain, redness, or drainage from the incision  The clinic staff is available to answer your questions during regular business hours (8:30am-5pm).  Please don't hesitate to call and ask to speak to one of our nurses for clinical concerns.   A surgeon from [facility] is always on call at the hospitals   If you have a medical emergency, go to the nearest emergency room or call 911.    [facility], [address], [place], [place],   [address] ? MAIN: [phone] ? TOLL FREE: [phone] ? FAX [phone] [email or web address]  GETTING TO GOOD BOWEL HEALTH. Irregular bowel habits such as constipation and diarrhea can lead to many problems over time.  Having one soft bowel movement a day is the most important way to prevent further problems.  The anorectal canal is designed to handle stretching and feces to safely manage our ability to get rid of solid waste (feces, poop, stool) out of our body.  BUT, hard constipated stools can act like ripping concrete bricks and diarrhea can be a burning fire to this very sensitive area of our body, causing inflamed hemorrhoids, anal fissures, increasing risk is perirectal abscesses, abdominal pain/bloating, an making irritable bowel worse.  The goal: ONE SOFT BOWEL MOVEMENT A DAY!  To have soft, regular bowel movements:    Drink at least 8 tall glasses of water a day.     Take plenty of fiber.  Fiber is the undigested part of plant food that passes into the colon, acting s "natures broom" to encourage bowel motility and movement.  Fiber can absorb and hold large amounts of water. This results in a larger, bulkier stool, which is soft and easier to pass. Work gradually over several weeks up to 6 servings a day of fiber (25g a day even more if needed) in the form of: o Vegetables -- Root (potatoes, carrots, turnips), leafy green (lettuce, salad greens, celery, spinach), or cooked high residue (cabbage, broccoli, etc) o Fruit -- Fresh (unpeeled skin & pulp), Dried (prunes, apricots, cherries, etc ),  or stewed ( applesauce)  o Whole grain breads, pasta, etc (whole wheat)  o Bran cereals    Bulking Agents -- This type of water-retaining fiber generally is easily obtained each day by one of the following:  o Psyllium bran -- The psyllium plant is remarkable because its ground seeds can retain so much water. This product is available as  Metamucil, Konsyl, Effersyllium, Per Diem Fiber, or the less expensive generic preparation in drug and health food stores. Although labeled a laxative, it really is not a laxative.  o Methylcellulose -- This is another fiber derived from wood which also retains water. It is available as Citrucel. o Polyethylene Glycol - and "artificial" fiber commonly called Miralax or Glycolax.  It is helpful for people with gassy or bloated feelings with regular fiber o Flax Seed - a less gassy fiber than psyllium   No reading or other relaxing activity while on the toilet. If bowel movements take longer than 5 minutes, you are too constipated   AVOID CONSTIPATION.  High fiber and water intake usually takes care of this.  Sometimes a laxative is needed to stimulate more frequent bowel movements, but    Laxatives are not a good long-term solution as it can wear the colon out. o Osmotics (Milk of Magnesia, Fleets phosphosoda, Magnesium citrate, MiraLax, GoLytely) are safer than  o Stimulants (Senokot, Castor Oil, Dulcolax, Ex Lax)    o Do not take laxatives for more than 7days in a row.    IF SEVERELY CONSTIPATED, try a Bowel Retraining Program: o Do not use laxatives.  o Eat a diet high in roughage, such as bran cereals and leafy vegetables.  o Drink six (6) ounces of prune or apricot juice each morning.  o Eat two (2) large servings of stewed fruit each day.  o Take one (1) heaping tablespoon of a psyllium-based bulking agent twice a day. Use sugar-free sweetener when possible to avoid excessive calories.  o Eat a normal breakfast.  o Set aside 15 minutes after breakfast to sit on the toilet, but do not strain to have a bowel movement.  o If you do not have a bowel movement by the third day, use an enema and repeat the above steps.    Controlling diarrhea o Switch to liquids and simpler foods for a few days to avoid stressing your intestines further. o Avoid dairy products (especially milk & ice cream) for a  short time.  The intestines often can lose the ability to digest lactose when stressed. o Avoid foods that cause gassiness or bloating.  Typical foods include beans and other legumes, cabbage, broccoli, and dairy foods.  Every person has some sensitivity to other foods, so listen to our body and avoid those foods that trigger problems for you. o Adding fiber (Citrucel, Metamucil, psyllium, Miralax) gradually can help thicken stools by absorbing excess fluid and retrain the intestines to act more normally.  Slowly increase the dose over a few weeks.  Too much fiber too soon can backfire and cause cramping & bloating. o Probiotics (such as active yogurt, Align, etc) may help repopulate the intestines and colon with normal bacteria and calm down a sensitive digestive tract.  Most studies show it to be of mild help, though, and such products can be costly. o Medicines:   Bismuth subsalicylate (ex. Kayopectate, Pepto Bismol) every 30 minutes for up to 6 doses can help control diarrhea.  Avoid if pregnant.   Loperamide (Immodium) can slow down diarrhea.  Start with two tablets (4mg  total) first and then try one tablet every 6 hours.  Avoid if you are having fevers or severe pain.  If you are not better or start feeling worse, stop all medicines and call your doctor for advice o Call your doctor if you are getting worse or not better.  Sometimes further testing (cultures, endoscopy, X-ray studies, bloodwork, etc) may be needed to help diagnose and treat the cause of the diarrhea.  Diverticulosis Diverticulosis is a common condition that develops when small pouches (diverticula) form in the wall of the colon. The risk of diverticulosis increases with age. It happens more often in people who eat a low-fiber diet. Most individuals with diverticulosis have no symptoms. Those individuals with symptoms usually experience abdominal pain, constipation, or loose stools (diarrhea). HOME CARE INSTRUCTIONS   Increase the  amount of fiber in your diet as directed by your caregiver or dietician. This may reduce symptoms of diverticulosis.  Your caregiver may recommend taking a dietary fiber supplement.  Drink at least 6 to 8 glasses of water each day to prevent constipation.  Try not to strain when you have a bowel movement.  Your caregiver may recommend avoiding nuts and seeds to prevent complications, although this is still an uncertain benefit.  Only take over-the-counter or prescription medicines for pain, discomfort, or fever as directed by your caregiver. FOODS WITH HIGH FIBER CONTENT INCLUDE:  Fruits. Apple, peach, pear, tangerine, raisins, prunes.  Vegetables. Brussels sprouts, asparagus, broccoli, cabbage, carrot, cauliflower, romaine lettuce, spinach, summer squash, tomato, winter squash, zucchini.  Starchy Vegetables. Baked beans, kidney beans, lima beans, split peas, lentils, potatoes (with skin).  Grains. Whole wheat bread, brown rice, bran flake cereal, plain oatmeal, white rice, shredded wheat, bran muffins. SEEK IMMEDIATE MEDICAL CARE IF:   You develop increasing pain or severe bloating.  You have an oral temperature above 102 F (38.9 C), not controlled by medicine.  You develop vomiting or bowel movements that are bloody or black. Document Released: 03/04/2004 Document Revised: 08/30/2011 Document Reviewed: 11/05/2009 [facility] Patient Information 2014 [place].

## 2013-09-20 ENCOUNTER — Telehealth (INDEPENDENT_AMBULATORY_CARE_PROVIDER_SITE_OTHER): Payer: Self-pay | Admitting: Surgery

## 2013-09-20 NOTE — Telephone Encounter (Signed)
SURGERY 11/15/13 needs pacemaker rep to assist in adjusting pacemaker reminder per orders

## 2013-09-24 ENCOUNTER — Ambulatory Visit (INDEPENDENT_AMBULATORY_CARE_PROVIDER_SITE_OTHER): Payer: 59 | Admitting: *Deleted

## 2013-09-24 DIAGNOSIS — I442 Atrioventricular block, complete: Secondary | ICD-10-CM

## 2013-09-26 ENCOUNTER — Encounter: Payer: Self-pay | Admitting: *Deleted

## 2013-09-26 LAB — MDC_IDC_ENUM_SESS_TYPE_REMOTE
Brady Statistic AP VP Percent: 1 %
Brady Statistic AP VS Percent: 1 %
Brady Statistic AS VP Percent: 99 %
Brady Statistic AS VS Percent: 1 %
Brady Statistic RV Percent Paced: 99 %
Date Time Interrogation Session: 20150406070639
Implantable Pulse Generator Model: 2210
Implantable Pulse Generator Serial Number: 7252855
Lead Channel Impedance Value: 560 Ohm
Lead Channel Pacing Threshold Amplitude: 1.5 V
Lead Channel Pacing Threshold Pulse Width: 0.5 ms
Lead Channel Sensing Intrinsic Amplitude: 9.3 mV
Lead Channel Setting Sensing Sensitivity: 4 mV
MDC IDC MSMT BATTERY REMAINING LONGEVITY: 88 mo
MDC IDC MSMT BATTERY VOLTAGE: 2.96 V
MDC IDC MSMT LEADCHNL RA IMPEDANCE VALUE: 490 Ohm
MDC IDC MSMT LEADCHNL RA SENSING INTR AMPL: 3.8 mV
MDC IDC MSMT LEADCHNL RV PACING THRESHOLD AMPLITUDE: 1.5 V
MDC IDC MSMT LEADCHNL RV PACING THRESHOLD PULSEWIDTH: 0.5 ms
MDC IDC SET LEADCHNL RA PACING AMPLITUDE: 3 V
MDC IDC SET LEADCHNL RV PACING AMPLITUDE: 1.75 V
MDC IDC SET LEADCHNL RV PACING PULSEWIDTH: 0.5 ms
MDC IDC STAT BRADY RA PERCENT PACED: 1 %

## 2013-09-28 ENCOUNTER — Encounter: Payer: Self-pay | Admitting: *Deleted

## 2013-10-01 ENCOUNTER — Telehealth: Payer: Self-pay | Admitting: Internal Medicine

## 2013-10-01 NOTE — Telephone Encounter (Signed)
Left message for patient to call back. We always write for substituion permitted so she is welcome to get Nexium generic if she would like. She just needs to call her pharmacy to see what is available. I have actually spoken to pharmacist at [facility] and am told that generally the generic Nexium and name brand dont have much cost difference at this point.

## 2013-10-02 NOTE — Telephone Encounter (Signed)
I have spoken to patient and have advised of the information below. She will contact [facility] and ask that they provide her with generic Nexium since we send as substitution permitted.

## 2013-10-03 ENCOUNTER — Encounter: Payer: Self-pay | Admitting: Cardiology

## 2013-10-03 ENCOUNTER — Ambulatory Visit (INDEPENDENT_AMBULATORY_CARE_PROVIDER_SITE_OTHER): Payer: 59 | Admitting: Cardiology

## 2013-10-03 VITALS — BP 118/61 | HR 78 | Ht 61.0 in | Wt 142.0 lb

## 2013-10-03 DIAGNOSIS — E785 Hyperlipidemia, unspecified: Secondary | ICD-10-CM

## 2013-10-03 DIAGNOSIS — Z01818 Encounter for other preprocedural examination: Secondary | ICD-10-CM

## 2013-10-03 DIAGNOSIS — I059 Rheumatic mitral valve disease, unspecified: Secondary | ICD-10-CM

## 2013-10-03 DIAGNOSIS — I10 Essential (primary) hypertension: Secondary | ICD-10-CM

## 2013-10-03 NOTE — Patient Instructions (Signed)
Your physician recommends that you have a lipid profile today.   Your physician wants you to follow-up in: 1 year with Dr Aundra Dubin. (April 2016). You will receive a reminder letter in the mail two months in advance. If you don't receive a letter, please call our office to schedule the follow-up appointment.   I will ask the Device [facility] to let the [facility] Jude device representative to know about your surgery scheduled for May 28,2015 at [facility] by Dr Johney Maine.

## 2013-10-04 LAB — LIPID PANEL
CHOL/HDL RATIO: 3
Cholesterol: 133 mg/dL (ref 0–200)
HDL: 41.4 mg/dL (ref >39.00)
LDL Cholesterol: 73 mg/dL (ref 0–99)
Triglycerides: 91 mg/dL (ref 0.0–149.0)
VLDL: 18.2 mg/dL (ref 0.0–40.0)

## 2013-10-04 NOTE — Progress Notes (Signed)
Quick Note:  Preliminary report reviewed by triage nurse and sent to MD desk. ______ 

## 2013-10-05 DIAGNOSIS — Z01818 Encounter for other preprocedural examination: Secondary | ICD-10-CM | POA: Insufficient documentation

## 2013-10-05 NOTE — Progress Notes (Signed)
Patient ID: Carmen Green, female   DOB: 06-25-1950, 63 y.o.   MRN: [id number] PCP: Dr. 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  Last echo in 4/14 showed EF 55-60% with MV prolapse and mild MR.  No chest pain. She has mild chronic dyspnea when climbing a steep flight of steps. She is able to walk on flat ground without problems. No orthopnea or PND.  Main problem this year has been 2 episodes of diverticulitis, once with abscess formation.  She has seen a Psychologist, sport and exercise and partial colectomy is planned.   ECG: NSR, v-paced  Allergies:  1) ! Keflex  2) ! Pcn   Past Medical History:  1. 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  2. 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  Echo (4/14) with EF 55-60%, mild LVH, MV prolapse with mild MR, septal paradox with RV pacing.  3. HYPERLIPIDEMIA (ICD-272.4)  4. HYPERTENSION (ICD-401.9)  5. ANXIETY (ICD-300.00)  6. HIATAL HERNIA (ICD-553.3)  7. GERD (ICD-530.81)  8. COLONIC POLYPS, HX OF (ICD-V12.72)  9. Hx of ESOPHAGITIS (ICD-530.10)  10. LHC (5/09): no coronary disease  11. Diverticulitis: Multiple episodes, 2 in 2015, 1 in 2012.   Family History:  Family History of Diabetes: Mother, Grandmother  Family History of Heart Disease: Maternal Grandmother, Maternal Aunt  Mother with CVA  Social History:  Illicit Drug Use - no  Patient has never smoked.  Alcohol Use - no  Patient does not get regular exercise.  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 buPROPion (WELLBUTRIN XL) 300 MG 24 hr tablet Take 300 mg by mouth daily.        . busPIRone (BUSPAR) 10 MG tablet Take 10 mg by mouth 3 (three) times daily.        . cetirizine  (ZYRTEC) 10 MG tablet Take 10 mg by mouth. Daily as needed       . esomeprazole (NEXIUM) 40 MG capsule Take 40 mg by mouth daily.      Marland Kitchen estrogen, conjugated,-medroxyprogesterone (PREMPRO) 0.45-1.5 MG per tablet Take 1 tablet by mouth daily.        . fluticasone (FLONASE) 50 MCG/ACT nasal spray Place 1 spray into both nostrils daily.      Marland Kitchen guaiFENesin (MUCINEX) 600 MG 12 hr tablet Take 1,200 mg by mouth. 1 tab once daily as needed       . lisinopril-hydrochlorothiazide (PRINZIDE,ZESTORETIC) 20-12.5 MG per tablet Take 1 tablet by mouth daily.        Marland Kitchen LORazepam (ATIVAN) 0.5 MG tablet Take 0.5 mg by mouth at bedtime.        . Multiple Vitamin (MULTIVITAMIN) tablet Take 1 tablet by mouth daily.        . ondansetron (ZOFRAN ODT) 8 MG disintegrating tablet Take 1 tablet (8 mg total) by mouth every 8 (eight) hours as needed for nausea or vomiting.  25 tablet  0  . rosuvastatin (CRESTOR) 10 MG tablet Take 10 mg by mouth daily.         No current facility-administered medications for this visit.    BP 118/61  Pulse 78  Ht 5\' 1"  (1.549 m)  Wt 64.411 kg (142 lb)  BMI 26.84 kg/m2 General:  NAD Neck: No JVD, no thyromegaly or thyroid nodule.  Lungs: Clear to auscultation bilaterally with normal respiratory effort. CV: Nondisplaced PMI.  Heart regular S1/S2, no X7/D5, mid systolic click with 1/6 HSM at apex.  No peripheral edema.  No carotid bruit.  Normal pedal pulses.  Abdomen: Soft, nontender, no hepatosplenomegaly, no distention.  Neurologic: Alert and oriented x 3.  Psych: Normal affect. Extremities: No clubbing or cyanosis.   Assessment/Plan: 1. St Jude PCM: Gets regular PCM followup.  2. Mitral regurgitation: Mild MR due to MV prolapse on 4/14 echo. 3. HTN: BP seems well-controlled at this time.  4. Preoperative evaluation: Patient is planned for partial colectomy for recurrent diverticulitis and abscess formation.  She is symptomatically stable.  I think she can proceed with surgery  without further workup.  Will make sure [employer] Jude representative is available on the day of surgery to manage pacemaker.   5. Hyperlipidemia: Check lipids today.   Larey Dresser 10/05/2013

## 2013-10-08 ENCOUNTER — Encounter: Payer: Self-pay | Admitting: Internal Medicine

## 2013-10-08 ENCOUNTER — Telehealth (INDEPENDENT_AMBULATORY_CARE_PROVIDER_SITE_OTHER): Payer: Self-pay

## 2013-10-08 NOTE — Telephone Encounter (Signed)
Called pt to notify her that we did received the cardiac clearance from Dr Aundra Dubin also with instructions that Dr Aundra Dubin would contact [facility] pacemaker rep to be present on the day of surgery for 11/15/13. I did contact our surgery scheduler Katie to see if she needed to notify someone at the hospital but she said no the hospital would take care of everything. The pt understands.

## 2013-11-06 ENCOUNTER — Encounter (HOSPITAL_COMMUNITY): Payer: Self-pay | Admitting: Pharmacy Technician

## 2013-11-09 ENCOUNTER — Encounter (HOSPITAL_COMMUNITY): Payer: Self-pay

## 2013-11-09 ENCOUNTER — Encounter (HOSPITAL_COMMUNITY)
Admission: RE | Admit: 2013-11-09 | Discharge: 2013-11-09 | Disposition: A | Discharge status: Home / Self Care | Payer: 59 | Source: Ambulatory Visit | Attending: Surgery | Admitting: Surgery

## 2013-11-09 ENCOUNTER — Ambulatory Visit (HOSPITAL_COMMUNITY)
Admission: RE | Admit: 2013-11-09 | Discharge: 2013-11-09 | Disposition: A | Discharge status: Home / Self Care | Payer: 59 | Source: Ambulatory Visit | Attending: Anesthesiology | Admitting: Anesthesiology

## 2013-11-09 DIAGNOSIS — Z01812 Encounter for preprocedural laboratory examination: Secondary | ICD-10-CM | POA: Insufficient documentation

## 2013-11-09 DIAGNOSIS — Z01818 Encounter for other preprocedural examination: Secondary | ICD-10-CM | POA: Insufficient documentation

## 2013-11-09 HISTORY — DX: Unspecified osteoarthritis, unspecified site: M19.90

## 2013-11-09 HISTORY — DX: Cardiac murmur, unspecified: R01.1

## 2013-11-09 HISTORY — DX: Allergy status to unspecified drugs, medicaments and biological substances: Z88.9

## 2013-11-09 LAB — CBC
HCT: 36.3 % (ref 36.0–46.0)
HEMOGLOBIN: 12.5 g/dL (ref 12.0–15.0)
MCH: 32.2 pg (ref 26.0–34.0)
MCHC: 34.4 g/dL (ref 30.0–36.0)
MCV: 93.6 fL (ref 78.0–100.0)
Platelets: 258 10*3/uL (ref 150–400)
RBC: 3.88 MIL/uL (ref 3.87–5.11)
RDW: 12.4 % (ref 11.5–15.5)
WBC: 6.8 10*3/uL (ref 4.0–10.5)

## 2013-11-09 LAB — ABO/RH: ABO/RH(D): O POS

## 2013-11-09 LAB — BASIC METABOLIC PANEL
BUN: 12 mg/dL (ref 6–23)
CHLORIDE: 99 meq/L (ref 96–112)
CO2: 24 meq/L (ref 19–32)
Calcium: 10 mg/dL (ref 8.4–10.5)
Creatinine, Ser: 0.98 mg/dL (ref 0.50–1.10)
GFR calc Af Amer: 69 mL/min — ABNORMAL LOW (ref >90)
GFR, EST NON AFRICAN AMERICAN: 59 mL/min — AB (ref >90)
GLUCOSE: 79 mg/dL (ref 70–99)
POTASSIUM: 4 meq/L (ref 3.7–5.3)
SODIUM: 136 meq/L — AB (ref 137–147)

## 2013-11-09 NOTE — Progress Notes (Signed)
EKG with LOV Carmen Green 10/03/13 epic,  LOV Carmen Green 10/14 with last pacer interrogation 10/03/13 ALL IN EPIC.  Per Carmen Green office note, [employer] Jude rep needs to be here day of surgery to manage leads.  Per surgeon note the same.  Pacemaker [place] faxed to Carmen Green and returned stating no reprograming is needed.  Spoke with Carmen Green, rep at [facility] (pager [phone]) who confirmed with Carmen Green office that device only needs magnet placement.  Per Carmen Green, I am to speak with Carmen Green (who will be here day of surgery) on 11/13/13  To confirm/resolve conflict in pts car  What does anesthesia want [place] to do?   Pt states she has been told a rep will be here.

## 2013-11-09 NOTE — Patient Instructions (Addendum)
Your procedure is scheduled on:  5/ 28 15  THURSDAY  Report to [facility] at  [place].  Call this number if you have problems the morning of surgery: [phone]     BOWEL PREP AS PER OFFICE   Do not take ANYTHING BY MOUTH :After Midnight.WEDNESDAY NIGHT   Take these medicines the morning of surgery with A SIP OF WATER:WELLBUTRIN, Osborne Casco, PREMPRO May use flonase if needed   .  Contacts, dentures or partial plates, or metal hairpins  can not be worn to surgery. Your family will be responsible for glasses, dentures, hearing aides while you are in surgery  Leave suitcase in the car. After surgery it may be brought to your room.  For patients admitted to the hospital, checkout time is 11:00 AM day of  discharge.                DO NOT WEAR JEWELRY, LOTIONS, POWDERS, OR PERFUMES.  WOMEN-- DO NOT SHAVE LEGS OR UNDERARMS FOR 48 HOURS BEFORE SHOWERS. MEN MAY SHAVE FACE.                                                                                                                           - Preparing for Surgery Before surgery, you can play an important role.  Because skin is not sterile, your skin needs to be as free of germs as possible.  You can reduce the number of germs on your skin by washing with CHG (chlorahexidine gluconate) soap before surgery.  CHG is an antiseptic cleaner which kills germs and bonds with the skin to continue killing germs even after washing. Please DO NOT use if you have an allergy to CHG or antibacterial soaps.  If your skin becomes reddened/irritated stop using the CHG and inform your nurse when you arrive at Short Stay. Do not shave (including legs and underarms) for at least 48 hours prior to the first CHG shower.  You may shave your face/neck. Please follow these instructions carefully:  1.  Shower with CHG Soap the night before surgery and the  morning of Surgery.  2.  If you choose to wash your hair, wash your hair  first as usual with your  normal  shampoo.  3.  After you shampoo, rinse your hair and body thoroughly to remove the  shampoo.                           4.  Use CHG as you would any other liquid soap.  You can apply chg directly  to the skin and wash                       Gently with a scrungie or clean washcloth.  5.  Apply the CHG Soap to your body ONLY FROM THE NECK DOWN.   Do  not use on face/ open                           Wound or open sores. Avoid contact with eyes, ears mouth and genitals (private parts).                       Wash face,  Genitals (private parts) with your normal soap.             6.  Wash thoroughly, paying special attention to the area where your surgery  will be performed.  7.  Thoroughly rinse your body with warm water from the neck down.  8.  DO NOT shower/wash with your normal soap after using and rinsing off  the CHG Soap.                9.  Pat yourself dry with a clean towel.            10.  Wear clean pajamas.            11.  Place clean sheets on your bed the night of your first shower and do not  sleep with pets. Day of Surgery : Do not apply any lotions/deodorants the morning of surgery.  Please wear clean clothes to the hospital/surgery center.  FAILURE TO FOLLOW THESE INSTRUCTIONS MAY RESULT IN THE CANCELLATION OF YOUR SURGERY PATIENT SIGNATURE_________________________________  NURSE SIGNATURE__________________________________  ________________________________________________________________________  WHAT IS A BLOOD TRANSFUSION? Blood Transfusion Information  A transfusion is the replacement of blood or some of its parts. Blood is made up of multiple cells which provide different functions.  Red blood cells carry oxygen and are used for blood loss replacement.  White blood cells fight against infection.  Platelets control bleeding.  Plasma helps clot blood.  Other blood products are available for specialized needs, such as hemophilia or other  clotting disorders. BEFORE THE TRANSFUSION  Who gives blood for transfusions?   Healthy volunteers who are fully evaluated to make sure their blood is safe. This is blood bank blood. Transfusion therapy is the safest it has ever been in the practice of medicine. Before blood is taken from a donor, a complete history is taken to make sure that person has no history of diseases nor engages in risky social behavior (examples are intravenous drug use or sexual activity with multiple partners). The donor's travel history is screened to minimize risk of transmitting infections, such as malaria. The donated blood is tested for signs of infectious diseases, such as HIV and hepatitis. The blood is then tested to be sure it is compatible with you in order to minimize the chance of a transfusion reaction. If you or a relative donates blood, this is often done in anticipation of surgery and is not appropriate for emergency situations. It takes many days to process the donated blood. RISKS AND COMPLICATIONS Although transfusion therapy is very safe and saves many lives, the main dangers of transfusion include:   Getting an infectious disease.  Developing a transfusion reaction. This is an allergic reaction to something in the blood you were given. Every precaution is taken to prevent this. The decision to have a blood transfusion has been considered carefully by your caregiver before blood is given. Blood is not given unless the benefits outweigh the risks. AFTER THE TRANSFUSION  Right after receiving a blood transfusion, you will usually feel much better and more energetic. This is especially true  if your red blood cells have gotten low (anemic). The transfusion raises the level of the red blood cells which carry oxygen, and this usually causes an energy increase.  The nurse administering the transfusion will monitor you carefully for complications. HOME CARE INSTRUCTIONS  No special instructions are needed  after a transfusion. You may find your energy is better. Speak with your caregiver about any limitations on activity for underlying diseases you may have. SEEK MEDICAL CARE IF:   Your condition is not improving after your transfusion.  You develop redness or irritation at the intravenous (IV) site. SEEK IMMEDIATE MEDICAL CARE IF:  Any of the following symptoms occur over the next 12 hours:  Shaking chills.  You have a temperature by mouth above 102 F (38.9 C), not controlled by medicine.  Chest, back, or muscle pain.  People around you feel you are not acting correctly or are confused.  Shortness of breath or difficulty breathing.  Dizziness and fainting.  You get a rash or develop hives.  You have a decrease in urine output.  Your urine turns a dark color or changes to pink, red, or brown. Any of the following symptoms occur over the next 10 days:  You have a temperature by mouth above 102 F (38.9 C), not controlled by medicine.  Shortness of breath.  Weakness after normal activity.  The white part of the eye turns yellow (jaundice).  You have a decrease in the amount of urine or are urinating less often.  Your urine turns a dark color or changes to pink, red, or brown. Document Released: 06/04/2000 Document Revised: 08/30/2011 Document Reviewed: 01/22/2008 ExitCare Patient Information 2014 [place].  _______________________________________________________________________   CLEAR LIQUID DIET   Foods Allowed                                                                     Foods Excluded  Coffee and tea, regular and decaf                             liquids that you cannot  Plain Jell-O in any flavor                                             see through such as: Fruit ices (not with fruit pulp)                                     milk, soups, orange juice  Iced Popsicles                                    All solid food Carbonated beverages,  regular and diet                                    Cranberry, grape and apple juices Sports drinks like Gatorade Lightly seasoned  clear broth or consume(fat free) Sugar, honey syrup  Sample Menu Breakfast                                Lunch                                     Supper Cranberry juice                    Beef broth                            Chicken broth Jell-O                                     Grape juice                           Apple juice Coffee or tea                        Jell-O                                      Popsicle                                                Coffee or tea                        Coffee or tea  _____________________________________________________________________       Your procedure is scheduled on:    Report to [place] at       AM.   Call this number if you have problems the morning of surgery: [phone]        Do not eat food  Or drink :After Midnight.   Take these medicines the morning of surgery with A SIP OF WATER:   .  Contacts, dentures or partial plates, or metal hairpins  can not be worn to surgery. Your family will be responsible for glasses, dentures, hearing aides while you are in surgery  Leave suitcase in the car. After surgery it may be brought to your room.  For patients admitted to the hospital, checkout time is 11:00 AM day of  discharge.         [facility] IS NOT RESPONSIBLE FOR ANY VALUABLES  Patients discharged the day of surgery will not be allowed to drive home. IF going home the day of surgery, you must have a driver and someone to stay with you for the first 24 hours  Name and phone number of your driver:

## 2013-11-13 NOTE — Progress Notes (Signed)
Per Dr Christian Mate-  Orders will be followed as per Dr Rayann Heman for pacer care intraop.  Notified [facility] Guard. [facility] Rep as well

## 2013-11-14 MED ORDER — BUPIVACAINE 0.25 % ON-Q PUMP DUAL CATH 300 ML
300.0000 mL | INJECTION | Status: DC
  Filled 2013-11-14: qty 300

## 2013-11-14 MED ORDER — GENTAMICIN SULFATE 40 MG/ML IJ SOLN
320.0000 mg | INTRAVENOUS | Status: AC
  Administered 2013-11-15: 320 mg via INTRAVENOUS
  Filled 2013-11-14 (×2): qty 8

## 2013-11-15 ENCOUNTER — Encounter (HOSPITAL_COMMUNITY)
Admission: RE | Disposition: A | Discharge status: Home / Self Care | Payer: Self-pay | Source: Ambulatory Visit | Attending: Surgery

## 2013-11-15 ENCOUNTER — Inpatient Hospital Stay (HOSPITAL_COMMUNITY): Payer: 59 | Admitting: Certified Registered Nurse Anesthetist

## 2013-11-15 ENCOUNTER — Encounter (HOSPITAL_COMMUNITY): Payer: 59 | Admitting: Certified Registered Nurse Anesthetist

## 2013-11-15 ENCOUNTER — Inpatient Hospital Stay (HOSPITAL_COMMUNITY)
Admission: RE | Admit: 2013-11-15 | Discharge: 2013-11-19 | DRG: 333 | Disposition: A | Discharge status: Home / Self Care | Payer: 59 | Source: Ambulatory Visit | Attending: Surgery | Admitting: Surgery

## 2013-11-15 ENCOUNTER — Encounter (HOSPITAL_COMMUNITY): Payer: Self-pay | Admitting: *Deleted

## 2013-11-15 DIAGNOSIS — Z833 Family history of diabetes mellitus: Secondary | ICD-10-CM

## 2013-11-15 DIAGNOSIS — I059 Rheumatic mitral valve disease, unspecified: Secondary | ICD-10-CM | POA: Diagnosis present

## 2013-11-15 DIAGNOSIS — Z950 Presence of cardiac pacemaker: Secondary | ICD-10-CM

## 2013-11-15 DIAGNOSIS — K449 Diaphragmatic hernia without obstruction or gangrene: Secondary | ICD-10-CM | POA: Diagnosis present

## 2013-11-15 DIAGNOSIS — F411 Generalized anxiety disorder: Secondary | ICD-10-CM | POA: Diagnosis present

## 2013-11-15 DIAGNOSIS — N7093 Salpingitis and oophoritis, unspecified: Secondary | ICD-10-CM | POA: Diagnosis present

## 2013-11-15 DIAGNOSIS — K219 Gastro-esophageal reflux disease without esophagitis: Secondary | ICD-10-CM | POA: Diagnosis present

## 2013-11-15 DIAGNOSIS — Z860100 Personal history of colon polyps, unspecified: Secondary | ICD-10-CM

## 2013-11-15 DIAGNOSIS — I10 Essential (primary) hypertension: Secondary | ICD-10-CM | POA: Diagnosis present

## 2013-11-15 DIAGNOSIS — Z8249 Family history of ischemic heart disease and other diseases of the circulatory system: Secondary | ICD-10-CM | POA: Diagnosis not present

## 2013-11-15 DIAGNOSIS — I429 Cardiomyopathy, unspecified: Secondary | ICD-10-CM | POA: Diagnosis present

## 2013-11-15 DIAGNOSIS — K5732 Diverticulitis of large intestine without perforation or abscess without bleeding: Principal | ICD-10-CM

## 2013-11-15 DIAGNOSIS — Z9849 Cataract extraction status, unspecified eye: Secondary | ICD-10-CM

## 2013-11-15 DIAGNOSIS — Z9089 Acquired absence of other organs: Secondary | ICD-10-CM

## 2013-11-15 DIAGNOSIS — Z823 Family history of stroke: Secondary | ICD-10-CM | POA: Diagnosis not present

## 2013-11-15 DIAGNOSIS — Z801 Family history of malignant neoplasm of trachea, bronchus and lung: Secondary | ICD-10-CM | POA: Diagnosis not present

## 2013-11-15 DIAGNOSIS — F410 Panic disorder [episodic paroxysmal anxiety] without agoraphobia: Secondary | ICD-10-CM | POA: Diagnosis present

## 2013-11-15 DIAGNOSIS — N736 Female pelvic peritoneal adhesions (postinfective): Secondary | ICD-10-CM | POA: Diagnosis present

## 2013-11-15 DIAGNOSIS — E785 Hyperlipidemia, unspecified: Secondary | ICD-10-CM | POA: Diagnosis present

## 2013-11-15 DIAGNOSIS — N7013 Chronic salpingitis and oophoritis: Secondary | ICD-10-CM | POA: Diagnosis present

## 2013-11-15 DIAGNOSIS — K660 Peritoneal adhesions (postprocedural) (postinfection): Secondary | ICD-10-CM

## 2013-11-15 DIAGNOSIS — K5720 Diverticulitis of large intestine with perforation and abscess without bleeding: Secondary | ICD-10-CM

## 2013-11-15 DIAGNOSIS — I428 Other cardiomyopathies: Secondary | ICD-10-CM | POA: Diagnosis present

## 2013-11-15 HISTORY — DX: Esophagitis, unspecified: K20.9

## 2013-11-15 HISTORY — DX: Contusion of unspecified front wall of thorax, initial encounter: S20.219A

## 2013-11-15 HISTORY — PX: PROCTOSCOPY: SHX2266

## 2013-11-15 HISTORY — PX: LAPAROSCOPIC LYSIS OF ADHESIONS: SHX5905

## 2013-11-15 LAB — TYPE AND SCREEN
ABO/RH(D): O POS
ANTIBODY SCREEN: NEGATIVE

## 2013-11-15 SURGERY — ROBOT ASSISTED LAPAROSCOPIC PARTIAL COLECTOMY
Anesthesia: General | Site: Abdomen

## 2013-11-15 MED ORDER — ONDANSETRON HCL 4 MG/2ML IJ SOLN
INTRAMUSCULAR | Status: DC | PRN
  Administered 2013-11-15: 4 mg via INTRAVENOUS

## 2013-11-15 MED ORDER — HYDROMORPHONE HCL PF 1 MG/ML IJ SOLN: 0.2500 mg | INTRAMUSCULAR | Status: DC | PRN

## 2013-11-15 MED ORDER — LIDOCAINE HCL (CARDIAC) 20 MG/ML IV SOLN
INTRAVENOUS | Status: AC
  Filled 2013-11-15: qty 5

## 2013-11-15 MED ORDER — PROPOFOL 10 MG/ML IV BOLUS
INTRAVENOUS | Status: AC
  Filled 2013-11-15: qty 20

## 2013-11-15 MED ORDER — LORATADINE 10 MG PO TABS
10.0000 mg | ORAL_TABLET | Freq: Every day | ORAL | Status: DC
  Filled 2013-11-15 (×5): qty 1

## 2013-11-15 MED ORDER — SODIUM CHLORIDE 0.9 % IJ SOLN
INTRAMUSCULAR | Status: AC
  Filled 2013-11-15: qty 10

## 2013-11-15 MED ORDER — PROMETHAZINE HCL 25 MG/ML IJ SOLN
6.2500 mg | Freq: Four times a day (QID) | INTRAMUSCULAR | Status: DC | PRN
  Administered 2013-11-15: 6.25 mg via INTRAVENOUS
  Filled 2013-11-15 (×2): qty 1

## 2013-11-15 MED ORDER — CLINDAMYCIN PHOSPHATE 900 MG/50ML IV SOLN
INTRAVENOUS | Status: AC
  Filled 2013-11-15: qty 50

## 2013-11-15 MED ORDER — BUPIVACAINE-EPINEPHRINE 0.25% -1:200000 IJ SOLN
INTRAMUSCULAR | Status: DC | PRN
  Administered 2013-11-15: 55 mL

## 2013-11-15 MED ORDER — LACTATED RINGERS IV SOLN: INTRAVENOUS | Status: DC

## 2013-11-15 MED ORDER — PANTOPRAZOLE SODIUM 40 MG PO TBEC
40.0000 mg | DELAYED_RELEASE_TABLET | Freq: Every day | ORAL | Status: DC
  Administered 2013-11-16 – 2013-11-18 (×3): 40 mg via ORAL
  Filled 2013-11-15 (×5): qty 1

## 2013-11-15 MED ORDER — ATROPINE SULFATE 0.4 MG/ML IJ SOLN
INTRAMUSCULAR | Status: AC
  Filled 2013-11-15: qty 1

## 2013-11-15 MED ORDER — OXYCODONE HCL 5 MG PO TABS: 5.0000 mg | ORAL_TABLET | ORAL | Status: DC | PRN

## 2013-11-15 MED ORDER — HEPARIN SODIUM (PORCINE) 5000 UNIT/ML IJ SOLN
5000.0000 [IU] | Freq: Three times a day (TID) | INTRAMUSCULAR | Status: DC
  Administered 2013-11-16 – 2013-11-19 (×10): 5000 [IU] via SUBCUTANEOUS
  Filled 2013-11-15 (×13): qty 1

## 2013-11-15 MED ORDER — SACCHAROMYCES BOULARDII 250 MG PO CAPS
250.0000 mg | ORAL_CAPSULE | Freq: Two times a day (BID) | ORAL | Status: DC
  Administered 2013-11-15 – 2013-11-18 (×8): 250 mg via ORAL
  Filled 2013-11-15 (×10): qty 1

## 2013-11-15 MED ORDER — NEOSTIGMINE METHYLSULFATE 10 MG/10ML IV SOLN
INTRAVENOUS | Status: DC | PRN
  Administered 2013-11-15: 35 mg via INTRAVENOUS

## 2013-11-15 MED ORDER — LACTATED RINGERS IV SOLN
INTRAVENOUS | Status: DC | PRN
  Administered 2013-11-15 (×3): via INTRAVENOUS

## 2013-11-15 MED ORDER — GUAIFENESIN ER 600 MG PO TB12
1200.0000 mg | ORAL_TABLET | Freq: Two times a day (BID) | ORAL | Status: DC | PRN
  Filled 2013-11-15: qty 2

## 2013-11-15 MED ORDER — LORAZEPAM 0.5 MG PO TABS
0.5000 mg | ORAL_TABLET | Freq: Every day | ORAL | Status: DC
  Administered 2013-11-15 – 2013-11-18 (×4): 0.5 mg via ORAL
  Filled 2013-11-15 (×4): qty 1

## 2013-11-15 MED ORDER — METHYLENE BLUE 1 % INJ SOLN
INTRAMUSCULAR | Status: DC | PRN
  Administered 2013-11-15: 5 mL via INTRAVENOUS

## 2013-11-15 MED ORDER — IBUPROFEN 400 MG PO TABS: 400.0000 mg | ORAL_TABLET | Freq: Four times a day (QID) | ORAL | Status: DC | PRN

## 2013-11-15 MED ORDER — ACETAMINOPHEN 10 MG/ML IV SOLN
1000.0000 mg | Freq: Once | INTRAVENOUS | Status: AC
  Administered 2013-11-15: 1000 mg via INTRAVENOUS
  Filled 2013-11-15: qty 100

## 2013-11-15 MED ORDER — FENTANYL CITRATE 0.05 MG/ML IJ SOLN
INTRAMUSCULAR | Status: DC | PRN
  Administered 2013-11-15 (×5): 50 ug via INTRAVENOUS

## 2013-11-15 MED ORDER — PHENYLEPHRINE 40 MCG/ML (10ML) SYRINGE FOR IV PUSH (FOR BLOOD PRESSURE SUPPORT)
PREFILLED_SYRINGE | INTRAVENOUS | Status: AC
  Filled 2013-11-15: qty 10

## 2013-11-15 MED ORDER — GLYCOPYRROLATE 0.2 MG/ML IJ SOLN
INTRAMUSCULAR | Status: AC
  Filled 2013-11-15: qty 2

## 2013-11-15 MED ORDER — KCL IN DEXTROSE-NACL 40-5-0.9 MEQ/L-%-% IV SOLN
INTRAVENOUS | Status: DC
  Administered 2013-11-15 – 2013-11-16 (×2): via INTRAVENOUS
  Filled 2013-11-15 (×3): qty 1000

## 2013-11-15 MED ORDER — FENTANYL CITRATE 0.05 MG/ML IJ SOLN
INTRAMUSCULAR | Status: AC
  Filled 2013-11-15: qty 5

## 2013-11-15 MED ORDER — ONDANSETRON HCL 4 MG/2ML IJ SOLN
INTRAMUSCULAR | Status: AC
  Filled 2013-11-15: qty 2

## 2013-11-15 MED ORDER — ALVIMOPAN 12 MG PO CAPS
12.0000 mg | ORAL_CAPSULE | Freq: Once | ORAL | Status: AC
  Administered 2013-11-15: 12 mg via ORAL
  Filled 2013-11-15: qty 1

## 2013-11-15 MED ORDER — DIPHENHYDRAMINE HCL 50 MG/ML IJ SOLN: 12.5000 mg | Freq: Four times a day (QID) | INTRAMUSCULAR | Status: DC | PRN

## 2013-11-15 MED ORDER — SUCCINYLCHOLINE CHLORIDE 20 MG/ML IJ SOLN
INTRAMUSCULAR | Status: DC | PRN
  Administered 2013-11-15: 100 mg via INTRAVENOUS

## 2013-11-15 MED ORDER — ALVIMOPAN 12 MG PO CAPS
12.0000 mg | ORAL_CAPSULE | Freq: Two times a day (BID) | ORAL | Status: DC
  Administered 2013-11-16 – 2013-11-18 (×5): 12 mg via ORAL
  Filled 2013-11-15 (×6): qty 1

## 2013-11-15 MED ORDER — BUSPIRONE HCL 10 MG PO TABS
10.0000 mg | ORAL_TABLET | Freq: Three times a day (TID) | ORAL | Status: DC
  Administered 2013-11-15 – 2013-11-18 (×11): 10 mg via ORAL
  Filled 2013-11-15 (×15): qty 1

## 2013-11-15 MED ORDER — LACTATED RINGERS IV SOLN: Freq: Once | INTRAVENOUS | Status: DC

## 2013-11-15 MED ORDER — METOPROLOL TARTRATE 1 MG/ML IV SOLN
5.0000 mg | Freq: Four times a day (QID) | INTRAVENOUS | Status: DC | PRN
  Filled 2013-11-15: qty 5

## 2013-11-15 MED ORDER — NEOSTIGMINE METHYLSULFATE 10 MG/10ML IV SOLN
INTRAVENOUS | Status: AC
  Filled 2013-11-15: qty 1

## 2013-11-15 MED ORDER — DEXAMETHASONE SODIUM PHOSPHATE 10 MG/ML IJ SOLN
INTRAMUSCULAR | Status: DC | PRN
  Administered 2013-11-15: 10 mg via INTRAVENOUS

## 2013-11-15 MED ORDER — ACETAMINOPHEN 500 MG PO TABS
1000.0000 mg | ORAL_TABLET | Freq: Three times a day (TID) | ORAL | Status: DC
  Administered 2013-11-15 – 2013-11-17 (×6): 1000 mg via ORAL
  Filled 2013-11-15 (×9): qty 2

## 2013-11-15 MED ORDER — GLYCOPYRROLATE 0.2 MG/ML IJ SOLN
INTRAMUSCULAR | Status: DC | PRN
  Administered 2013-11-15: 0.4 mg via INTRAVENOUS

## 2013-11-15 MED ORDER — CHLORHEXIDINE GLUCONATE 0.12 % MT SOLN
15.0000 mL | Freq: Two times a day (BID) | OROMUCOSAL | Status: DC
  Administered 2013-11-15 – 2013-11-18 (×4): 15 mL via OROMUCOSAL
  Filled 2013-11-15 (×10): qty 15

## 2013-11-15 MED ORDER — PHENYLEPHRINE 40 MCG/ML (10ML) SYRINGE FOR IV PUSH (FOR BLOOD PRESSURE SUPPORT)
PREFILLED_SYRINGE | INTRAVENOUS | Status: AC
  Filled 2013-11-15: qty 20

## 2013-11-15 MED ORDER — MAGIC MOUTHWASH
15.0000 mL | Freq: Four times a day (QID) | ORAL | Status: DC | PRN
  Filled 2013-11-15: qty 15

## 2013-11-15 MED ORDER — HYDROMORPHONE HCL PF 2 MG/ML IJ SOLN
INTRAMUSCULAR | Status: AC
  Filled 2013-11-15: qty 1

## 2013-11-15 MED ORDER — LIDOCAINE HCL (CARDIAC) 20 MG/ML IV SOLN
INTRAVENOUS | Status: DC | PRN
  Administered 2013-11-15: 70 mg via INTRAVENOUS

## 2013-11-15 MED ORDER — 0.9 % SODIUM CHLORIDE (POUR BTL) OPTIME
TOPICAL | Status: DC | PRN
  Administered 2013-11-15: 2000 mL

## 2013-11-15 MED ORDER — EPHEDRINE SULFATE 50 MG/ML IJ SOLN
INTRAMUSCULAR | Status: AC
  Filled 2013-11-15: qty 1

## 2013-11-15 MED ORDER — KETOROLAC TROMETHAMINE 30 MG/ML IJ SOLN
INTRAMUSCULAR | Status: AC
  Filled 2013-11-15: qty 1

## 2013-11-15 MED ORDER — PHENYLEPHRINE HCL 10 MG/ML IJ SOLN
INTRAMUSCULAR | Status: DC | PRN
  Administered 2013-11-15 (×7): 80 ug via INTRAVENOUS

## 2013-11-15 MED ORDER — ALUM & MAG HYDROXIDE-SIMETH 200-200-20 MG/5ML PO SUSP: 30.0000 mL | Freq: Four times a day (QID) | ORAL | Status: DC | PRN

## 2013-11-15 MED ORDER — FLUTICASONE PROPIONATE 50 MCG/ACT NA SUSP
1.0000 | Freq: Every day | NASAL | Status: DC
  Filled 2013-11-15: qty 16

## 2013-11-15 MED ORDER — MEPERIDINE HCL 50 MG/ML IJ SOLN: 6.2500 mg | INTRAMUSCULAR | Status: DC | PRN

## 2013-11-15 MED ORDER — MIDAZOLAM HCL 2 MG/2ML IJ SOLN
INTRAMUSCULAR | Status: AC
  Filled 2013-11-15: qty 2

## 2013-11-15 MED ORDER — SODIUM CHLORIDE 0.9 % IV SOLN
INTRAVENOUS | Status: DC
  Filled 2013-11-15: qty 6

## 2013-11-15 MED ORDER — PROPOFOL 10 MG/ML IV BOLUS
INTRAVENOUS | Status: DC | PRN
  Administered 2013-11-15: 150 mg via INTRAVENOUS

## 2013-11-15 MED ORDER — MIDAZOLAM HCL 5 MG/5ML IJ SOLN
INTRAMUSCULAR | Status: DC | PRN
  Administered 2013-11-15: 2 mg via INTRAVENOUS

## 2013-11-15 MED ORDER — BIOTENE DRY MOUTH MT LIQD
15.0000 mL | Freq: Two times a day (BID) | OROMUCOSAL | Status: DC
  Administered 2013-11-15 – 2013-11-18 (×5): 15 mL via OROMUCOSAL

## 2013-11-15 MED ORDER — BUPIVACAINE ON-Q PAIN PUMP (FOR ORDER SET NO CHG)
INJECTION | Status: DC
  Filled 2013-11-15: qty 1

## 2013-11-15 MED ORDER — METHYLENE BLUE 1 % INJ SOLN
INTRAMUSCULAR | Status: AC
  Filled 2013-11-15: qty 10

## 2013-11-15 MED ORDER — CLINDAMYCIN PHOSPHATE 900 MG/50ML IV SOLN
900.0000 mg | INTRAVENOUS | Status: AC
  Administered 2013-11-15: 900 mg via INTRAVENOUS

## 2013-11-15 MED ORDER — LIP MEDEX EX OINT
1.0000 "application " | TOPICAL_OINTMENT | Freq: Two times a day (BID) | CUTANEOUS | Status: DC
  Administered 2013-11-15 – 2013-11-18 (×7): 1 via TOPICAL
  Filled 2013-11-15: qty 7

## 2013-11-15 MED ORDER — PROMETHAZINE HCL 25 MG/ML IJ SOLN: 6.2500 mg | INTRAMUSCULAR | Status: DC | PRN

## 2013-11-15 MED ORDER — HEPARIN SODIUM (PORCINE) 5000 UNIT/ML IJ SOLN
5000.0000 [IU] | Freq: Once | INTRAMUSCULAR | Status: AC
  Administered 2013-11-15: 5000 [IU] via SUBCUTANEOUS
  Filled 2013-11-15: qty 1

## 2013-11-15 MED ORDER — LACTATED RINGERS IR SOLN
Status: DC | PRN
  Administered 2013-11-15: 1000 mL

## 2013-11-15 MED ORDER — ADULT MULTIVITAMIN W/MINERALS CH
1.0000 | ORAL_TABLET | Freq: Every day | ORAL | Status: DC
  Administered 2013-11-15 – 2013-11-18 (×4): 1 via ORAL
  Filled 2013-11-15 (×5): qty 1

## 2013-11-15 MED ORDER — CLINDAMYCIN PHOSPHATE 600 MG/50ML IV SOLN
600.0000 mg | Freq: Four times a day (QID) | INTRAVENOUS | Status: AC
  Administered 2013-11-15 – 2013-11-16 (×3): 600 mg via INTRAVENOUS
  Filled 2013-11-15 (×3): qty 50

## 2013-11-15 MED ORDER — KETOROLAC TROMETHAMINE 30 MG/ML IJ SOLN
INTRAMUSCULAR | Status: DC | PRN
  Administered 2013-11-15: 30 mg via INTRAVENOUS

## 2013-11-15 MED ORDER — DIPHENHYDRAMINE HCL 12.5 MG/5ML PO ELIX: 12.5000 mg | ORAL_SOLUTION | Freq: Four times a day (QID) | ORAL | Status: DC | PRN

## 2013-11-15 MED ORDER — PHENYLEPHRINE HCL 10 MG/ML IJ SOLN
10.0000 mg | INTRAVENOUS | Status: DC | PRN
  Administered 2013-11-15: 40 ug/min via INTRAVENOUS

## 2013-11-15 MED ORDER — BUPROPION HCL ER (XL) 300 MG PO TB24
300.0000 mg | ORAL_TABLET | Freq: Every morning | ORAL | Status: DC
  Administered 2013-11-15 – 2013-11-18 (×4): 300 mg via ORAL
  Filled 2013-11-15 (×5): qty 1

## 2013-11-15 MED ORDER — GENTAMICIN SULFATE 40 MG/ML IJ SOLN
INTRAMUSCULAR | Status: DC | PRN
  Administered 2013-11-15: 09:00:00 via INTRAPERITONEAL

## 2013-11-15 MED ORDER — HYDROMORPHONE HCL PF 1 MG/ML IJ SOLN
0.5000 mg | INTRAMUSCULAR | Status: DC | PRN
  Administered 2013-11-16: 1 mg via INTRAVENOUS
  Administered 2013-11-17: 0.5 mg via INTRAVENOUS
  Filled 2013-11-15 (×2): qty 1

## 2013-11-15 MED ORDER — ROCURONIUM BROMIDE 100 MG/10ML IV SOLN
INTRAVENOUS | Status: AC
  Filled 2013-11-15: qty 1

## 2013-11-15 MED ORDER — HYDROMORPHONE HCL PF 1 MG/ML IJ SOLN
INTRAMUSCULAR | Status: DC | PRN
  Administered 2013-11-15 (×4): 0.5 mg via INTRAVENOUS

## 2013-11-15 MED ORDER — LACTATED RINGERS IV BOLUS (SEPSIS)
1000.0000 mL | Freq: Three times a day (TID) | INTRAVENOUS | Status: AC | PRN
  Administered 2013-11-16: 1000 mL via INTRAVENOUS

## 2013-11-15 MED ORDER — ROCURONIUM BROMIDE 100 MG/10ML IV SOLN
INTRAVENOUS | Status: DC | PRN
  Administered 2013-11-15: 5 mg via INTRAVENOUS
  Administered 2013-11-15: 50 mg via INTRAVENOUS

## 2013-11-15 SURGICAL SUPPLY — 93 items
APPLIER CLIP 5 13 M/L LIGAMAX5 (MISCELLANEOUS)
APPLIER CLIP ROT 10 11.4 M/L (STAPLE)
BLADE EXTENDED COATED 6.5IN (ELECTRODE) ×3 IMPLANT
BLADE HEX COATED 2.75 (ELECTRODE) ×6 IMPLANT
BLADE SURG SZ10 CARB STEEL (BLADE) ×3 IMPLANT
CABLE HIGH FREQUENCY MONO STRZ (ELECTRODE) IMPLANT
CANISTER SUCTION 2500CC (MISCELLANEOUS) IMPLANT
CATH KIT ON Q 7.5IN SLV (PAIN MANAGEMENT) ×6 IMPLANT
CELLS DAT CNTRL 66122 CELL SVR (MISCELLANEOUS) IMPLANT
CHLORAPREP W/TINT 26ML (MISCELLANEOUS) ×3 IMPLANT
CLIP APPLIE 5 13 M/L LIGAMAX5 (MISCELLANEOUS) IMPLANT
CLIP APPLIE ROT 10 11.4 M/L (STAPLE) IMPLANT
CLIP LIGATING HEM O LOK PURPLE (MISCELLANEOUS) ×3 IMPLANT
CLIP LIGATING HEMO O LOK GREEN (MISCELLANEOUS) IMPLANT
CLIP LIGATING HEMOLOK MED (MISCELLANEOUS) IMPLANT
COVER MAYO STAND STRL (DRAPES) ×6 IMPLANT
COVER TIP SHEARS 8 DVNC (MISCELLANEOUS) ×2 IMPLANT
COVER TIP SHEARS 8MM DA VINCI (MISCELLANEOUS) ×1
DECANTER SPIKE VIAL GLASS SM (MISCELLANEOUS) ×3 IMPLANT
DRAIN CHANNEL 19F RND (DRAIN) IMPLANT
DRAPE ARM DVNC X/XI (DISPOSABLE) ×8 IMPLANT
DRAPE CAMERA CLOSED 9X96 (DRAPES) ×3 IMPLANT
DRAPE COLUMN DVNC XI (DISPOSABLE) ×2 IMPLANT
DRAPE DA VINCI XI ARM (DISPOSABLE) ×4
DRAPE DA VINCI XI COLUMN (DISPOSABLE) ×1
DRAPE LG THREE QUARTER DISP (DRAPES) ×6 IMPLANT
DRAPE UTILITY XL STRL (DRAPES) ×6 IMPLANT
DRAPE WARM FLUID 44X44 (DRAPE) ×6 IMPLANT
DRSG OPSITE POSTOP 4X10 (GAUZE/BANDAGES/DRESSINGS) IMPLANT
DRSG OPSITE POSTOP 4X6 (GAUZE/BANDAGES/DRESSINGS) IMPLANT
DRSG OPSITE POSTOP 4X8 (GAUZE/BANDAGES/DRESSINGS) ×3 IMPLANT
DRSG TEGADERM 2-3/8X2-3/4 SM (GAUZE/BANDAGES/DRESSINGS) IMPLANT
DRSG TEGADERM 4X4.75 (GAUZE/BANDAGES/DRESSINGS) IMPLANT
ELECT REM PT RETURN 9FT ADLT (ELECTROSURGICAL) ×3
ELECTRODE REM PT RTRN 9FT ADLT (ELECTROSURGICAL) ×2 IMPLANT
ENDOLOOP SUT PDS II  0 18 (SUTURE)
ENDOLOOP SUT PDS II 0 18 (SUTURE) IMPLANT
GLOVE BIO SURGEON STRL SZ 6.5 (GLOVE) ×9 IMPLANT
GLOVE BIOGEL PI IND STRL 7.0 (GLOVE) ×6 IMPLANT
GLOVE BIOGEL PI IND STRL 7.5 (GLOVE) ×12 IMPLANT
GLOVE BIOGEL PI INDICATOR 7.0 (GLOVE) ×3
GLOVE BIOGEL PI INDICATOR 7.5 (GLOVE) ×6
GLOVE ECLIPSE 8.0 STRL XLNG CF (GLOVE) ×9 IMPLANT
GLOVE INDICATOR 8.0 STRL GRN (GLOVE) ×9 IMPLANT
GOWN STRL REUS W/TWL 2XL LVL3 (GOWN DISPOSABLE) ×9 IMPLANT
GOWN STRL REUS W/TWL XL LVL3 (GOWN DISPOSABLE) ×18 IMPLANT
KIT BASIN OR (CUSTOM PROCEDURE TRAY) ×6 IMPLANT
LEGGING LITHOTOMY PAIR STRL (DRAPES) ×3 IMPLANT
NEEDLE HYPO 22GX1.5 SAFETY (NEEDLE) ×3 IMPLANT
PACK CARDIOVASCULAR III (CUSTOM PROCEDURE TRAY) ×3 IMPLANT
PACK GENERAL/GYN (CUSTOM PROCEDURE TRAY) ×3 IMPLANT
PENCIL BUTTON HOLSTER BLD 10FT (ELECTRODE) ×3 IMPLANT
PUMP PAIN ON-Q (MISCELLANEOUS) ×3 IMPLANT
RTRCTR WOUND ALEXIS 18CM MED (MISCELLANEOUS)
SCISSORS LAP 5X35 DISP (ENDOMECHANICALS) IMPLANT
SCRUB PCMX 4 OZ (MISCELLANEOUS) ×3 IMPLANT
SEAL CANN UNIV 5-8 DVNC XI (MISCELLANEOUS) ×8 IMPLANT
SEAL XI 5MM-8MM UNIVERSAL (MISCELLANEOUS) ×4
SEALER TISSUE G2 STRG ARTC 35C (ENDOMECHANICALS) IMPLANT
SET IRRIG TUBING LAPAROSCOPIC (IRRIGATION / IRRIGATOR) IMPLANT
SET TUBE IRRIG SUCTION NO TIP (IRRIGATION / IRRIGATOR) ×3 IMPLANT
SLEEVE XCEL OPT CAN 5 100 (ENDOMECHANICALS) IMPLANT
SOLUTION ELECTROLUBE (MISCELLANEOUS) ×3 IMPLANT
SPONGE GAUZE 4X4 12PLY (GAUZE/BANDAGES/DRESSINGS) ×3 IMPLANT
SPONGE LAP 18X18 X RAY DECT (DISPOSABLE) IMPLANT
STAPLER CIRC CVD 29MM 37CM (STAPLE) ×3 IMPLANT
STAPLER CUT CVD 40MM BLUE (STAPLE) ×3 IMPLANT
STAPLER CUT RELOAD BLUE (STAPLE) ×3 IMPLANT
SUCTION POOLE TIP (SUCTIONS) ×3 IMPLANT
SUT MNCRL AB 4-0 PS2 18 (SUTURE) ×3 IMPLANT
SUT PDS AB 1 CTX 36 (SUTURE) ×3 IMPLANT
SUT PROLENE 0 CT 2 (SUTURE) ×3 IMPLANT
SUT SILK 2 0 (SUTURE) ×1
SUT SILK 2 0 SH CR/8 (SUTURE) ×3 IMPLANT
SUT SILK 2-0 18XBRD TIE 12 (SUTURE) ×2 IMPLANT
SUT SILK 3 0 (SUTURE) ×1
SUT SILK 3 0 SH CR/8 (SUTURE) ×3 IMPLANT
SUT SILK 3-0 18XBRD TIE 12 (SUTURE) ×2 IMPLANT
SUT VICRYL 0 TIES 12 18 (SUTURE) ×3 IMPLANT
SUT VICRYL 0 UR6 27IN ABS (SUTURE) ×3 IMPLANT
SYR 20CC LL (SYRINGE) ×3 IMPLANT
SYR BULB IRRIGATION 50ML (SYRINGE) IMPLANT
SYS LAPSCP GELPORT 120MM (MISCELLANEOUS)
SYSTEM LAPSCP GELPORT 120MM (MISCELLANEOUS) IMPLANT
TAPE UMBILICAL COTTON 1/8X30 (MISCELLANEOUS) ×3 IMPLANT
TOWEL OR 17X26 10 PK STRL BLUE (TOWEL DISPOSABLE) ×6 IMPLANT
TOWEL OR NON WOVEN STRL DISP B (DISPOSABLE) ×6 IMPLANT
TRAY FOLEY CATH 14FRSI W/METER (CATHETERS) ×3 IMPLANT
TROCAR BLADELESS OPT 5 100 (ENDOMECHANICALS) ×3 IMPLANT
TUBING CONNECTING 10 (TUBING) IMPLANT
TUBING FILTER THERMOFLATOR (ELECTROSURGICAL) ×3 IMPLANT
TUNNELER SHEATH ON-Q 16GX12 DP (PAIN MANAGEMENT) IMPLANT
YANKAUER SUCT BULB TIP 10FT TU (MISCELLANEOUS) ×3 IMPLANT

## 2013-11-15 NOTE — Anesthesia Preprocedure Evaluation (Addendum)
Anesthesia Evaluation  Patient identified by MRN, date of birth, ID band Patient awake    Reviewed: Allergy & Precautions, H&P , NPO status , Patient's Chart, lab work & pertinent test results  Airway Mallampati: II TM Distance: >3 FB Neck ROM: Full    Dental no notable dental hx.    Pulmonary neg pulmonary ROS,  breath sounds clear to auscultation  Pulmonary exam normal       Cardiovascular hypertension, Pt. on medications negative cardio ROS  + pacemaker + Valvular Problems/Murmurs MVP Rhythm:Regular Rate:Normal     Neuro/Psych negative neurological ROS  negative psych ROS   GI/Hepatic negative GI ROS, Neg liver ROS, hiatal hernia, GERD-  Medicated and Controlled,  Endo/Other  negative endocrine ROS  Renal/GU negative Renal ROS  negative genitourinary   Musculoskeletal negative musculoskeletal ROS (+)   Abdominal   Peds negative pediatric ROS (+)  Hematology negative hematology ROS (+)   Anesthesia Other Findings   Reproductive/Obstetrics negative OB ROS                           Anesthesia Physical Anesthesia Plan  ASA: III  Anesthesia Plan: General   Post-op Pain Management:    Induction: Intravenous  Airway Management Planned: Oral ETT  Additional Equipment:   Intra-op Plan:   Post-operative Plan: Extubation in OR  Informed Consent: I have reviewed the patients History and Physical, chart, labs and discussed the procedure including the risks, benefits and alternatives for the proposed anesthesia with the patient or authorized representative who has indicated his/her understanding and acceptance.   Dental advisory given  Plan Discussed with: CRNA  Anesthesia Plan Comments:         Anesthesia Quick Evaluation

## 2013-11-15 NOTE — Transfer of Care (Signed)
Immediate Anesthesia Transfer of Care Note  Patient: Carmen Green  Procedure(s) Performed: Procedure(s) (LRB): ROBOT ASSISTED LAPAROSCOPIC  LOWE ANTERIOR RESECTION, LEFT SALPINGO OOPHERECTOMY, OMENPEXY (N/A) RIGID PROCTOSCOPY LAPAROSCOPIC LYSIS OF ADHESIONS (N/A)  Patient Location: PACU  Anesthesia Type: General  Level of Consciousness: sedated, patient cooperative and responds to stimulation  Airway & Oxygen Therapy: Patient Spontanous Breathing and Patient connected to face mask oxgen  Post-op Assessment: Report given to PACU RN and Post -op Vital signs reviewed and stable  Post vital signs: Reviewed and stable  Complications: No apparent anesthesia complications

## 2013-11-15 NOTE — Progress Notes (Signed)
PACU Nsg Note: Spoke w/ P. Marcelle Smiling, MD (Anesthesiologist) regards to pts pacemaker and possible interrogation of device post operatively, per MDA, Pts Electrogphysiologist and MDA state there is no need to have pacemaker interrogated post operatively in PACU, will monitor pt as per PACU orders and notify MDA of any cardiac changes.  Stormy Card, BSN, RN, RRT, CPAN, CCRN

## 2013-11-15 NOTE — Anesthesia Postprocedure Evaluation (Signed)
  Anesthesia Post-op Note  Patient: Carmen Green  Procedure(s) Performed: Procedure(s) (LRB): ROBOT ASSISTED LAPAROSCOPIC  LOWE ANTERIOR RESECTION, LEFT SALPINGO OOPHERECTOMY, OMENPEXY (N/A) RIGID PROCTOSCOPY LAPAROSCOPIC LYSIS OF ADHESIONS (N/A)  Patient Location: PACU  Anesthesia Type: General  Level of Consciousness: awake and alert   Airway and Oxygen Therapy: Patient Spontanous Breathing  Post-op Pain: mild  Post-op Assessment: Post-op Vital signs reviewed, Patient's Cardiovascular Status Stable, Respiratory Function Stable, Patent Airway and No signs of Nausea or vomiting  Last Vitals:  Filed Vitals:   11/15/13 1325  BP: 117/63  Pulse: 75  Temp: 36.8 C  Resp: 16    Post-op Vital Signs: stable   Complications: No apparent anesthesia complications

## 2013-11-15 NOTE — Discharge Instructions (Signed)
ABDOMINAL SURGERY: POST OP INSTRUCTIONS ° °1. DIET: Follow a light bland diet the first 24 hours after arrival home, such as soup, liquids, crackers, etc.  Be sure to include lots of fluids daily.  Avoid fast food or heavy meals as your are more likely to get nauseated.  Eat a low fat the next few days after surgery.   °2. Take your usually prescribed home medications unless otherwise directed. °3. PAIN CONTROL: °a. Pain is best controlled by a usual combination of three different methods TOGETHER: °i. Ice/Heat °ii. Over the counter pain medication °iii. Prescription pain medication °b. Most patients will experience some swelling and bruising around the incisions.  Ice packs or heating pads (30-60 minutes up to 6 times a day) will help. Use ice for the first few days to help decrease swelling and bruising, then switch to heat to help relax tight/sore spots and speed recovery.  Some people prefer to use ice alone, heat alone, alternating between ice & heat.  Experiment to what works for you.  Swelling and bruising can take several weeks to resolve.   °c. It is helpful to take an over-the-counter pain medication regularly for the first few weeks.  Choose one of the following that works best for you: °i. Naproxen (Aleve, etc)  Two 220mg tabs twice a day °ii. Ibuprofen (Advil, etc) Three 200mg tabs four times a day (every meal & bedtime) °iii. Acetaminophen (Tylenol, etc) 500-650mg four times a day (every meal & bedtime) °d. A  prescription for pain medication (such as oxycodone, hydrocodone, etc) should be given to you upon discharge.  Take your pain medication as prescribed.  °i. If you are having problems/concerns with the prescription medicine (does not control pain, nausea, vomiting, rash, itching, etc), please call us [phone] to see if we need to switch you to a different pain medicine that will work better for you and/or control your side effect better. °ii. If you need a refill on your pain medication,  please contact your pharmacy.  They will contact our office to request authorization. Prescriptions will not be filled after 5 pm or on week-ends. °4. Avoid getting constipated.  Between the surgery and the pain medications, it is common to experience some constipation.  Increasing fluid intake and taking a fiber supplement (such as Metamucil, Citrucel, FiberCon, MiraLax, etc) 1-2 times a day regularly will usually help prevent this problem from occurring.  A mild laxative (prune juice, Milk of Magnesia, MiraLax, etc) should be taken according to package directions if there are no bowel movements after 48 hours.   °5. Watch out for diarrhea.  If you have many loose bowel movements, simplify your diet to bland foods & liquids for a few days.  Stop any stool softeners and decrease your fiber supplement.  Switching to mild anti-diarrheal medications (Kayopectate, Pepto Bismol) can help.  If this worsens or does not improve, please call us. °6. Wash / shower every day.  You may shower over the incision / wound.  Avoid baths until the skin is fully healed.  Continue to shower over incision(s) after the dressing is off. °7. Remove your waterproof bandages 5 days after surgery.  You may leave the incision open to air.  You may replace a dressing/Band-Aid to cover the incision for comfort if you wish. °8. ACTIVITIES as tolerated:   °a. You may resume regular (light) daily activities beginning the next day--such as daily self-care, walking, climbing stairs--gradually increasing activities as tolerated.  If you can   walk 30 minutes without difficulty, it is safe to try more intense activity such as jogging, treadmill, bicycling, low-impact aerobics, swimming, etc. b. Save the most intensive and strenuous activity for last such as sit-ups, heavy lifting, contact sports, etc  Refrain from any heavy lifting or straining until you are off narcotics for pain control.   c. DO NOT PUSH THROUGH PAIN.  Let pain be your guide: If it  hurts to do something, don't do it.  Pain is your body warning you to avoid that activity for another week until the pain goes down. d. You may drive when you are no longer taking prescription pain medication, you can comfortably wear a seatbelt, and you can safely maneuver your car and apply brakes. e. Dennis Bast may have sexual intercourse when it is comfortable.  9. FOLLOW UP in our office a. Please call [facility] at [phone] to set up an appointment to see your surgeon in the office for a follow-up appointment approximately 1-2 weeks after your surgery. b. Make sure that you call for this appointment the day you arrive home to insure a convenient appointment time. 10. IF YOU HAVE DISABILITY OR FAMILY LEAVE FORMS, BRING THEM TO THE OFFICE FOR PROCESSING.  DO NOT GIVE THEM TO YOUR DOCTOR.   WHEN TO CALL us [phone]: 1. Poor pain control 2. Reactions / problems with new medications (rash/itching, nausea, etc)  3. Fever over 101.5 F (38.5 C) 4. Inability to urinate 5. Nausea and/or vomiting 6. Worsening swelling or bruising 7. Continued bleeding from incision. 8. Increased pain, redness, or drainage from the incision  The clinic staff is available to answer your questions during regular business hours (8:30am-5pm).  Please dont hesitate to call and ask to speak to one of our nurses for clinical concerns.   A surgeon from [facility] is always on call at the hospitals   If you have a medical emergency, go to the nearest emergency room or call 911.    [facility], [address], [place], [place],   [address] ? MAIN: [phone] ? TOLL FREE: [phone] ? FAX [phone] [email or web address]   Patient Education Sheet  ON-Q Pain Relief System   What is the ON-Q?  The ON-Q is a balloon-type pump filled with a medicine to treat your pain. It blocks the pain in the area of your procedure. With ON-Q, you may have better pain  relief and need less pain medicine. Its small size allows you to move around freely.  How does the ON-Q work?  The pump is attached to a catheter (small tube) near your procedure site. The pump automatically pushes the medicine through the tube. DO NOT squeeze the pump. The pump may be clipped to your clothing or dressing or placed in a small carrying case.   How do I know the pump is working?  The pump gives your medicine very slowly. It may take longer than 24 hours after your procedure to notice a change in the size and look of the pump. Your pump may look like the picture.  In time, the outside bag on the pump will get looser and wrinkles will form in the bag.  Talk to your doctor about taking other pain medication as needed.  Check the following:  o Make sure the white clamp on the tubing is open (moves freely on the tubing).  o Make sure there are no kinks in the pump tubing.  o Do not  tape or cover the filter.   What should I do with my ON-Q when sleeping?  Make sure the pump is placed on a bedside table or on top of bed covers.  Do not place the pump underneath the bed covers where the pump may become too warm.  Do not place the pump on the floor or hang the pump on a bedpost.   Can I take a shower with ON-Q?  Your doctor will tell you when its ok for you to shower.  Take care to protect the catheter site, pump and filter from water.  Do not submerge the pump in water.   How long will my ON-Q last?  Depending on the size of your pump, it may take 4-5 days to give you all the medicine. All your medicine has been delivered when the outside bag is flat and a hard tube can be felt in the middle of the pump.   Troubleshooting  Tubing Disconnection  If the pump tubing becomes disconnected from your catheter, Remove all dressings and catheters and throw the On-Q pump away  Leaking  If leaking from the pump or pump tubing occurs, then remove all dressings and catheters and throw the  On-Q pump away   When should I call the surgeon?  Be aware that you may experience loss of feeling (numbness) at and around the area of your procedure. If this occurs, be careful not to hurt yourself. Be careful when placing hot or cold items on the numb area.  The following symptoms may represent a serious medical condition.  Immediately close the clamp on the pump tubing and call your doctor or 911 in case of emergency.  Increase in pain  Fever, chills, sweats  Bowel or bladder changes  Difficulty breathing  Redness, warmth, or discharge or excessive bleeding from the catheter site  Dizziness or lightheadedness  Blurred vision  Ringing or buzzing in your ears  Metal taste in your mouth  Numbness or tingling around your mouth, fingers, or toes  Drowsiness  Confusion   How to remove the ON-Q catheter?  If your doctor has instructed you to remove the ON-Q catheter, follow their instructions keeping in mind these steps:  -Remove the dressing covering the catheter site.  -Remove any skin adhesive strips.  -Grasp the catheter close to the skin and gently pull on the catheter. -It should be easy to remove and not painful.  -If it becomes difficult STOP and call your doctor.  -Do not cut or pull hard to remove the catheter.  -Once removed, check the catheter tip for a black marking to ensure the entire catheter was removed. Call your doctor if you do not see the black marking.  Place bandage over the catheter site as instructed by your doctor.   Source: [employer], ON-Q, and [employer] are registered trademarks and [employer] and [employer] are trademarks of [employer].  2011 [employer]. All right reserved.  10/  Managing Pain  Pain after surgery or related to activity is often due to strain/injury to muscle, tendon, nerves and/or incisions.  This pain is usually short-term and will improve in a few months.   Many people find it helpful to do the following things  TOGETHER to help speed the process of healing and to get back to regular activity more quickly:  1. Avoid heavy physical activity a.  no lifting greater than 20 pounds b. Do not push through the pain.  Listen to your body and  avoid positions and maneuvers than reproduce the pain c. Walking is okay as tolerated, but go slowly and stop when getting sore.  d. Remember: If it hurts to do it, then dont do it! 2. Take Anti-inflammatory medication  a. Take with food/snack around the clock for 1-2 weeks i. This helps the muscle and nerve tissues become less irritable and calm down faster b. Choose ONE of the following over-the-counter medications: i. Naproxen 220mg  tabs (ex. Aleve) 1-2 pills twice a day  ii. Ibuprofen 200mg  tabs (ex. Advil, Motrin) 3-4 pills with every meal and just before bedtime iii. Acetaminophen 500mg  tabs (Tylenol) 1-2 pills with every meal and just before bedtime 3. Use a Heating pad or Ice/Cold Pack a. 4-6 times a day b. May use warm bath/hottub  or showers 4. Try Gentle Massage and/or Stretching  a. at the area of pain many times a day b. stop if you feel pain - do not overdo it  Try these steps together to help you body heal faster and avoid making things get worse.  Doing just one of these things may not be enough.    If you are not getting better after two weeks or are noticing you are getting worse, contact our office for further advice; we may need to re-evaluate you & see what other things we can do to help.

## 2013-11-15 NOTE — H&P (Signed)
[place], Carmen Green, [address] [place], [place] [address] Phone: [phone] FAX: [place]  1950-09-26 [id number]  CARE TEAM:  PCP: Octavio Graves, DO  Outpatient Care Team: Patient Care Team: Octavio Graves, DO as PCP - General Altamese Dilling, RN as Registered Nurse Lafayette Dragon, Carmen Green as Consulting Physician (Gastroenterology) Adin Hector, Carmen Green as Consulting Physician (General Surgery) Larey Dresser, Carmen Green as Consulting Physician (Cardiology)  Inpatient Treatment Team: Treatment Team: Attending Provider: Adin Hector, Carmen Green  This patient is a 63 y.o.female who presents today for surgical evaluation  Reason for visit: Recurrent sigmoid diverticulitis.   Pleasant female with PMHx of cardiac dysrhythmias control is a pacemaker for over 20 years. No evidence for that aside from changing the batteries out. Followed by cardiology in town.   She an episode of severe pain in 2012. Diagnosed with diverticulitis. Left lower quadrant and flank pain. Radiating to left groin. Nausea and vomiting. She had a 3.5 cm abscess. She required admission and IV antibiotics. Eventually had improved. Endoscopy revealed diverticulosis. Perhaps mild narrowing but no severe stricture. Had done well until last month when had a severe episode of pain. Worse. Concern for diverticulitis. Refused admission. Eventually improved on oral antibiotics. However she had recurrent attack earlier this month, a few weeks after this. The most intense. Required oral narcotics. Eventually improved with 10 days of oral ciprofloxacin/metronidazole. She has been off antibiotics for a week. She never wants to go through another attack. Her gastroenterologist recommended considering surgery.  She normally has a bowel movement 2 times a day. She can walk a half hour without difficulty. She had a tubal ligation 1985 & open ovarian cyst  removal in the 1971. No other abdominal surgeries. She has never smoked. No history of MRSA or skin infections. She is not on any anticoagulation.   No new events   Past Medical History  Diagnosis Date  . Hypertension   . Complete heart block   . Hyperlipidemia     s/p PPM implant by Dr Olevia Perches 1992 with generator change 2004 ([facility]), she is device dependant  . Mitral valve prolapse   . Anxiety   . Hiatal hernia   . GERD (gastroesophageal reflux disease)   . Colon polyp   . Esophagitis   . Diverticulitis   . Pacemaker   . Heart murmur   . H/O seasonal allergies   . Arthritis     Past Surgical History  Procedure Laterality Date  . Left ovary cyst removal  1972  . Pacemaker insertion  1992, 2004, 2012    device dependant  . Tubal ligation    . Colonoscopy  10/19/2004    [facility]  . Polypectomy    . Appendectomy    . Breast surgery Right     biopsy  . Eye surgery Left     cataract extraction with IOL    History   Social History  . Marital Status: Married    Spouse Name: N/A    Number of Children: 1  . Years of Education: N/A   Occupational History  . REGISTRATION    Social History Main Topics  . Smoking status: Never Smoker   . Smokeless tobacco: Never Used  . Alcohol Use: No  . Drug Use: No  . Sexual Activity: Not on file   Other Topics Concern  . Not on file  Social History Narrative   Lives with spouse in [place]   Works for an orthopoedic office in [place]          Family History  Problem Relation Age of Onset  . Diabetes Mother   . Stroke Mother   . Heart disease Maternal Aunt   . Heart disease Maternal Grandmother   . Colon cancer Neg Hx   . Diabetes Father   . Lung cancer Maternal Grandfather   . Lung cancer Father     Current Facility-Administered Medications  Medication Dose Route Frequency Provider Last Rate Last Dose  . acetaminophen (OFIRMEV) IV 1,000 mg  1,000 mg Intravenous Once Montel Clock, CRNA      . bupivacaine 0.25 %  ON-Q pump DUAL CATH 300 mL  300 mL Other Continuous Adin Hector, Carmen Green      . clindamycin (CLEOCIN) 900 mg, gentamicin (GARAMYCIN) 240 mg in sodium chloride 0.9 % 1,000 mL for intraperitoneal lavage   Intraperitoneal To OR Adin Hector, Carmen Green      . clindamycin (CLEOCIN) IVPB 900 mg  900 mg Intravenous On Call to OR Adin Hector, Carmen Green      . gentamicin (GARAMYCIN) 320 mg in dextrose 5 % 100 mL IVPB  320 mg Intravenous On Call Adin Hector, Carmen Green       Facility-Administered Medications Ordered in Other Encounters  Medication Dose Route Frequency Provider Last Rate Last Dose  . lactated ringers infusion    Continuous PRN Montel Clock, CRNA         Allergies  Allergen Reactions  . Cephalexin Rash  . Penicillins Rash    ROS: Constitutional:  No fevers, chills, sweats.  Weight stable Eyes:  No vision changes, No discharge HENT:  No sore throats, nasal drainage Lymph: No neck swelling, No bruising easily Pulmonary:  No cough, productive sputum CV: No orthopnea, PND.  No exertional chest/neck/shoulder/arm pain. GI:  No personal nor family history of GI/colon cancer, inflammatory bowel disease, irritable bowel syndrome, allergy such as Celiac Sprue, dietary/dairy problems, colitis, ulcers nor gastritis.  No recent sick contacts/gastroenteritis.  No travel outside the country.  No changes in diet. Renal: No UTIs, No hematuria Genital:  No drainage, bleeding, masses Musculoskeletal: No severe joint pain.  Good ROM major joints Skin:  No sores or lesions.  No rashes Heme/Lymph:  No easy bleeding.  No swollen lymph nodes Neuro: No focal weakness/numbness.  No seizures Psych: No suicidal ideation.  No hallucinations  BP 127/75  Pulse 89  Temp(Src) 98.1 F (36.7 C) (Oral)  Resp 18  SpO2 100%  Physical Exam: General: Pt awake/alert/oriented x4 in no major acute distress Eyes: PERRL, normal EOM. Sclera nonicteric Neuro: CN II-XII intact w/o focal sensory/motor deficits. Lymph: No  head/neck/groin lymphadenopathy Psych:  No delerium/psychosis/paranoia HENT: Normocephalic, Mucus membranes moist.  No thrush Neck: Supple, No tracheal deviation Chest: No pain.  Good respiratory excursion. CV:  Pulses intact.  Regular rhythm Abdomen: Soft, Nondistended.  Nontender.  No incarcerated hernias. Ext:  SCDs BLE.  No significant edema.  No cyanosis Skin: No petechiae / purpurea.  No major sores Musculoskeletal: No severe joint pain.  Good ROM major joints   Results:   Labs: No results found for this or any previous visit (from the past 48 hour(s)).  Imaging / Studies: Dg Chest 2 View  11/09/2013   CLINICAL DATA:  Preoperative partial colectomy; hypertension ; cardiac arrhythmia  EXAM: CHEST  2 VIEW  COMPARISON:  October 02, 2007  FINDINGS: Pacemaker leads are attached to the right atrium and right ventricle. No pneumothorax. Lungs are clear. Heart size and pulmonary vascularity are normal. No adenopathy. There is mild degenerative change in the thoracic spine.  IMPRESSION: No edema or consolidation.   Electronically Signed   By: Lowella Grip M.D.   On: 11/09/2013 15:20    Medications / Allergies: per chart  Antibiotics: Anti-infectives   Start     Dose/Rate Route Frequency Ordered Stop   11/15/13 0600  clindamycin (CLEOCIN) IVPB 900 mg    Comments:  Pharmacy may adjust dosing strength, interval, or rate of medication as needed for optimal therapy for the patient Send with patient on call to the OR.  Anesthesia to complete antibiotic administration <41min prior to incision per [facility].   900 mg 100 mL/hr over 30 Minutes Intravenous On call to O.R. 11/15/13 0520 11/16/13 0559   11/15/13 0530  clindamycin (CLEOCIN) 900 mg, gentamicin (GARAMYCIN) 240 mg in sodium chloride 0.9 % 1,000 mL for intraperitoneal lavage    Comments:  Pharmacy may adjust dosing strength, schedule, rate of infusion, etc as needed to optimize therapy    Intraperitoneal To Surgery 11/15/13 0520  11/16/13 0530   11/15/13 0000  gentamicin (GARAMYCIN) 320 mg in dextrose 5 % 100 mL IVPB     320 mg 108 mL/hr over 60 Minutes Intravenous On call 11/14/13 1639 11/16/13 0000      Assessment  [name]  63 y.o. female  Day of Surgery  Procedure(s): ROBOT ASSISTED LAPAROSCOPIC PARTIAL COLECTOMY  Problem List:  Principal Problem:   Diverticulitis of colon - recurrent   At least 3 documented episodes of sigmoid diverticulitis. One with abscess. One recurrent. Risk of recurrent attacks very high.   Plan:   Resection of her sigmoid diverticulitis.  She wishs to be aggressive and do colectomy. Her husband agrees. I think she would be a good candidate for a minimally invasive approach. Start out robotically:   The anatomy & physiology of the digestive tract was discussed. The pathophysiology of the colon was discussed. Natural history risks without surgery was discussed. I feel the risks of no intervention will lead to serious problems that outweigh the operative risks; therefore, I recommended a partial colectomy to remove the pathology. Minimally invasive (Robotic/Laparoscopic) & open techniques were discussed.  Risks such as bleeding, infection, abscess, leak, reoperation, possible ostomy, hernia, heart attack, death, and other risks were discussed. I noted a good likelihood this will help address the problem. Goals of post-operative recovery were discussed as well. Need for bowel regimen and healthy physical activity to optimize recovery noted as well. We will work to minimize complications. Educational materials were given as well. Questions were answered. The patient expresses understanding & wishes to proceed with surgery.        Adin Hector, M.D., F.A.C.S. Gastrointestinal and Minimally Invasive Surgery [facility] [address] [place], [place] [address] [phone] Main / Paging   11/15/2013  Note: This dictation was prepared  with Dragon/digital dictation along with [facility] technology. Any transcriptional errors that result from this process are unintentional.

## 2013-11-15 NOTE — Op Note (Signed)
11/15/2013  12:02 PM  PATIENT:  Carmen Green  63 y.o. female  Patient Care Team: Octavio Graves, DO as PCP - General Altamese Dilling, RN as Registered Nurse Lafayette Dragon, MD as Consulting Physician (Gastroenterology) Adin Hector, MD as Consulting Physician (General Surgery) Larey Dresser, MD as Consulting Physician (Cardiology)  PRE-OPERATIVE DIAGNOSIS:  recurrent sigmoid diverticulitis   POST-OPERATIVE DIAGNOSIS:  recurrent sigmoid diverticulitis  PROCEDURE:  Procedure(s): ROBOTIC LYSIS OF ADHESIONS X 80 MIN ROBOTIC ASSISTED LAPAROSCOPIC LOW ANTERIOR RESECTION LEFT SALPINGO OOPHORECTOMY OMENTOPEXY RIGID PROCTOSCOPY   SURGEON:  Surgeon(s): Adin Hector, MD  ASSISTANT: Leighton Ruff, MD   ANESTHESIA:   local and general  EBL:  Total I/O In: 2100 [I.V.:2100] Out: 400 [Urine:300; Blood:100]  Delay start of Pharmacological VTE agent (>24hrs) due to surgical blood loss or risk of bleeding:  no  DRAINS: None   SPECIMEN:    Rectosigmoid colon.  Open end proximal  New proximal/mid rectum. - silk stitch in distal margin  Anastomotic rings.  Blue stitch in proximal ring.  Stapled ring is true FINAL distal margin.  DISPOSITION OF SPECIMEN:  PATHOLOGY  COUNTS:  YES  PLAN OF CARE: Admit to inpatient   PATIENT DISPOSITION:  PACU - hemodynamically stable.  INDICATION:    Pleasant woman with recurrent episodes of diverticulitis.  One severe enough with an abscess.  Now with recurrent infections this year.  Surgical consultation recommended by her gastroenterologist.  I recommended segmental resection:  The anatomy & physiology of the digestive tract was discussed.  The pathophysiology was discussed.  Natural history risks without surgery was discussed.   I worked to give an overview of the disease and the frequent need to have multispecialty involvement.  I feel the risks of no intervention will lead to serious problems that outweigh the operative risks;  therefore, I recommended a partial colectomy to remove the pathology.  Laparoscopic & open techniques were discussed.   Risks such as bleeding, infection, abscess, leak, reoperation, possible ostomy, hernia, heart attack, death, and other risks were discussed.  I noted a good likelihood this will help address the problem.   Goals of post-operative recovery were discussed as well.  We will work to minimize complications.  Educational materials on the pathology had been given in the office.  Questions were answered.    The patient expressed understanding & wished to proceed with surgery.  OR FINDINGS:   Patient had Dense adhesions at the rectosigmoid junction to the left adnexa and uterus.  Also to the gonadal and left ureter.  No obvious metastatic disease on visceral parietal peritoneum or liver.  The anastomosis rests 10 cm from the anal verge by rigid proctoscopy.  DESCRIPTION:   Informed consent was confirmed.  The patient underwent general anaesthesia without difficulty.  The patient was positioned appropriately.  VTE prevention in place.  The patient's abdomen was clipped, prepped, & draped in a sterile fashion.  Surgical timeout confirmed our plan.  The patient was positioned in reverse Trendelenburg.  Abdominal entry was gained using optical entry technique in the  right upper abdomen.  Entry was clean.  I induced carbon dioxide insufflation.  Camera inspection revealed no injury.  Extra ports were carefully placed under direct laparoscopic visualization.  I reflected the greater omentum and the upper abdomen the small bowel in the upper abdomen.  The patient was carefully positioned.  The Intuitive daVinci robot was carefully docked with camera & instruments carefully placed.  We could see the retrosigmoid  junction densely adherent to the pelvic brim and covering the left adnexa.  I scored the base of peritoneum of the medial side of the mesentery of the left colon from the ligament of  Treitz to the peritoneal reflection of the mid rectum.   I elevated the sigmoid mesentery and entered into the retro-mesenteric plane. We were able to identify the left ureter and gonadal vessels. We kept those posterior within the retroperitoneum and elevated the left colon mesentery off that.  I skeletonized the lymph nodes off the inferior mesenteric artery pedicle.  I went down to its takeoff from the aorta.  I isolated the inferior mesenteric vein off of the ligament of Treitz just cephalad to that as well.  After confirming the left ureter was out of the way, I went ahead and ligated the inferior mesenteric artery pedicle just near its takeoff from the aorta.  I did ligate the inferior mesenteric vein in a similar fashion.  We ensured hemostasis. I skeletonized the mesorectum at the junction at the proximal rectum for the distal point of resection.  I mobilized the left colon in a lateral to medial fashion off the line of Toldt up towards the splenic flexure to ensure good mobilization of the remaining left colon to reach into the pelvis.  I continued distally and got into the avascular plane posterior to the mesorectum. This allowed me to help mobilize the rectum as well by freeing the mesorectum off the sacrum.  I mobilized the peritoneal coverings towards the peritoneal reflection on both the right and left sides of the rectum.  I stayed away from the right and left ureters.  I kept the lateral vascular pedicles to the rectum intact.  Patient had very dense adhesions to the left adnexa and uterus.  End up having to resect the left adnexa.  I elevated and isolated the gonadal vessels staying away from the ureter and ligated them.  With that I could mobilize the ovary are off en bloc at the retrosigmoid junction and help free the retrosigmoid colon off the pelvic brim.  There were very dense adhesions to the left ureter.  I eventually freed that off using cold sharp scissors robotically under good  visualization.  Then isolated and transected the fallopian tube off the cuff of the uterus using the vessel Sieler.  We assured hemostasis.  The proximal rectum and was somewhat torturous and corkscrew.  When the attachment to free off the peritoneum to help straighten it out.  We chose an area in the proximal rectum that was distal to the inflamed scarred area of adnexa the red retrosigmoid junction.  I thinned out the medial rectum using a vessel sealer.  I placed a wound protector through a Pfannenstiel incision in the suprapubic region, taking care to avoid bladder injury. I transected bowel at the proximal rectum using a contour stapler. I was able to eviscerate the rectosigmoid and descending colon out the wound.  I chose a region at the descending/sigmoid junction that was soft and easily reached down. I clamped the colon proximal to this area using a soft bowel clamp. I transected at the descending/sigmoid junction with a scalpel. I got healthy bleeding mucosa. I transected the remaining specimen mesentery in a radial fashion to preserve good blood supply to the proximal colon end.  We sent the rectosigmoid colon specimen off to go to pathology.  We sized the colon orifice.  I chose a 29 EEA anvil stapler system. I placed the anvil to  the open end of the proximal remaining colon and closed around it using a 0 Prolene pursestring.  We did copious irrigation with crystalloid solution.  Hemostasis was good.  The distal end of the remaining colon easily reached down to the rectal stump, therefore, splenic flexure mobilization was not needed.      Dr Marcello Moores scrubbed down and did gentle anal dilation and advanced the EEA stapler up the rectal stump. Chief resistance at the mid rectum.  It was strictured down.  I tried to help open up still too strictured.  I therefore resected to excise this stricture of the mid rectum using a contour stapler.  Transected the median rectum close to the rectum using 2-0 silk  clamps and ties.  Dr. Marcello Moores brought the EEA stapler backup the shorter rectal stump.  The spike was brought out at the provimal end of the rectal stump under direct visualization.  I attached the anvil of the proximal colon the spike of the stapler. Anvil was tightened down and held clamped for 60 seconds. The EEA stapler was fired and held clamped for 30 seconds. The stapler was released & removed. We noted 2 excellent anastomotic rings. Blue stitch is in the proximal ring.  Dr Marcello Moores did rigid proctoscopy noted the anastomosis was at 10 cm from the anal verge consistent with the mid/proximal rectum.  We did a final irrigation of antibiotic solution (900 mg clindamycin/240 mg gentamicin in a liter of crystalloid) & held that for the pelvic air leak test .  The rectum was insufflated the rectum while clamping the colon proximal to that anastomosis.  I did place a stitch at the crotch of the colon and left lateral rectal stump.  There was a  small leak of air on the right anterior aspect.  I placed another 2-0 silk stitch there.  We reinsufflated.  We had a good watertight seal.  There is no ischemia.  There was a negative air leak test. There was no tension of mesentery or bowel at the anastomosis.   Tissues looked viable.  I went ahead and brought the transverse colon down with the greater omentum.  I laid it greater omentum over the anterior rectum and tacked that down using 2-0 silk stitches for a good omentopexy of the colorectal anastomosis.    We changed gloves.  We aspirated the antibiotic irrigation.  Hemostasis was good.   Ureters & bowel uninjured.  The anastomosis looked healthy.   We changed gown and gloves.  The patient was re-draped.  Sterile unused instruments were used from this point out per colon SSI prevention protocol.   I placed On-Q catheter and sheaths into the preperitoneal space under direct palpation.  I removed CO2 gas out through the ports.  I closed the 64mm port sites using Monocryl  stitch and sterile dressing.  I closed the Pfannenstiel wound using a 0 Vicryl vertical peritoneal closure and a #1 PDS transverse anterior rectal fascial closure. I closed the skin with some interrupted Monocryl stitches. I placed antibiotic-soaked wicks into the closure at the corners & centrally x4 between those areas. I placed a sterile dressing.  OnQ catheters placed & sheaths peeled away.  Patient is being extubated go to recovery room. I discussed postop care with the patient in detail the office & in the holding area. Instructions are written.   I updated the patient's status to the patient.  Recommendations were made.  Questions were answered.  The patient expressed understanding & appreciation.

## 2013-11-16 ENCOUNTER — Encounter (HOSPITAL_COMMUNITY): Payer: Self-pay | Admitting: Surgery

## 2013-11-16 DIAGNOSIS — K5720 Diverticulitis of large intestine with perforation and abscess without bleeding: Secondary | ICD-10-CM

## 2013-11-16 DIAGNOSIS — I10 Essential (primary) hypertension: Secondary | ICD-10-CM

## 2013-11-16 HISTORY — PX: ROBOTIC ASSISTED SALPINGO OOPHERECTOMY: SHX6082

## 2013-11-16 HISTORY — PX: LAPAROSCOPIC LOW ANTERIOR RESECTION: SHX5904

## 2013-11-16 LAB — CBC
HCT: 29.2 % — ABNORMAL LOW (ref 36.0–46.0)
Hemoglobin: 9.7 g/dL — ABNORMAL LOW (ref 12.0–15.0)
MCH: 31.6 pg (ref 26.0–34.0)
MCHC: 33.2 g/dL (ref 30.0–36.0)
MCV: 95.1 fL (ref 78.0–100.0)
PLATELETS: 197 10*3/uL (ref 150–400)
RBC: 3.07 MIL/uL — AB (ref 3.87–5.11)
RDW: 12.6 % (ref 11.5–15.5)
WBC: 13.9 10*3/uL — AB (ref 4.0–10.5)

## 2013-11-16 LAB — MAGNESIUM: Magnesium: 1.9 mg/dL (ref 1.5–2.5)

## 2013-11-16 LAB — BASIC METABOLIC PANEL
BUN: 12 mg/dL (ref 6–23)
CALCIUM: 8.3 mg/dL — AB (ref 8.4–10.5)
CO2: 23 mEq/L (ref 19–32)
Chloride: 99 mEq/L (ref 96–112)
Creatinine, Ser: 0.9 mg/dL (ref 0.50–1.10)
GFR calc Af Amer: 76 mL/min — ABNORMAL LOW (ref >90)
GFR, EST NON AFRICAN AMERICAN: 66 mL/min — AB (ref >90)
Glucose, Bld: 116 mg/dL — ABNORMAL HIGH (ref 70–99)
Potassium: 4.6 mEq/L (ref 3.7–5.3)
SODIUM: 133 meq/L — AB (ref 137–147)

## 2013-11-16 MED ORDER — ENALAPRILAT 1.25 MG/ML IV SOLN
0.6250 mg | Freq: Four times a day (QID) | INTRAVENOUS | Status: DC | PRN
  Filled 2013-11-16: qty 1

## 2013-11-16 MED ORDER — LISINOPRIL 5 MG PO TABS
5.0000 mg | ORAL_TABLET | Freq: Every day | ORAL | Status: DC
  Administered 2013-11-16: 5 mg via ORAL
  Filled 2013-11-16 (×2): qty 1

## 2013-11-16 NOTE — Care Management Note (Signed)
    Page 1 of 1   11/16/2013     10:24:02 AM CARE MANAGEMENT NOTE 11/16/2013  Patient:  Carmen Green, Carmen Green   Account Number:  [id number]  Date Initiated:  11/16/2013  Documentation initiated by:  Sunday Spillers  Subjective/Objective Assessment:   63 yo female admitted s/p robotic LAR. PTA lived at home with spouse.     Action/Plan:   Home when stable   Anticipated DC Date:  11/16/2013   Anticipated DC Plan:  [place]  CM consult      Choice offered to / List presented to:             Status of service:  Completed, signed off Medicare Important Message given?  NA - LOS <3 / Initial given by admissions (If response is "NO", the following Medicare IM given date fields will be blank) Date Medicare IM given:   Date Additional Medicare IM given:    Discharge Disposition:  HOME/SELF CARE  Per UR Regulation:  Reviewed for med. necessity/level of care/duration of stay  If discussed at [place] of Stay Meetings, dates discussed:    Comments:

## 2013-11-16 NOTE — Progress Notes (Signed)
[place], MD, [address] [place], [place] [address] Phone: [phone] FAX: [place] [phone] 06/08/1951  CARE TEAM:  PCP: Octavio Graves, DO  Outpatient Care Team: Patient Care Team: Octavio Graves, DO as PCP - General Altamese Dilling, RN as Registered Nurse Lafayette Dragon, MD as Consulting Physician (Gastroenterology) Adin Hector, MD as Consulting Physician (General Surgery) Larey Dresser, MD as Consulting Physician (Cardiology)  Inpatient Treatment Team: Treatment Team: Attending Provider: Adin Hector, MD; Registered Nurse: Claretta Fraise, RN; Registered Nurse: Celedonio Savage, RN; Technician: Leda Quail, NT   Subjective:  Jama Flavors in hallways Pain controlled  Objective:  Vital signs:  Filed Vitals:   11/15/13 1856 11/15/13 2132 11/16/13 0216 11/16/13 0518  BP: 101/49 101/56 116/65 118/62  Pulse: 67 73 73 76  Temp: 98.5 F (36.9 C) 98.7 F (37.1 C) 98.5 F (36.9 C) 98.8 F (37.1 C)  TempSrc: Oral Oral Oral Oral  Resp: _0 Height:      Weight:    146 lb 1.6 oz (66.271 kg)  SpO2: 100% 100% 99% 100%    Last BM Date: 11/15/13  Intake/Output   Yesterday:  05/28 0701 - 05/29 0700 In: 2968.3 [I.V.:2968.3] Out: 975 [Urine:875; Blood:100] This shift:     Bowel function:  Flatus: n  BM: n  Drain: n/a  Physical Exam:  General: Pt awake/alert/oriented x4 in no acute distress Eyes: PERRL, normal EOM.  Sclera clear.  No icterus Neuro: CN II-XII intact w/o focal sensory/motor deficits. Lymph: No head/neck/groin lymphadenopathy Psych:  No delerium/psychosis/paranoia HENT: Normocephalic, Mucus membranes moist.  No thrush Neck: Supple, No tracheal deviation Chest: No chest wall pain w good excursion CV:  Pulses intact.  Regular rhythm MS: Normal AROM mjr joints.  No obvious deformity Abdomen: Soft.  Nondistended.   Mildly tender at incisions only.  No evidence of peritonitis.  No incarcerated hernias. Ext:  SCDs BLE.  No mjr edema.  No cyanosis Skin: No petechiae / purpura   Problem List:   Principal Problem:   Diverticulitis of colon - recurrent   Assessment  Carmen Green  64 y.o. female  1 Day Post-Op  Procedure(s): ROBOT ASSISTED LAPAROSCOPIC  LOWE ANTERIOR RESECTION, LEFT SALPINGO OOPHERECTOMY, OMENPEXY RIGID PROCTOSCOPY LAPAROSCOPIC LYSIS OF ADHESIONS  Recovering  Plan:  -liquids, adv diet per protocol -f/u path -anxiolysis -follow HTN w lower lisinopril dose.  PRN backup -VTE prophylaxis- SCDs, etc -mobilize as tolerated to help recovery  I updated the patient's status to the patient.  Recommendations were made.  Questions were answered.  The patient expressed understanding & appreciation.   Adin Hector, M.D., F.A.C.S. Gastrointestinal and Minimally Invasive Surgery [facility] [address], [place], [place] [address] [phone] Main / Paging   11/16/2013   Results:   Labs: Results for orders placed during the hospital encounter of 11/15/13 (from the past 48 hour(s))  BASIC METABOLIC PANEL     Status: Abnormal   Collection Time    11/16/13  4:35 AM      Result Value Ref Range   Sodium 133 (*) 137 - 147 mEq/L   Potassium 4.6  3.7 - 5.3 mEq/L   Chloride 99  96 - 112 mEq/L   CO2 23  19 - 32 mEq/L   Glucose, Bld 116 (*) 70 - 99 mg/dL   BUN 12  6 - 23 mg/dL   Creatinine, Ser 0.90  0.50 - 1.10 mg/dL   Calcium 8.3 (*) 8.4 - 10.5 mg/dL   GFR calc non Af Amer 66 (*) >90 mL/min   GFR calc Af Amer 76 (*) >90 mL/min   Comment: (NOTE)     The eGFR has been calculated using the CKD EPI equation.     This calculation has not been validated in all clinical situations.     eGFR's persistently <90 mL/min signify possible Chronic Kidney     Disease.  CBC     Status: Abnormal   Collection Time    11/16/13  4:35 AM      Result  Value Ref Range   WBC 13.9 (*) 4.0 - 10.5 K/uL   RBC 3.07 (*) 3.87 - 5.11 MIL/uL   Hemoglobin 9.7 (*) 12.0 - 15.0 g/dL   HCT 29.2 (*) 36.0 - 46.0 %   MCV 95.1  78.0 - 100.0 fL   MCH 31.6  26.0 - 34.0 pg   MCHC 33.2  30.0 - 36.0 g/dL   RDW 12.6  11.5 - 15.5 %   Platelets 197  150 - 400 K/uL  MAGNESIUM     Status: None   Collection Time    11/16/13  4:35 AM      Result Value Ref Range   Magnesium 1.9  1.5 - 2.5 mg/dL    Imaging / Studies: No results found.  Medications / Allergies: per chart  Antibiotics: Anti-infectives   Start     Dose/Rate Route Frequency Ordered Stop   11/15/13 1400  clindamycin (CLEOCIN) IVPB 600 mg     600 mg 100 mL/hr over 30 Minutes Intravenous 4 times per day 11/15/13 1331 11/16/13 0051   11/15/13 0839  clindamycin (CLEOCIN) 900 mg, gentamicin (GARAMYCIN) 240 mg in sodium chloride 0.9 % 1,000 mL for intraperitoneal lavage  Status:  Discontinued       As needed 11/15/13 0840 11/15/13 1155   11/15/13 0600  clindamycin (CLEOCIN) IVPB 900 mg    Comments:  Pharmacy may adjust dosing strength, interval, or rate of medication as needed for optimal therapy for the patient Send with patient on call to the OR.  Anesthesia to complete antibiotic administration <75mn [facility]   900 mg 100 mL/hr over 30 Minutes Intravenous On call to O.R. 11/15/13 0924405/28/15 0752   11/15/13 0530  clindamycin (CLEOCIN) 900 mg, gentamicin (GARAMYCIN) 240 mg in sodium chloride 0.9 % 1,000 mL for intraperitoneal lavage  Status:  Discontinued    Comments:  Pharmacy may adjust dosing strength, schedule, rate of infusion, etc as needed to optimize therapy    Intraperitoneal To Surgery 11/15/13 0520 11/15/13 1323   11/15/13 0000  gentamicin (GARAMYCIN) 320 mg in dextrose 5 % 100 mL IVPB     320 mg 108 mL/hr over 60 Minutes Intravenous On call 11/14/13 1639 11/15/13 0815       Note: This dictation was prepared with Dragon/digital dictation along with  Smartphrase technology. Any transcriptional errors that result from this process are unintentional.

## 2013-11-17 MED ORDER — SODIUM CHLORIDE 0.9 % IJ SOLN
3.0000 mL | Freq: Two times a day (BID) | INTRAMUSCULAR | Status: DC
  Administered 2013-11-17 – 2013-11-18 (×2): 3 mL via INTRAVENOUS

## 2013-11-17 MED ORDER — LACTATED RINGERS IV BOLUS (SEPSIS): 1000.0000 mL | Freq: Three times a day (TID) | INTRAVENOUS | Status: AC | PRN

## 2013-11-17 MED ORDER — LISINOPRIL 10 MG PO TABS
10.0000 mg | ORAL_TABLET | Freq: Every day | ORAL | Status: DC
  Administered 2013-11-17: 10 mg via ORAL
  Filled 2013-11-17 (×2): qty 1

## 2013-11-17 MED ORDER — OXYCODONE-ACETAMINOPHEN 5-325 MG PO TABS
1.0000 | ORAL_TABLET | ORAL | Status: DC | PRN
  Administered 2013-11-17 (×3): 1 via ORAL
  Filled 2013-11-17 (×3): qty 1

## 2013-11-17 MED ORDER — ACETAMINOPHEN 500 MG PO TABS: 1000.0000 mg | ORAL_TABLET | Freq: Four times a day (QID) | ORAL | Status: DC | PRN

## 2013-11-17 MED ORDER — SODIUM CHLORIDE 0.9 % IJ SOLN: 3.0000 mL | INTRAMUSCULAR | Status: DC | PRN

## 2013-11-17 NOTE — Progress Notes (Signed)
Corazon, MD, [address] [place], [place] [address] Phone: [phone] FAX: [phone]    Carmen Green [id number] [birth year]  CARE TEAM:  PCP: Carmen Graves, DO  Outpatient Care Team: Patient Care Team: Carmen Graves, DO as PCP - General Carmen Dilling, RN as Registered Nurse Carmen Dragon, MD as Consulting Physician (Gastroenterology) Carmen Hector, MD as Consulting Physician (General Surgery) Carmen Dresser, MD as Consulting Physician (Cardiology)  Inpatient Treatment Team: Treatment Team: Attending Provider: Adin Hector, MD; Technician: Carmen Green, NT   Subjective:  [facility] in hallways more Pain controlled Tolerates liquids  Objective:  Vital signs:  Filed Vitals:   11/16/13 1400 11/16/13 2142 11/17/13 0605 11/17/13 0710  BP: 129/69 155/72 141/74   Pulse: 73 76 76   Temp: 99.3 F (37.4 C) 98.1 F (36.7 C) 99.2 F (37.3 C)   TempSrc: Oral Oral Oral   Resp: '14 16 18   ' Height:      Weight:    143 lb 11.8 oz (65.2 kg)  SpO2: 99% 97% 94%     Last BM Date: 11/15/13  Intake/Output   Yesterday:  05/29 0701 - 05/30 0700 In: 1734.2 [P.O.:600; I.V.:1134.2] Out: 5350 [Urine:5350] This shift:     Bowel function:  Flatus: n  BM: n  Drain: n/a  Physical Exam:  General: Pt awake/alert/oriented x4 in no acute distress Eyes: PERRL, normal EOM.  Sclera clear.  No icterus Neuro: CN II-XII intact w/o focal sensory/motor deficits. Lymph: No head/neck/groin lymphadenopathy Psych:  No delerium/psychosis/paranoia HENT: Normocephalic, Mucus membranes moist.  No thrush Neck: Supple, No tracheal deviation Chest: No chest wall pain w good excursion CV:  Pulses intact.  Regular rhythm MS: Normal AROM mjr joints.  No obvious deformity Abdomen: Soft.  Nondistended.  Mildly tender at incisions only.  No evidence of peritonitis.  No incarcerated hernias. Ext:  SCDs  BLE.  No mjr edema.  No cyanosis Skin: No petechiae / purpura   Problem List:   Principal Problem:   Diverticulitis of colon - recurrent Green/p robotic LAR 11/15/2013 Active Problems:   ANXIETY   HYPERTENSION   CARDIOMYOPATHY   GERD   Ovarian abscess due to diverticulitis Green/p LSO 11/15/2013   Assessment  Carmen Green  63 y.o. female  2 Days Post-Op  Procedure(Green): ROBOT ASSISTED LAPAROSCOPIC LOW ANTERIOR RESECTION, LEFT SALPINGO OOPHERECTOMY, OMENTOPEXY RIGID PROCTOSCOPY LAPAROSCOPIC LYSIS OF ADHESIONS  Diagnosis 1. Colon, segmental resection, with left fallopian tube and ovary - PERFORATED DIVERTICULUM WITH ASSOCIATED OVARIAN ABSCESS. - DIVERTICULOSIS. - FIBROUS ADHESIONS. - HYDROSALPINX. - NO DYSPLASIA OR MALIGNANCY IDENTIFIED. 2. Colon, segmental resection, additional rectosigmoid - DIVERTICULITIS. - MARGIN IS VIABLE WITHOUT ACUTE INFLAMMATION. - NO DYSPLASIA OR MALIGNANCY. 3. Colon, resection margin (donut) - ACUTE INFLAMMATION AND ISCHEMIC CHANGE. - NO DYSPLASIA OR MALIGNANCY. 4. Colon, resection margin (donut) - FOCAL ISCHEMIC CHANGE. - NO DYSPLASIA OR MALIGNANCY. Carmen Males MD Pathologist, Electronic Signature (Case signed 11/16/2013) Specimen Carmen Green and Clinical Information Specimen(Green) Obtained: 1. Colon, segmental resection, with left fallopian tube and ovary 2. Colon, segmental resection, additional rectosigmoid 3. Colon, resection margin (donut) 4. Colon, resection margin (donut) Specimen Clinical Information 1. Recurrent sigmoid diverticulitis (je) Carmen Green 1. Received in formalin is an 18 cm segment of colon, clinically rectosigmoid colon. One end is patent and is clinically noted to represent the proximal margin. The distal margin is stapled. There are adhesions present 1 of 2 FINAL for Carmen [facility]  Carmen Green ([id number]) Carmen Green(continued) on the serosal surface. There is a 4.2 x 3.2 x 3.2 cm adherent ovoid structure, which grossly appears to represent an  ovary. The cut surface shows a 2.3 cm abscess containing tan-yellow purulent material. The mucosal surface of the colon is glistening, tan and there are multiple diverticula present. There is a communication between the base of one diverticulum and the adherent ovary at the previously described abscess. The fallopian tube is not distinctly identified, however, there is a 1 cm cystic space possibly representing a dilated tube. The mucosa at the margins of resection grossly appears viable. Sections are submitted in four cassettes. A = section of diverticulum involving the ovarian abscess. B = section of uninvolved diverticulum. C = ovary. D = sections of structures possibly representing fallopian tube. 2. Received in formalin is a 7 cm segment of colon, clinically additional rectosigmoid colon. The margins are stapled and there is a suture attached at one margin clinically identifying it as the new distal margin. The serosa is smooth, tan to hyperemic. The mucosa is glistening, tan. There are a few diverticula present. Sections are submitted in two cassettes. A = sections from new distal margin. B = diverticulum. 3. Received in formalin is an anastomotic ring clinically identified as proximal. There is a blue suture attached. The ring measures 2 cm in diameter x 0.7 cm in length. The mucosa is glistening and tan. Sections are submitted in one cassette. 4. Received in formalin is an anastomotic ring clinically identified as new distal margin. The ring measures 2 cm in diameter x 1.5 cm in length. The mucosa is glistening and tan. Sections are submitted in one cassette. (GRP:ecj 11/15/2013) Report signed out from the following location(Green) Technical Component and Interpretation performed at [place], [place], [place] [address]. CLIA #: [id number], 2 of  Recovering  Plan:  -FULL liquids, adv diet per protocol -path benign - D/W pt -anxiolysis -HTN - inc  lisinopril dose.  PRN backup -VTE prophylaxis- SCDs, etc -mobilize as tolerated to help recovery  I updated the patient'Green status to the patient.  Recommendations were made.  Questions were answered.  The patient expressed understanding & appreciation.   Carmen Green, M.D., F.A.C.Green. Gastrointestinal and Minimally Invasive Surgery [facility] [address] [place],  [address] [phone] Main / Paging   11/17/2013   Results:   Labs: Results for orders placed during the hospital encounter of 11/15/13 (from the past 48 hour(Green))  BASIC METABOLIC PANEL     Status: Abnormal   Collection Time    11/16/13  4:35 AM      Result Value Ref Range   Sodium 133 (*) 137 - 147 mEq/L   Potassium 4.6  3.7 - 5.3 mEq/L   Chloride 99  96 - 112 mEq/L   CO2 23  19 - 32 mEq/L   Glucose, Bld 116 (*) 70 - 99 mg/dL   BUN 12  6 - 23 mg/dL   Creatinine, Ser 0.90  0.50 - 1.10 mg/dL   Calcium 8.3 (*) 8.4 - 10.5 mg/dL   GFR calc non Af Amer 66 (*) >90 mL/min   GFR calc Af Amer 76 (*) >90 mL/min   Comment: (NOTE)     The eGFR has been calculated using the CKD EPI equation.     This calculation has not been validated in all clinical situations.     eGFR'Green persistently <90 mL/min signify possible Chronic Kidney  Disease.  CBC     Status: Abnormal   Collection Time    11/16/13  4:35 AM      Result Value Ref Range   WBC 13.9 (*) 4.0 - 10.5 K/uL   RBC 3.07 (*) 3.87 - 5.11 MIL/uL   Hemoglobin 9.7 (*) 12.0 - 15.0 g/dL   HCT 29.2 (*) 36.0 - 46.0 %   MCV 95.1  78.0 - 100.0 fL   MCH 31.6  26.0 - 34.0 pg   MCHC 33.2  30.0 - 36.0 g/dL   RDW 12.6  11.5 - 15.5 %   Platelets 197  150 - 400 K/uL  MAGNESIUM     Status: None   Collection Time    11/16/13  4:35 AM      Result Value Ref Range   Magnesium 1.9  1.5 - 2.5 mg/dL    Imaging / Studies: No results found.  Medications / Allergies: per chart  Antibiotics: Anti-infectives   Start     Dose/Rate  Route Frequency Ordered Stop   11/15/13 1400  clindamycin (CLEOCIN) IVPB 600 mg     600 mg 100 mL/hr over 30 Minutes Intravenous 4 times per day 11/15/13 1331 11/16/13 0051   11/15/13 0839  clindamycin (CLEOCIN) 900 mg, gentamicin (GARAMYCIN) 240 mg in sodium chloride 0.9 % 1,000 mL for intraperitoneal lavage  Status:  Discontinued       As needed 11/15/13 0840 11/15/13 1155   11/15/13 0600  clindamycin (CLEOCIN) IVPB 900 mg    Comments:  Pharmacy may adjust dosing strength, interval, or rate of medication as needed for optimal therapy for the patient Send with patient on call to the OR.  Anesthesia to complete antibiotic administration <72mn prior to [facility]   900 mg 100 mL/hr over 30 Minutes Intravenous On call to O.R. 11/15/13 0202305/28/15 0752   11/15/13 0530  clindamycin (CLEOCIN) 900 mg, gentamicin (GARAMYCIN) 240 mg in sodium chloride 0.9 % 1,000 mL for intraperitoneal lavage  Status:  Discontinued    Comments:  Pharmacy may adjust dosing strength, schedule, rate of infusion, etc as needed to optimize therapy    Intraperitoneal To Surgery 11/15/13 0520 11/15/13 1323   11/15/13 0000  gentamicin (GARAMYCIN) 320 mg in dextrose 5 % 100 mL IVPB     320 mg 108 mL/hr over 60 Minutes Intravenous On call 11/14/13 1639 11/15/13 0815       Note: This dictation was prepared with Green/digital dictation along with Smartphrase technology. Any transcriptional errors that result from this process are unintentional.

## 2013-11-18 MED ORDER — ACETAMINOPHEN 500 MG PO TABS
1000.0000 mg | ORAL_TABLET | Freq: Three times a day (TID) | ORAL | Status: DC
  Administered 2013-11-18 (×3): 1000 mg via ORAL
  Filled 2013-11-18 (×6): qty 2

## 2013-11-18 MED ORDER — HYDROMORPHONE HCL PF 1 MG/ML IJ SOLN: 0.5000 mg | INTRAMUSCULAR | Status: DC | PRN

## 2013-11-18 MED ORDER — OXYCODONE HCL 5 MG PO TABS
5.0000 mg | ORAL_TABLET | ORAL | Status: DC | PRN
  Administered 2013-11-18 (×3): 5 mg via ORAL
  Filled 2013-11-18 (×3): qty 1

## 2013-11-18 MED ORDER — LISINOPRIL 20 MG PO TABS
20.0000 mg | ORAL_TABLET | Freq: Every day | ORAL | Status: DC
  Administered 2013-11-18: 20 mg via ORAL
  Filled 2013-11-18 (×2): qty 1

## 2013-11-18 NOTE — Progress Notes (Signed)
Cypress, MD, [address] [place], [place] [address] Phone: [phone] FAX: [place] [phone] 02-24-1951  CARE TEAM:  PCP: Octavio Graves, DO  Outpatient Care Team: Patient Care Team: Octavio Graves, DO as PCP - General Altamese Dilling, RN as Registered Nurse Lafayette Dragon, MD as Consulting Physician (Gastroenterology) Adin Hector, MD as Consulting Physician (General Surgery) Larey Dresser, MD as Consulting Physician (Cardiology)  Inpatient Treatment Team: Treatment Team: Attending Provider: Adin Hector, MD; Technician: Leda Quail, NT; Registered Nurse: Gwen Pounds, RN; Technician: Zoe Lan Flinchum-Land, NT   Subjective:  Walking in hallways  Felt bloated yesterday but much better now Pain controlled Tolerating full liquids - wanting solids RNs in room  Objective:  Vital signs:  Filed Vitals:   11/17/13 1019 11/17/13 1307 11/17/13 2156 11/18/13 0645  BP: 144/77 138/71 151/73 146/81  Pulse: 76 68 83 76  Temp: 99 F (37.2 C) 99.2 F (37.3 C) 98 F (36.7 C) 99.2 F (37.3 C)  TempSrc: Oral Oral Oral Oral  Resp: 18 18 18 20   Height:      Weight:    146 lb 9.7 oz (66.5 kg)  SpO2: 97% 99% 99% 98%    Last BM Date: 11/15/13  Intake/Output   Yesterday:  05/30 0701 - 05/31 0700 In: 799.7 [I.V.:799.7] Out: 2875 [Urine:2875] This shift:     Bowel function:  Flatus: n  BM: yes - x2 w some old blood  Drain: n/a  Physical Exam:  General: Pt awake/alert/oriented x4 in no acute distress Eyes: PERRL, normal EOM.  Sclera clear.  No icterus Neuro: CN II-XII intact w/o focal sensory/motor deficits. Lymph: No head/neck/groin lymphadenopathy Psych:  No delerium/psychosis/paranoia HENT: Normocephalic, Mucus membranes moist.  No thrush Neck: Supple, No tracheal deviation Chest: No chest wall pain w good excursion CV:  Pulses intact.   Regular rhythm MS: Normal AROM mjr joints.  No obvious deformity Abdomen: Soft.  Nondistended.  OnQ collapsed - dressings removed.  Min tender at incisions only.  No evidence of peritonitis.  No incarcerated hernias. Ext:  SCDs BLE.  No mjr edema.  No cyanosis Skin: No petechiae / purpura   Problem List:   Principal Problem:   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 11/15/2013 Active Problems:   ANXIETY   HYPERTENSION   CARDIOMYOPATHY   GERD   Ovarian abscess due to diverticulitis s/p LSO 11/15/2013   Assessment  Carmen Green  63 y.o. female  3 Days Post-Op  Procedure(s): ROBOT ASSISTED LAPAROSCOPIC LOW ANTERIOR RESECTION, LEFT SALPINGO OOPHERECTOMY, OMENTOPEXY RIGID PROCTOSCOPY LAPAROSCOPIC LYSIS OF ADHESIONS  Diagnosis 1. Colon, segmental resection, with left fallopian tube and ovary - PERFORATED DIVERTICULUM WITH ASSOCIATED OVARIAN ABSCESS. - DIVERTICULOSIS. - FIBROUS ADHESIONS. - HYDROSALPINX. - NO DYSPLASIA OR MALIGNANCY IDENTIFIED. 2. Colon, segmental resection, additional rectosigmoid - DIVERTICULITIS. - MARGIN IS VIABLE WITHOUT ACUTE INFLAMMATION. - NO DYSPLASIA OR MALIGNANCY. 3. Colon, resection margin (donut) - ACUTE INFLAMMATION AND ISCHEMIC CHANGE. - NO DYSPLASIA OR MALIGNANCY. 4. Colon, resection margin (donut) - FOCAL ISCHEMIC CHANGE. - NO DYSPLASIA OR MALIGNANCY. [name] MD Pathologist, Electronic Signature (Case signed 11/16/2013) Specimen Chanz Cahall and Clinical Information Specimen(s) Obtained: 1. Colon, segmental resection, with left fallopian tube and ovary 2. Colon, segmental resection, additional rectosigmoid 3. Colon, resection margin (donut) 4. Colon, resection margin (donut) Specimen Clinical Information 1. Recurrent sigmoid diverticulitis (je) Capitola Ladson 1. Received in  formalin is an 18 cm segment of colon, clinically rectosigmoid colon. One end is patent and is clinically noted to represent the proximal margin. The distal  margin is stapled. There are adhesions present 1 of 2 FINAL for LOISANN, ROACH ([id number]) Saahil Herbster(continued) on the serosal surface. There is a 4.2 x 3.2 x 3.2 cm adherent ovoid structure, which grossly appears to represent an ovary. The cut surface shows a 2.3 cm abscess containing tan-yellow purulent material. The mucosal surface of the colon is glistening, tan and there are multiple diverticula present. There is a communication between the base of one diverticulum and the adherent ovary at the previously described abscess. The fallopian tube is not distinctly identified, however, there is a 1 cm cystic space possibly representing a dilated tube. The mucosa at the margins of resection grossly appears viable. Sections are submitted in four cassettes. A = section of diverticulum involving the ovarian abscess. B = section of uninvolved diverticulum. C = ovary. D = sections of structures possibly representing fallopian tube. 2. Received in formalin is a 7 cm segment of colon, clinically additional rectosigmoid colon. The margins are stapled and there is a suture attached at one margin clinically identifying it as the new distal margin. The serosa is smooth, tan to hyperemic. The mucosa is glistening, tan. There are a few diverticula present. Sections are submitted in two cassettes. A = sections from new distal margin. B = diverticulum. 3. Received in formalin is an anastomotic ring clinically identified as proximal. There is a blue suture attached. The ring measures 2 cm in diameter x 0.7 cm in length. The mucosa is glistening and tan. Sections are submitted in one cassette. 4. Received in formalin is an anastomotic ring clinically identified as new distal margin. The ring measures 2 cm in diameter x 1.5 cm in length. The mucosa is glistening and tan. Sections are submitted in one cassette. (GRP:ecj 11/15/2013) Report signed out from the following location(s) Technical Component and  Interpretation performed at [place], [place], [place] [address]. CLIA #: [id number], 2 of  Recovering  Plan:  -solid diet, adv diet per protocol -path benign - D/W pt -anxiolysis -HTN - inc lisinopril dose.  PRN backup -VTE prophylaxis- SCDs, etc -mobilize as tolerated to help recovery  D/C patient from hospital when patient meets criteria (anticipate in 0-1 day(s)):  Tolerating oral intake well Ambulating in walkways Adequate pain control without IV medications Urinating  Having flatus/BMs  I updated the patient's status to the patient.  Recommendations were made.  Questions were answered.  The patient expressed understanding & appreciation.   Adin Hector, M.D., F.A.C.S. Gastrointestinal and Minimally Invasive Surgery [facility] [address] [place], [place] [address] [phone] Main / Paging   11/18/2013   Results:   Labs: No results found for this or any previous visit (from the past 48 hour(s)).  Imaging / Studies: No results found.  Medications / Allergies: per chart  Antibiotics: Anti-infectives   Start     Dose/Rate Route Frequency Ordered Stop   11/15/13 1400  clindamycin (CLEOCIN) IVPB 600 mg     600 mg 100 mL/hr over 30 Minutes Intravenous 4 times per day 11/15/13 1331 11/16/13 0051   11/15/13 0839  clindamycin (CLEOCIN) 900 mg, gentamicin (GARAMYCIN) 240 mg in sodium chloride 0.9 % 1,000 mL for intraperitoneal lavage  Status:  Discontinued       As needed 11/15/13 0840 11/15/13 1155   11/15/13  0600  clindamycin (CLEOCIN) IVPB 900 mg    Comments:  Pharmacy may adjust dosing strength, interval, or rate of medication as needed for optimal therapy for the patient Send with patient on call to the OR.  Anesthesia to complete antibiotic administration <22min prior to incision per [facility].   900 mg 100 mL/hr over 30 Minutes Intravenous On call to O.R. 11/15/13 7035  11/15/13 0752   11/15/13 0530  clindamycin (CLEOCIN) 900 mg, gentamicin (GARAMYCIN) 240 mg in sodium chloride 0.9 % 1,000 mL for intraperitoneal lavage  Status:  Discontinued    Comments:  Pharmacy may adjust dosing strength, schedule, rate of infusion, etc as needed to optimize therapy    Intraperitoneal To Surgery 11/15/13 0520 11/15/13 1323   11/15/13 0000  gentamicin (GARAMYCIN) 320 mg in dextrose 5 % 100 mL IVPB     320 mg 108 mL/hr over 60 Minutes Intravenous On call 11/14/13 1639 11/15/13 0815       Note: This dictation was prepared with Dragon/digital dictation along with Smartphrase technology. Any transcriptional errors that result from this process are unintentional.

## 2013-11-18 NOTE — Progress Notes (Signed)
Pt able to tolerate lunch without nausea. Urinating without difficulty, walking hall without difficulty and pain controlled with oral meds. Patient denies flatus and /bm and feels is not ready for  discharge.

## 2013-11-19 NOTE — Discharge Summary (Signed)
Physician Discharge Summary  Patient ID: Carmen Green MRN: [id number] DOB/AGE: [birth year] 63 y.o.  Admit date: 11/15/2013 Discharge date: 11/19/2013  Admission Diagnoses:  Discharge Diagnoses:  Principal Problem:   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 11/15/2013 Active Problems:   ANXIETY   HYPERTENSION   CARDIOMYOPATHY   GERD   Ovarian abscess due to diverticulitis s/p LSO 11/15/2013   Discharged Condition: good  Hospital Course: Pt s/p robotic LOA & LAR/LSO for diverticulitis w ovarian abscess.  Postoperatively, the patient mobilized in the hallways and advanced to a solid diet gradually.  Pain was well-controlled and transitioned off IV medications.    By the time of discharge, the patient was walking well the hallways, eating food well, having flatus.  Pain was-controlled on an oral regimen.  Based on meeting DC criteria and recovering well, I felt it was safe for the patient to be discharged home with close followup.  Instructions were discussed in detail.  They are written as well.   Consults: None  Significant Diagnostic Studies:   No results found for this or any previous visit (from the past 72 hour(s)).  Treatments:    ROBOT ASSISTED LAPAROSCOPIC LOW ANTERIOR RESECTION, LEFT SALPINGO OOPHERECTOMY, OMENTOPEXY  RIGID PROCTOSCOPY  LAPAROSCOPIC LYSIS OF ADHESIONS  Diagnosis  1. Colon, segmental resection, with left fallopian tube and ovary  - PERFORATED DIVERTICULUM WITH ASSOCIATED OVARIAN ABSCESS.  - DIVERTICULOSIS.  - FIBROUS ADHESIONS.  - HYDROSALPINX.  - NO DYSPLASIA OR MALIGNANCY IDENTIFIED.  2. Colon, segmental resection, additional rectosigmoid  - DIVERTICULITIS.  - MARGIN IS VIABLE WITHOUT ACUTE INFLAMMATION.  - NO DYSPLASIA OR MALIGNANCY.  3. Colon, resection margin (donut)  - ACUTE INFLAMMATION AND ISCHEMIC CHANGE.  - NO DYSPLASIA OR MALIGNANCY.  4. Colon, resection margin (donut)  - FOCAL ISCHEMIC CHANGE.  - NO DYSPLASIA OR  MALIGNANCY.  [name] MD  Pathologist, Electronic Signature  (Case signed 11/16/2013)  Specimen Diaz Crago and Clinical Information  Specimen(s) Obtained:  1. Colon, segmental resection, with left fallopian tube and ovary  2. Colon, segmental resection, additional rectosigmoid  3. Colon, resection margin (donut)  4. Colon, resection margin (donut)  Specimen Clinical Information  1. Recurrent sigmoid diverticulitis (je)  Blia Totman  1. Received in formalin is an 18 cm segment of colon, clinically rectosigmoid colon. One end is patent and is  clinically noted to represent the proximal margin. The distal margin is stapled. There are adhesions present  1 of 2  FINAL for JYOTI, ZUPON [id number])  Kasha Howeth(continued)  on the serosal surface. There is a 4.2 x 3.2 x 3.2 cm adherent ovoid structure, which grossly appears to  represent an ovary. The cut surface shows a 2.3 cm abscess containing tan-yellow purulent material. The  mucosal surface of the colon is glistening, tan and there are multiple diverticula present. There is a  communication between the base of one diverticulum and the adherent ovary at the previously described  abscess. The fallopian tube is not distinctly identified, however, there is a 1 cm cystic space possibly  representing a dilated tube. The mucosa at the margins of resection grossly appears viable. Sections are  submitted in four cassettes.  A = section of diverticulum involving the ovarian abscess.  B = section of uninvolved diverticulum.  C = ovary.  D = sections of structures possibly representing fallopian tube.  2. Received in formalin is a 7 cm segment of colon, clinically additional rectosigmoid colon. The margins are  stapled and there is a  suture attached at one margin clinically identifying it as the new distal margin. The  serosa is smooth, tan to hyperemic. The mucosa is glistening, tan. There are a few diverticula present.  Sections are submitted in two  cassettes.  A = sections from new distal margin.  B = diverticulum.  3. Received in formalin is an anastomotic ring clinically identified as proximal. There is a blue suture  attached. The ring measures 2 cm in diameter x 0.7 cm in length. The mucosa is glistening and tan.  Sections are submitted in one cassette.  4. Received in formalin is an anastomotic ring clinically identified as new distal margin. The ring measures 2  cm in diameter x 1.5 cm in length. The mucosa is glistening and tan. Sections are submitted in one  cassette. (GRP:ecj 11/15/2013)  Report signed out from the following location(s)  Technical Component and Interpretation performed at [facility], [place], [place] [address]. CLIA #: [id number],  Discharge Exam: Blood pressure 133/73, pulse 73, temperature 98.5 F (36.9 C), temperature source Oral, resp. rate 18, height 5\' 7"  (1.702 m), weight 138 lb 3.7 oz (62.7 kg), SpO2 97.00%.  General: Pt awake/alert/oriented x4 in no acute distress .  Smiling alert Eyes: PERRL, normal EOM. Sclera clear. No icterus  Neuro: CN II-XII intact w/o focal sensory/motor deficits.  Lymph: No head/neck/groin lymphadenopathy  Psych: No delerium/psychosis/paranoia  HENT: Normocephalic, Mucus membranes moist. No thrush  Neck: Supple, No tracheal deviation  Chest: No chest wall pain w good excursion  CV: Pulses intact. Regular rhythm  MS: Normal AROM mjr joints. No obvious deformity  Abdomen: Soft. Nondistended. Incisions closed healing well. No evidence of peritonitis. No hernias.  Ext: SCDs BLE. No mjr edema. No cyanosis  Skin: No petechiae / purpura   Disposition: 01-Home or Self Care  Discharge Instructions   Call MD for:  extreme fatigue    Complete by:  As directed      Call MD for:  extreme fatigue    Complete by:  As directed      Call MD for:  hives    Complete by:  As directed      Call MD for:  hives    Complete by:  As directed      Call  MD for:  persistant nausea and vomiting    Complete by:  As directed      Call MD for:  persistant nausea and vomiting    Complete by:  As directed      Call MD for:  redness, tenderness, or signs of infection (pain, swelling, redness, odor or green/yellow discharge around incision site)    Complete by:  As directed      Call MD for:  redness, tenderness, or signs of infection (pain, swelling, redness, odor or green/yellow discharge around incision site)    Complete by:  As directed      Call MD for:  severe uncontrolled pain    Complete by:  As directed      Call MD for:  severe uncontrolled pain    Complete by:  As directed      Call MD for:    Complete by:  As directed   Temperature > 101.50F     Call MD for:    Complete by:  As directed   Temperature > 101.50F     Diet - low sodium heart healthy    Complete by:  As directed  Discharge instructions    Complete by:  As directed   Please see discharge instruction sheets.  Also refer to handout given an office.  Please call our office if you have any questions or concerns [phone]     Discharge instructions    Complete by:  As directed   Please see discharge instruction sheets.  Also refer to handout given an office.  Please call our office if you have any questions or concerns [phone]     Discharge wound care:    Complete by:  As directed   If you have closed incisions, shower and bathe over these incisions with soap and water every day.  Remove all surgical dressings on postoperative day #3.  You do not need to replace dressings over the closed incisions unless you feel more comfortable with a Band-Aid covering it.   If you have an open wound that requires packing, please see wound care instructions.  In general, remove all dressings, wash wound with soap and water and then replace with saline moistened gauze.  Do the dressing change at least every day.  Please call our office [phone] if you have further questions.      Discharge wound care:    Complete by:  As directed   If you have closed incisions, shower and bathe over these incisions with soap and water every day.  Remove all surgical dressings on postoperative day #3.  You do not need to replace dressings over the closed incisions unless you feel more comfortable with a Band-Aid covering it.   If you have an open wound that requires packing, please see wound care instructions.  In general, remove all dressings, wash wound with soap and water and then replace with saline moistened gauze.  Do the dressing change at least every day.  Please call our office [phone] if you have further questions.     Driving Restrictions    Complete by:  As directed   No driving until off narcotics and can safely swerve away without pain during an emergency     Driving Restrictions    Complete by:  As directed   No driving until off narcotics and can safely swerve away without pain during an emergency     Increase activity slowly    Complete by:  As directed   Walk an hour a day.  Use 20-30 minute walks.  When you can walk 30 minutes without difficulty, increase to low impact/moderate activities such as biking, jogging, swimming, sexual activity..  Eventually can increase to unrestricted activity when not feeling pain.  If you feel pain: STOP!Marland Kitchen   Let pain protect you from overdoing it.  Use ice/heat/over-the-counter pain medications to help minimize his soreness.  Use pain prescriptions as needed to remain active.  It is better to take extra pain medications and be more active than to stay bedridden to avoid all pain medications.     Increase activity slowly    Complete by:  As directed   Walk an hour a day.  Use 20-30 minute walks.  When you can walk 30 minutes without difficulty, increase to low impact/moderate activities such as biking, jogging, swimming, sexual activity..  Eventually can increase to unrestricted activity when not feeling pain.  If you feel pain:  STOP!Marland Kitchen   Let pain protect you from overdoing it.  Use ice/heat/over-the-counter pain medications to help minimize his soreness.  Use pain prescriptions as needed to remain active.  It is better to take  extra pain medications and be more active than to stay bedridden to avoid all pain medications.     Lifting restrictions    Complete by:  As directed   Avoid heavy lifting initially.  Do not push through pain.  You have no specific weight limit.  Coughing and sneezing or four more stressful to your incision than any lifting you will do. Pain will protect you from injury.  Therefore, avoid intense activity until off all narcotic pain medications.  Coughing and sneezing or four more stressful to your incision than any lifting he will do.     Lifting restrictions    Complete by:  As directed   Avoid heavy lifting initially.  Do not push through pain.  You have no specific weight limit.  Coughing and sneezing or four more stressful to your incision than any lifting you will do. Pain will protect you from injury.  Therefore, avoid intense activity until off all narcotic pain medications.  Coughing and sneezing or four more stressful to your incision than any lifting he will do.     May shower / Bathe    Complete by:  As directed      May shower / Bathe    Complete by:  As directed      May walk up steps    Complete by:  As directed      May walk up steps    Complete by:  As directed      Sexual Activity Restrictions    Complete by:  As directed   Sexual activity as tolerated.  Do not push through pain.  Pain will protect you from injury.     Sexual Activity Restrictions    Complete by:  As directed   Sexual activity as tolerated.  Do not push through pain.  Pain will protect you from injury.     Walk with assistance    Complete by:  As directed   Walk over an hour a day.  May use a walker/cane/companion to help with balance and stamina.     Walk with assistance    Complete by:  As directed    Walk over an hour a day.  May use a walker/cane/companion to help with balance and stamina.            Medication List         buPROPion 300 MG 24 hr tablet  Commonly known as:  WELLBUTRIN XL  Take 300 mg by mouth every morning.     busPIRone 10 MG tablet  Commonly known as:  BUSPAR  Take 10 mg by mouth 3 (three) times daily.     cetirizine 10 MG tablet  Commonly known as:  ZYRTEC  Take 10 mg by mouth daily as needed for allergies.     esomeprazole 40 MG capsule  Commonly known as:  NEXIUM  Take 40 mg by mouth daily.     estrogen (conjugated)-medroxyprogesterone 0.45-1.5 MG per tablet  Commonly known as:  PREMPRO  Take 1 tablet by mouth daily.     fluticasone 50 MCG/ACT nasal spray  Commonly known as:  FLONASE  Place 1 spray into the nose daily.     guaiFENesin 600 MG 12 hr tablet  Commonly known as:  MUCINEX  Take 1,200 mg by mouth 2 (two) times daily as needed for cough.     ibuprofen 400 MG tablet  Commonly known as:  ADVIL,MOTRIN  Take 1-2 tablets (400-800 mg total) by mouth  every 6 (six) hours as needed for fever, headache, mild pain or moderate pain.     lisinopril-hydrochlorothiazide 20-12.5 MG per tablet  Commonly known as:  PRINZIDE,ZESTORETIC  Take 1 tablet by mouth every morning.     LORazepam 0.5 MG tablet  Commonly known as:  ATIVAN  Take 0.5 mg by mouth at bedtime.     multivitamin tablet  Take 1 tablet by mouth daily.     oxyCODONE 5 MG immediate release tablet  Commonly known as:  Oxy IR/ROXICODONE  Take 1-2 tablets (5-10 mg total) by mouth every 4 (four) hours as needed for moderate pain, severe pain or breakthrough pain.     rosuvastatin 10 MG tablet  Commonly known as:  CRESTOR  Take 10 mg by mouth every morning.           Follow-up Information   Follow up with Kimie Pidcock C., MD. Schedule an appointment as soon as possible for a visit in 2 weeks. (To follow up after your operation)    Specialty:  General Surgery   Contact  information:   [address] [place] [address] [phone]       Signed: Adin Hector 11/19/2013, 7:48 AM

## 2013-11-20 ENCOUNTER — Telehealth (INDEPENDENT_AMBULATORY_CARE_PROVIDER_SITE_OTHER): Payer: Self-pay

## 2013-11-20 NOTE — Telephone Encounter (Signed)
LMOM for pt to call me I just want to check on her from being discharged from the hospital.

## 2013-11-20 NOTE — Telephone Encounter (Signed)
Pt returned my call. The pt is doing well since being at home yesterday. The pt is up moving around in the home slowly. The pt is eating very small amounts but did drink a smoothie this a.m. The pt is moving her bowels ok. The pt has a f/u appt with Dr Johney Maine on 12/05/13. I advised pt if any changes, concerns, or questions to please call our office. The pt understands.

## 2013-12-05 ENCOUNTER — Encounter (INDEPENDENT_AMBULATORY_CARE_PROVIDER_SITE_OTHER): Payer: Self-pay | Admitting: Surgery

## 2013-12-05 ENCOUNTER — Ambulatory Visit (INDEPENDENT_AMBULATORY_CARE_PROVIDER_SITE_OTHER): Payer: Commercial Managed Care - PPO | Admitting: Surgery

## 2013-12-05 VITALS — BP 126/82 | HR 69 | Temp 97.9°F | Ht 61.0 in | Wt 137.0 lb

## 2013-12-05 DIAGNOSIS — K5720 Diverticulitis of large intestine with perforation and abscess without bleeding: Secondary | ICD-10-CM

## 2013-12-05 DIAGNOSIS — K5732 Diverticulitis of large intestine without perforation or abscess without bleeding: Secondary | ICD-10-CM

## 2013-12-05 NOTE — Progress Notes (Addendum)
Subjective:     Patient ID: Carmen Green, female   DOB: 12/17/1950, 63 y.o.   MRN: [id number]  HPI  Note: This dictation was prepared with Green/digital dictation along with [facility] technology. Any transcriptional errors that result from this process are unintentional.       Carmen Green  02-05-1951 [id number]  Patient Care Team: Carmen Graves, DO as PCP - General Carmen Dilling, RN as Registered Nurse Carmen Dragon, MD as Consulting Physician (Gastroenterology) Carmen Hector, MD as Consulting Physician (General Surgery) Carmen Dresser, MD as Consulting Physician (Cardiology)  Procedure (Date: 11/15/2013):  POST-OPERATIVE DIAGNOSIS: recurrent sigmoid diverticulitis  PROCEDURE: Procedure(s):  ROBOTIC LYSIS OF ADHESIONS X 60 MIN  ROBOTIC ASSISTED LAPAROSCOPIC LOW ANTERIOR RESECTION  LEFT SALPINGO OOPHORECTOMY  OMENTOPEXY  RIGID PROCTOSCOPY  SURGEON: Surgeon(s):  Carmen Hector, MD  ASSISTANT: Carmen Ruff, MD      Diagnosis  1. Colon, segmental resection, with left fallopian tube and ovary  - PERFORATED DIVERTICULUM WITH ASSOCIATED OVARIAN ABSCESS.  - DIVERTICULOSIS.  - FIBROUS ADHESIONS.  - HYDROSALPINX.  - NO DYSPLASIA OR MALIGNANCY IDENTIFIED.  2. Colon, segmental resection, additional rectosigmoid  - DIVERTICULITIS.  - MARGIN IS VIABLE WITHOUT ACUTE INFLAMMATION.  - NO DYSPLASIA OR MALIGNANCY.  3. Colon, resection margin (donut)  - ACUTE INFLAMMATION AND ISCHEMIC CHANGE.  - NO DYSPLASIA OR MALIGNANCY.  4. Colon, resection margin (donut)  - FOCAL ISCHEMIC CHANGE.  - NO DYSPLASIA OR MALIGNANCY.  Carmen Males MD  Pathologist, Electronic Signature  (Case signed 11/16/2013)  Specimen Gross and Clinical Information  Specimen(s) Obtained:  1. Colon, segmental resection, with left fallopian tube and ovary  2. Colon, segmental resection, additional rectosigmoid  3. Colon, resection margin (donut)  4. Colon, resection margin (donut)  Specimen  Clinical Information  1. Recurrent sigmoid diverticulitis (je)  Gross  1. Received in formalin is an 18 cm segment of colon, clinically rectosigmoid colon. One end is patent and is  clinically noted to represent the proximal margin. The distal margin is stapled. There are adhesions present  1 of 2  FINAL for Carmen Green, Carmen Green ([id number])  Gross(continued)  on the serosal surface. There is a 4.2 x 3.2 x 3.2 cm adherent ovoid structure, which grossly appears to  represent an ovary. The cut surface shows a 2.3 cm abscess containing tan-yellow purulent material. The  mucosal surface of the colon is glistening, tan and there are multiple diverticula present. There is a  communication between the base of one diverticulum and the adherent ovary at the previously described  abscess. The fallopian tube is not distinctly identified, however, there is a 1 cm cystic space possibly  representing a dilated tube. The mucosa at the margins of resection grossly appears viable. Sections are  submitted in four cassettes.  A = section of diverticulum involving the ovarian abscess.  B = section of uninvolved diverticulum.  C = ovary.  D = sections of structures possibly representing fallopian tube.  2. Received in formalin is a 7 cm segment of colon, clinically additional rectosigmoid colon. The margins are  stapled and there is a suture attached at one margin clinically identifying it as the new distal margin. The  serosa is smooth, tan to hyperemic. The mucosa is glistening, tan. There are a few diverticula present.  Sections are submitted in two cassettes.  A = sections from new distal margin.  B = diverticulum.  3. Received in formalin is an anastomotic ring clinically identified as proximal. There  is a blue suture  attached. The ring measures 2 cm in diameter x 0.7 cm in length. The mucosa is glistening and tan.  Sections are submitted in one cassette.  4. Received in formalin is an anastomotic ring  clinically identified as new distal margin. The ring measures 2  cm in diameter x 1.5 cm in length. The mucosa is glistening and tan. Sections are submitted in one  cassette. (GRP:ecj 11/15/2013)  Report signed out from the following location(s)  Technical Component and Interpretation performed at [email or web address], [place], [place] [address]. CLIA #: [id number]   This patient returns for surgical re-evaluation.  She is still having some soreness on her incision.  Using ibuprofen.  Does not like the oxycodone.  Makes her feel loopy.  Appetite returning.  Bowels becoming more solid and regular this past week.  No fevers or chills.  Energy level better.  She comes today with her husband.  Overall he feels she is recovering well.  She is mildly anxious but seems to be feeling better this past week.  Patient Active Problem List   Diagnosis Date Noted  . Ovarian abscess due to diverticulitis s/p LSO 11/15/2013 11/16/2013  . 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 CHEST PAIN-UNSPECIFIED 07/03/2010  . CARDIAC MURMUR 05/22/2010  . ANXIETY 11/07/2008  . Complete heart block 11/07/2008  . COLONIC POLYPS, HX OF 06/28/2008  . HYPERLIPIDEMIA 06/25/2008  . HYPERTENSION 06/25/2008  . MITRAL VALVE PROLAPSE 06/25/2008  . CARDIOMYOPATHY 06/25/2008  . GERD 06/25/2008  . HIATAL HERNIA 06/25/2008    Past Medical History  Diagnosis Date  . Hypertension   . Complete heart block   . Hyperlipidemia     s/p PPM implant by Dr Olevia Perches 1992 with generator change 2004 ([facility]), she is device dependant  . Mitral valve prolapse   . Anxiety   . Hiatal hernia   . GERD (gastroesophageal reflux disease)   . Colon polyp   . Esophagitis   . Diverticulitis   . Pacemaker   . Heart murmur   . H/O seasonal allergies   . Arthritis   . Contusion of chest wall 10/10/2007    Centricity Description: CHEST WALL CONTUSION Qualifier: Diagnosis of  By: Aline Brochure MD,  Dorothyann Peng   Centricity Description: CONTUSION, RIGHT RIB Qualifier: Diagnosis of  By: Aline Brochure MD, Dorothyann Peng    . ESOPHAGITIS 06/25/2008    Qualifier: History of  By: Harlon Ditty CMA (AAMA), Dottie      Past Surgical History  Procedure Laterality Date  . Left ovary cyst removal  1972  . Pacemaker insertion  1992, 2004, 2012    device dependant  . Tubal ligation    . Colonoscopy  10/19/2004    LEC  . Polypectomy    . Appendectomy    . Breast surgery Right     biopsy  . Eye surgery Left     cataract extraction with IOL  . Laparoscopic low anterior resection  11/16/2013    Robotic  . Robotic assisted salpingo oopherectomy Left 11/16/2013    Robotic en bloc w LAR resection  . Proctoscopy  11/15/2013    Procedure: RIGID PROCTOSCOPY;  Surgeon: Carmen Hector, MD;  Location: [facility];  Service: General;;  . Laparoscopic lysis of adhesions N/A 11/15/2013    Procedure: LAPAROSCOPIC LYSIS OF ADHESIONS;  Surgeon: Carmen Hector, MD;  Location: [facility];  Service: General;  Laterality: N/A;    History   Social  History  . Marital Status: Married    Spouse Name: N/A    Number of Children: 1  . Years of Education: N/A   Occupational History  . REGISTRATION    Social History Main Topics  . Smoking status: Never Smoker   . Smokeless tobacco: Never Used  . Alcohol Use: No  . Drug Use: No  . Sexual Activity: Not on file   Other Topics Concern  . Not on file   Social History Narrative   Lives with spouse in [place]   Works for an orthopoedic office in [place]          Family History  Problem Relation Age of Onset  . Diabetes Mother   . Stroke Mother   . Heart disease Maternal Aunt   . Heart disease Maternal Grandmother   . Colon cancer Neg Hx   . Diabetes Father   . Lung cancer Maternal Grandfather   . Lung cancer Father     Current Outpatient Prescriptions  Medication Sig Dispense Refill  . buPROPion (WELLBUTRIN XL) 300 MG 24 hr tablet Take 300 mg by mouth every morning.        . busPIRone (BUSPAR) 10 MG tablet Take 10 mg by mouth 3 (three) times daily.        . cetirizine (ZYRTEC) 10 MG tablet Take 10 mg by mouth daily as needed for allergies.       Marland Kitchen esomeprazole (NEXIUM) 40 MG capsule Take 40 mg by mouth daily.      Marland Kitchen estrogen, conjugated,-medroxyprogesterone (PREMPRO) 0.45-1.5 MG per tablet Take 1 tablet by mouth daily.        . fluticasone (FLONASE) 50 MCG/ACT nasal spray Place 1 spray into the nose daily.       Marland Kitchen guaiFENesin (MUCINEX) 600 MG 12 hr tablet Take 1,200 mg by mouth 2 (two) times daily as needed for cough.       Marland Kitchen ibuprofen (ADVIL,MOTRIN) 400 MG tablet Take 1-2 tablets (400-800 mg total) by mouth every 6 (six) hours as needed for fever, headache, mild pain or moderate pain.  40 tablet  1  . lisinopril-hydrochlorothiazide (PRINZIDE,ZESTORETIC) 20-12.5 MG per tablet Take 1 tablet by mouth every morning.       Marland Kitchen LORazepam (ATIVAN) 0.5 MG tablet Take 0.5 mg by mouth at bedtime.       . Multiple Vitamin (MULTIVITAMIN) tablet Take 1 tablet by mouth daily.        . rosuvastatin (CRESTOR) 10 MG tablet Take 10 mg by mouth every morning.        No current facility-administered medications for this visit.     Allergies  Allergen Reactions  . Cephalexin Rash  . Penicillins Rash    BP 126/82  Pulse 69  Temp(Src) 97.9 F (36.6 C)  Ht 5\' 1"  (1.549 m)  Wt 137 lb (62.143 kg)  BMI 25.90 kg/m2  Dg Chest 2 View  11/09/2013   CLINICAL DATA:  Preoperative partial colectomy; hypertension ; cardiac arrhythmia  EXAM: CHEST  2 VIEW  COMPARISON:  October 02, 2007  FINDINGS: Pacemaker leads are attached to the right atrium and right ventricle. No pneumothorax. Lungs are clear. Heart size and pulmonary vascularity are normal. No adenopathy. There is mild degenerative change in the thoracic spine.  IMPRESSION: No edema or consolidation.   Electronically Signed   By: Lowella Grip M.D.   On: 11/09/2013 15:20     Review of Systems  Constitutional: Negative for  fever, chills and diaphoresis.  HENT: Negative for ear pain, sore throat and trouble swallowing.   Eyes: Negative for photophobia and visual disturbance.  Respiratory: Negative for cough and choking.   Cardiovascular: Negative for chest pain and palpitations.  Gastrointestinal: Negative for nausea, vomiting, abdominal pain, diarrhea, constipation, anal bleeding and rectal pain.  Genitourinary: Negative for dysuria, frequency and difficulty urinating.  Musculoskeletal: Negative for gait problem and myalgias.  Skin: Negative for color change, pallor and rash.  Neurological: Negative for dizziness, speech difficulty, weakness and numbness.  Hematological: Negative for adenopathy.  Psychiatric/Behavioral: Negative for confusion and agitation. The patient is not nervous/anxious.        Objective:   Physical Exam  Constitutional: She is oriented to person, place, and time. She appears well-developed and well-nourished. No distress.  HENT:  Head: Normocephalic.  Mouth/Throat: Oropharynx is clear and moist. No oropharyngeal exudate.  Eyes: Conjunctivae and EOM are normal. Pupils are equal, round, and reactive to light. No scleral icterus.  Neck: Normal range of motion. No tracheal deviation present.  Cardiovascular: Normal rate and intact distal pulses.   Pulmonary/Chest: Effort normal. No respiratory distress. She exhibits no tenderness.  Abdominal: Soft. She exhibits no distension. There is no tenderness. Hernia confirmed negative in the right inguinal area and confirmed negative in the left inguinal area.  Incisions clean with normal healing ridges.  No hernias  Genitourinary: No vaginal discharge found.  Musculoskeletal: Normal range of motion. She exhibits no tenderness.  Lymphadenopathy:       Right: No inguinal adenopathy present.       Left: No inguinal adenopathy present.  Neurological: She is alert and oriented to person, place, and time. No cranial nerve deficit. She exhibits  normal muscle tone. Coordination normal.  Skin: Skin is warm and dry. No rash noted. She is not diaphoretic.  Psychiatric: She has a normal mood and affect. Her behavior is normal.       Assessment:     Recovering only 2 weeks s/p robotic colectomy for Diverticulitis with contained abscess to ovary      Plan:     Increase activity as tolerated to regular activity.  Low impact exercise such as walking an hour a day at least ideal.  Do not push through pain.  Diet as tolerated.  Low fat high fiber diet ideal.  Bowel regimen with 30 g fiber a day and fiber supplement as needed to avoid problems.  Return to clinic 3 weeks, sooner as needed.   Instructions discussed.  Followup with primary care physician for other health issues as would normally be done.  Consider screening for malignancies (breast, prostate, colon, melanoma, etc) as appropriate.  Questions answered.  The patient expressed understanding and appreciation

## 2013-12-05 NOTE — Patient Instructions (Signed)
Managing Pain  Pain after surgery or related to activity is often due to strain/injury to muscle, tendon, nerves and/or incisions.  This pain is usually short-term and will improve in a few months.   Many people find it helpful to do the following things TOGETHER to help speed the process of healing and to get back to regular activity more quickly:  1. Avoid heavy physical activity a.  no lifting greater than 20 pounds b. Do not "push through" the pain.  Listen to your body and avoid positions and maneuvers than reproduce the pain c. Walking is okay as tolerated, but go slowly and stop when getting sore.  d. Remember: If it hurts to do it, then don't do it! 2. Take Anti-inflammatory medication  a. Take with food/snack around the clock for 1-2 weeks i. This helps the muscle and nerve tissues become less irritable and calm down faster b. Choose ONE of the following over-the-counter medications: i. Naproxen 220mg  tabs (ex. Aleve) 1-2 pills twice a day  ii. Ibuprofen 200mg  tabs (ex. Advil, Motrin) 3-4 pills with every meal and just before bedtime iii. Acetaminophen 500mg  tabs (Tylenol) 1-2 pills with every meal and just before bedtime 3. Use a Heating pad or Ice/Cold Pack a. 4-6 times a day b. May use warm bath/hottub  or showers 4. Try Gentle Massage and/or Stretching  a. at the area of pain many times a day b. stop if you feel pain - do not overdo it  Try these steps together to help you body heal faster and avoid making things get worse.  Doing just one of these things may not be enough.    If you are not getting better after two weeks or are noticing you are getting worse, contact our office for further advice; we may need to re-evaluate you & see what other things we can do to help.  Managing Pain  Pain after surgery or related to activity is often due to strain/injury to muscle, tendon, nerves and/or incisions.  This pain is usually short-term and will improve in a few months.    Many people find it helpful to do the following things TOGETHER to help speed the process of healing and to get back to regular activity more quickly:  5. Avoid heavy physical activity a.  no lifting greater than 20 pounds b. Do not "push through" the pain.  Listen to your body and avoid positions and maneuvers than reproduce the pain c. Walking is okay as tolerated, but go slowly and stop when getting sore.  d. Remember: If it hurts to do it, then don't do it! 6. Take Anti-inflammatory medication  a. Take with food/snack around the clock for 1-2 weeks i. This helps the muscle and nerve tissues become less irritable and calm down faster b. Choose ONE of the following over-the-counter medications: i. Naproxen 220mg  tabs (ex. Aleve) 1-2 pills twice a day  ii. Ibuprofen 200mg  tabs (ex. Advil, Motrin) 3-4 pills with every meal and just before bedtime iii. Acetaminophen 500mg  tabs (Tylenol) 1-2 pills with every meal and just before bedtime 7. Use a Heating pad or Ice/Cold Pack a. 4-6 times a day b. May use warm bath/hottub  or showers 8. Try Gentle Massage and/or Stretching  a. at the area of pain many times a day b. stop if you feel pain - do not overdo it  Try these steps together to help you body heal faster and avoid making things get worse.  Doing just one  of these things may not be enough.    If you are not getting better after two weeks or are noticing you are getting worse, contact our office for further advice; we may need to re-evaluate you & see what other things we can do to help.  Diverticulosis Diverticulosis is the condition that develops when small pouches (diverticula) form in the wall of your colon. Your colon, or large intestine, is where water is absorbed and stool is formed. The pouches form when the inside layer of your colon pushes through weak spots in the outer layers of your colon. CAUSES  No one knows exactly what causes diverticulosis. RISK FACTORS  Being  older than 53. Your risk for this condition increases with age. Diverticulosis is rare in people younger than 40 years. By age 9, almost everyone has it.  Eating a low-fiber diet.  Being frequently constipated.  Being overweight.  Not getting enough exercise.  Smoking.  Taking over-the-counter pain medicines, like aspirin and ibuprofen. SYMPTOMS  Most people with diverticulosis do not have symptoms. DIAGNOSIS  Because diverticulosis often has no symptoms, health care providers often discover the condition during an exam for other colon problems. In many cases, a health care provider will diagnose diverticulosis while using a flexible scope to examine the colon (colonoscopy). TREATMENT  If you have never developed an infection related to diverticulosis, you may not need treatment. If you have had an infection before, treatment may include:  Eating more fruits, vegetables, and grains.  Taking a fiber supplement.  Taking a live bacteria supplement (probiotic).  Taking medicine to relax your colon. HOME CARE INSTRUCTIONS   Drink at least 6-8 glasses of water each day to prevent constipation.  Try not to strain when you have a bowel movement.  Keep all follow-up appointments. If you have had an infection before:  Increase the fiber in your diet as directed by your health care provider or dietitian.  Take a dietary fiber supplement if your health care provider approves.  Only take medicines as directed by your health care provider. SEEK MEDICAL CARE IF:   You have abdominal pain.  You have bloating.  You have cramps.  You have not gone to the bathroom in 3 days. SEEK IMMEDIATE MEDICAL CARE IF:   Your pain gets worse.  Yourbloating becomes very bad.  You have a fever or chills, and your symptoms suddenly get worse.  You begin vomiting.  You have bowel movements that are bloody or black. MAKE SURE YOU:  Understand these instructions.  Will watch your  condition.  Will get help right away if you are not doing well or get worse. Document Released: 03/04/2004 Document Revised: 06/12/2013 Document Reviewed: 05/02/2013 [facility] Patient Information 2015 [place], [place]. This information is not intended to replace advice given to you by your health care provider. Make sure you discuss any questions you have with your health care provider.

## 2013-12-18 ENCOUNTER — Ambulatory Visit (INDEPENDENT_AMBULATORY_CARE_PROVIDER_SITE_OTHER): Payer: Commercial Managed Care - PPO | Admitting: Surgery

## 2013-12-18 ENCOUNTER — Telehealth (INDEPENDENT_AMBULATORY_CARE_PROVIDER_SITE_OTHER): Payer: Self-pay

## 2013-12-18 ENCOUNTER — Encounter (INDEPENDENT_AMBULATORY_CARE_PROVIDER_SITE_OTHER): Payer: Self-pay | Admitting: Surgery

## 2013-12-18 VITALS — BP 126/74 | HR 74 | Temp 98.4°F | Ht 61.0 in | Wt 138.0 lb

## 2013-12-18 DIAGNOSIS — IMO0002 Reserved for concepts with insufficient information to code with codable children: Secondary | ICD-10-CM

## 2013-12-18 DIAGNOSIS — K91840 Postprocedural hemorrhage and hematoma of a digestive system organ or structure following a digestive system procedure: Secondary | ICD-10-CM

## 2013-12-18 NOTE — Progress Notes (Signed)
Patient 1 month out from her robotic-assisted sigmoid colectomy by Dr. Johney [place] for diverticular disease. She noticed some blood in a recent bowel movement. The amount was minimal. It involved streaking but not in the bowel.    Exam: abdomen soft.  Rectal exam normal.  Anoscopy shows no blood in vault.     Impression: Status post robotic sigmoid colectomy with minimal bleeding. No evidence of any active bleeding or heavy bleeding.  Plan: I reassured the patient. She looks well and the bleeding seems to be minimal. This is more likely from her anastomosis and should resolve with time. Her stools are becoming more solid which may stretch the anastomosis. I told her to call for bleeding becomes heavy or frequent. Keep followup for next month with Dr. Johney [place].

## 2013-12-18 NOTE — Patient Instructions (Signed)
If the bleeding becomes heavy,  Seek care in the ER.  Keep follow up with Dr Johney [place]. No heavy bleeding noted on exam.

## 2013-12-18 NOTE — Telephone Encounter (Signed)
Pt called stating she noticed bright red blood through out stool with her last couple of BMs. Pt states it is not just when she wipes. No rectal pain. Pt very concerned. Pt post colon surgery in May. Pt given appt today to be evaluated in the urgent office.

## 2013-12-26 ENCOUNTER — Ambulatory Visit (INDEPENDENT_AMBULATORY_CARE_PROVIDER_SITE_OTHER): Payer: 59 | Admitting: *Deleted

## 2013-12-26 ENCOUNTER — Encounter: Payer: Self-pay | Admitting: Internal Medicine

## 2013-12-26 DIAGNOSIS — I442 Atrioventricular block, complete: Secondary | ICD-10-CM

## 2013-12-26 NOTE — Progress Notes (Signed)
Remote pacemaker transmission.   

## 2013-12-27 ENCOUNTER — Encounter (INDEPENDENT_AMBULATORY_CARE_PROVIDER_SITE_OTHER): Payer: Self-pay

## 2013-12-28 LAB — MDC_IDC_ENUM_SESS_TYPE_REMOTE
Battery Remaining Percentage: 82 %
Battery Voltage: 2.96 V
Brady Statistic AP VP Percent: 1.5 %
Brady Statistic AP VS Percent: 1 %
Brady Statistic AS VP Percent: 98 %
Brady Statistic RA Percent Paced: 1.3 %
Brady Statistic RV Percent Paced: 99 %
Implantable Pulse Generator Serial Number: 7252855
Lead Channel Impedance Value: 450 Ohm
Lead Channel Impedance Value: 510 Ohm
Lead Channel Pacing Threshold Pulse Width: 0.5 ms
Lead Channel Pacing Threshold Pulse Width: 0.5 ms
Lead Channel Sensing Intrinsic Amplitude: 2.2 mV
Lead Channel Sensing Intrinsic Amplitude: 9.7 mV
Lead Channel Setting Pacing Amplitude: 1.875
Lead Channel Setting Pacing Pulse Width: 0.5 ms
Lead Channel Setting Sensing Sensitivity: 4 mV
MDC IDC MSMT BATTERY REMAINING LONGEVITY: 82 mo
MDC IDC MSMT LEADCHNL RA PACING THRESHOLD AMPLITUDE: 1.5 V
MDC IDC MSMT LEADCHNL RV PACING THRESHOLD AMPLITUDE: 1.625 V
MDC IDC SESS DTM: 20150708074139
MDC IDC SET LEADCHNL RA PACING AMPLITUDE: 3 V
MDC IDC STAT BRADY AS VS PERCENT: 1 %

## 2013-12-31 ENCOUNTER — Encounter (INDEPENDENT_AMBULATORY_CARE_PROVIDER_SITE_OTHER): Payer: Self-pay

## 2013-12-31 ENCOUNTER — Encounter (INDEPENDENT_AMBULATORY_CARE_PROVIDER_SITE_OTHER): Payer: Self-pay | Admitting: Surgery

## 2013-12-31 ENCOUNTER — Ambulatory Visit (INDEPENDENT_AMBULATORY_CARE_PROVIDER_SITE_OTHER): Payer: Commercial Managed Care - PPO | Admitting: Surgery

## 2013-12-31 VITALS — BP 124/82 | HR 56 | Temp 98.0°F | Ht 61.0 in | Wt 135.0 lb

## 2013-12-31 DIAGNOSIS — K5732 Diverticulitis of large intestine without perforation or abscess without bleeding: Secondary | ICD-10-CM

## 2013-12-31 DIAGNOSIS — K5720 Diverticulitis of large intestine with perforation and abscess without bleeding: Secondary | ICD-10-CM

## 2013-12-31 NOTE — Patient Instructions (Signed)
GETTING TO GOOD BOWEL HEALTH. Irregular bowel habits such as constipation and diarrhea can lead to many problems over time.  Having one soft bowel movement a day is the most important way to prevent further problems.  The anorectal canal is designed to handle stretching and feces to safely manage our ability to get rid of solid waste (feces, poop, stool) out of our body.  BUT, hard constipated stools can act like ripping concrete bricks and diarrhea can be a burning fire to this very sensitive area of our body, causing inflamed hemorrhoids, anal fissures, increasing risk is perirectal abscesses, abdominal pain/bloating, an making irritable bowel worse.     The goal: ONE SOFT BOWEL MOVEMENT A DAY!  To have soft, regular bowel movements:    Drink at least 8 tall glasses of water a day.     Take plenty of fiber.  Fiber is the undigested part of plant food that passes into the colon, acting s "natures broom" to encourage bowel motility and movement.  Fiber can absorb and hold large amounts of water. This results in a larger, bulkier stool, which is soft and easier to pass. Work gradually over several weeks up to 6 servings a day of fiber (25g a day even more if needed) in the form of: o Vegetables -- Root (potatoes, carrots, turnips), leafy green (lettuce, salad greens, celery, spinach), or cooked high residue (cabbage, broccoli, etc) o Fruit -- Fresh (unpeeled skin & pulp), Dried (prunes, apricots, cherries, etc ),  or stewed ( applesauce)  o Whole grain breads, pasta, etc (whole wheat)  o Bran cereals    Bulking Agents -- This type of water-retaining fiber generally is easily obtained each day by one of the following:  o Psyllium bran -- The psyllium plant is remarkable because its ground seeds can retain so much water. This product is available as Metamucil, Konsyl, Effersyllium, Per Diem Fiber, or the less expensive generic preparation in drug and health food stores. Although labeled a laxative, it really  is not a laxative.  o Methylcellulose -- This is another fiber derived from wood which also retains water. It is available as Citrucel. o Polyethylene Glycol - and "artificial" fiber commonly called Miralax or Glycolax.  It is helpful for people with gassy or bloated feelings with regular fiber o Flax Seed - a less gassy fiber than psyllium   No reading or other relaxing activity while on the toilet. If bowel movements take longer than 5 minutes, you are too constipated   AVOID CONSTIPATION.  High fiber and water intake usually takes care of this.  Sometimes a laxative is needed to stimulate more frequent bowel movements, but    Laxatives are not a good long-term solution as it can wear the colon out. o Osmotics (Milk of Magnesia, Fleets phosphosoda, Magnesium citrate, MiraLax, GoLytely) are safer than  o Stimulants (Senokot, Castor Oil, Dulcolax, Ex Lax)    o Do not take laxatives for more than 7days in a row.    IF SEVERELY CONSTIPATED, try a Bowel Retraining Program: o Do not use laxatives.  o Eat a diet high in roughage, such as bran cereals and leafy vegetables.  o Drink six (6) ounces of prune or apricot juice each morning.  o Eat two (2) large servings of stewed fruit each day.  o Take one (1) heaping tablespoon of a psyllium-based bulking agent twice a day. Use sugar-free sweetener when possible to avoid excessive calories.  o Eat a normal breakfast.  o   Set aside 15 minutes after breakfast to sit on the toilet, but do not strain to have a bowel movement.  o If you do not have a bowel movement by the third day, use an enema and repeat the above steps.    Controlling diarrhea o Switch to liquids and simpler foods for a few days to avoid stressing your intestines further. o Avoid dairy products (especially milk & ice cream) for a short time.  The intestines often can lose the ability to digest lactose when stressed. o Avoid foods that cause gassiness or bloating.  Typical foods include  beans and other legumes, cabbage, broccoli, and dairy foods.  Every person has some sensitivity to other foods, so listen to our body and avoid those foods that trigger problems for you. o Adding fiber (Citrucel, Metamucil, psyllium, Miralax) gradually can help thicken stools by absorbing excess fluid and retrain the intestines to act more normally.  Slowly increase the dose over a few weeks.  Too much fiber too soon can backfire and cause cramping & bloating. o Probiotics (such as active yogurt, Align, etc) may help repopulate the intestines and colon with normal bacteria and calm down a sensitive digestive tract.  Most studies show it to be of mild help, though, and such products can be costly. o Medicines:   Bismuth subsalicylate (ex. Kayopectate, Pepto Bismol) every 30 minutes for up to 6 doses can help control diarrhea.  Avoid if pregnant.   Loperamide (Immodium) can slow down diarrhea.  Start with two tablets (23m total) first and then try one tablet every 6 hours.  Avoid if you are having fevers or severe pain.  If you are not better or start feeling worse, stop all medicines and call your doctor for advice o Call your doctor if you are getting worse or not better.  Sometimes further testing (cultures, endoscopy, X-ray studies, bloodwork, etc) may be needed to help diagnose and treat the cause of the diarrhea.  Diverticulosis Diverticulosis is the condition that develops when small pouches (diverticula) form in the wall of your colon. Your colon, or large intestine, is where water is absorbed and stool is formed. The pouches form when the inside layer of your colon pushes through weak spots in the outer layers of your colon. CAUSES  No one knows exactly what causes diverticulosis. RISK FACTORS  Being older than 547 Your risk for this condition increases with age. Diverticulosis is rare in people younger than 40 years. By age 65 almost everyone has it.  Eating a low-fiber diet.  Being  frequently constipated.  Being overweight.  Not getting enough exercise.  Smoking.  Taking over-the-counter pain medicines, like aspirin and ibuprofen. SYMPTOMS  Most people with diverticulosis do not have symptoms. DIAGNOSIS  Because diverticulosis often has no symptoms, health care providers often discover the condition during an exam for other colon problems. In many cases, a health care provider will diagnose diverticulosis while using a flexible scope to examine the colon (colonoscopy). TREATMENT  If you have never developed an infection related to diverticulosis, you may not need treatment. If you have had an infection before, treatment may include:  Eating more fruits, vegetables, and grains.  Taking a fiber supplement.  Taking a live bacteria supplement (probiotic).  Taking medicine to relax your colon. HOME CARE INSTRUCTIONS   Drink at least 6-8 glasses of water each day to prevent constipation.  Try not to strain when you have a bowel movement.  Keep all follow-up appointments. If you  have had an infection before:  Increase the fiber in your diet as directed by your health care provider or dietitian.  Take a dietary fiber supplement if your health care provider approves.  Only take medicines as directed by your health care provider. SEEK MEDICAL CARE IF:   You have abdominal pain.  You have bloating.  You have cramps.  You have not gone to the bathroom in 3 days. SEEK IMMEDIATE MEDICAL CARE IF:   Your pain gets worse.  Yourbloating becomes very bad.  You have a fever or chills, and your symptoms suddenly get worse.  You begin vomiting.  You have bowel movements that are bloody or black. MAKE SURE YOU:  Understand these instructions.  Will watch your condition.  Will get help right away if you are not doing well or get worse. Document Released: 03/04/2004 Document Revised: 06/12/2013 Document Reviewed: 05/02/2013 [facility] Patient  Information 2015 [place], [place]. This information is not intended to replace advice given to you by your health care provider. Make sure you discuss any questions you have with your health care provider.  High-Fiber Diet Fiber is found in fruits, vegetables, and grains. A high-fiber diet encourages the addition of more whole grains, legumes, fruits, and vegetables in your diet. The recommended amount of fiber for adult males is 38 g per day. For adult females, it is 25 g per day. Pregnant and lactating women should get 28 g of fiber per day. If you have a digestive or bowel problem, ask your caregiver for advice before adding high-fiber foods to your diet. Eat a variety of high-fiber foods instead of only a select few type of foods.  PURPOSE  To increase stool bulk.  To make bowel movements more regular to prevent constipation.  To lower cholesterol.  To prevent overeating. WHEN IS THIS DIET USED?  It may be used if you have constipation and hemorrhoids.  It may be used if you have uncomplicated diverticulosis (intestine condition) and irritable bowel syndrome.  It may be used if you need help with weight management.  It may be used if you want to add it to your diet as a protective measure against atherosclerosis, diabetes, and cancer. SOURCES OF FIBER  Whole-grain breads and cereals.  Fruits, such as apples, oranges, bananas, berries, prunes, and pears.  Vegetables, such as green peas, carrots, sweet potatoes, beets, broccoli, cabbage, spinach, and artichokes.  Legumes, such split peas, soy, lentils.  Almonds. FIBER CONTENT IN FOODS Starches and Grains / Dietary Fiber (g)  Cheerios, 1 cup / 3 g  Corn Flakes cereal, 1 cup / 0.7 g  Rice crispy treat cereal, 1 cup / 0.3 g  Instant oatmeal (cooked),  cup / 2 g  Frosted wheat cereal, 1 cup / 5.1 g  Brown, long-grain rice (cooked), 1 cup / 3.5 g  White, long-grain rice (cooked), 1 cup / 0.6 g  Enriched macaroni  (cooked), 1 cup / 2.5 g Legumes / Dietary Fiber (g)  Baked beans (canned, plain, or vegetarian),  cup / 5.2 g  Kidney beans (canned),  cup / 6.8 g  Pinto beans (cooked),  cup / 5.5 g Breads and Crackers / Dietary Fiber (g)  Plain or honey graham crackers, 2 squares / 0.7 g  Saltine crackers, 3 squares / 0.3 g  Plain, salted pretzels, 10 pieces / 1.8 g  Whole-wheat bread, 1 slice / 1.9 g  White bread, 1 slice / 0.7 g  Raisin bread, 1 slice / 1.2 g  Plain bagel, 3 oz / 2 g  Flour tortilla, 1 oz / 0.9 g  Corn tortilla, 1 small / 1.5 g  Hamburger or hotdog bun, 1 small / 0.9 g Fruits / Dietary Fiber (g)  Apple with skin, 1 medium / 4.4 g  Sweetened applesauce,  cup / 1.5 g  Banana,  medium / 1.5 g  Grapes, 10 grapes / 0.4 g  Orange, 1 small / 2.3 g  Raisin, 1.5 oz / 1.6 g  Melon, 1 cup / 1.4 g Vegetables / Dietary Fiber (g)  Green beans (canned),  cup / 1.3 g  Carrots (cooked),  cup / 2.3 g  Broccoli (cooked),  cup / 2.8 g  Peas (cooked),  cup / 4.4 g  Mashed potatoes,  cup / 1.6 g  Lettuce, 1 cup / 0.5 g  Corn (canned),  cup / 1.6 g  Tomato,  cup / 1.1 g Document Released: 06/07/2005 Document Revised: 12/07/2011 Document Reviewed: 09/09/2011 [employer] Patient Information 2015 [place], [place]. This information is not intended to replace advice given to you by your health care provider. Make sure you discuss any questions you have with your health care provider.

## 2013-12-31 NOTE — Progress Notes (Signed)
Subjective:     Patient ID: Carmen Green, female   DOB: [birth year], 63 y.o.   MRN: [id number]  HPI   Note: This dictation was prepared with Dragon/digital dictation along with [facility] technology. Any transcriptional errors that result from this process are unintentional.       Carmen Green  April 28, 1951 [id number]  Patient Care Team: Octavio Graves, DO as PCP - General Altamese Dilling, RN as Registered Nurse Lafayette Dragon, MD as Consulting Physician (Gastroenterology) Adin Hector, MD as Consulting Physician (General Surgery) Larey Dresser, MD as Consulting Physician (Cardiology)  Procedure (Date: 11/15/2013):  POST-OPERATIVE DIAGNOSIS: recurrent sigmoid diverticulitis  PROCEDURE: Procedure(s):  ROBOTIC LYSIS OF ADHESIONS X 60 MIN  ROBOTIC ASSISTED LAPAROSCOPIC LOW ANTERIOR RESECTION  LEFT SALPINGO OOPHORECTOMY  OMENTOPEXY  RIGID PROCTOSCOPY  SURGEON: Surgeon(s):  Adin Hector, MD  ASSISTANT: Leighton Ruff, MD   Diagnosis  1. Colon, segmental resection, with left fallopian tube and ovary  - PERFORATED DIVERTICULUM WITH ASSOCIATED OVARIAN ABSCESS.  - DIVERTICULOSIS.  - FIBROUS ADHESIONS.  - HYDROSALPINX.  - NO DYSPLASIA OR MALIGNANCY IDENTIFIED.  2. Colon, segmental resection, additional rectosigmoid  - DIVERTICULITIS.  - MARGIN IS VIABLE WITHOUT ACUTE INFLAMMATION.  - NO DYSPLASIA OR MALIGNANCY.  3. Colon, resection margin (donut)  - ACUTE INFLAMMATION AND ISCHEMIC CHANGE.  - NO DYSPLASIA OR MALIGNANCY.  4. Colon, resection margin (donut)  - FOCAL ISCHEMIC CHANGE.  - NO DYSPLASIA OR MALIGNANCY.  [name] MD  Pathologist, Electronic Signature  (Case signed 11/16/2013)  Specimen Carmen Green and Clinical Information  Specimen(s) Obtained:  1. Colon, segmental resection, with left fallopian tube and ovary  2. Colon, segmental resection, additional rectosigmoid  3. Colon, resection margin (donut)  4. Colon, resection margin (donut)  Specimen Clinical  Information  1. Recurrent sigmoid diverticulitis (je)  Carmen Green  1. Received in formalin is an 18 cm segment of colon, clinically rectosigmoid colon. One end is patent and is  clinically noted to represent the proximal margin. The distal margin is stapled. There are adhesions present  1 of 2  FINAL for Carmen Green, Carmen Green ([id number])  Abdirahman Chittum(continued)  on the serosal surface. There is a 4.2 x 3.2 x 3.2 cm adherent ovoid structure, which grossly appears to  represent an ovary. The cut surface shows a 2.3 cm abscess containing tan-yellow purulent material. The  mucosal surface of the colon is glistening, tan and there are multiple diverticula present. There is a  communication between the base of one diverticulum and the adherent ovary at the previously described  abscess. The fallopian tube is not distinctly identified, however, there is a 1 cm cystic space possibly  representing a dilated tube. The mucosa at the margins of resection grossly appears viable. Sections are  submitted in four cassettes.  A = section of diverticulum involving the ovarian abscess.  B = section of uninvolved diverticulum.  C = ovary.  D = sections of structures possibly representing fallopian tube.  2. Received in formalin is a 7 cm segment of colon, clinically additional rectosigmoid colon. The margins are  stapled and there is a suture attached at one margin clinically identifying it as the new distal margin. The  serosa is smooth, tan to hyperemic. The mucosa is glistening, tan. There are a few diverticula present.  Sections are submitted in two cassettes.  A = sections from new distal margin.  B = diverticulum.  3. Received in formalin is an anastomotic ring clinically identified as proximal. There is a  blue suture  attached. The ring measures 2 cm in diameter x 0.7 cm in length. The mucosa is glistening and tan.  Sections are submitted in one cassette.  4. Received in formalin is an anastomotic ring clinically  identified as new distal margin. The ring measures 2  cm in diameter x 1.5 cm in length. The mucosa is glistening and tan. Sections are submitted in one  cassette. (GRP:ecj 11/15/2013)  Report signed out from the following location(s)  Technical Component and Interpretation performed at [place], [place], [place] [address]. CLIA #: [id number]   This patient returns for surgical re-evaluation.  She comes today with her husband.  She is feeling better overall.  Still some rectal bleeding but much less.  Occasional pole and left side but much better.  Sleeping through the night.  Avoiding nonsteroidals with rectal bleeding.  Appetite excellent.  Some occasional constipation.  Using MiraLAX only intermittently and not daily.  Anxious about getting another attack at some point.  No fevers or chills.  Energy level better.  She is mildly anxious but seems to be feeling better this past week.  Patient Active Problem List   Diagnosis Date Noted  . Ovarian abscess due to diverticulitis s/p LSO 11/15/2013 11/16/2013  . 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 CHEST PAIN-UNSPECIFIED 07/03/2010  . CARDIAC MURMUR 05/22/2010  . ANXIETY 11/07/2008  . Complete heart block 11/07/2008  . COLONIC POLYPS, HX OF 06/28/2008  . HYPERLIPIDEMIA 06/25/2008  . HYPERTENSION 06/25/2008  . MITRAL VALVE PROLAPSE 06/25/2008  . CARDIOMYOPATHY 06/25/2008  . GERD 06/25/2008  . HIATAL HERNIA 06/25/2008    Past Medical History  Diagnosis Date  . Hypertension   . Complete heart block   . Hyperlipidemia     s/p PPM implant by Dr Olevia Perches 1992 with generator change 2004 (SJM), she is device dependant  . Mitral valve prolapse   . Anxiety   . Hiatal hernia   . GERD (gastroesophageal reflux disease)   . Colon polyp   . Esophagitis   . Diverticulitis   . Pacemaker   . Heart murmur   . H/O seasonal allergies   . Arthritis   . Contusion of chest wall  10/10/2007    Centricity Description: CHEST WALL CONTUSION Qualifier: Diagnosis of  By: Aline Brochure MD, Dorothyann Peng   Centricity Description: CONTUSION, RIGHT RIB Qualifier: Diagnosis of  By: Aline Brochure MD, Dorothyann Peng    . ESOPHAGITIS 06/25/2008    Qualifier: History of  By: Harlon Ditty CMA (AAMA), Dottie      Past Surgical History  Procedure Laterality Date  . Left ovary cyst removal  1972  . Pacemaker insertion  1992, 2004, 2012    device dependant  . Tubal ligation    . Colonoscopy  10/19/2004    LEC  . Polypectomy    . Appendectomy    . Breast surgery Right     biopsy  . Eye surgery Left     cataract extraction with IOL  . Laparoscopic low anterior resection  11/16/2013    Robotic  . Robotic assisted salpingo oopherectomy Left 11/16/2013    Robotic en bloc w LAR resection  . Proctoscopy  11/15/2013    Procedure: RIGID PROCTOSCOPY;  Surgeon: Adin Hector, MD;  Location: [facility];  Service: General;;  . Laparoscopic lysis of adhesions N/A 11/15/2013    Procedure: LAPAROSCOPIC LYSIS OF ADHESIONS;  Surgeon: Adin Hector, MD;  Location: [facility];  Service:  General;  Laterality: N/A;    History   Social History  . Marital Status: Married    Spouse Name: N/A    Number of Children: 1  . Years of Education: N/A   Occupational History  . REGISTRATION    Social History Main Topics  . Smoking status: Never Smoker   . Smokeless tobacco: Never Used  . Alcohol Use: No  . Drug Use: No  . Sexual Activity: Not on file   Other Topics Concern  . Not on file   Social History Narrative   Lives with spouse in [place]   Works for an orthopoedic office in [place]          Family History  Problem Relation Age of Onset  . Diabetes Mother   . Stroke Mother   . Heart disease Maternal Aunt   . Heart disease Maternal Grandmother   . Colon cancer Neg Hx   . Diabetes Father   . Lung cancer Maternal Grandfather   . Lung cancer Father     Current Outpatient Prescriptions  Medication Sig  Dispense Refill  . buPROPion (WELLBUTRIN XL) 300 MG 24 hr tablet Take 300 mg by mouth every morning.       . busPIRone (BUSPAR) 10 MG tablet Take 10 mg by mouth 3 (three) times daily.        . cetirizine (ZYRTEC) 10 MG tablet Take 10 mg by mouth daily as needed for allergies.       Marland Kitchen esomeprazole (NEXIUM) 40 MG capsule Take 40 mg by mouth daily.      Marland Kitchen estrogen, conjugated,-medroxyprogesterone (PREMPRO) 0.45-1.5 MG per tablet Take 1 tablet by mouth daily.        . fluticasone (FLONASE) 50 MCG/ACT nasal spray Place 1 spray into the nose daily.       Marland Kitchen guaiFENesin (MUCINEX) 600 MG 12 hr tablet Take 1,200 mg by mouth 2 (two) times daily as needed for cough.       Marland Kitchen ibuprofen (ADVIL,MOTRIN) 400 MG tablet Take 1-2 tablets (400-800 mg total) by mouth every 6 (six) hours as needed for fever, headache, mild pain or moderate pain.  40 tablet  1  . lisinopril-hydrochlorothiazide (PRINZIDE,ZESTORETIC) 20-12.5 MG per tablet Take 1 tablet by mouth every morning.       Marland Kitchen LORazepam (ATIVAN) 0.5 MG tablet Take 0.5 mg by mouth at bedtime.       . Multiple Vitamin (MULTIVITAMIN) tablet Take 1 tablet by mouth daily.        . rosuvastatin (CRESTOR) 10 MG tablet Take 10 mg by mouth every morning.        No current facility-administered medications for this visit.     Allergies  Allergen Reactions  . Cephalexin Rash  . Oxycodone     "Loopy"  . Penicillins Rash    BP 124/82  Pulse 56  Temp(Src) 98 F (36.7 C)  Ht 5\' 1"  (1.549 m)  Wt 135 lb (61.236 kg)  BMI 25.52 kg/m2  Dg Chest 2 View  11/09/2013   CLINICAL DATA:  Preoperative partial colectomy; hypertension ; cardiac arrhythmia  EXAM: CHEST  2 VIEW  COMPARISON:  October 02, 2007  FINDINGS: Pacemaker leads are attached to the right atrium and right ventricle. No pneumothorax. Lungs are clear. Heart size and pulmonary vascularity are normal. No adenopathy. There is mild degenerative change in the thoracic spine.  IMPRESSION: No edema or consolidation.    Electronically Signed   By: Lowella Grip M.D.  On: 11/09/2013 15:20     Review of Systems  Constitutional: Negative for fever, chills and diaphoresis.  HENT: Negative for ear pain, sore throat and trouble swallowing.   Eyes: Negative for photophobia and visual disturbance.  Respiratory: Negative for cough and choking.   Cardiovascular: Negative for chest pain and palpitations.  Gastrointestinal: Negative for nausea, vomiting, abdominal pain, diarrhea, constipation, anal bleeding and rectal pain.  Genitourinary: Negative for dysuria, frequency and difficulty urinating.  Musculoskeletal: Negative for gait problem and myalgias.  Skin: Negative for color change, pallor and rash.  Neurological: Negative for dizziness, speech difficulty, weakness and numbness.  Hematological: Negative for adenopathy.  Psychiatric/Behavioral: Negative for confusion and agitation. The patient is not nervous/anxious.        Objective:   Physical Exam  Constitutional: She is oriented to person, place, and time. She appears well-developed and well-nourished. No distress.  HENT:  Head: Normocephalic.  Mouth/Throat: Oropharynx is clear and moist. No oropharyngeal exudate.  Eyes: Conjunctivae and EOM are normal. Pupils are equal, round, and reactive to light. No scleral icterus.  Neck: Normal range of motion. No tracheal deviation present.  Cardiovascular: Normal rate and intact distal pulses.   Pulmonary/Chest: Effort normal. No respiratory distress. She exhibits no tenderness.  Abdominal: Soft. She exhibits no distension. There is no tenderness. Hernia confirmed negative in the right inguinal area and confirmed negative in the left inguinal area.  Incisions clean with normal healing ridges.  No hernias  Genitourinary: No vaginal discharge found.  Musculoskeletal: Normal range of motion. She exhibits no tenderness.  Lymphadenopathy:       Right: No inguinal adenopathy present.       Left: No inguinal  adenopathy present.  Neurological: She is alert and oriented to person, place, and time. No cranial nerve deficit. She exhibits normal muscle tone. Coordination normal.  Skin: Skin is warm and dry. No rash noted. She is not diaphoretic.  Psychiatric: Her speech is normal and behavior is normal. Judgment and thought content normal. Her mood appears anxious. Her affect is not angry and not inappropriate. Cognition and memory are normal.  Anxious but consolable       Assessment:     Recovering only 6 weeks s/p robotic colectomy for Diverticulitis with contained abscess to ovary      Plan:     Increase activity as tolerated to regular activity.  Low impact exercise such as walking an hour a day at least ideal.  Do not push through pain.  Okay to return to work part-time this week and then full time next week.  Diet as tolerated.  Low fat high fiber diet ideal.  Bowel regimen with 30 g fiber a day and fiber supplement as needed to avoid problems.  Stick with the MiraLAX or some other fiber supplement every day the rest of her life.  Rectal bleeding mild and a new since.  Probably staple line of anastomosis.  Improving.  Should resolve completely.  Return to clinic as needed.   Instructions discussed.  Followup with primary care physician for other health issues as would normally be done.  Consider screening for malignancies (breast, prostate, colon, melanoma, etc) as appropriate.  Questions answered.  The patient & her husband expressed understanding and appreciation

## 2014-01-09 ENCOUNTER — Encounter: Payer: Self-pay | Admitting: Cardiology

## 2014-01-28 ENCOUNTER — Telehealth (INDEPENDENT_AMBULATORY_CARE_PROVIDER_SITE_OTHER): Payer: Self-pay

## 2014-01-28 NOTE — Telephone Encounter (Signed)
Pt called stating she was being treated for sinus infection by her PCP and now she has loose stools. Pt advised antibiotics could cause this but she will need to contact her pcp and advised them of her symptom. Pt states she understands and will call Dr Carmie End office.

## 2014-02-15 ENCOUNTER — Encounter (INDEPENDENT_AMBULATORY_CARE_PROVIDER_SITE_OTHER): Payer: Self-pay

## 2014-04-17 ENCOUNTER — Encounter: Payer: Self-pay | Admitting: *Deleted

## 2014-05-09 ENCOUNTER — Telehealth: Payer: Self-pay | Admitting: Internal Medicine

## 2014-05-10 ENCOUNTER — Telehealth: Payer: Self-pay | Admitting: Internal Medicine

## 2014-05-10 MED ORDER — ESOMEPRAZOLE MAGNESIUM 40 MG PO CPDR: 40.0000 mg | DELAYED_RELEASE_CAPSULE | Freq: Every day | ORAL | Status: DC

## 2014-05-10 NOTE — Telephone Encounter (Signed)
Left message advising patient that per [employer], they have approved esomeprazole for 1 year.

## 2014-05-10 NOTE — Telephone Encounter (Signed)
Rx sent. Pharmacy will let us know if PA is needed.  

## 2014-05-23 ENCOUNTER — Other Ambulatory Visit: Payer: Self-pay

## 2014-05-23 DIAGNOSIS — Z1231 Encounter for screening mammogram for malignant neoplasm of breast: Secondary | ICD-10-CM

## 2014-06-11 ENCOUNTER — Ambulatory Visit
Admission: RE | Admit: 2014-06-11 | Discharge: 2014-06-11 | Disposition: A | Discharge status: Home / Self Care | Payer: Medicare Other | Source: Ambulatory Visit

## 2014-06-11 DIAGNOSIS — Z1231 Encounter for screening mammogram for malignant neoplasm of breast: Secondary | ICD-10-CM

## 2014-06-12 ENCOUNTER — Ambulatory Visit: Payer: 59

## 2014-06-28 ENCOUNTER — Ambulatory Visit (INDEPENDENT_AMBULATORY_CARE_PROVIDER_SITE_OTHER): Payer: Medicare Other | Admitting: Internal Medicine

## 2014-06-28 ENCOUNTER — Encounter: Payer: Self-pay | Admitting: Internal Medicine

## 2014-06-28 VITALS — BP 130/64 | HR 77 | Ht 61.0 in | Wt 144.5 lb

## 2014-06-28 DIAGNOSIS — I442 Atrioventricular block, complete: Secondary | ICD-10-CM

## 2014-06-28 LAB — MDC_IDC_ENUM_SESS_TYPE_INCLINIC
Brady Statistic RA Percent Paced: 1.9 %
Brady Statistic RV Percent Paced: 99.39 %
Date Time Interrogation Session: 20160108165346
Implantable Pulse Generator Model: 2210
Implantable Pulse Generator Serial Number: 7252855
Lead Channel Impedance Value: 437.5 Ohm
Lead Channel Pacing Threshold Amplitude: 1.5 V
Lead Channel Pacing Threshold Amplitude: 1.5 V
Lead Channel Pacing Threshold Pulse Width: 0.7 ms
Lead Channel Pacing Threshold Pulse Width: 0.7 ms
Lead Channel Sensing Intrinsic Amplitude: 9 mV
Lead Channel Setting Pacing Amplitude: 1.75 V
Lead Channel Setting Pacing Amplitude: 3 V
Lead Channel Setting Pacing Pulse Width: 0.5 ms
MDC IDC MSMT BATTERY REMAINING LONGEVITY: 97.2 mo
MDC IDC MSMT BATTERY VOLTAGE: 2.93 V
MDC IDC MSMT LEADCHNL RA PACING THRESHOLD AMPLITUDE: 1.5 V
MDC IDC MSMT LEADCHNL RA SENSING INTR AMPL: 3.9 mV
MDC IDC MSMT LEADCHNL RV IMPEDANCE VALUE: 525 Ohm
MDC IDC MSMT LEADCHNL RV PACING THRESHOLD PULSEWIDTH: 0.5 ms
MDC IDC SET LEADCHNL RV SENSING SENSITIVITY: 4 mV

## 2014-06-28 NOTE — Patient Instructions (Signed)
Your physician wants you to follow-up in: 12 months with Dr Vallery Ridge will receive a reminder letter in the mail two months in advance. If you don't receive a letter, please call our office to schedule the follow-up appointment.  Remote monitoring is used to monitor your Pacemaker or ICD from home. This monitoring reduces the number of office visits required to check your device to one time per year. It allows [place] to keep an eye on the functioning of your device to ensure it is working properly. You are scheduled for a device check from home on 09/30/14. You may send your transmission at any time that day. If you have a wireless device, the transmission will be sent automatically. After your physician reviews your transmission, you will receive a postcard with your next transmission date.

## 2014-06-29 NOTE — Progress Notes (Signed)
PCP: Octavio Graves, DO Primary Cardiologist:  Dr Kearney Hard Carmen Green is a 64 y.o. female who presents today for routine electrophysiology followup.  Since last being seen in our clinic, the patient reports doing very well.  Today, she denies symptoms of palpitations, chest pain, shortness of breath,  lower extremity edema, dizziness, presyncope, or syncope. She underwent colonic resection for diverticulosis recently.  She has made good recovery. The patient is otherwise without complaint today.   Past Medical History  Diagnosis Date  . Hypertension   . Complete heart block   . Hyperlipidemia     s/p PPM implant by Dr Olevia Perches 1992 with generator change 2004 (SJM), she is device dependant  . Mitral valve prolapse   . Anxiety   . Hiatal hernia   . GERD (gastroesophageal reflux disease)   . Colon polyp   . Esophagitis   . Diverticulitis   . Pacemaker   . Heart murmur   . H/O seasonal allergies   . Arthritis   . Contusion of chest wall 10/10/2007    Centricity Description: CHEST WALL CONTUSION Qualifier: Diagnosis of  By: Aline Brochure MD, Dorothyann Peng   Centricity Description: CONTUSION, RIGHT RIB Qualifier: Diagnosis of  By: Aline Brochure MD, Dorothyann Peng    . ESOPHAGITIS 06/25/2008    Qualifier: History of  By: Harlon Ditty CMA (AAMA), Dottie     Past Surgical History  Procedure Laterality Date  . Left ovary cyst removal  1972  . Pacemaker insertion  1992, 2004, 2012    device dependant  . Tubal ligation    . Colonoscopy  10/19/2004    LEC  . Polypectomy    . Appendectomy    . Breast surgery Right     biopsy  . Eye surgery Left     cataract extraction with IOL  . Laparoscopic low anterior resection  11/16/2013    Robotic  . Robotic assisted salpingo oopherectomy Left 11/16/2013    Robotic en bloc w LAR resection  . Proctoscopy  11/15/2013    Procedure: RIGID PROCTOSCOPY;  Surgeon: Adin Hector, MD;  Location: [facility];  Service: General;;  . Laparoscopic lysis of adhesions N/A 11/15/2013     Procedure: LAPAROSCOPIC LYSIS OF ADHESIONS;  Surgeon: Adin Hector, MD;  Location: [facility];  Service: General;  Laterality: N/A;    Current Outpatient Prescriptions  Medication Sig Dispense Refill  . busPIRone (BUSPAR) 10 MG tablet Take 10 mg by mouth 3 (three) times daily.      . cetirizine (ZYRTEC) 10 MG tablet Take 10 mg by mouth daily as needed for allergies.     Marland Kitchen esomeprazole (NEXIUM) 40 MG capsule Take 1 capsule (40 mg total) by mouth daily. 180 capsule 0  . fluticasone (FLONASE) 50 MCG/ACT nasal spray Place 1 spray into the nose daily as needed for allergies.     Marland Kitchen guaiFENesin (MUCINEX) 600 MG 12 hr tablet Take 1,200 mg by mouth 2 (two) times daily as needed for cough.     Marland Kitchen ibuprofen (ADVIL,MOTRIN) 400 MG tablet Take 1-2 tablets (400-800 mg total) by mouth every 6 (six) hours as needed for fever, headache, mild pain or moderate pain. 40 tablet 1  . lisinopril-hydrochlorothiazide (PRINZIDE,ZESTORETIC) 20-12.5 MG per tablet Take 1 tablet by mouth every morning.     Marland Kitchen LORazepam (ATIVAN) 0.5 MG tablet Take 0.5 mg by mouth at bedtime.     . Multiple Vitamin (MULTIVITAMIN) tablet Take 1 tablet by mouth daily.      . rosuvastatin (CRESTOR)  10 MG tablet Take 10 mg by mouth every morning.      No current facility-administered medications for this visit.    Physical Exam: Filed Vitals:   06/28/14 1625  BP: 130/64  Pulse: 77  Height: 5\' 1"  (1.549 m)  Weight: 144 lb 8 oz (65.545 kg)    GEN- The patient is well appearing, alert and oriented x 3 today.   Head- normocephalic, atraumatic Eyes-  Sclera clear, conjunctiva pink Ears- hearing intact Oropharynx- clear Lungs- Clear to ausculation bilaterally, normal work of breathing Chest- PPM pocket is well healed Heart- Regular rate and rhythm, no murmurs, rubs or gallops, PMI not laterally displaced GI- soft, NT, ND, + BS Extremities- no clubbing, cyanosis, or edema  PPM interrogation- reviewed in detail today,  See PACEART  report  Assessment and Plan:  1. Complete heart block Normal pacemaker function See Pace Art report No changes today  2. HTN Stable No change required today  Followed remotely with merlin Return to see me in 1 year

## 2014-09-17 ENCOUNTER — Encounter: Payer: Self-pay | Admitting: Internal Medicine

## 2014-09-17 ENCOUNTER — Ambulatory Visit (INDEPENDENT_AMBULATORY_CARE_PROVIDER_SITE_OTHER): Payer: Medicare Other | Admitting: Internal Medicine

## 2014-09-17 VITALS — BP 128/64 | HR 68 | Ht 60.5 in | Wt 147.2 lb

## 2014-09-17 DIAGNOSIS — K219 Gastro-esophageal reflux disease without esophagitis: Secondary | ICD-10-CM | POA: Diagnosis not present

## 2014-09-17 DIAGNOSIS — K2270 Barrett's esophagus without dysplasia: Secondary | ICD-10-CM | POA: Diagnosis not present

## 2014-09-17 MED ORDER — RANITIDINE HCL 150 MG PO TABS: 150.0000 mg | ORAL_TABLET | Freq: Every day | ORAL | Status: DC

## 2014-09-17 NOTE — Patient Instructions (Signed)
Today you have been given a printed rx for generic Zantac to use when your Nexium rx runs out .   We will put a recall in the system for your EGD for December 2016.    I appreciate the opportunity to care for you.

## 2014-09-17 NOTE — Progress Notes (Signed)
Carmen Green 01/16/1951 [id number]  Note: This dictation was prepared with Dragon digital system. Any transcriptional errors that result from this procedure are unintentional.   History of Present Illness: This is a 64 year old white female who underwent  segmental sigmoid resection and left oophorectomy  for perforated diverticulitis in May 2015 and has slowly recovered to her baseline. She is  still on MiraLAX half cap full every day  and she denies abdominal pain , difficulty with evacuation or rectal bleeding. Last colonoscopy in June 2012 showed benign  sigmoid stricture and severe diverticulosis. She is here today to follow-up on Barrett's esophagus which was diagnosed in 2010 but not confirmed on last endoscopy in June 2012. She is currently on Nexium 40 mg daily but it has become rather expensive and she is asking to switch to a cheaper medication. She denies heartburn dysphagia nocturnal cough or hoarseness.    Past Medical History  Diagnosis Date  . Hypertension   . Complete heart block   . Hyperlipidemia     s/p PPM implant by Dr Olevia Perches 1992 with generator change 2004 (SJM), she is device dependant  . Mitral valve prolapse   . Anxiety   . Hiatal hernia   . GERD (gastroesophageal reflux disease)   . Colon polyp   . Esophagitis   . Diverticulitis   . Pacemaker   . Heart murmur   . H/O seasonal allergies   . Arthritis   . Contusion of chest wall 10/10/2007    Centricity Description: CHEST WALL CONTUSION Qualifier: Diagnosis of  By: Aline Brochure MD, Dorothyann Peng   Centricity Description: CONTUSION, RIGHT RIB Qualifier: Diagnosis of  By: Aline Brochure MD, Dorothyann Peng    . ESOPHAGITIS 06/25/2008    Qualifier: History of  By: Harlon Ditty CMA (AAMA), Dottie      Past Surgical History  Procedure Laterality Date  . Left ovary cyst removal  1972  . Pacemaker insertion  1992, 2004, 2012    device dependant  . Tubal ligation    . Colonoscopy  10/19/2004    LEC  . Polypectomy    . Appendectomy     . Breast surgery Right     biopsy  . Eye surgery Left     cataract extraction with IOL  . Laparoscopic low anterior resection  11/16/2013    Robotic  . Robotic assisted salpingo oopherectomy Left 11/16/2013    Robotic en bloc w LAR resection  . Proctoscopy  11/15/2013    Procedure: RIGID PROCTOSCOPY;  Surgeon: Adin Hector, MD;  Location: [facility];  Service: General;;  . Laparoscopic lysis of adhesions N/A 11/15/2013    Procedure: LAPAROSCOPIC LYSIS OF ADHESIONS;  Surgeon: Adin Hector, MD;  Location: [facility];  Service: General;  Laterality: N/A;    Allergies  Allergen Reactions  . Cephalexin Rash  . Oxycodone     "Loopy"  . Penicillins Rash    Family history and social history have been reviewed.  Review of Systems: Denies dysphagia, heartburn chest pain. Positive for weight loss with recent modest weight gain  The remainder of the 10 point ROS is negative except as outlined in the H&P  Physical Exam: General Appearance Well developed, in no distress Eyes  Non icteric  HEENT  Non traumatic, normocephalic  Mouth No lesion, tongue papillated, no cheilosis Neck Supple without adenopathy, thyroid not enlarged, no carotid bruits, no JVD Lungs Clear to auscultation bilaterally COR Normal S1, normal S2, regular rhythm, no murmur, quiet precordium Abdomen well-healed surgical scar in  suprapubic area. Normal active bowel sounds. No tenderness. No mass Rectal not done Extremities  No pedal edema Skin No lesions Neurological Alert and oriented x 3 Psychological Normal mood and affect  Assessment and Plan:   64 year old white female one year post  sigmoid resection and left oophorectomy for  diverticulitis and stricture. She is doing very well. She will reduce MiraLAX to every other day. She is up-to-date on colonoscopy. If symptoms continue she may need flexible sigmoidoscopy to rule out t anastomotic sigmoid stricture  Gastroesophageal reflux disease with history of Barrett's  esophagus not confirmed on last endoscopy. Suggest repeat endoscopy at the end of this year. We will switch to ranitidine 150 mg at bedtime she and will continue antireflux measures     Delfin Edis 09/17/2014

## 2014-09-30 ENCOUNTER — Ambulatory Visit (INDEPENDENT_AMBULATORY_CARE_PROVIDER_SITE_OTHER): Payer: Medicare Other | Admitting: *Deleted

## 2014-09-30 DIAGNOSIS — I442 Atrioventricular block, complete: Secondary | ICD-10-CM | POA: Diagnosis not present

## 2014-09-30 LAB — MDC_IDC_ENUM_SESS_TYPE_REMOTE
Battery Remaining Longevity: 70 mo
Battery Voltage: 2.95 V
Brady Statistic AP VP Percent: 13 %
Brady Statistic AS VP Percent: 86 %
Brady Statistic AS VS Percent: 1 %
Brady Statistic RA Percent Paced: 13 %
Implantable Pulse Generator Model: 2210
Lead Channel Impedance Value: 510 Ohm
Lead Channel Pacing Threshold Amplitude: 1.5 V
Lead Channel Pacing Threshold Pulse Width: 0.5 ms
Lead Channel Sensing Intrinsic Amplitude: 3.6 mV
Lead Channel Setting Sensing Sensitivity: 4 mV
MDC IDC MSMT BATTERY REMAINING PERCENTAGE: 74 %
MDC IDC MSMT LEADCHNL RA IMPEDANCE VALUE: 430 Ohm
MDC IDC MSMT LEADCHNL RV SENSING INTR AMPL: 11 mV
MDC IDC PG SERIAL: 7252855
MDC IDC SESS DTM: 20160411071230
MDC IDC SET LEADCHNL RA PACING AMPLITUDE: 3 V
MDC IDC SET LEADCHNL RV PACING AMPLITUDE: 1.75 V
MDC IDC SET LEADCHNL RV PACING PULSEWIDTH: 0.5 ms
MDC IDC STAT BRADY AP VS PERCENT: 1 %
MDC IDC STAT BRADY RV PERCENT PACED: 99 %

## 2014-09-30 NOTE — Progress Notes (Signed)
Remote pacemaker transmission.   

## 2014-10-10 ENCOUNTER — Encounter: Payer: Self-pay | Admitting: Cardiology

## 2014-10-15 ENCOUNTER — Encounter: Payer: Self-pay | Admitting: Internal Medicine

## 2014-10-17 ENCOUNTER — Encounter: Payer: Self-pay | Admitting: Cardiology

## 2014-10-17 ENCOUNTER — Ambulatory Visit (INDEPENDENT_AMBULATORY_CARE_PROVIDER_SITE_OTHER): Payer: Medicare Other | Admitting: Cardiology

## 2014-10-17 VITALS — BP 128/70 | HR 78 | Ht 60.5 in | Wt 148.0 lb

## 2014-10-17 DIAGNOSIS — I442 Atrioventricular block, complete: Secondary | ICD-10-CM

## 2014-10-17 DIAGNOSIS — I059 Rheumatic mitral valve disease, unspecified: Secondary | ICD-10-CM

## 2014-10-17 DIAGNOSIS — R5383 Other fatigue: Secondary | ICD-10-CM | POA: Diagnosis not present

## 2014-10-17 DIAGNOSIS — I340 Nonrheumatic mitral (valve) insufficiency: Secondary | ICD-10-CM | POA: Diagnosis not present

## 2014-10-17 LAB — VITAMIN B12: VITAMIN B 12: 777 pg/mL (ref 211–911)

## 2014-10-17 NOTE — Patient Instructions (Signed)
Medication Instructions:  No changes.  Labwork: B12 level today   Testing/Procedures: Your physician has requested that you have an echocardiogram. Echocardiography is a painless test that uses sound waves to create images of your heart. It provides your doctor with information about the size and shape of your heart and how well your heart's chambers and valves are working. This procedure takes approximately one hour. There are no restrictions for this procedure. April 2017   Follow-Up: Your physician wants you to follow-up in: 1 year with Dr Aundra Dubin. (April 2017). You will receive a reminder letter in the mail two months in advance. If you don't receive a letter, please call our office to schedule the follow-up appointment.

## 2014-10-17 NOTE — Progress Notes (Signed)
Patient ID: Carmen Green, female   DOB: 1951-06-15, 64 y.o.   MRN: [id number] PCP: Dr. Melina Copa  64 yo with history of pacemaker for AV block and MV prolapse presents for cardiology followup.  Last echo in 4/14 showed EF 55-60% with MV prolapse and mild MR.  Since last appointment, she had a partial colectomy in 5/15 for recurrent diverticulitis.   No chest pain. Her stamina is still not completely back after surgery but she is working in her yard and walking for exercise.  No significant dyspnea.   No orthopnea or PND.    ECG: NSR, v-paced  Labs (1/16): K 4.6, creatinine 0.72, LDL 108, HDL 49, TSH normal, HCT 37.5  Allergies:  1) ! Keflex  2) ! Pcn   Past Medical History:  1. 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  2. MITRAL VALVE PROLAPSE: Echo (12/11) with EF 78-29%, grade I diastolic dysfunction, mild MR with mitral valve prolapse, mild left atrial enlargement, asynchronous septum due to pacing.  Echo (4/14) with EF 55-60%, mild LVH, MV prolapse with mild MR, septal paradox with RV pacing.  3. HYPERLIPIDEMIA  4. HYPERTENSION  5. ANXIETY  6. HIATAL HERNIA 7. GERD 8. COLONIC POLYPS 9. Hx of ESOPHAGITIS   10. LHC (5/09): no coronary disease  11. Diverticulitis: Multiple episodes, 2 in 2015, 1 in 2012. Patient had partial colectomy in 5/15 for recurrent diverticulitis.   Family History:  Family History of Diabetes: Mother, Grandmother  Family History of Heart Disease: Maternal Grandmother, Maternal Aunt  Mother with CVA  Social History:  Illicit Drug Use - no  Patient has never smoked.  Alcohol Use - no  Patient does not get regular exercise.  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 busPIRone (BUSPAR) 10 MG tablet Take 10 mg by mouth 3 (three) times daily.      . cetirizine (ZYRTEC) 10 MG tablet Take 10 mg by mouth daily as needed for allergies.     Marland Kitchen esomeprazole  (NEXIUM) 40 MG capsule Take 1 capsule (40 mg total) by mouth daily. 180 capsule 0  . estradiol (ESTRACE) 0.5 MG tablet Take 0.5 mg by mouth daily.    . fluticasone (FLONASE) 50 MCG/ACT nasal spray Place 1 spray into the nose daily as needed for allergies.     Marland Kitchen guaiFENesin (MUCINEX) 600 MG 12 hr tablet Take 1,200 mg by mouth 2 (two) times daily as needed for cough.     Marland Kitchen ibuprofen (ADVIL,MOTRIN) 400 MG tablet Take 1-2 tablets (400-800 mg total) by mouth every 6 (six) hours as needed for fever, headache, mild pain or moderate pain. 40 tablet 1  . lisinopril-hydrochlorothiazide (PRINZIDE,ZESTORETIC) 20-12.5 MG per tablet Take 1 tablet by mouth every morning.     Marland Kitchen LORazepam (ATIVAN) 0.5 MG tablet Take 0.5 mg by mouth at bedtime.     . medroxyPROGESTERone (PROVERA) 2.5 MG tablet Take 2.5 mg by mouth daily.    . Multiple Vitamin (MULTIVITAMIN) tablet Take 1 tablet by mouth daily.      . ranitidine (ZANTAC) 150 MG tablet Take 1 tablet (150 mg total) by mouth at bedtime. 90 tablet 3  . rosuvastatin (CRESTOR) 10 MG tablet Take 10 mg by mouth every morning.      No current facility-administered medications for this visit.    BP 128/70 mmHg  Pulse 78  Ht 5' 0.5" (1.537 m)  Wt 148  lb (67.132 kg)  BMI 28.42 kg/m2 General: NAD Neck: No JVD, no thyromegaly or thyroid nodule.  Lungs: Clear to auscultation bilaterally with normal respiratory effort. CV: Nondisplaced PMI.  Heart regular S1/S2, no O7/S9, mid systolic click with 1/6 HSM at apex.  No peripheral edema.  No carotid bruit.  Normal pedal pulses.  Abdomen: Soft, nontender, no hepatosplenomegaly, no distention.  Neurologic: Alert and oriented x 3.  Psych: Normal affect. Extremities: No clubbing or cyanosis.   Assessment/Plan: 1. St Jude PCM: Gets regular PCM followup.  2. Mitral regurgitation: Mild MR due to MV prolapse on 4/14 echo.  Stable symptoms.  Would consider repeating echo next year.  3. HTN: BP seems well-controlled at this time.   4. Hyperlipidemia: Lipids ok in 1/16.    Loralie Champagne 10/17/2014

## 2014-10-24 ENCOUNTER — Encounter: Payer: Self-pay | Admitting: Internal Medicine

## 2014-12-31 ENCOUNTER — Ambulatory Visit (INDEPENDENT_AMBULATORY_CARE_PROVIDER_SITE_OTHER): Payer: Medicare Other | Admitting: *Deleted

## 2014-12-31 ENCOUNTER — Encounter: Payer: Self-pay | Admitting: Internal Medicine

## 2014-12-31 DIAGNOSIS — I442 Atrioventricular block, complete: Secondary | ICD-10-CM | POA: Diagnosis not present

## 2014-12-31 NOTE — Progress Notes (Signed)
Remote pacemaker transmission.   

## 2015-01-03 LAB — CUP PACEART REMOTE DEVICE CHECK
Battery Remaining Longevity: 85 mo
Battery Remaining Percentage: 91 %
Brady Statistic AP VP Percent: 13 %
Brady Statistic AS VS Percent: 1 %
Brady Statistic RV Percent Paced: 99 %
Lead Channel Impedance Value: 440 Ohm
Lead Channel Pacing Threshold Amplitude: 1.5 V
Lead Channel Pacing Threshold Pulse Width: 0.5 ms
Lead Channel Sensing Intrinsic Amplitude: 11.1 mV
Lead Channel Sensing Intrinsic Amplitude: 3.7 mV
Lead Channel Setting Pacing Amplitude: 3 V
Lead Channel Setting Pacing Pulse Width: 0.5 ms
Lead Channel Setting Sensing Sensitivity: 4 mV
MDC IDC MSMT BATTERY VOLTAGE: 2.95 V
MDC IDC MSMT LEADCHNL RV IMPEDANCE VALUE: 510 Ohm
MDC IDC SESS DTM: 20160712074651
MDC IDC SET LEADCHNL RV PACING AMPLITUDE: 1.75 V
MDC IDC STAT BRADY AP VS PERCENT: 1 %
MDC IDC STAT BRADY AS VP PERCENT: 86 %
MDC IDC STAT BRADY RA PERCENT PACED: 13 %
Pulse Gen Model: 2210
Pulse Gen Serial Number: 7252855

## 2015-01-24 ENCOUNTER — Encounter: Payer: Self-pay | Admitting: Cardiology

## 2015-02-03 ENCOUNTER — Telehealth: Payer: Self-pay | Admitting: *Deleted

## 2015-02-03 NOTE — Telephone Encounter (Signed)
Called pt regarding high ventricular rate on 01-17-15. Pt does not recall symptoms around 8:15pm. No changes at this time, pt made aware.

## 2015-02-12 ENCOUNTER — Encounter: Payer: Self-pay | Admitting: Internal Medicine

## 2015-04-03 ENCOUNTER — Telehealth: Payer: Self-pay | Admitting: Cardiology

## 2015-04-03 ENCOUNTER — Telehealth: Payer: Self-pay | Admitting: Internal Medicine

## 2015-04-03 ENCOUNTER — Encounter: Payer: Medicare Other | Admitting: *Deleted

## 2015-04-03 NOTE — Telephone Encounter (Signed)
New message     Pt is due to send remote transmission today.  She is having problems and the monitor people will send her another cord.  Calling to let you know that it will be a few days before she can send transmission

## 2015-04-03 NOTE — Telephone Encounter (Signed)
Spoke with pt and reminded pt of remote transmission that is due today. Pt stated that she is getting a new monitor and she will send transmission once she receives the new monitor.

## 2015-04-03 NOTE — Telephone Encounter (Signed)
Pt will send when she receives new equipment.

## 2015-04-07 ENCOUNTER — Encounter: Payer: Self-pay | Admitting: Cardiology

## 2015-04-11 ENCOUNTER — Telehealth: Payer: Self-pay | Admitting: Internal Medicine
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
## 2015-04-11 NOTE — Telephone Encounter (Signed)
F/u  Pt requested to speak w/ RN- pt was to wait for equipment in order to send remote- pt has received it but says the monitor is still not working. Pt is wondering if she needs new monitor. Please call back and discuss

## 2015-04-15 ENCOUNTER — Ambulatory Visit (INDEPENDENT_AMBULATORY_CARE_PROVIDER_SITE_OTHER): Payer: Medicare Other | Admitting: *Deleted

## 2015-04-15 ENCOUNTER — Telehealth: Payer: Self-pay | Admitting: Internal Medicine

## 2015-04-15 DIAGNOSIS — I442 Atrioventricular block, complete: Secondary | ICD-10-CM | POA: Diagnosis not present

## 2015-04-15 NOTE — Telephone Encounter (Signed)
Returned patient's call.  Explained that transmission was received and that there are no alerts.  I also advised patient that she should receive a letter in the mail in the next few weeks stating that her device check was normal and listing the next remote date.  Patient denies any additional questions or concerns at this time.

## 2015-04-15 NOTE — Telephone Encounter (Signed)
New message      Calling to let device clinic know that she sent in a remote transmission today.  She received her new monitor.

## 2015-04-16 NOTE — Progress Notes (Signed)
Remote pacemaker transmission.   

## 2015-04-22 LAB — CUP PACEART REMOTE DEVICE CHECK
Battery Remaining Longevity: 88 mo
Battery Voltage: 2.95 V
Brady Statistic AP VP Percent: 13 %
Brady Statistic AP VS Percent: 1 %
Brady Statistic RV Percent Paced: 99 %
Date Time Interrogation Session: 20161025171943
Implantable Lead Implant Date: 19920319
Implantable Lead Location: 753859
Lead Channel Impedance Value: 540 Ohm
Lead Channel Pacing Threshold Amplitude: 1.25 V
Lead Channel Pacing Threshold Pulse Width: 0.7 ms
Lead Channel Sensing Intrinsic Amplitude: 12 mV
Lead Channel Setting Pacing Pulse Width: 0.5 ms
Lead Channel Setting Sensing Sensitivity: 4 mV
MDC IDC LEAD IMPLANT DT: 19920319
MDC IDC LEAD LOCATION: 753860
MDC IDC MSMT BATTERY REMAINING PERCENTAGE: 91 %
MDC IDC MSMT LEADCHNL RA IMPEDANCE VALUE: 490 Ohm
MDC IDC MSMT LEADCHNL RA PACING THRESHOLD AMPLITUDE: 1.5 V
MDC IDC MSMT LEADCHNL RA SENSING INTR AMPL: 2.6 mV
MDC IDC MSMT LEADCHNL RV PACING THRESHOLD PULSEWIDTH: 0.5 ms
MDC IDC SET LEADCHNL RA PACING AMPLITUDE: 3 V
MDC IDC SET LEADCHNL RV PACING AMPLITUDE: 1.5 V
MDC IDC STAT BRADY AS VP PERCENT: 87 %
MDC IDC STAT BRADY AS VS PERCENT: 1 %
MDC IDC STAT BRADY RA PERCENT PACED: 13 %
Pulse Gen Model: 2210
Pulse Gen Serial Number: 7252855

## 2015-04-23 ENCOUNTER — Encounter: Payer: Self-pay | Admitting: Cardiology

## 2015-06-12 ENCOUNTER — Other Ambulatory Visit: Payer: Self-pay

## 2015-06-12 DIAGNOSIS — Z1231 Encounter for screening mammogram for malignant neoplasm of breast: Secondary | ICD-10-CM

## 2015-06-19 ENCOUNTER — Telehealth: Payer: Self-pay | Admitting: Internal Medicine

## 2015-06-20 NOTE — Telephone Encounter (Signed)
Patient notified that Rollene Fare will call her back next week when she returns to the office

## 2015-06-24 NOTE — Telephone Encounter (Signed)
Spoke with patient and discussed new MD's . She will call back and schedule  OV after she discusses with her husband.

## 2015-07-11 ENCOUNTER — Encounter: Payer: Self-pay | Admitting: Internal Medicine

## 2015-07-11 ENCOUNTER — Other Ambulatory Visit: Payer: Self-pay

## 2015-07-11 ENCOUNTER — Ambulatory Visit (INDEPENDENT_AMBULATORY_CARE_PROVIDER_SITE_OTHER): Payer: Medicare Other | Admitting: Internal Medicine

## 2015-07-11 VITALS — BP 134/70 | HR 72 | Ht 61.5 in | Wt 149.4 lb

## 2015-07-11 DIAGNOSIS — I442 Atrioventricular block, complete: Secondary | ICD-10-CM | POA: Diagnosis not present

## 2015-07-11 DIAGNOSIS — R002 Palpitations: Secondary | ICD-10-CM | POA: Diagnosis not present

## 2015-07-11 DIAGNOSIS — I10 Essential (primary) hypertension: Secondary | ICD-10-CM

## 2015-07-11 DIAGNOSIS — I341 Nonrheumatic mitral (valve) prolapse: Secondary | ICD-10-CM

## 2015-07-11 NOTE — Patient Instructions (Signed)
Medication Instructions:  Your physician recommends that you continue on your current medications as directed. Please refer to the Current Medication list given to you today.  Labwork: None ordered  Testing/Procedures: Your physician has requested that you have an echocardiogram. Echocardiography is a painless test that uses sound waves to create images of your heart. It provides your doctor with information about the size and shape of your heart and how well your heart's chambers and valves are working. This procedure takes approximately one hour. There are no restrictions for this procedure.  Your physician has recommended that you wear an event monitor. Event monitors are medical devices that record the heart's electrical activity. Doctors most often [place] these monitors to diagnose arrhythmias. Arrhythmias are problems with the speed or rhythm of the heartbeat. The monitor is a small, portable device. You can wear one while you do your normal daily activities. This is usually used to diagnose what is causing palpitations/syncope (passing out).  Follow-Up: Remote monitoring is used to monitor your Pacemaker of ICD from home. This monitoring reduces the number of office visits required to check your device to one time per year. It allows [place] to keep an eye on the functioning of your device to ensure it is working properly. You are scheduled for a device check from home on 10/13/15. You may send your transmission at any time that day. If you have a wireless device, the transmission will be sent automatically. After your physician reviews your transmission, you will receive a postcard with your next transmission date.  Your physician wants you to follow-up in: 1 year with Dr. Rayann Heman. You will receive a reminder letter in the mail two months in advance. If you don't receive a letter, please call our office to schedule the follow-up appointment.  If you need a refill on your cardiac medications before your  next appointment, please call your pharmacy.  Thank you for choosing [facility]!!

## 2015-07-13 DIAGNOSIS — R002 Palpitations: Secondary | ICD-10-CM | POA: Insufficient documentation
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
## 2015-07-14 ENCOUNTER — Ambulatory Visit
Admission: RE | Admit: 2015-07-14 | Discharge: 2015-07-14 | Disposition: A | Discharge status: Home / Self Care | Payer: Medicare Other | Source: Ambulatory Visit

## 2015-07-14 DIAGNOSIS — Z1231 Encounter for screening mammogram for malignant neoplasm of breast: Secondary | ICD-10-CM

## 2015-07-17 ENCOUNTER — Encounter: Payer: Self-pay | Admitting: Gastroenterology

## 2015-07-17 LAB — CUP PACEART INCLINIC DEVICE CHECK
Battery Remaining Longevity: 41 mo
Battery Remaining Percentage: 73 %
Brady Statistic RA Percent Paced: 12 %
Date Time Interrogation Session: 20170126121510
Implantable Lead Implant Date: 19920319
Implantable Lead Location: 753859
Implantable Lead Location: 753860
Lead Channel Impedance Value: 440 Ohm
Lead Channel Pacing Threshold Amplitude: 1.125 V
Lead Channel Pacing Threshold Pulse Width: 1.5 ms
Lead Channel Sensing Intrinsic Amplitude: 10.5 mV
Lead Channel Sensing Intrinsic Amplitude: 2.8 mV
Lead Channel Setting Pacing Amplitude: 1.5 V
Lead Channel Setting Pacing Pulse Width: 0.5 ms
Lead Channel Setting Sensing Sensitivity: 4 mV
MDC IDC LEAD IMPLANT DT: 19920319
MDC IDC MSMT BATTERY VOLTAGE: 2.92 V
MDC IDC MSMT LEADCHNL RA PACING THRESHOLD AMPLITUDE: 3 V
MDC IDC MSMT LEADCHNL RV IMPEDANCE VALUE: 510 Ohm
MDC IDC MSMT LEADCHNL RV PACING THRESHOLD PULSEWIDTH: 0.5 ms
MDC IDC PG SERIAL: 7252855
MDC IDC SET LEADCHNL RA PACING AMPLITUDE: 4.5 V
MDC IDC STAT BRADY RV PERCENT PACED: 99 % — AB
Pulse Gen Model: 2210

## 2015-07-24 ENCOUNTER — Ambulatory Visit (INDEPENDENT_AMBULATORY_CARE_PROVIDER_SITE_OTHER): Payer: Medicare Other

## 2015-07-24 ENCOUNTER — Ambulatory Visit (HOSPITAL_COMMUNITY): Payer: Medicare Other | Attending: Cardiovascular Disease

## 2015-07-24 DIAGNOSIS — R002 Palpitations: Secondary | ICD-10-CM

## 2015-07-24 DIAGNOSIS — I517 Cardiomegaly: Secondary | ICD-10-CM | POA: Diagnosis not present

## 2015-07-24 DIAGNOSIS — I340 Nonrheumatic mitral (valve) insufficiency: Secondary | ICD-10-CM | POA: Insufficient documentation

## 2015-07-24 DIAGNOSIS — I341 Nonrheumatic mitral (valve) prolapse: Secondary | ICD-10-CM | POA: Insufficient documentation

## 2015-10-08 DIAGNOSIS — H43812 Vitreous degeneration, left eye: Secondary | ICD-10-CM | POA: Diagnosis not present

## 2015-10-08 DIAGNOSIS — Z961 Presence of intraocular lens: Secondary | ICD-10-CM | POA: Diagnosis not present

## 2015-10-08 DIAGNOSIS — H10413 Chronic giant papillary conjunctivitis, bilateral: Secondary | ICD-10-CM | POA: Diagnosis not present

## 2015-10-09 DIAGNOSIS — J40 Bronchitis, not specified as acute or chronic: Secondary | ICD-10-CM | POA: Diagnosis not present

## 2015-10-09 DIAGNOSIS — J309 Allergic rhinitis, unspecified: Secondary | ICD-10-CM | POA: Diagnosis not present

## 2015-10-16 ENCOUNTER — Ambulatory Visit (INDEPENDENT_AMBULATORY_CARE_PROVIDER_SITE_OTHER): Payer: Medicare Other | Admitting: *Deleted

## 2015-10-16 DIAGNOSIS — I442 Atrioventricular block, complete: Secondary | ICD-10-CM

## 2015-10-16 NOTE — Progress Notes (Signed)
Remote pacemaker transmission.   

## 2015-11-26 ENCOUNTER — Encounter: Payer: Self-pay | Admitting: Cardiology

## 2015-11-27 LAB — CUP PACEART REMOTE DEVICE CHECK
Battery Remaining Percentage: 81 %
Battery Voltage: 2.93 V
Brady Statistic AP VP Percent: 1 %
Brady Statistic AS VP Percent: 99 %
Brady Statistic AS VS Percent: 1 %
Brady Statistic RA Percent Paced: 1 %
Brady Statistic RV Percent Paced: 99 %
Implantable Lead Implant Date: 19920319
Implantable Lead Location: 753859
Implantable Lead Location: 753860
Lead Channel Impedance Value: 540 Ohm
Lead Channel Pacing Threshold Pulse Width: 0.5 ms
Lead Channel Sensing Intrinsic Amplitude: 2.9 mV
Lead Channel Setting Pacing Amplitude: 4.5 V
Lead Channel Setting Pacing Pulse Width: 0.5 ms
MDC IDC LEAD IMPLANT DT: 19920319
MDC IDC MSMT BATTERY REMAINING LONGEVITY: 76 mo
MDC IDC MSMT LEADCHNL RA IMPEDANCE VALUE: 440 Ohm
MDC IDC MSMT LEADCHNL RV PACING THRESHOLD AMPLITUDE: 1 V
MDC IDC MSMT LEADCHNL RV SENSING INTR AMPL: 8.6 mV
MDC IDC SESS DTM: 20170424072631
MDC IDC SET LEADCHNL RV PACING AMPLITUDE: 1.25 V
MDC IDC SET LEADCHNL RV SENSING SENSITIVITY: 4 mV
MDC IDC STAT BRADY AP VS PERCENT: 1 %
Pulse Gen Serial Number: 7252855

## 2016-01-12 DIAGNOSIS — N951 Menopausal and female climacteric states: Secondary | ICD-10-CM | POA: Diagnosis not present

## 2016-01-12 DIAGNOSIS — E789 Disorder of lipoprotein metabolism, unspecified: Secondary | ICD-10-CM | POA: Diagnosis not present

## 2016-01-12 DIAGNOSIS — F329 Major depressive disorder, single episode, unspecified: Secondary | ICD-10-CM | POA: Diagnosis not present

## 2016-01-12 DIAGNOSIS — I10 Essential (primary) hypertension: Secondary | ICD-10-CM | POA: Diagnosis not present

## 2016-01-15 ENCOUNTER — Ambulatory Visit (INDEPENDENT_AMBULATORY_CARE_PROVIDER_SITE_OTHER): Payer: Medicare Other | Admitting: *Deleted

## 2016-01-15 DIAGNOSIS — I442 Atrioventricular block, complete: Secondary | ICD-10-CM | POA: Diagnosis not present

## 2016-01-15 LAB — CUP PACEART REMOTE DEVICE CHECK
Battery Remaining Percentage: 73 %
Battery Voltage: 2.92 V
Brady Statistic AP VP Percent: 1 %
Brady Statistic AP VS Percent: 1 %
Brady Statistic AS VP Percent: 99 %
Brady Statistic RA Percent Paced: 1 %
Date Time Interrogation Session: 20170727075345
Implantable Lead Location: 753860
Lead Channel Impedance Value: 460 Ohm
Lead Channel Impedance Value: 540 Ohm
Lead Channel Sensing Intrinsic Amplitude: 3.2 mV
Lead Channel Sensing Intrinsic Amplitude: 9.5 mV
Lead Channel Setting Pacing Pulse Width: 0.5 ms
MDC IDC LEAD IMPLANT DT: 19920319
MDC IDC LEAD IMPLANT DT: 19920319
MDC IDC LEAD LOCATION: 753859
MDC IDC MSMT BATTERY REMAINING LONGEVITY: 69 mo
MDC IDC MSMT LEADCHNL RV PACING THRESHOLD AMPLITUDE: 1.375 V
MDC IDC MSMT LEADCHNL RV PACING THRESHOLD PULSEWIDTH: 0.5 ms
MDC IDC SET LEADCHNL RA PACING AMPLITUDE: 4.5 V
MDC IDC SET LEADCHNL RV PACING AMPLITUDE: 1.625
MDC IDC SET LEADCHNL RV SENSING SENSITIVITY: 4 mV
MDC IDC STAT BRADY AS VS PERCENT: 1 %
MDC IDC STAT BRADY RV PERCENT PACED: 99 %
Pulse Gen Model: 2210
Pulse Gen Serial Number: 7252855

## 2016-01-15 NOTE — Progress Notes (Signed)
Remote pacemaker transmission.   

## 2016-01-16 ENCOUNTER — Encounter: Payer: Self-pay | Admitting: Cardiology

## 2016-02-10 ENCOUNTER — Encounter: Payer: Self-pay | Admitting: Gastroenterology

## 2016-02-10 ENCOUNTER — Ambulatory Visit (INDEPENDENT_AMBULATORY_CARE_PROVIDER_SITE_OTHER): Payer: Medicare Other | Admitting: Gastroenterology

## 2016-02-10 VITALS — BP 132/68 | HR 80 | Ht 61.0 in | Wt 154.2 lb

## 2016-02-10 DIAGNOSIS — K219 Gastro-esophageal reflux disease without esophagitis: Secondary | ICD-10-CM | POA: Diagnosis not present

## 2016-02-10 DIAGNOSIS — R32 Unspecified urinary incontinence: Secondary | ICD-10-CM | POA: Diagnosis not present

## 2016-02-10 DIAGNOSIS — R1310 Dysphagia, unspecified: Secondary | ICD-10-CM | POA: Diagnosis not present

## 2016-02-10 MED ORDER — OMEPRAZOLE 40 MG PO CPDR: 40.0000 mg | DELAYED_RELEASE_CAPSULE | Freq: Every day | ORAL | 11 refills | Status: DC

## 2016-02-10 NOTE — Patient Instructions (Signed)
You have been scheduled for an endoscopy. Please follow written instructions given to you at your visit today. If you use inhalers (even only as needed), please bring them with you on the day of your procedure. Your physician has requested that you go to www.startemmi.com and enter the access code given to you at your visit today. This web site gives a general overview about your procedure. However, you should still follow specific instructions given to you by our office regarding your preparation for the procedure.  We will send omeprazole to your pharmacy  We will refer you to [facility] and will contact you with that appointment

## 2016-02-11 NOTE — Progress Notes (Incomplete)
Carmen Green is a 65 y.o. female who presents for a school sports physical exam. Patient/parent deny any current health related concerns. She plans to participate in ***.  Personal history -Exertional chest pain/discomfort*** -Unexplained syncope/near-syncope*** -Excessive exertional and unexplained dyspnea/fatigue associated with exercise*** -Prior recognition of a heart murmur*** -Elevated systemic blood pressure***  Current diet: *** Balanced diet? {yes/no***:64}  Current concerns include ***. Currently menstruating? {yes/no/not applicable:19512} Sexually active? {yes***/no:17258} Does patient snore? {yes***/no:17258}  Risk factors for anemia: {yes***/no:17258::"no"} Risk factors for vision problems: {yes***/no:17258::"no"} Risk factors for hearing problems: {yes***/no:17258::"no"} Risk factors for tuberculosis: {yes***/no:17258::"no"} Risk factors for dyslipidemia: {yes***/no:17258::"no"} Risk factors for sexually-transmitted infections: {yes***/no:17258::"no"}  Family history -Premature death (sudden and unexpected, or otherwise) before age 78 years due to heart disease in first-degree relative*** -Disability from heart disease in a close relative younger than 67 years of age*** -Specific knowledge of certain cardiac conditions in family members: hypertrophic or dilated cardiomyopathy, long QT syndrome and other ion channelopathies, Marfan syndrome, and clinically important arrhythmias***  Social history Do you feel stressed out or under a lot of pressure? Do you ever feel sad, hopeless, depressed, or anxious? Do you feel safe at your home or residence? Have you ever tried cigarettes, chewing tobacco, snuff, or dip? During the past 30 days, did you use chewing tobacco, snuff, or dip? Do you drink alcohol or use any other drugs? Have you ever taken anabolic steroids or used any other performance supplement? Have you ever taken any supplements to help you gain or lose  weight or improve your performance? Do you wear a seat belt, use a helmet, and use condoms?  Parental relations: *** Sibling relations: {siblings:16573} Discipline concerns? {yes***/no:17258} Concerns regarding behavior with peers? {yes***/no:17258} School performance: {performance:16655} Secondhand smoke exposure? {yes***/no:17258}    There is no immunization history on file for this patient.  {Common ambulatory SmartLinks:19316}  Review of Systems  {ros; complete peds:18097}     Wt Readings from Last 3 Encounters:  02/10/16 154 lb 3.2 oz (69.9 kg)  07/11/15 149 lb 6.4 oz (67.8 kg)  10/17/14 148 lb (67.1 kg)   Ht Readings from Last 3 Encounters:  02/10/16 5\' 1"  (1.549 m)  07/11/15 5' 1.5" (1.562 m)  10/17/14 5' 0.5" (1.537 m)   Body mass index is 29.14 kg/m. @BMIFA @ Facility age limit for growth percentiles is 20 years. Facility age limit for growth percentiles is 20 years. Growth percentile SmartLinks can only be used for patients less than 64 years old. @VISACUITY @ Vitals:   02/10/16 1017  BP: 132/68  Pulse: 80  Weight: 154 lb 3.2 oz (69.9 kg)  Height: 5\' 1"  (1.549 m)    GEN: awake, alert, well-nourished EYES: EOMI, sclera clear, no icterus  {exam; complete peds:30714}        Satisfactory school sports physical exam.        Permission granted to participate in athletics without restrictions. Form signed and returned to patient.  1. Anticipatory guidance discussed. {guidance:16882}  2. Weight management: The patient was counseled regarding {PED MU OBESITY COUNSELING:19964}.  3. Development: {desc; development appropriate/delayed:19200}  4. Immunizations today: per orders. History of previous adverse reactions to immunizations? {yes***/no:17258::"no"}  5. Follow-up visit in {1-6:10304::"1"} {week/month/year:19499::"year"} for next well child visit, or sooner as needed.

## 2016-02-11 NOTE — Progress Notes (Signed)
Carmen Green    [id number]    Feb 21, 1951  Primary Care Physician:Angel Adah Salvage, PA  Referring Physician: Octavio Graves, DO [address] [place], [place] [address]  Chief complaint:  GERD, intermittent dysphagia  HPI: 65 year old female previously followed by Dr. Olevia Perches last seen in office visit in March 2016 here for follow-up visit. She has long-standing history of GERD and also severe sigmoid diverticulosis with perforated diverticulitis and is status post low anterior resection, left salpingo-oophorectomy and lysis of adhesions on 11/15/2013. She is here today to follow-up on Barrett's esophagus which was diagnosed in 2010 but not confirmed on last endoscopy in June 2012 GERD symptoms are well controlled on Nexium 20 mg daily, is currently getting it over-the-counter as the prescription Nexium's expensive. She has intermittent solid food dysphagia with Meat or bread. No difficulty with liquids. Denies nausea, vomiting, odynophagia, abdominal pain, melena or blood per rectum. She continues to take MiraLAX one capful daily with daily bowel movements. She complains of urinary incontinence and no history of fecal incontinence  Last colonoscopy in June 2012 showed benign  sigmoid stricture and severe diverticulosis.   Outpatient Encounter Prescriptions as of 02/10/2016  Medication Sig  . buPROPion (WELLBUTRIN XL) 150 MG 24 hr tablet Take 300 mg by mouth daily.  . busPIRone (BUSPAR) 10 MG tablet Take 10 mg by mouth 3 (three) times daily.    . cetirizine (ZYRTEC) 10 MG tablet Take 10 mg by mouth daily as needed for allergies.   Marland Kitchen esomeprazole (NEXIUM) 20 MG capsule Take 20 mg by mouth daily at 12 noon.  Marland Kitchen estradiol (ESTRACE) 0.5 MG tablet Take 0.5 mg by mouth daily.  . fluticasone (FLONASE) 50 MCG/ACT nasal spray Place 1 spray into the nose daily as needed for allergies.   Marland Kitchen guaiFENesin (MUCINEX) 600 MG 12 hr tablet Take 1,200 mg by mouth 2 (two) times daily as needed for cough.     Marland Kitchen ibuprofen (ADVIL,MOTRIN) 400 MG tablet Take 1-2 tablets (400-800 mg total) by mouth every 6 (six) hours as needed for fever, headache, mild pain or moderate pain.  Marland Kitchen lisinopril-hydrochlorothiazide (PRINZIDE,ZESTORETIC) 20-12.5 MG per tablet Take 1 tablet by mouth every morning.   Marland Kitchen LORazepam (ATIVAN) 0.5 MG tablet Take 0.5 mg by mouth at bedtime.   . medroxyPROGESTERone (PROVERA) 2.5 MG tablet Take 2.5 mg by mouth daily.  . Multiple Vitamin (MULTIVITAMIN) tablet Take 1 tablet by mouth daily.    Marland Kitchen omeprazole (PRILOSEC) 40 MG capsule Take 1 capsule (40 mg total) by mouth daily.  . [DISCONTINUED] rosuvastatin (CRESTOR) 10 MG tablet Take 10 mg by mouth every morning.   . [DISCONTINUED] VESICARE 5 MG tablet Take 1 tablet by mouth daily.   No facility-administered encounter medications on file as of 02/10/2016.     Allergies as of 02/10/2016 - Review Complete 02/10/2016  Allergen Reaction Noted  . Amoxapine and related  07/11/2015  . Cephalexin Rash 10/10/2007  . Oxycodone  12/05/2013  . Penicillins Rash 10/10/2007    Past Medical History:  Diagnosis Date  . Anxiety   . Arthritis   . Colon polyp   . Complete heart block ([facility])   . Contusion of chest wall 10/10/2007   Centricity Description: CHEST WALL CONTUSION Qualifier: Diagnosis of  By: Aline Brochure MD, Dorothyann Peng   Centricity Description: CONTUSION, RIGHT RIB Qualifier: Diagnosis of  By: Aline Brochure MD, Dorothyann Peng    . Diverticulitis   . Esophagitis   . ESOPHAGITIS 06/25/2008  Qualifier: History of  By: Nelson-Smith CMA (AAMA), Dottie    . GERD (gastroesophageal reflux disease)   . H/O seasonal allergies   . Heart murmur   . Hiatal hernia   . 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 Hypertension   . Mitral valve prolapse   . Pacemaker     Past Surgical History:  Procedure Laterality Date  . APPENDECTOMY    . BREAST SURGERY Right    biopsy  . COLONOSCOPY  10/19/2004    LEC  . EYE SURGERY Left    cataract extraction with IOL  . LAPAROSCOPIC LOW ANTERIOR RESECTION  11/16/2013   Robotic  . LAPAROSCOPIC LYSIS OF ADHESIONS N/A 11/15/2013   Procedure: LAPAROSCOPIC LYSIS OF ADHESIONS;  Surgeon: Adin Hector, MD;  Location: [facility];  Service: General;  Laterality: N/A;  . left ovary cyst removal  1972  . [place], 2004, 2012   device dependant  . POLYPECTOMY    . PROCTOSCOPY  11/15/2013   Procedure: RIGID PROCTOSCOPY;  Surgeon: Adin Hector, MD;  Location: [facility];  Service: General;;  . ROBOTIC ASSISTED SALPINGO OOPHERECTOMY Left 11/16/2013   Robotic en bloc w LAR resection  . TUBAL LIGATION      Family History  Problem Relation Age of Onset  . Diabetes Mother   . Stroke Mother   . Diabetes Father   . Lung cancer Father   . Lung cancer Maternal Grandfather   . Heart disease Maternal Aunt   . Heart disease Maternal Grandmother   . Breast cancer Maternal Aunt   . Colon cancer Neg Hx     Social History   Social History  . Marital status: Married    Spouse name: N/A  . Number of children: 1  . Years of education: N/A   Occupational History  . REGISTRATION    Social History Main Topics  . Smoking status: Never Smoker  . Smokeless tobacco: Never Used  . Alcohol use No  . Drug use: No  . Sexual activity: Not on file   Other Topics Concern  . 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  HENT: Negative.   Eyes: Negative for blurred vision.  Respiratory: Negative for cough, shortness of breath and wheezing.   Cardiovascular: Negative for chest pain and palpitations.  Gastrointestinal: as per HPI Genitourinary: Negative for dysuria, urgency, frequency and hematuria.  positive for urinary incontinence Musculoskeletal: Negative for myalgias, back  pain and joint pain. Positive for joint swelling Skin: Negative for itching and rash.  Neurological: Negative for dizziness, tremors, focal weakness, seizures and loss of consciousness.  Endo/Heme/Allergies: Positive for seasonal allergies.  Psychiatric/Behavioral: Positive for depression, negative for suicidal ideas and hallucinations.  All other systems reviewed and are negative.   Physical Exam: Vitals:   02/10/16 1017  BP: 132/68  Pulse: 80   Body mass index is 29.14 kg/m. Gen:      No acute distress HEENT:  EOMI, sclera anicteric Neck:     No masses; no thyromegaly Lungs:    Clear to auscultation bilaterally; normal respiratory effort CV:         Regular rate and rhythm; no  murmurs, pacemaker  Abd:      + bowel sounds; soft, non-tender; no palpable masses, no distension Ext:    No edema; adequate peripheral perfusion Skin:      Warm and dry; no rash Neuro: alert and oriented x 3 Psych: normal mood and affect  Data Reviewed: Reviewed chart in epic   Assessment and Plan/Recommendations: 65 year old female with history of chronic GERD,  Esophageal stricture in 2010 s/p dilation with savary dilator to 76mm here with c/o intermittent solid dysphagia Schedule for EGD with possible dilation Reassured patient that she did not have any evidence of Barrett's esophagus based on last egd in 2012 and can reassess when we do egd for evaluation and management of dysphagia Continue PPI daily and antireflux measures Urinary incontinence and pelvic floor dysfunction will refer to pelvic PT  Due for recall colonoscopy in 2022  25 minutes was spent face-to-face with the patient. Greater than 50% of the time used for counseling as well as treatment plan and follow-up. She had multiple questions which were answered to her satisfaction  K. Denzil Magnuson , MD [phone] Mon-Fri 8a-5p [phone] after 5p, weekends, holidays  CC: Octavio Graves, DO

## 2016-02-12 ENCOUNTER — Ambulatory Visit (AMBULATORY_SURGERY_CENTER): Payer: Medicare Other | Admitting: Gastroenterology

## 2016-02-12 ENCOUNTER — Encounter: Payer: Self-pay | Admitting: Gastroenterology

## 2016-02-12 VITALS — BP 118/55 | HR 71 | Temp 98.6°F | Resp 15 | Ht 61.0 in | Wt 154.0 lb

## 2016-02-12 DIAGNOSIS — F341 Dysthymic disorder: Secondary | ICD-10-CM | POA: Diagnosis not present

## 2016-02-12 DIAGNOSIS — E669 Obesity, unspecified: Secondary | ICD-10-CM | POA: Diagnosis not present

## 2016-02-12 DIAGNOSIS — K219 Gastro-esophageal reflux disease without esophagitis: Secondary | ICD-10-CM | POA: Diagnosis not present

## 2016-02-12 DIAGNOSIS — R1310 Dysphagia, unspecified: Secondary | ICD-10-CM

## 2016-02-12 DIAGNOSIS — K222 Esophageal obstruction: Secondary | ICD-10-CM

## 2016-02-12 DIAGNOSIS — I10 Essential (primary) hypertension: Secondary | ICD-10-CM | POA: Diagnosis not present

## 2016-02-12 MED ORDER — SODIUM CHLORIDE 0.9 % IV SOLN: 500.0000 mL | INTRAVENOUS | Status: DC

## 2016-02-12 NOTE — Progress Notes (Signed)
Report to PACU, RN, vss, BBS= Clear.  

## 2016-02-12 NOTE — Op Note (Signed)
[facility] Patient Name: Carmen Green Procedure Date: 02/12/2016 10:18 AM MRN: [id number] Endoscopist: Mauri Pole , MD Age: 65 Referring MD:  Date of Birth: 07-Jan-1951 Gender: Female Account #: [id number] Procedure:                Upper GI endoscopy Indications:              Dysphagia, Esophageal reflux symptoms that recur                            despite appropriate therapy Medicines:                Monitored Anesthesia Care Procedure:                Pre-Anesthesia Assessment:                           - Prior to the procedure, a History and Physical                            was performed, and patient medications and                            allergies were reviewed. The patient's tolerance of                            previous anesthesia was also reviewed. The risks                            and benefits of the procedure and the sedation                            options and risks were discussed with the patient.                            All questions were answered, and informed consent                            was obtained. Prior Anticoagulants: The patient has                            taken no previous anticoagulant or antiplatelet                            agents. ASA Grade Assessment: II - A patient with                            mild systemic disease. After reviewing the risks                            and benefits, the patient was deemed in                            satisfactory condition to undergo the procedure.  After obtaining informed consent, the endoscope was                            passed under direct vision. Throughout the                            procedure, the patient's blood pressure, pulse, and                            oxygen saturations were monitored continuously. The                            Model GIF-HQ190 [id number]) scope was introduced                            through the mouth,  and advanced to the second part                            of duodenum. The upper GI endoscopy was                            accomplished without difficulty. The patient                            tolerated the procedure well. Scope In: Scope Out: Findings:                 The Z-line was regular.                           One mild (circumferential scarring)                            benign-appearing, intrinsic stenosis was found 32                            to 33 cm from the incisors. This measured greater                            than or equal to 3 cm (inner diameter) x less than                            one cm (in length) and was traversed easliy.                           The stomach was normal.                           The examined duodenum was normal. Complications:            No immediate complications. Estimated Blood Loss:     Estimated blood loss: none. Impression:               - Z-line regular.                           -  Benign-appearing mild wide open esophageal                            stenosis.                           - Normal stomach.                           - Normal examined duodenum.                           - No specimens collected. Recommendation:           - Patient has a contact number available for                            emergencies. The signs and symptoms of potential                            delayed complications were discussed with the                            patient. Return to normal activities tomorrow.                            Written discharge instructions were provided to the                            patient.                           - Resume previous diet.                           - Continue present medications.                           -Continue PPI and anti reflux measures                           - No repeat upper endoscopy.                           - Return to GI office PRN. Mauri Pole, MD 02/12/2016  10:38:43 AM This report has been signed electronically.

## 2016-02-12 NOTE — Patient Instructions (Signed)
Handout given on GERD/anti-reflux. Resume current medications. Continue current medications. Call us with any questions or concerns. Thank you!  YOU HAD AN ENDOSCOPIC PROCEDURE TODAY AT [facility]:   Refer to the procedure report that was given to you for any specific questions about what was found during the examination.  If the procedure report does not answer your questions, please call your gastroenterologist to clarify.  If you requested that your care partner not be given the details of your procedure findings, then the procedure report has been included in a sealed envelope for you to review at your convenience later.  YOU SHOULD EXPECT: Some feelings of bloating in the abdomen. Passage of more gas than usual.  Walking can help get rid of the air that was put into your GI tract during the procedure and reduce the bloating. If you had a lower endoscopy (such as a colonoscopy or flexible sigmoidoscopy) you may notice spotting of blood in your stool or on the toilet paper. If you underwent a bowel prep for your procedure, you may not have a normal bowel movement for a few days.  Please Note:  You might notice some irritation and congestion in your nose or some drainage.  This is from the oxygen used during your procedure.  There is no need for concern and it should clear up in a day or so.  SYMPTOMS TO REPORT IMMEDIATELY:   Following upper endoscopy (EGD)  Vomiting of blood or coffee ground material  New chest pain or pain under the shoulder blades  Painful or persistently difficult swallowing  New shortness of breath  Fever of 100F or higher  Black, tarry-looking stools  For urgent or emergent issues, a gastroenterologist can be reached at any hour by calling [phone].   DIET:  We do recommend a small meal at first, but then you may proceed to your regular diet.  Drink plenty of fluids but you should avoid alcoholic beverages for 24 hours.  ACTIVITY:  You should  plan to take it easy for the rest of today and you should NOT DRIVE or use heavy machinery until tomorrow (because of the sedation medicines used during the test).    FOLLOW UP: Our staff will call the number listed on your records the next business day following your procedure to check on you and address any questions or concerns that you may have regarding the information given to you following your procedure. If we do not reach you, we will leave a message.  However, if you are feeling well and you are not experiencing any problems, there is no need to return our call.  We will assume that you have returned to your regular daily activities without incident.  If any biopsies were taken you will be contacted by phone or by letter within the next 1-3 weeks.  Please call us at [phone] if you have not heard about the biopsies in 3 weeks.    SIGNATURES/CONFIDENTIALITY: You and/or your care partner have signed paperwork which will be entered into your electronic medical record.  These signatures attest to the fact that that the information above on your After Visit Summary has been reviewed and is understood.  Full responsibility of the confidentiality of this discharge information lies with you and/or your care-partner.

## 2016-02-13 ENCOUNTER — Telehealth: Payer: Self-pay

## 2016-02-13 ENCOUNTER — Telehealth: Payer: Self-pay | Admitting: *Deleted

## 2016-02-13 NOTE — Telephone Encounter (Signed)
Notes, insurance and demographics faxed today to [facility] for referral

## 2016-02-13 NOTE — Telephone Encounter (Signed)
  Follow up Call-  Call back number 02/12/2016  Post procedure Call Back phone  # [phone] hm  Permission to leave phone message Yes  Some recent data might be hidden     Patient questions:  Do you have a fever, pain , or abdominal swelling? No. Pain Score  0 *  Have you tolerated food without any problems? Yes.    Have you been able to return to your normal activities? Yes.    Do you have any questions about your discharge instructions: Diet   No. Medications  No. Follow up visit  No.  Do you have questions or concerns about your Care? No.  Actions: * If pain score is 4 or above: No action needed, pain <4.

## 2016-02-19 NOTE — Telephone Encounter (Signed)
Patient has been scheduled on 02/24/2016 at 12pm with Hart Robinsons Young  Pt awre

## 2016-02-24 DIAGNOSIS — M6281 Muscle weakness (generalized): Secondary | ICD-10-CM | POA: Diagnosis not present

## 2016-02-24 DIAGNOSIS — N393 Stress incontinence (female) (male): Secondary | ICD-10-CM | POA: Diagnosis not present

## 2016-02-24 DIAGNOSIS — R278 Other lack of coordination: Secondary | ICD-10-CM | POA: Diagnosis not present

## 2016-03-02 ENCOUNTER — Ambulatory Visit (INDEPENDENT_AMBULATORY_CARE_PROVIDER_SITE_OTHER): Payer: Medicare Other | Admitting: Physician Assistant

## 2016-03-02 ENCOUNTER — Encounter: Payer: Self-pay | Admitting: Physician Assistant

## 2016-03-02 VITALS — BP 133/83 | HR 77 | Temp 98.0°F | Ht 61.0 in | Wt 154.8 lb

## 2016-03-02 DIAGNOSIS — K219 Gastro-esophageal reflux disease without esophagitis: Secondary | ICD-10-CM

## 2016-03-02 DIAGNOSIS — Z6829 Body mass index (BMI) 29.0-29.9, adult: Secondary | ICD-10-CM

## 2016-03-02 DIAGNOSIS — I10 Essential (primary) hypertension: Secondary | ICD-10-CM | POA: Diagnosis not present

## 2016-03-02 DIAGNOSIS — F32A Depression, unspecified: Secondary | ICD-10-CM

## 2016-03-02 DIAGNOSIS — N3946 Mixed incontinence: Secondary | ICD-10-CM | POA: Diagnosis not present

## 2016-03-02 DIAGNOSIS — F411 Generalized anxiety disorder: Secondary | ICD-10-CM

## 2016-03-02 DIAGNOSIS — R278 Other lack of coordination: Secondary | ICD-10-CM | POA: Diagnosis not present

## 2016-03-02 DIAGNOSIS — I059 Rheumatic mitral valve disease, unspecified: Secondary | ICD-10-CM | POA: Diagnosis not present

## 2016-03-02 DIAGNOSIS — IMO0001 Reserved for inherently not codable concepts without codable children: Secondary | ICD-10-CM

## 2016-03-02 DIAGNOSIS — F329 Major depressive disorder, single episode, unspecified: Secondary | ICD-10-CM | POA: Diagnosis not present

## 2016-03-02 DIAGNOSIS — M6281 Muscle weakness (generalized): Secondary | ICD-10-CM | POA: Diagnosis not present

## 2016-03-02 NOTE — Progress Notes (Signed)
BP 133/83 (BP Location: Left Arm, Patient Position: Sitting, Cuff Size: Normal)   Pulse 77   Temp 98 F (36.7 C) (Oral)   Ht '5\' 1"'  (1.549 m)   Wt 154 lb 12.8 oz (70.2 kg)   BMI 29.25 kg/m    Subjective:    Patient ID: Carmen Green, female    DOB: [birth year], 65 y.o.   MRN: [id number]  Carmen Green is a 65 y.o. female presenting on 03/02/2016 for Recheck on conditions and medications.  HPI Patient here to be established as new patient at [facility].  This patient is known to me from [facility]. Her medical history is positive for hypertension, mitral valve disorder, heart block with pacemaker, GERD, history of diverticulitis with ovarian abscess that required surgical repair, depression and anxiety. Last month she saw her new gastroenterologist at [facility]. She saw a Dr. Silverio Decamp and had her scope done 2 days later. She was very comfortable with this new provider. She had been with Dr. Delfin Edis for many years.  After she was seen there she was referred to a therapist at [facility] for pelvic floor therapy. She is followed at [facility] for her pacemaker and mitral valve prolapse.  She is doing better on the Wellbutrin 300 mg. She states that overall she is feeling quite well despite a great concern for the care of her adult mentally disabled daughter when the time comes for she or her husband to die.    Relevant past medical, surgical, family and social history reviewed and updated as indicated. Interim medical history since our last visit reviewed. Allergies and medications reviewed and updated.   Data reviewed from any sources in EPIC.  Review of Systems  Constitutional: Negative.  Negative for activity change, fatigue and fever.  HENT: Negative.   Eyes: Negative.   Respiratory: Negative.  Negative for cough.   Cardiovascular: Negative.  Negative for chest pain.  Gastrointestinal: Negative.  Negative for abdominal pain.    Endocrine: Negative.   Genitourinary: Positive for urgency. Negative for dysuria.  Musculoskeletal: Negative.   Skin: Negative.   Neurological: Negative.   Psychiatric/Behavioral: The patient is nervous/anxious.     Per HPI unless specifically indicated above  Social History   Social History  . Marital status: Married    Spouse name: N/A  . Number of children: 1  . Years of education: N/A   Occupational History  . REGISTRATION    Social History Main Topics  . Smoking status: Never Smoker  . Smokeless tobacco: Never Used  . Alcohol use No  . Drug use: No  . Sexual activity: Not on file   Other Topics Concern  . 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 APPENDECTOMY    . BREAST SURGERY Right    biopsy  . COLONOSCOPY  10/19/2004   LEC  . EYE SURGERY Left    cataract extraction with IOL  . LAPAROSCOPIC LOW ANTERIOR RESECTION  11/16/2013   Robotic  . LAPAROSCOPIC LYSIS OF ADHESIONS N/A 11/15/2013   Procedure: LAPAROSCOPIC LYSIS OF ADHESIONS;  Surgeon: Adin Hector, MD;  Location: [facility];  Service: General;  Laterality: N/A;  . left ovary cyst removal  1972  . [place], 2004, 2012  device dependant  . POLYPECTOMY    . PROCTOSCOPY  11/15/2013   Procedure: RIGID PROCTOSCOPY;  Surgeon: Adin Hector, MD;  Location: [facility];  Service: General;;  . ROBOTIC ASSISTED SALPINGO OOPHERECTOMY Left 11/16/2013   Robotic en bloc w LAR resection  . TUBAL LIGATION    . UPPER GASTROINTESTINAL ENDOSCOPY      Family History  Problem Relation Age of Onset  . Diabetes Mother   . Stroke Mother   . Diabetes Father   . Lung cancer Father   . Lung cancer Maternal Grandfather   . Heart disease Maternal Aunt   . Heart disease Maternal Grandmother   . Breast cancer Maternal Aunt   . Colon cancer Neg Hx   . Esophageal cancer  Neg Hx   . Pancreatic cancer Neg Hx   . Rectal cancer Neg Hx   . Stomach cancer Neg Hx       Medication List       Accurate as of 03/02/16 10:11 AM. Always use your most recent med list.          buPROPion 150 MG 24 hr tablet Commonly known as:  WELLBUTRIN XL Take 300 mg by mouth daily.   busPIRone 10 MG tablet Commonly known as:  BUSPAR Take 10 mg by mouth 3 (three) times daily.   cetirizine 10 MG tablet Commonly known as:  ZYRTEC Take 10 mg by mouth daily as needed for allergies.   estradiol 0.5 MG tablet Commonly known as:  ESTRACE Take 0.5 mg by mouth daily.   fluticasone 50 MCG/ACT nasal spray Commonly known as:  FLONASE Place 1 spray into the nose daily as needed for allergies.   guaiFENesin 600 MG 12 hr tablet Commonly known as:  MUCINEX Take 1,200 mg by mouth 2 (two) times daily as needed for cough.   ibuprofen 400 MG tablet Commonly known as:  ADVIL,MOTRIN Take 1-2 tablets (400-800 mg total) by mouth every 6 (six) hours as needed for fever, headache, mild pain or moderate pain.   lisinopril-hydrochlorothiazide 20-12.5 MG tablet Commonly known as:  PRINZIDE,ZESTORETIC Take 1 tablet by mouth every morning.   LORazepam 0.5 MG tablet Commonly known as:  ATIVAN Take 0.5 mg by mouth at bedtime.   medroxyPROGESTERone 2.5 MG tablet Commonly known as:  PROVERA Take 2.5 mg by mouth daily.   multivitamin tablet Take 1 tablet by mouth daily.   omeprazole 40 MG capsule Commonly known as:  PRILOSEC Take 1 capsule (40 mg total) by mouth daily.   polyethylene glycol powder powder Commonly known as:  GLYCOLAX/MIRALAX Take by mouth 4 (four) times a week.          Objective:    BP 133/83 (BP Location: Left Arm, Patient Position: Sitting, Cuff Size: Normal)   Pulse 77   Temp 98 F (36.7 C) (Oral)   Ht '5\' 1"'  (1.549 m)   Wt 154 lb 12.8 oz (70.2 kg)   BMI 29.25 kg/m   Allergies  Allergen Reactions  . Amoxapine And Related     Rash  . Cephalexin  Rash  . Oxycodone     "Loopy"  . Penicillins Rash   Wt Readings from Last 3 Encounters:  03/02/16 154 lb 12.8 oz (70.2 kg)  02/12/16 154 lb (69.9 kg)  02/10/16 154 lb 3.2 oz (69.9 kg)    Physical Exam  Constitutional: She is oriented to person, place, and time. She appears well-developed and well-nourished.  HENT:  Head: Normocephalic and atraumatic.  Eyes:  Conjunctivae and EOM are normal. Pupils are equal, round, and reactive to light.  Neck: Normal range of motion. Neck supple.  Cardiovascular: Normal rate, regular rhythm, normal heart sounds and intact distal pulses.   Pulmonary/Chest: Effort normal and breath sounds normal.  Abdominal: Soft. Bowel sounds are normal.  Musculoskeletal: Normal range of motion.  Neurological: She is alert and oriented to person, place, and time. She has normal reflexes.  Skin: Skin is warm and dry. No rash noted.  Psychiatric: She has a normal mood and affect. Her behavior is normal. Judgment and thought content normal.  Nursing note and vitals reviewed.       Assessment & Plan:   1. Essential hypertension Lisinopril-hctz 20-12.5 one daily - CMP14+EGFR; Future - CBC with Differential/Platelet; Future - Lipid panel; Future - TSH; Future  2. Mitral valve disorder Continue care with cardiology  3. Gastroesophageal reflux disease, esophagitis presence not specified Omeprazole 40 mg one daily  4. Anxiety state buspar 44m TID Lorazepam 0.5 mg 1 QHS  5. Depression bupropion 1560m2 daily  6. Body mass index 29.0-29.9, adult Walking daily  7. Well adult Not perform, in chart for her future lab order - CMP14+EGFR; Future - CBC with Differential/Platelet; Future - Lipid panel; Future - TSH; Future   Continue all other maintenance medications as listed above.  Follow up plan: Return in about 8 months (around 10/30/2016) for CPE .  AnTerald SleeperA-C [address]  [phone] 03/02/2016, 10:11 AM

## 2016-03-02 NOTE — Patient Instructions (Signed)
Stress and Stress Management Stress is a normal reaction to life events. It is what you feel when life demands more than you are used to or more than you can handle. Some stress can be useful. For example, the stress reaction can help you catch the last bus of the day, study for a test, or meet a deadline at work. But stress that occurs too often or for too long can cause problems. It can affect your emotional health and interfere with relationships and normal daily activities. Too much stress can weaken your immune system and increase your risk for physical illness. If you already have a medical problem, stress can make it worse. CAUSES  All sorts of life events may cause stress. An event that causes stress for one person may not be stressful for another person. Major life events commonly cause stress. These may be positive or negative. Examples include losing your job, moving into a new home, getting married, having a baby, or losing a loved one. Less obvious life events may also cause stress, especially if they occur day after day or in combination. Examples include working long hours, driving in traffic, caring for children, being in debt, or being in a difficult relationship. SIGNS AND SYMPTOMS Stress may cause emotional symptoms including, the following:  Anxiety. This is feeling worried, afraid, on edge, overwhelmed, or out of control.  Anger. This is feeling irritated or impatient.  Depression. This is feeling sad, down, helpless, or guilty.  Difficulty focusing, remembering, or making decisions. Stress may cause physical symptoms, including the following:   Aches and pains. These may affect your head, neck, back, stomach, or other areas of your body.  Tight muscles or clenched jaw.  Low energy or trouble sleeping. Stress may cause unhealthy behaviors, including the following:   Eating to feel better (overeating) or skipping meals.  Sleeping too little, too much, or both.  Working  too much or putting off tasks (procrastination).  Smoking, drinking alcohol, or using drugs to feel better. DIAGNOSIS  Stress is diagnosed through an assessment by your health care provider. Your health care provider will ask questions about your symptoms and any stressful life events.Your health care provider will also ask about your medical history and may order blood tests or other tests. Certain medical conditions and medicine can cause physical symptoms similar to stress. Mental illness can cause emotional symptoms and unhealthy behaviors similar to stress. Your health care provider may refer you to a mental health professional for further evaluation.  TREATMENT  Stress management is the recommended treatment for stress.The goals of stress management are reducing stressful life events and coping with stress in healthy ways.  Techniques for reducing stressful life events include the following:  Stress identification. Self-monitor for stress and identify what causes stress for you. These skills may help you to avoid some stressful events.  Time management. Set your priorities, keep a calendar of events, and learn to say "no." These tools can help you avoid making too many commitments. Techniques for coping with stress include the following:  Rethinking the problem. Try to think realistically about stressful events rather than ignoring them or overreacting. Try to find the positives in a stressful situation rather than focusing on the negatives.  Exercise. Physical exercise can release both physical and emotional tension. The key is to find a form of exercise you enjoy and do it regularly.  Relaxation techniques. These relax the body and mind. Examples include yoga, meditation, tai chi, biofeedback, deep  breathing, progressive muscle relaxation, listening to music, being out in nature, journaling, and other hobbies. Again, the key is to find one or more that you enjoy and can do  regularly.  Healthy lifestyle. Eat a balanced diet, get plenty of sleep, and do not smoke. Avoid using alcohol or drugs to relax.  Strong support network. Spend time with family, friends, or other people you enjoy being around.Express your feelings and talk things over with someone you trust. Counseling or talktherapy with a mental health professional may be helpful if you are having difficulty managing stress on your own. Medicine is typically not recommended for the treatment of stress.Talk to your health care provider if you think you need medicine for symptoms of stress. HOME CARE INSTRUCTIONS  Keep all follow-up visits as directed by your health care provider.  Take all medicines as directed by your health care provider. SEEK MEDICAL CARE IF:  Your symptoms get worse or you start having new symptoms.  You feel overwhelmed by your problems and can no longer manage them on your own. SEEK IMMEDIATE MEDICAL CARE IF:  You feel like hurting yourself or someone else.   This information is not intended to replace advice given to you by your health care provider. Make sure you discuss any questions you have with your health care provider.   Document Released: 12/01/2000 Document Revised: 06/28/2014 Document Reviewed: 01/30/2013 Elsevier Interactive Patient Education 2016 [employer]  

## 2016-03-03 ENCOUNTER — Telehealth: Payer: Self-pay | Admitting: Physician Assistant

## 2016-03-10 DIAGNOSIS — M62838 Other muscle spasm: Secondary | ICD-10-CM | POA: Diagnosis not present

## 2016-03-10 DIAGNOSIS — R10813 Right lower quadrant abdominal tenderness: Secondary | ICD-10-CM | POA: Diagnosis not present

## 2016-03-10 DIAGNOSIS — N3946 Mixed incontinence: Secondary | ICD-10-CM | POA: Diagnosis not present

## 2016-03-10 DIAGNOSIS — M6281 Muscle weakness (generalized): Secondary | ICD-10-CM | POA: Diagnosis not present

## 2016-03-10 DIAGNOSIS — R278 Other lack of coordination: Secondary | ICD-10-CM | POA: Diagnosis not present

## 2016-04-14 ENCOUNTER — Encounter: Payer: Self-pay | Admitting: Cardiology

## 2016-04-14 ENCOUNTER — Encounter (INDEPENDENT_AMBULATORY_CARE_PROVIDER_SITE_OTHER): Payer: Self-pay

## 2016-04-14 ENCOUNTER — Ambulatory Visit (INDEPENDENT_AMBULATORY_CARE_PROVIDER_SITE_OTHER): Payer: Medicare Other | Admitting: Cardiology

## 2016-04-14 VITALS — BP 130/80 | HR 75 | Ht 62.0 in | Wt 155.6 lb

## 2016-04-14 DIAGNOSIS — I10 Essential (primary) hypertension: Secondary | ICD-10-CM | POA: Diagnosis not present

## 2016-04-14 DIAGNOSIS — I059 Rheumatic mitral valve disease, unspecified: Secondary | ICD-10-CM | POA: Diagnosis not present

## 2016-04-14 DIAGNOSIS — I442 Atrioventricular block, complete: Secondary | ICD-10-CM | POA: Diagnosis not present

## 2016-04-14 NOTE — Patient Instructions (Signed)
Medication Instructions:  Your physician recommends that you continue on your current medications as directed. Please refer to the Current Medication list given to you today.   Labwork: None   Testing/Procedures: None   Follow-Up: Your physician recommends that you schedule a follow-up appointment in: January 2018 with Dr Rayann Heman.         If you need a refill on your cardiac medications before your next appointment, please call your pharmacy.

## 2016-04-15 ENCOUNTER — Ambulatory Visit (INDEPENDENT_AMBULATORY_CARE_PROVIDER_SITE_OTHER): Payer: Medicare Other | Admitting: *Deleted

## 2016-04-15 DIAGNOSIS — I442 Atrioventricular block, complete: Secondary | ICD-10-CM

## 2016-04-15 NOTE — Progress Notes (Signed)
Remote pacemaker transmission.   

## 2016-04-15 NOTE — Progress Notes (Signed)
Patient ID: Carmen Green, female   DOB: Dec 26, 1950, 65 y.o.   MRN: [id number] PCP: Particia Nearing  65 yo with history of pacemaker for AV block and MV prolapse presents for cardiology followup.  Last echo in 2/17 showed EF 55-60% with MV prolapse and mild to moderate MR.    Doing well overall.  No palpitations or lightheadedness.  No chest pain or exertional dyspnea.  BP controlled.   ECG: NSR, v-paced  Labs (1/16): K 4.6, creatinine 0.72, LDL 108, HDL 49, TSH normal, HCT 37.5  Allergies:  1) ! Keflex  2) ! Pcn   Past Medical History:  1. 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  2. MITRAL VALVE PROLAPSE: Echo (12/11) with EF 0000000, grade I diastolic dysfunction, mild MR with mitral valve prolapse, mild left atrial enlargement, asynchronous septum due to pacing.  Echo (4/14) with EF 55-60%, mild LVH, MV prolapse with mild MR, septal paradox with RV pacing.  - Echo (2/17): EF 55-60%, mild to moderate MR with MV prolapse.  3. HYPERLIPIDEMIA  4. HYPERTENSION  5. ANXIETY  6. HIATAL HERNIA 7. GERD 8. COLONIC POLYPS 9. Hx of ESOPHAGITIS   10. LHC (5/09): no coronary disease  11. Diverticulitis: Multiple episodes, 2 in 2015, 1 in 2012. Patient had partial colectomy in 5/15 for recurrent diverticulitis.  12. Palpitations: 30 day monitor in 2/17 with rare PVCs.   Family History:  Family History of Diabetes: Mother, Grandmother  Family History of Heart Disease: Maternal Grandmother, Maternal Aunt  Mother with CVA  Social History:  Illicit Drug Use - no  Patient has never smoked.  Alcohol Use - no  Patient does not get regular exercise.  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 buPROPion (WELLBUTRIN XL) 150 MG 24 hr tablet Take 300 mg by mouth daily.  0  . busPIRone (BUSPAR) 10 MG tablet Take 10 mg by mouth 3 (three) times daily.      . cetirizine (ZYRTEC) 10 MG tablet Take 10  mg by mouth daily as needed for allergies.     Marland Kitchen estradiol (ESTRACE) 0.5 MG tablet Take 0.5 mg by mouth daily.    . fluticasone (FLONASE) 50 MCG/ACT nasal spray Place 1 spray into the nose daily as needed for allergies.     Marland Kitchen guaiFENesin (MUCINEX) 600 MG 12 hr tablet Take 1,200 mg by mouth 2 (two) times daily as needed for cough.     Marland Kitchen ibuprofen (ADVIL,MOTRIN) 400 MG tablet Take 1-2 tablets (400-800 mg total) by mouth every 6 (six) hours as needed for fever, headache, mild pain or moderate pain. 40 tablet 1  . lisinopril-hydrochlorothiazide (PRINZIDE,ZESTORETIC) 20-12.5 MG per tablet Take 1 tablet by mouth every morning.     Marland Kitchen LORazepam (ATIVAN) 0.5 MG tablet Take 0.5 mg by mouth at bedtime.     . medroxyPROGESTERone (PROVERA) 2.5 MG tablet Take 2.5 mg by mouth daily.    . Multiple Vitamin (MULTIVITAMIN) tablet Take 1 tablet by mouth daily.      Marland Kitchen omeprazole (PRILOSEC) 40 MG capsule Take 1 capsule (40 mg total) by mouth daily. 30 capsule 11  . polyethylene glycol powder (GLYCOLAX/MIRALAX) powder Take 1 Container by mouth 4 (four) times a week.      Current Facility-Administered Medications  Medication Dose Route Frequency Provider Last Rate Last Dose  . 0.9 %  sodium chloride infusion  500 mL Intravenous Continuous Kavitha  Bary Richard, MD        BP 130/80   Pulse 75   Ht 5\' 2"  (1.575 m)   Wt 155 lb 9.6 oz (70.6 kg)   SpO2 97%   BMI 28.46 kg/m  General: NAD Neck: No JVD, no thyromegaly or thyroid nodule.  Lungs: Clear to auscultation bilaterally with normal respiratory effort. CV: Nondisplaced PMI.  Heart regular S1/S2, no [id number], late 2/6 systolic murmur at apex.  No peripheral edema.  No carotid bruit.  Normal pedal pulses.  Abdomen: Soft, nontender, no hepatosplenomegaly, no distention.  Neurologic: Alert and oriented x 3.  Psych: Normal affect. Extremities: No clubbing or cyanosis.   Assessment/Plan: 1. St Jude PCM: Gets regular PPM followup.  2. Mitral regurgitation:  Mild-moderate MR due to MV prolapse on 2/17 echo.  Stable symptoms.  Would consider repeating echo in 2 years.  3. HTN: BP seems well-controlled at this time.  4. Hyperlipidemia: PCP to draw soon.   Patient can followup in 1 year with Dr. Rayann Heman given my transition to CHF clinic.     Loralie Champagne 04/15/2016

## 2016-04-16 ENCOUNTER — Other Ambulatory Visit: Payer: Self-pay | Admitting: *Deleted

## 2016-04-16 MED ORDER — LORAZEPAM 0.5 MG PO TABS: 0.5000 mg | ORAL_TABLET | Freq: Two times a day (BID) | ORAL | 2 refills | Status: DC | PRN

## 2016-04-16 NOTE — Telephone Encounter (Signed)
Last filled 02/27/16, last seen 03/02/16. Route to pools for call in

## 2016-04-19 ENCOUNTER — Other Ambulatory Visit: Payer: Self-pay | Admitting: Physician Assistant

## 2016-04-23 ENCOUNTER — Encounter: Payer: Self-pay | Admitting: Cardiology

## 2016-05-05 ENCOUNTER — Ambulatory Visit (INDEPENDENT_AMBULATORY_CARE_PROVIDER_SITE_OTHER): Payer: Medicare Other

## 2016-05-05 DIAGNOSIS — Z23 Encounter for immunization: Secondary | ICD-10-CM | POA: Diagnosis not present

## 2016-05-13 LAB — CUP PACEART REMOTE DEVICE CHECK
Battery Remaining Longevity: 67 mo
Battery Remaining Percentage: 73 %
Battery Voltage: 2.92 V
Brady Statistic AP VS Percent: 1 %
Brady Statistic RA Percent Paced: 1 %
Implantable Lead Implant Date: 19920319
Implantable Lead Implant Date: 19920319
Implantable Lead Location: 753860
Lead Channel Impedance Value: 440 Ohm
Lead Channel Pacing Threshold Amplitude: 1.625 V
Lead Channel Pacing Threshold Amplitude: 3 V
Lead Channel Pacing Threshold Pulse Width: 1.5 ms
Lead Channel Sensing Intrinsic Amplitude: 2.9 mV
MDC IDC LEAD LOCATION: 753859
MDC IDC MSMT LEADCHNL RV IMPEDANCE VALUE: 510 Ohm
MDC IDC MSMT LEADCHNL RV PACING THRESHOLD PULSEWIDTH: 0.5 ms
MDC IDC MSMT LEADCHNL RV SENSING INTR AMPL: 7.6 mV
MDC IDC PG IMPLANT DT: 20120803
MDC IDC PG SERIAL: 7252855
MDC IDC SESS DTM: 20171026062847
MDC IDC SET LEADCHNL RA PACING AMPLITUDE: 4.5 V
MDC IDC SET LEADCHNL RV PACING AMPLITUDE: 1.875
MDC IDC SET LEADCHNL RV PACING PULSEWIDTH: 0.5 ms
MDC IDC SET LEADCHNL RV SENSING SENSITIVITY: 4 mV
MDC IDC STAT BRADY AP VP PERCENT: 1 %
MDC IDC STAT BRADY AS VP PERCENT: 99 %
MDC IDC STAT BRADY AS VS PERCENT: 1 %
MDC IDC STAT BRADY RV PERCENT PACED: 99 %

## 2016-06-17 ENCOUNTER — Other Ambulatory Visit: Payer: Self-pay | Admitting: Physician Assistant

## 2016-06-17 DIAGNOSIS — Z1231 Encounter for screening mammogram for malignant neoplasm of breast: Secondary | ICD-10-CM

## 2016-07-01 ENCOUNTER — Telehealth: Payer: Self-pay | Admitting: Physician Assistant

## 2016-07-01 DIAGNOSIS — E559 Vitamin D deficiency, unspecified: Secondary | ICD-10-CM

## 2016-07-01 DIAGNOSIS — Z0000 Encounter for general adult medical examination without abnormal findings: Secondary | ICD-10-CM

## 2016-07-01 DIAGNOSIS — R5383 Other fatigue: Secondary | ICD-10-CM

## 2016-07-02 ENCOUNTER — Other Ambulatory Visit: Payer: Self-pay | Admitting: *Deleted

## 2016-07-02 DIAGNOSIS — R5383 Other fatigue: Secondary | ICD-10-CM

## 2016-07-02 DIAGNOSIS — E785 Hyperlipidemia, unspecified: Secondary | ICD-10-CM

## 2016-07-02 DIAGNOSIS — I10 Essential (primary) hypertension: Secondary | ICD-10-CM

## 2016-07-02 DIAGNOSIS — E559 Vitamin D deficiency, unspecified: Secondary | ICD-10-CM

## 2016-07-02 NOTE — Telephone Encounter (Signed)
Patient aware.

## 2016-07-02 NOTE — Telephone Encounter (Signed)
Order created

## 2016-07-06 ENCOUNTER — Other Ambulatory Visit: Payer: Medicare Other

## 2016-07-06 DIAGNOSIS — R5383 Other fatigue: Secondary | ICD-10-CM

## 2016-07-06 DIAGNOSIS — E559 Vitamin D deficiency, unspecified: Secondary | ICD-10-CM | POA: Diagnosis not present

## 2016-07-06 DIAGNOSIS — I10 Essential (primary) hypertension: Secondary | ICD-10-CM | POA: Diagnosis not present

## 2016-07-06 DIAGNOSIS — E785 Hyperlipidemia, unspecified: Secondary | ICD-10-CM

## 2016-07-07 LAB — CBC WITH DIFFERENTIAL/PLATELET
Basophils Absolute: 0.1 10*3/uL (ref 0.0–0.2)
Basos: 1 %
EOS (ABSOLUTE): 0.2 10*3/uL (ref 0.0–0.4)
Eos: 3 %
Hematocrit: 38.4 % (ref 34.0–46.6)
Hemoglobin: 13.3 g/dL (ref 11.1–15.9)
Immature Grans (Abs): 0 10*3/uL (ref 0.0–0.1)
Immature Granulocytes: 0 %
LYMPHS ABS: 1.4 10*3/uL (ref 0.7–3.1)
Lymphs: 28 %
MCH: 32.8 pg (ref 26.6–33.0)
MCHC: 34.6 g/dL (ref 31.5–35.7)
MCV: 95 fL (ref 79–97)
Monocytes Absolute: 0.6 10*3/uL (ref 0.1–0.9)
Monocytes: 12 %
NEUTROS ABS: 2.7 10*3/uL (ref 1.4–7.0)
Neutrophils: 56 %
PLATELETS: 259 10*3/uL (ref 150–379)
RBC: 4.06 x10E6/uL (ref 3.77–5.28)
RDW: 12.7 % (ref 12.3–15.4)
WBC: 4.9 10*3/uL (ref 3.4–10.8)

## 2016-07-07 LAB — CMP14+EGFR
A/G RATIO: 1.7 (ref 1.2–2.2)
ALBUMIN: 4.6 g/dL (ref 3.6–4.8)
ALK PHOS: 52 IU/L (ref 39–117)
ALT: 12 IU/L (ref 0–32)
AST: 21 IU/L (ref 0–40)
BILIRUBIN TOTAL: 0.6 mg/dL (ref 0.0–1.2)
BUN / CREAT RATIO: 12 (ref 12–28)
BUN: 13 mg/dL (ref 8–27)
CHLORIDE: 99 mmol/L (ref 96–106)
CO2: 21 mmol/L (ref 18–29)
Calcium: 9.7 mg/dL (ref 8.7–10.3)
Creatinine, Ser: 1.07 mg/dL — ABNORMAL HIGH (ref 0.57–1.00)
GFR calc Af Amer: 62 mL/min/{1.73_m2} (ref >59)
GFR calc non Af Amer: 54 mL/min/{1.73_m2} — ABNORMAL LOW (ref >59)
GLUCOSE: 103 mg/dL — AB (ref 65–99)
Globulin, Total: 2.7 g/dL (ref 1.5–4.5)
POTASSIUM: 4.2 mmol/L (ref 3.5–5.2)
Sodium: 140 mmol/L (ref 134–144)
Total Protein: 7.3 g/dL (ref 6.0–8.5)

## 2016-07-07 LAB — LIPID PANEL
CHOLESTEROL TOTAL: 187 mg/dL (ref 100–199)
Chol/HDL Ratio: 3.8 ratio units (ref 0.0–4.4)
HDL: 49 mg/dL (ref >39)
LDL Calculated: 119 mg/dL — ABNORMAL HIGH (ref 0–99)
Triglycerides: 93 mg/dL (ref 0–149)
VLDL CHOLESTEROL CAL: 19 mg/dL (ref 5–40)

## 2016-07-07 LAB — THYROID PANEL WITH TSH
FREE THYROXINE INDEX: 1.7 (ref 1.2–4.9)
T3 UPTAKE RATIO: 28 % (ref 24–39)
T4, Total: 6 ug/dL (ref 4.5–12.0)
TSH: 1.65 u[IU]/mL (ref 0.450–4.500)

## 2016-07-07 LAB — VITAMIN D 25 HYDROXY (VIT D DEFICIENCY, FRACTURES): VIT D 25 HYDROXY: 53.6 ng/mL (ref 30.0–100.0)

## 2016-07-14 ENCOUNTER — Telehealth: Payer: Self-pay | Admitting: Gastroenterology

## 2016-07-15 ENCOUNTER — Other Ambulatory Visit: Payer: Self-pay

## 2016-07-15 ENCOUNTER — Other Ambulatory Visit: Payer: Self-pay | Admitting: Physician Assistant

## 2016-07-15 MED ORDER — RANITIDINE HCL 150 MG PO TABS: 150.0000 mg | ORAL_TABLET | Freq: Every day | ORAL | 3 refills | Status: DC

## 2016-07-15 NOTE — Telephone Encounter (Signed)
Please advise her to take Omeprazole 30 mins before breakfast and take Ranitidine 150mg  at bedtime as needed. Follow anti reflux measures. If continues to have persistent symptoms please ask patient to follow up in office visit for evaluation

## 2016-07-15 NOTE — Telephone Encounter (Signed)
Patient is notified.

## 2016-07-15 NOTE — Telephone Encounter (Signed)
Carmen Green takes her Omeprazole 40 mg in the afternoon. She started doing this trying to control late evening reflux and heartburn. It doesn't work that well and she is still experiencing symptoms. She has mild indigestion starting in the morning that progressively get worse through the day. She is supplementing with TUMS. Please advise.

## 2016-07-16 ENCOUNTER — Encounter (INDEPENDENT_AMBULATORY_CARE_PROVIDER_SITE_OTHER): Payer: Self-pay

## 2016-07-16 ENCOUNTER — Ambulatory Visit (INDEPENDENT_AMBULATORY_CARE_PROVIDER_SITE_OTHER): Payer: Medicare Other | Admitting: Internal Medicine

## 2016-07-16 ENCOUNTER — Encounter: Payer: Self-pay | Admitting: Internal Medicine

## 2016-07-16 VITALS — BP 132/74 | HR 86 | Ht 61.5 in | Wt 153.0 lb

## 2016-07-16 DIAGNOSIS — I10 Essential (primary) hypertension: Secondary | ICD-10-CM

## 2016-07-16 DIAGNOSIS — I442 Atrioventricular block, complete: Secondary | ICD-10-CM | POA: Diagnosis not present

## 2016-07-16 NOTE — Patient Instructions (Addendum)
Medication Instructions:  Your physician recommends that you continue on your current medications as directed. Please refer to the Current Medication list given to you today.   Labwork: None ordered   Testing/Procedures: None ordered   Follow-Up: Remote monitoring is used to monitor your Pacemaker from home. This monitoring reduces the number of office visits required to check your device to one time per year. It allows us to keep an eye on the functioning of your device to ensure it is working properly. You are scheduled for a device check from home on 10/18/16. You may send your transmission at any time that day. If you have a wireless device, the transmission will be sent automatically. After your physician reviews your transmission, you will receive a postcard with your next transmission date.   Your physician wants you to follow-up in: 12 months with Dr Allred You will receive a reminder letter in the mail two months in advance. If you don't receive a letter, please call our office to schedule the follow-up appointment.    Any Other Special Instructions Will Be Listed Below (If Applicable).     If you need a refill on your cardiac medications before your next appointment, please call your pharmacy.   

## 2016-07-16 NOTE — Telephone Encounter (Signed)
Phone in

## 2016-07-16 NOTE — Progress Notes (Signed)
PCP: Terald Sleeper, PA-C Primary Cardiologist:  Dr Kearney Hard Carmen Green is a 66 y.o. female who presents today for routine electrophysiology followup.  Since last being seen in our clinic, the patient reports doing very well.   She has had no presyncope or syncope.  Palpitations are resolved.  Today, she denies symptoms of  chest pain, shortness of breath,  lower extremity edema, or dizziness.  The patient is otherwise without complaint today.   Past Medical History:  Diagnosis Date  . Allergy   . Anemia   . Anxiety   . Arthritis   . Cataract    bil cataracts removed  . Colon polyp   . Complete heart block ([place])   . Contusion of chest wall 10/10/2007   Centricity Description: CHEST WALL CONTUSION Qualifier: Diagnosis of  By: Aline Brochure MD, Dorothyann Peng   Centricity Description: CONTUSION, RIGHT RIB Qualifier: Diagnosis of  By: Aline Brochure MD, Dorothyann Peng    . Depression   . Diverticulitis   . Esophagitis   . ESOPHAGITIS 06/25/2008   Qualifier: History of  By: Nelson-Smith CMA (AAMA), Dottie    . GERD (gastroesophageal reflux disease)   . H/O seasonal allergies   . Heart murmur   . Hiatal hernia   . 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 Hypertension   . Mitral valve prolapse   . Pacemaker    Past Surgical History:  Procedure Laterality Date  . APPENDECTOMY    . BREAST SURGERY Right    biopsy  . COLONOSCOPY  10/19/2004   [facility]  . EYE SURGERY Left    cataract extraction with IOL  . LAPAROSCOPIC LOW ANTERIOR RESECTION  11/16/2013   Robotic  . LAPAROSCOPIC LYSIS OF ADHESIONS N/A 11/15/2013   Procedure: LAPAROSCOPIC LYSIS OF ADHESIONS;  Surgeon: Adin Hector, MD;  Location: [facility];  Service: General;  Laterality: N/A;  . left ovary cyst removal  1972  . [place], 2004, 2012   device dependant  . POLYPECTOMY    . PROCTOSCOPY  11/15/2013   Procedure: RIGID PROCTOSCOPY;  Surgeon: Adin Hector, MD;   Location: [facility];  Service: General;;  . ROBOTIC ASSISTED SALPINGO OOPHERECTOMY Left 11/16/2013   Robotic en bloc w LAR resection  . TUBAL LIGATION    . UPPER GASTROINTESTINAL ENDOSCOPY      Current Outpatient Prescriptions  Medication Sig Dispense Refill  . buPROPion (WELLBUTRIN XL) 150 MG 24 hr tablet Take 300 mg by mouth daily.  0  . busPIRone (BUSPAR) 10 MG tablet Take 10 mg by mouth 3 (three) times daily.      . cetirizine (ZYRTEC) 10 MG tablet Take 10 mg by mouth daily as needed for allergies.     Marland Kitchen estradiol (ESTRACE) 0.5 MG tablet Take 0.5 mg by mouth daily.    . fluticasone (FLONASE) 50 MCG/ACT nasal spray Place 1 spray into the nose daily as needed for allergies.     Marland Kitchen guaiFENesin (MUCINEX) 600 MG 12 hr tablet Take 1,200 mg by mouth 2 (two) times daily as needed for cough.     Marland Kitchen ibuprofen (ADVIL,MOTRIN) 400 MG tablet Take 1-2 tablets (400-800 mg total) by mouth every 6 (six) hours as needed for fever, headache, mild pain or moderate pain. 40 tablet 1  . lisinopril-hydrochlorothiazide (PRINZIDE,ZESTORETIC) 20-12.5 MG per tablet Take 1 tablet by mouth every morning.     Marland Kitchen LORazepam (ATIVAN) 0.5  MG tablet Take 1 tablet (0.5 mg total) by mouth 2 (two) times daily as needed for anxiety. 60 tablet 2  . medroxyPROGESTERone (PROVERA) 2.5 MG tablet Take 2.5 mg by mouth daily.    . Multiple Vitamin (MULTIVITAMIN) tablet Take 1 tablet by mouth daily.      Marland Kitchen omeprazole (PRILOSEC) 40 MG capsule Take 1 capsule (40 mg total) by mouth daily. 30 capsule 11  . polyethylene glycol powder (GLYCOLAX/MIRALAX) powder Take 1 Container by mouth 4 (four) times a week.     . ranitidine (ZANTAC) 150 MG tablet Take 1 tablet (150 mg total) by mouth at bedtime. 30 tablet 3   Current Facility-Administered Medications  Medication Dose Route Frequency Provider Last Rate Last Dose  . 0.9 %  sodium chloride infusion  500 mL Intravenous Continuous Mauri Pole, MD        Physical Exam: Vitals:   07/16/16  1136  BP: 132/74  Pulse: 86  Weight: 153 lb (69.4 kg)  Height: 5' 1.5" (1.562 m)    GEN- The patient is well appearing, alert and oriented x 3 today.   Head- normocephalic, atraumatic Eyes-  Sclera clear, conjunctiva pink Ears- hearing intact Oropharynx- clear Lungs- Clear to ausculation bilaterally, normal work of breathing Chest- R sided PPM pocket is well healed Heart- Regular rate and rhythm, no murmurs, rubs or gallops, PMI not laterally displaced GI- soft, NT, ND, + BS Extremities- no clubbing, cyanosis, or edema  PPM interrogation- reviewed in detail today,  See PACEART report  Assessment and Plan:  1. Complete heart block Normal pacemaker function See Pace Art report Atrial lead threshold has increased chronically No changes today  2. HTN Stable No change required today  Followed remotely with merlin Return to see me in 1 year  Jeneen Rinks Kennethia Lynes,MD

## 2016-07-16 NOTE — Telephone Encounter (Signed)
rx called in

## 2016-07-20 LAB — CUP PACEART INCLINIC DEVICE CHECK
Brady Statistic RV Percent Paced: 99.16 %
Date Time Interrogation Session: 20180126174623
Implantable Lead Location: 753860
Lead Channel Impedance Value: 560 Ohm
Lead Channel Pacing Threshold Amplitude: 2.5 V
Lead Channel Pacing Threshold Pulse Width: 1.5 ms
Lead Channel Sensing Intrinsic Amplitude: 3.4 mV
Lead Channel Setting Pacing Amplitude: 1.375
Lead Channel Setting Sensing Sensitivity: 4 mV
MDC IDC LEAD IMPLANT DT: 19920319
MDC IDC LEAD IMPLANT DT: 19920319
MDC IDC LEAD LOCATION: 753859
MDC IDC MSMT BATTERY VOLTAGE: 2.92 V
MDC IDC MSMT LEADCHNL RA IMPEDANCE VALUE: 490 Ohm
MDC IDC MSMT LEADCHNL RV PACING THRESHOLD AMPLITUDE: 1.125 V
MDC IDC MSMT LEADCHNL RV PACING THRESHOLD PULSEWIDTH: 0.5 ms
MDC IDC MSMT LEADCHNL RV SENSING INTR AMPL: 10.8 mV
MDC IDC PG IMPLANT DT: 20120803
MDC IDC SET LEADCHNL RV PACING PULSEWIDTH: 0.5 ms
MDC IDC STAT BRADY RA PERCENT PACED: 0.15 %
Pulse Gen Model: 2210
Pulse Gen Serial Number: 7252855

## 2016-07-29 ENCOUNTER — Ambulatory Visit
Admission: RE | Admit: 2016-07-29 | Discharge: 2016-07-29 | Disposition: A | Discharge status: Home / Self Care | Payer: Medicare Other | Source: Ambulatory Visit | Attending: Physician Assistant | Admitting: Physician Assistant

## 2016-07-29 DIAGNOSIS — Z1231 Encounter for screening mammogram for malignant neoplasm of breast: Secondary | ICD-10-CM | POA: Diagnosis not present

## 2016-08-02 ENCOUNTER — Telehealth: Payer: Self-pay | Admitting: Physician Assistant

## 2016-08-02 ENCOUNTER — Other Ambulatory Visit: Payer: Self-pay | Admitting: *Deleted

## 2016-08-02 MED ORDER — LISINOPRIL-HYDROCHLOROTHIAZIDE 20-12.5 MG PO TABS: 1.0000 | ORAL_TABLET | Freq: Every morning | ORAL | 2 refills | Status: DC

## 2016-08-02 NOTE — Telephone Encounter (Signed)
Rx sent to pharmacy   

## 2016-08-04 ENCOUNTER — Telehealth: Payer: Self-pay | Admitting: Physician Assistant

## 2016-08-04 MED ORDER — LISINOPRIL-HYDROCHLOROTHIAZIDE 20-12.5 MG PO TABS: 1.0000 | ORAL_TABLET | Freq: Every morning | ORAL | 2 refills | Status: DC

## 2016-08-04 NOTE — Telephone Encounter (Signed)
Rx sent to mail order as requested. Patient aware.

## 2016-09-07 ENCOUNTER — Other Ambulatory Visit: Payer: Self-pay | Admitting: Physician Assistant

## 2016-10-13 DIAGNOSIS — H35371 Puckering of macula, right eye: Secondary | ICD-10-CM | POA: Diagnosis not present

## 2016-10-13 DIAGNOSIS — H43812 Vitreous degeneration, left eye: Secondary | ICD-10-CM | POA: Diagnosis not present

## 2016-10-13 DIAGNOSIS — H10413 Chronic giant papillary conjunctivitis, bilateral: Secondary | ICD-10-CM | POA: Diagnosis not present

## 2016-10-13 DIAGNOSIS — H26491 Other secondary cataract, right eye: Secondary | ICD-10-CM | POA: Diagnosis not present

## 2016-10-18 ENCOUNTER — Ambulatory Visit (INDEPENDENT_AMBULATORY_CARE_PROVIDER_SITE_OTHER): Payer: Medicare Other | Admitting: *Deleted

## 2016-10-18 DIAGNOSIS — I442 Atrioventricular block, complete: Secondary | ICD-10-CM | POA: Diagnosis not present

## 2016-10-18 NOTE — Progress Notes (Signed)
Remote pacemaker transmission.   

## 2016-10-19 ENCOUNTER — Encounter: Payer: Self-pay | Admitting: Cardiology

## 2016-10-19 LAB — CUP PACEART REMOTE DEVICE CHECK
Brady Statistic AS VP Percent: 99 %
Brady Statistic AS VS Percent: 1 %
Date Time Interrogation Session: 20180430093136
Implantable Lead Implant Date: 19920319
Implantable Lead Implant Date: 19920319
Implantable Lead Location: 753859
Implantable Lead Location: 753860
Implantable Pulse Generator Implant Date: 20120803
Lead Channel Impedance Value: 440 Ohm
Lead Channel Impedance Value: 550 Ohm
Lead Channel Pacing Threshold Pulse Width: 0.5 ms
Lead Channel Sensing Intrinsic Amplitude: 1.5 mV
Lead Channel Sensing Intrinsic Amplitude: 9.2 mV
Lead Channel Setting Pacing Pulse Width: 0.5 ms
MDC IDC MSMT BATTERY REMAINING LONGEVITY: 98 mo
MDC IDC MSMT BATTERY REMAINING PERCENTAGE: 73 %
MDC IDC MSMT BATTERY VOLTAGE: 2.92 V
MDC IDC MSMT LEADCHNL RV PACING THRESHOLD AMPLITUDE: 1.5 V
MDC IDC SET LEADCHNL RV PACING AMPLITUDE: 1.75 V
MDC IDC SET LEADCHNL RV SENSING SENSITIVITY: 4 mV
MDC IDC STAT BRADY RV PERCENT PACED: 99 %
Pulse Gen Model: 2210
Pulse Gen Serial Number: 7252855

## 2016-11-01 ENCOUNTER — Other Ambulatory Visit: Payer: Self-pay

## 2016-11-01 MED ORDER — BUSPIRONE HCL 10 MG PO TABS: 10.0000 mg | ORAL_TABLET | Freq: Three times a day (TID) | ORAL | 0 refills | Status: DC

## 2016-11-02 ENCOUNTER — Other Ambulatory Visit: Payer: Self-pay

## 2016-11-02 MED ORDER — ESTRADIOL 0.5 MG PO TABS: 0.5000 mg | ORAL_TABLET | Freq: Every day | ORAL | 1 refills | Status: DC

## 2016-11-02 MED ORDER — MEDROXYPROGESTERONE ACETATE 2.5 MG PO TABS: 2.5000 mg | ORAL_TABLET | Freq: Every day | ORAL | 1 refills | Status: DC

## 2016-11-03 ENCOUNTER — Other Ambulatory Visit: Payer: Self-pay | Admitting: *Deleted

## 2016-11-04 MED ORDER — BUPROPION HCL ER (XL) 150 MG PO TB24: 300.0000 mg | ORAL_TABLET | Freq: Every day | ORAL | 3 refills | Status: DC

## 2016-12-21 ENCOUNTER — Ambulatory Visit (INDEPENDENT_AMBULATORY_CARE_PROVIDER_SITE_OTHER): Payer: Medicare Other | Admitting: *Deleted

## 2016-12-21 ENCOUNTER — Ambulatory Visit (INDEPENDENT_AMBULATORY_CARE_PROVIDER_SITE_OTHER): Payer: Medicare Other

## 2016-12-21 VITALS — BP 131/70 | HR 73 | Ht 62.0 in | Wt 150.0 lb

## 2016-12-21 DIAGNOSIS — Z780 Asymptomatic menopausal state: Secondary | ICD-10-CM | POA: Diagnosis not present

## 2016-12-21 DIAGNOSIS — Z0000 Encounter for general adult medical examination without abnormal findings: Secondary | ICD-10-CM

## 2016-12-21 DIAGNOSIS — Z23 Encounter for immunization: Secondary | ICD-10-CM | POA: Diagnosis not present

## 2016-12-21 NOTE — Progress Notes (Signed)
Subjective:   Carmen Green is a 66 y.o. female who presents for an Initial Medicare Annual Wellness Visit.  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  She enjoys reading and working in her yard.  She attends church and volunteers with different mission projects there.  She lives with her husband and her adult daughter who is disabled.  She feels her health is the same as last year, and reports no ER visits, hospitalizations, surgeries this year.  She reports she does have a Psychologist, forensic which she gets checked regularly.   Review of Systems    All systems negative today        Objective:    Today's Vitals   12/21/16 1136  BP: 131/70  Pulse: 73  Weight: 150 lb (68 kg)  Height: 5\' 2"  (1.575 m)  PainSc: 0-No pain   Body mass index is 27.44 kg/m.   Current Medications (verified) Outpatient Encounter Prescriptions as of 12/21/2016  Medication Sig  . busPIRone (BUSPAR) 10 MG tablet Take 1 tablet (10 mg total) by mouth 3 (three) times daily.  . cetirizine (ZYRTEC) 10 MG tablet Take 10 mg by mouth daily as needed for allergies.   . Cholecalciferol (VITAMIN D3) 1000 units CAPS Take 1 capsule by mouth daily.  . clindamycin (CLEOCIN) 150 MG capsule TAKE 4 CAPSULES 1 HOUR PRIOR TO DENTAL VISIT  . estradiol (ESTRACE) 0.5 MG tablet Take 1 tablet (0.5 mg total) by mouth daily.  . fluticasone (FLONASE) 50 MCG/ACT nasal spray Place 1 spray into the nose daily as needed for allergies.   Marland Kitchen guaiFENesin (MUCINEX) 600 MG 12 hr tablet Take 600 mg by mouth 2 (two) times daily as needed for cough.   Marland Kitchen ibuprofen (ADVIL,MOTRIN) 400 MG tablet Take 1-2 tablets (400-800 mg total) by mouth every 6 (six) hours as needed for fever, headache, mild pain or moderate pain.  Marland Kitchen lisinopril-hydrochlorothiazide (PRINZIDE,ZESTORETIC) 20-12.5 MG tablet Take 1 tablet by mouth every morning.  Marland Kitchen LORazepam (ATIVAN) 0.5 MG tablet TAKE 1 TABLET IN THE [facility] AND AT BEDTIME AS NEEDED  FOR 30 DAYS  . medroxyPROGESTERone (PROVERA) 2.5 MG tablet Take 1 tablet (2.5 mg total) by mouth daily.  . Multiple Vitamin (MULTIVITAMIN) tablet Take 1 tablet by mouth daily.    Marland Kitchen omeprazole (PRILOSEC) 40 MG capsule Take 1 capsule (40 mg total) by mouth daily.  . polyethylene glycol powder (GLYCOLAX/MIRALAX) powder Take 1 Container by mouth 4 (four) times a week.   . ranitidine (ZANTAC) 150 MG tablet Take 1 tablet (150 mg total) by mouth at bedtime. (Patient taking differently: Take 150 mg by mouth at bedtime as needed. )  . buPROPion (WELLBUTRIN XL) 150 MG 24 hr tablet Take 2 tablets (300 mg total) by mouth daily. (Patient not taking: Reported on 12/21/2016)   Facility-Administered Encounter Medications as of 12/21/2016  Medication  . 0.9 %  sodium chloride infusion    Allergies (verified) Amoxapine and related; Cephalexin; Oxycodone; and Penicillins   History: Past Medical History:  Diagnosis Date  . Allergy   . Anemia   . Anxiety   . Arthritis   . Cataract    bil cataracts removed  . Colon polyp   . Complete heart block ([place])   . Contusion of chest wall 10/10/2007   Centricity Description: CHEST WALL CONTUSION Qualifier: Diagnosis of  By: Aline Brochure MD, Dorothyann Peng   Centricity Description: CONTUSION, RIGHT RIB Qualifier: Diagnosis of  By: Aline Brochure MD, Dorothyann Peng    .  Depression   . Diverticulitis   . Esophagitis   . ESOPHAGITIS 06/25/2008   Qualifier: History of  By: Nelson-Smith CMA (AAMA), Dottie    . GERD (gastroesophageal reflux disease)   . H/O seasonal allergies   . Heart murmur   . Hiatal hernia   . 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 Hypertension   . Mitral valve prolapse   . Pacemaker    Past Surgical History:  Procedure Laterality Date  . APPENDECTOMY    . BREAST SURGERY Right    biopsy  . COLONOSCOPY  10/19/2004   LEC  . EYE SURGERY Left    cataract extraction with IOL  . LAPAROSCOPIC LOW  ANTERIOR RESECTION  11/16/2013   Robotic  . LAPAROSCOPIC LYSIS OF ADHESIONS N/A 11/15/2013   Procedure: LAPAROSCOPIC LYSIS OF ADHESIONS;  Surgeon: Adin Hector, MD;  Location: [facility];  Service: General;  Laterality: N/A;  . left ovary cyst removal  1972  . [place], 2004, 2012   device dependant  . POLYPECTOMY    . PROCTOSCOPY  11/15/2013   Procedure: RIGID PROCTOSCOPY;  Surgeon: Adin Hector, MD;  Location: [facility];  Service: General;;  . ROBOTIC ASSISTED SALPINGO OOPHERECTOMY Left 11/16/2013   Robotic en bloc w LAR resection  . TUBAL LIGATION    . UPPER GASTROINTESTINAL ENDOSCOPY     Family History  Problem Relation Age of Onset  . Diabetes Mother   . Stroke Mother   . Diabetes Father   . Lung cancer Father   . Lung cancer Maternal Grandfather   . Heart disease Maternal Aunt   . Heart disease Maternal Grandmother   . Breast cancer Maternal Aunt   . Colon cancer Neg Hx   . Esophageal cancer Neg Hx   . Pancreatic cancer Neg Hx   . Rectal cancer Neg Hx   . Stomach cancer Neg Hx    Social History   Occupational History  . REGISTRATION    Social History Main Topics  . Smoking status: Never Smoker  . Smokeless tobacco: Never Used  . Alcohol use No  . Drug use: No  . Sexual activity: Not on file    Tobacco Counseling Counseling given: No   Activities of Daily Living In your present state of health, do you have any difficulty performing the following activities: 12/21/2016  Hearing? Y  Vision? N  Difficulty concentrating or making decisions? N  Walking or climbing stairs? N  Dressing or bathing? N  Doing errands, shopping? N  Some recent data might be hidden    Immunizations and Health Maintenance Immunization History  Administered Date(s) Administered  . Influenza, High Dose Seasonal PF 05/05/2016  . Pneumococcal Conjugate-13 01/03/2015  . Pneumococcal Polysaccharide-23 12/21/2016   Health Maintenance Due  Topic Date Due  . TETANUS/TDAP   09/29/1967  . PNA vac Low Risk Adult (2 of 2 - PPSV23) 01/03/2016   Tdap declined today  Patient Care Team: Theodoro Clock as PCP - General (Physician Assistant) Altamese Dilling, RN as Registered Nurse Lafayette Dragon, MD (Inactive) as Consulting Physician (Gastroenterology) Michael Boston, MD as Consulting Physician (General Surgery) Larey Dresser, MD as Consulting Physician (Cardiology)       Assessment:   This is a routine wellness examination for Carmen Green.   Hearing/Vision screen No hearing deficit noted, however patient reports that she has been evaluated for hearing  loss in the past, and was told she needed hearing aids.  She did not pursue getting hearing aids at the time due to cost.  She states she does not feel her hearing deficit causes her any problems, but will pursue getting hearing aids in the future if this changes.  She sees Dr. Midge Aver yearly and has seen him already this year.  She had bilateral cataracts removed in the past.    Dietary issues and exercise activities discussed: Current Exercise Habits: The patient does not participate in regular exercise at present  Patient reports eating a healthy diet consisting mostly of vegetables, fruits, lean proteins and whole grains  Goals Walk 3 times per week for 30 minutes      Depression Screen PHQ 2/9 Scores 12/21/2016 03/02/2016  PHQ - 2 Score 0 2  PHQ- 9 Score - 4    Fall Risk Fall Risk  12/21/2016 03/02/2016  Falls in the past year? No No    Cognitive Function: MMSE - Mini Mental State Exam 12/21/2016  Orientation to time 5  Orientation to Place 5  Registration 3  Attention/ Calculation 5  Recall 3  Language- name 2 objects 2  Language- repeat 1  Language- follow 3 step command 3  Language- read & follow direction 1  Write a sentence 1  Copy design 1  Total score 30        Screening Tests Health Maintenance  Topic Date Due  . TETANUS/TDAP  09/29/1967  . PNA vac Low Risk Adult (2 of  2 - PPSV23) 01/03/2016  . Hepatitis C Screening  02/24/2018 (Originally 03/18/1951)  . INFLUENZA VACCINE  01/19/2017  . MAMMOGRAM  07/29/2018  . COLONOSCOPY  12/03/2020  . DEXA SCAN  Completed   Tdap declined today  Hep C screening recommended at next visit with Particia Nearing, PA  Dexa scan completed today Pneumovax 23 given today All other screenings up to date   Plan:    I have personally reviewed and noted the following in the patient's chart:   . Medical and social history . Use of alcohol, tobacco or illicit drugs  . Current medications and supplements . Functional ability and status . Nutritional status . Physical activity . Advanced directives . List of other physicians . Hospitalizations, surgeries, and ER visits in previous 12 months . Vitals . Screenings to include cognitive, depression, and falls . Referrals and appointments  In addition, I have reviewed and discussed with patient certain preventive protocols, quality metrics, and best practice recommendations. A written personalized care plan for preventive services as well as general preventive health recommendations were provided to patient.     WYATT, AMY M, RN   12/21/2016   I have reviewed and agree with the above AWV documentation.    Terald Sleeper PA-C [facility] [address]  [place], [place] [address] [phone]

## 2016-12-21 NOTE — Patient Instructions (Addendum)
Thank you for coming in for your Annual Wellness Visit today!    You received your pneumovax vaccine today.  Try to increase your walking during the cooler parts of the day to 30 minutes 3 times per week  Try to include mostly vegetables, fruit, whole grains, and lean proteins in your diet.   Preventive Care 68 Years and Older, Female Preventive care refers to lifestyle choices and visits with your health care provider that can promote health and wellness. What does preventive care include?  A yearly physical exam. This is also called an annual well check.  Dental exams once or twice a year.  Routine eye exams. Ask your health care provider how often you should have your eyes checked.  Personal lifestyle choices, including: ? Daily care of your teeth and gums. ? Regular physical activity. ? Eating a healthy diet. ? Avoiding tobacco and drug use. ? Limiting alcohol use. ? Practicing safe sex. ? Taking low-dose aspirin every day. ? Taking vitamin and mineral supplements as recommended by your health care provider. What happens during an annual well check? The services and screenings done by your health care provider during your annual well check will depend on your age, overall health, lifestyle risk factors, and family history of disease. Counseling Your health care provider may ask you questions about your:  Alcohol use.  Tobacco use.  Drug use.  Emotional well-being.  Home and relationship well-being.  Sexual activity.  Eating habits.  History of falls.  Memory and ability to understand (cognition).  Work and work Statistician.  Reproductive health.  Screening You may have the following tests or measurements:  Height, weight, and BMI.  Blood pressure.  Lipid and cholesterol levels. These may be checked every 5 years, or more frequently if you are over 19 years old.  Skin check.  Lung cancer screening. You may have this screening every year starting at  age 9 if you have a 30-pack-year history of smoking and currently smoke or have quit within the past 15 years.  Fecal occult blood test (FOBT) of the stool. You may have this test every year starting at age 68.  Flexible sigmoidoscopy or colonoscopy. You may have a sigmoidoscopy every 5 years or a colonoscopy every 10 years starting at age 68.  Hepatitis C blood test.  Hepatitis B blood test.  Sexually transmitted disease (STD) testing.  Diabetes screening. This is done by checking your blood sugar (glucose) after you have not eaten for a while (fasting). You may have this done every 1-3 years.  Bone density scan. This is done to screen for osteoporosis. You may have this done starting at age 68.  Mammogram. This may be done every 1-2 years. Talk to your health care provider about how often you should have regular mammograms.  Talk with your health care provider about your test results, treatment options, and if necessary, the need for more tests. Vaccines Your health care provider may recommend certain vaccines, such as:  Influenza vaccine. This is recommended every year.  Tetanus, diphtheria, and acellular pertussis (Tdap, Td) vaccine. You may need a Td booster every 10 years.  Varicella vaccine. You may need this if you have not been vaccinated.  Zoster vaccine. You may need this after age 68.  Measles, mumps, and rubella (MMR) vaccine. You may need at least one dose of MMR if you were born in 1957 or later. You may also need a second dose.  Pneumococcal 13-valent conjugate (PCV13) vaccine. One dose  is recommended after age 68.  Pneumococcal polysaccharide (PPSV23) vaccine. One dose is recommended after age 68.  Meningococcal vaccine. You may need this if you have certain conditions.  Hepatitis A vaccine. You may need this if you have certain conditions or if you travel or work in places where you may be exposed to hepatitis A.  Hepatitis B vaccine. You may need this if  you have certain conditions or if you travel or work in places where you may be exposed to hepatitis B.  Haemophilus influenzae type b (Hib) vaccine. You may need this if you have certain conditions.  Talk to your health care provider about which screenings and vaccines you need and how often you need them. This information is not intended to replace advice given to you by your health care provider. Make sure you discuss any questions you have with your health care provider. Document Released: 07/04/2015 Document Revised: 02/25/2016 Document Reviewed: 04/08/2015 Elsevier Interactive Patient Education  2017 [employer].

## 2017-01-17 ENCOUNTER — Ambulatory Visit (INDEPENDENT_AMBULATORY_CARE_PROVIDER_SITE_OTHER): Payer: Medicare Other | Admitting: *Deleted

## 2017-01-17 DIAGNOSIS — I442 Atrioventricular block, complete: Secondary | ICD-10-CM

## 2017-01-17 NOTE — Progress Notes (Signed)
Remote pacemaker transmission.   

## 2017-01-19 ENCOUNTER — Other Ambulatory Visit: Payer: Self-pay | Admitting: Physician Assistant

## 2017-01-20 NOTE — Telephone Encounter (Signed)
Patient was supposed to come back in May and see Glenard Haring according to the notes and has not seen her since last year. Please have her make an appointment to come be seen for this refill. If she needs enough to get her through that appointment and we can do that.

## 2017-01-20 NOTE — Telephone Encounter (Signed)
Last seen 03/02/16  Carmen Green  If approved route to nurse to call into [facility]

## 2017-01-21 ENCOUNTER — Encounter: Payer: Self-pay | Admitting: Cardiology

## 2017-01-26 ENCOUNTER — Encounter: Payer: Self-pay | Admitting: Physician Assistant

## 2017-01-26 ENCOUNTER — Ambulatory Visit (INDEPENDENT_AMBULATORY_CARE_PROVIDER_SITE_OTHER): Payer: Medicare Other | Admitting: Physician Assistant

## 2017-01-26 VITALS — BP 137/77 | HR 83 | Temp 98.8°F | Ht 62.0 in | Wt 151.0 lb

## 2017-01-26 DIAGNOSIS — J301 Allergic rhinitis due to pollen: Secondary | ICD-10-CM | POA: Diagnosis not present

## 2017-01-26 DIAGNOSIS — F411 Generalized anxiety disorder: Secondary | ICD-10-CM

## 2017-01-26 MED ORDER — LORAZEPAM 1 MG PO TABS: 1.0000 mg | ORAL_TABLET | Freq: Three times a day (TID) | ORAL | 2 refills | Status: DC | PRN

## 2017-01-26 MED ORDER — FLUTICASONE PROPIONATE 50 MCG/ACT NA SUSP: 1.0000 | Freq: Every day | NASAL | 3 refills | Status: DC | PRN

## 2017-01-26 NOTE — Patient Instructions (Signed)
In a few days you may receive a survey in the mail or online from [employer] regarding your visit with us today. Please take a moment to fill this out. Your feedback is very important to our whole office. It can help us better understand your needs as well as improve your experience and satisfaction. Thank you for taking your time to complete it. We care about you.  Ruddy Swire, PA-C  

## 2017-01-26 NOTE — Progress Notes (Signed)
BP 137/77   Pulse 83   Temp 98.8 F (37.1 C) (Oral)   Ht 5\' 2"  (1.575 m)   Wt 151 lb (68.5 kg)   BMI 27.62 kg/m    Subjective:    Patient ID: Carmen Green, female    DOB: 06-Sep-1950, 66 y.o.   MRN: [id number]  HPI: Carmen Green is a 66 y.o. female presenting on 01/26/2017 for Medication Refill (Ativan)  This patient comes in for periodic recheck on medications and conditions including Anxiety. Her husband had had a recent MRI of his lumbar spine due to disc problems. In this scan a lesion was seen on one of his lumbar vertebrae. With his history of prostate cancer, there is concern of it being related. Ever since he had his prostate cancer his PSA has always remained 0. He had a surgery in 2013. She is just quite anxious about the results on this. We are can adjust her medicines for a brief time and then work on reducing it after they have gotten through the next 2 weeks of anxiety.   Reports also that her allergies have been quite bad this summer. There has been a lot of exposure to grass outside. She is having to do some of the yardwork due to her husband's back being bad. She needs a refill on her Flonase.  All medications are reviewed today. There are no reports of any problems with the medications. All of the medical conditions are reviewed and updated.  Lab work is reviewed and will be ordered as medically necessary. There are no new problems reported with today's visit.   Relevant past medical, surgical, family and social history reviewed and updated as indicated. Allergies and medications reviewed and updated.  Past Medical History:  Diagnosis Date  . Allergy   . Anemia   . Anxiety   . Arthritis   . Cataract    bil cataracts removed  . Colon polyp   . Complete heart block ([place])   . Contusion of chest wall 10/10/2007   Centricity Description: CHEST WALL CONTUSION Qualifier: Diagnosis of  By: Aline Brochure MD, Dorothyann Peng   Centricity Description: CONTUSION, RIGHT RIB  Qualifier: Diagnosis of  By: Aline Brochure MD, Dorothyann Peng    . Depression   . Diverticulitis   . Esophagitis   . ESOPHAGITIS 06/25/2008   Qualifier: History of  By: Nelson-Smith CMA (AAMA), Dottie    . GERD (gastroesophageal reflux disease)   . H/O seasonal allergies   . Heart murmur   . Hiatal hernia   . 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 Hypertension   . Mitral valve prolapse   . Pacemaker     Past Surgical History:  Procedure Laterality Date  . APPENDECTOMY    . BREAST SURGERY Right    biopsy  . COLONOSCOPY  10/19/2004   LEC  . EYE SURGERY Left    cataract extraction with IOL  . LAPAROSCOPIC LOW ANTERIOR RESECTION  11/16/2013   Robotic  . LAPAROSCOPIC LYSIS OF ADHESIONS N/A 11/15/2013   Procedure: LAPAROSCOPIC LYSIS OF ADHESIONS;  Surgeon: Adin Hector, MD;  Location: [facility];  Service: General;  Laterality: N/A;  . left ovary cyst removal  1972  . [place], 2004, 2012   device dependant  . POLYPECTOMY    . PROCTOSCOPY  11/15/2013   Procedure: RIGID PROCTOSCOPY;  Surgeon: Adin Hector, MD;  Location:  [facility];  Service: General;;  . ROBOTIC ASSISTED SALPINGO OOPHERECTOMY Left 11/16/2013   Robotic en bloc w LAR resection  . TUBAL LIGATION    . UPPER GASTROINTESTINAL ENDOSCOPY      Review of Systems  Constitutional: Negative.   HENT: Positive for postnasal drip and sneezing.   Eyes: Negative.   Respiratory: Negative.   Gastrointestinal: Negative.   Genitourinary: Negative.   Psychiatric/Behavioral: Negative for sleep disturbance and suicidal ideas. The patient is nervous/anxious.     Allergies as of 01/26/2017      Reactions   Amoxapine And Related    Rash   Cephalexin Rash   Oxycodone    "Loopy"   Penicillins Rash      Medication List       Accurate as of 01/26/17  1:56 PM. Always use your most recent med list.          buPROPion 150 MG 24 hr tablet Commonly known as:   WELLBUTRIN XL Take 2 tablets (300 mg total) by mouth daily.   busPIRone 10 MG tablet Commonly known as:  BUSPAR Take 1 tablet (10 mg total) by mouth 3 (three) times daily.   cetirizine 10 MG tablet Commonly known as:  ZYRTEC Take 10 mg by mouth daily as needed for allergies.   clindamycin 150 MG capsule Commonly known as:  CLEOCIN TAKE 4 CAPSULES 1 HOUR PRIOR TO DENTAL VISIT   estradiol 0.5 MG tablet Commonly known as:  ESTRACE Take 1 tablet (0.5 mg total) by mouth daily.   fluticasone 50 MCG/ACT nasal spray Commonly known as:  FLONASE Place 1 spray into both nostrils daily as needed for allergies.   guaiFENesin 600 MG 12 hr tablet Commonly known as:  MUCINEX Take 600 mg by mouth 2 (two) times daily as needed for cough.   ibuprofen 400 MG tablet Commonly known as:  ADVIL,MOTRIN Take 1-2 tablets (400-800 mg total) by mouth every 6 (six) hours as needed for fever, headache, mild pain or moderate pain.   lisinopril-hydrochlorothiazide 20-12.5 MG tablet Commonly known as:  PRINZIDE,ZESTORETIC Take 1 tablet by mouth every morning.   LORazepam 1 MG tablet Commonly known as:  ATIVAN Take 1 tablet (1 mg total) by mouth every 8 (eight) hours as needed for anxiety.   medroxyPROGESTERone 2.5 MG tablet Commonly known as:  PROVERA Take 1 tablet (2.5 mg total) by mouth daily.   multivitamin tablet Take 1 tablet by mouth daily.   omeprazole 40 MG capsule Commonly known as:  PRILOSEC Take 1 capsule (40 mg total) by mouth daily.   polyethylene glycol powder powder Commonly known as:  GLYCOLAX/MIRALAX Take 1 Container by mouth 4 (four) times a week.   ranitidine 150 MG tablet Commonly known as:  ZANTAC Take 1 tablet (150 mg total) by mouth at bedtime.   Vitamin D3 1000 units Caps Take 1 capsule by mouth daily.          Objective:    BP 137/77   Pulse 83   Temp 98.8 F (37.1 C) (Oral)   Ht 5\' 2"  (1.575 m)   Wt 151 lb (68.5 kg)   BMI 27.62 kg/m   Allergies    Allergen Reactions  . Amoxapine And Related     Rash  . Cephalexin Rash  . Oxycodone     "Loopy"  . Penicillins Rash    Physical Exam  Constitutional: She is oriented to person, place, and time. She appears well-developed and well-nourished.  HENT:  Head: Normocephalic and  atraumatic.  Eyes: Pupils are equal, round, and reactive to light. Conjunctivae and EOM are normal.  Cardiovascular: Normal rate, regular rhythm, normal heart sounds and intact distal pulses.   Pulmonary/Chest: Effort normal and breath sounds normal.  Abdominal: Soft. Bowel sounds are normal.  Neurological: She is alert and oriented to person, place, and time. She has normal reflexes.  Skin: Skin is warm and dry. No rash noted.  Psychiatric: She has a normal mood and affect. Her behavior is normal. Judgment and thought content normal.  Nursing note and vitals reviewed.       Assessment & Plan:   1. Allergic rhinitis due to pollen, unspecified seasonality - fluticasone (FLONASE) 50 MCG/ACT nasal spray; Place 1 spray into both nostrils daily as needed for allergies.  Dispense: 48 g; Refill: 3  2. Anxiety state - LORazepam (ATIVAN) 1 MG tablet; Take 1 tablet (1 mg total) by mouth every 8 (eight) hours as needed for anxiety.  Dispense: 90 tablet; Refill: 2   Current Outpatient Prescriptions:  .  buPROPion (WELLBUTRIN XL) 150 MG 24 hr tablet, Take 2 tablets (300 mg total) by mouth daily., Disp: 180 tablet, Rfl: 3 .  busPIRone (BUSPAR) 10 MG tablet, Take 1 tablet (10 mg total) by mouth 3 (three) times daily., Disp: 270 tablet, Rfl: 0 .  cetirizine (ZYRTEC) 10 MG tablet, Take 10 mg by mouth daily as needed for allergies. , Disp: , Rfl:  .  Cholecalciferol (VITAMIN D3) 1000 units CAPS, Take 1 capsule by mouth daily., Disp: , Rfl:  .  clindamycin (CLEOCIN) 150 MG capsule, TAKE 4 CAPSULES 1 HOUR PRIOR TO DENTAL VISIT, Disp: 4 capsule, Rfl: 0 .  estradiol (ESTRACE) 0.5 MG tablet, Take 1 tablet (0.5 mg total) by mouth  daily., Disp: 90 tablet, Rfl: 1 .  fluticasone (FLONASE) 50 MCG/ACT nasal spray, Place 1 spray into both nostrils daily as needed for allergies., Disp: 48 g, Rfl: 3 .  guaiFENesin (MUCINEX) 600 MG 12 hr tablet, Take 600 mg by mouth 2 (two) times daily as needed for cough. , Disp: , Rfl:  .  ibuprofen (ADVIL,MOTRIN) 400 MG tablet, Take 1-2 tablets (400-800 mg total) by mouth every 6 (six) hours as needed for fever, headache, mild pain or moderate pain., Disp: 40 tablet, Rfl: 1 .  lisinopril-hydrochlorothiazide (PRINZIDE,ZESTORETIC) 20-12.5 MG tablet, Take 1 tablet by mouth every morning., Disp: 90 tablet, Rfl: 2 .  LORazepam (ATIVAN) 1 MG tablet, Take 1 tablet (1 mg total) by mouth every 8 (eight) hours as needed for anxiety., Disp: 90 tablet, Rfl: 2 .  medroxyPROGESTERone (PROVERA) 2.5 MG tablet, Take 1 tablet (2.5 mg total) by mouth daily., Disp: 90 tablet, Rfl: 1 .  Multiple Vitamin (MULTIVITAMIN) tablet, Take 1 tablet by mouth daily.  , Disp: , Rfl:  .  omeprazole (PRILOSEC) 40 MG capsule, Take 1 capsule (40 mg total) by mouth daily., Disp: 30 capsule, Rfl: 11 .  polyethylene glycol powder (GLYCOLAX/MIRALAX) powder, Take 1 Container by mouth 4 (four) times a week. , Disp: , Rfl:  .  ranitidine (ZANTAC) 150 MG tablet, Take 1 tablet (150 mg total) by mouth at bedtime. (Patient taking differently: Take 150 mg by mouth at bedtime as needed. ), Disp: 30 tablet, Rfl: 3  Continue all other maintenance medications as listed above.  Follow up plan: Return in about 3 months (around 04/28/2017) for keep regular follow up.  Educational handout given for [place] PA-C [facility] [address]  [place], [place] [address] [phone]   01/26/2017, 1:56 PM

## 2017-02-08 ENCOUNTER — Ambulatory Visit (INDEPENDENT_AMBULATORY_CARE_PROVIDER_SITE_OTHER): Payer: Medicare Other | Admitting: Family Medicine

## 2017-02-08 ENCOUNTER — Encounter: Payer: Self-pay | Admitting: Family Medicine

## 2017-02-08 VITALS — BP 155/76 | HR 71 | Temp 98.0°F | Ht 62.0 in | Wt 154.0 lb

## 2017-02-08 DIAGNOSIS — J039 Acute tonsillitis, unspecified: Secondary | ICD-10-CM | POA: Diagnosis not present

## 2017-02-08 MED ORDER — PSEUDOEPHEDRINE-GUAIFENESIN ER 60-600 MG PO TB12: 1.0000 | ORAL_TABLET | Freq: Two times a day (BID) | ORAL | 0 refills | Status: AC

## 2017-02-08 MED ORDER — LEVOFLOXACIN 500 MG PO TABS: 500.0000 mg | ORAL_TABLET | Freq: Every day | ORAL | 0 refills | Status: DC

## 2017-02-08 NOTE — Progress Notes (Signed)
Chief Complaint  Patient presents with  . Sinusitis    pt here today c/o sinus pressure, ear fullness, sore throat, nasal congestion and cough.    HPI  Patient presents today for Symptoms include congestion,Sore throat, facial pain, nasal congestion, non productive cough, post nasal drip and Ear and sinus pressure. There is no fever, chills, or sweats. Onset of symptoms was a few days ago, gradually worsening and crescendoing last evening.   PMH: Smoking status noted ROS: Per HPI  Objective: BP (!) 155/76   Pulse 71   Temp 98 F (36.7 C) (Oral)   Ht 5\' 2"  (1.575 m)   Wt 154 lb (69.9 kg)   BMI 28.17 kg/m  Gen: NAD, alert, cooperative with exam HEENT: NCAT, EOMI, PERRL pharynx has moderate erythema with exudative tonsils left greater than right CV: RRR, good S1/S2, no murmur Resp: CTABL, no wheezes, non-labored Ext: No edema, warm Neuro: Alert and oriented, No gross deficits  Assessment and plan:  1. Tonsillitis     Meds ordered this encounter  Medications  . levofloxacin (LEVAQUIN) 500 MG tablet    Sig: Take 1 tablet (500 mg total) by mouth daily. For 10 days    Dispense:  10 tablet    Refill:  0  . pseudoephedrine-guaifenesin (MUCINEX D) 60-600 MG 12 hr tablet    Sig: Take 1 tablet by mouth every 12 (twelve) hours. As needed for congestion    Dispense:  20 tablet    Refill:  0    No orders of the defined types were placed in this encounter.   Follow up as needed.  Claretta Fraise, MD

## 2017-02-14 LAB — CUP PACEART REMOTE DEVICE CHECK
Battery Remaining Longevity: 88 mo
Battery Remaining Percentage: 65 %
Battery Voltage: 2.9 V
Brady Statistic AS VP Percent: 99 %
Brady Statistic RV Percent Paced: 99 %
Date Time Interrogation Session: 20180730075826
Implantable Lead Implant Date: 19920319
Lead Channel Impedance Value: 460 Ohm
Lead Channel Sensing Intrinsic Amplitude: 3.1 mV
Lead Channel Setting Pacing Amplitude: 1.625
MDC IDC LEAD IMPLANT DT: 19920319
MDC IDC LEAD LOCATION: 753859
MDC IDC LEAD LOCATION: 753860
MDC IDC MSMT LEADCHNL RV IMPEDANCE VALUE: 540 Ohm
MDC IDC MSMT LEADCHNL RV PACING THRESHOLD AMPLITUDE: 1.375 V
MDC IDC MSMT LEADCHNL RV PACING THRESHOLD PULSEWIDTH: 0.5 ms
MDC IDC MSMT LEADCHNL RV SENSING INTR AMPL: 9 mV
MDC IDC PG IMPLANT DT: 20120803
MDC IDC SET LEADCHNL RV PACING PULSEWIDTH: 0.5 ms
MDC IDC SET LEADCHNL RV SENSING SENSITIVITY: 4 mV
MDC IDC STAT BRADY AS VS PERCENT: 1 %
Pulse Gen Model: 2210
Pulse Gen Serial Number: 7252855

## 2017-02-17 ENCOUNTER — Other Ambulatory Visit: Payer: Self-pay | Admitting: Gastroenterology

## 2017-03-21 ENCOUNTER — Ambulatory Visit (INDEPENDENT_AMBULATORY_CARE_PROVIDER_SITE_OTHER): Payer: Medicare Other | Admitting: Physician Assistant

## 2017-03-21 ENCOUNTER — Encounter: Payer: Self-pay | Admitting: Physician Assistant

## 2017-03-21 VITALS — BP 117/68 | HR 71 | Temp 98.4°F | Ht 62.0 in | Wt 152.4 lb

## 2017-03-21 DIAGNOSIS — J359 Chronic disease of tonsils and adenoids, unspecified: Secondary | ICD-10-CM

## 2017-03-21 DIAGNOSIS — J0100 Acute maxillary sinusitis, unspecified: Secondary | ICD-10-CM | POA: Diagnosis not present

## 2017-03-21 MED ORDER — LEVOFLOXACIN 500 MG PO TABS: 500.0000 mg | ORAL_TABLET | Freq: Every day | ORAL | 0 refills | Status: DC

## 2017-03-21 MED ORDER — HYDROCODONE-HOMATROPINE 5-1.5 MG/5ML PO SYRP: 5.0000 mL | ORAL_SOLUTION | Freq: Four times a day (QID) | ORAL | 0 refills | Status: DC | PRN

## 2017-03-21 NOTE — Patient Instructions (Signed)
In a few days you may receive a survey in the mail or online from [employer] regarding your visit with us today. Please take a moment to fill this out. Your feedback is very important to our whole office. It can help us better understand your needs as well as improve your experience and satisfaction. Thank you for taking your time to complete it. We care about you.  Griffyn Kucinski, PA-C  

## 2017-03-22 NOTE — Progress Notes (Signed)
BP 117/68   Pulse 71   Temp 98.4 F (36.9 C) (Oral)   Ht 5\' 2"  (1.575 m)   Wt 152 lb 6.4 oz (69.1 kg)   BMI 27.87 kg/m    Subjective:    Patient ID: Carmen Green, female    DOB: Nov 21, 1950, 66 y.o.   MRN: [id number]  HPI: Carmen Green is a 66 y.o. female presenting on 03/21/2017 for Spot on Tonsil (Dentist recommends ENT); Nasal Congestion; and Cough  This patient has had many days of sinus headache and postnasal drainage. There is copious drainage at times. Denies any fever at this time. There has been a history of sinus infections in the past.  No history of sinus surgery. There is cough at night. It has become more prevalent in recent days.  She has a slightly raised yellow colored lesion on the back left tonsillar pillar. And therefore couple months the dentist noticed at this time. We are going to refer her to ENT for further evaluation.  Relevant past medical, surgical, family and social history reviewed and updated as indicated. Allergies and medications reviewed and updated.  Past Medical History:  Diagnosis Date  . Allergy   . Anemia   . Anxiety   . Arthritis   . Cataract    bil cataracts removed  . Colon polyp   . Complete heart block ([place])   . Contusion of chest wall 10/10/2007   Centricity Description: CHEST WALL CONTUSION Qualifier: Diagnosis of  By: Aline Brochure MD, Dorothyann Peng   Centricity Description: CONTUSION, RIGHT RIB Qualifier: Diagnosis of  By: Aline Brochure MD, Dorothyann Peng    . Depression   . Diverticulitis   . Esophagitis   . ESOPHAGITIS 06/25/2008   Qualifier: History of  By: Nelson-Smith CMA (AAMA), Dottie    . GERD (gastroesophageal reflux disease)   . H/O seasonal allergies   . Heart murmur   . Hiatal hernia   . 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 Hypertension   . Mitral valve prolapse   . Pacemaker     Past Surgical History:  Procedure Laterality Date  . APPENDECTOMY      . BREAST SURGERY Right    biopsy  . COLONOSCOPY  10/19/2004   LEC  . EYE SURGERY Left    cataract extraction with IOL  . LAPAROSCOPIC LOW ANTERIOR RESECTION  11/16/2013   Robotic  . LAPAROSCOPIC LYSIS OF ADHESIONS N/A 11/15/2013   Procedure: LAPAROSCOPIC LYSIS OF ADHESIONS;  Surgeon: Adin Hector, MD;  Location: [facility];  Service: General;  Laterality: N/A;  . left ovary cyst removal  1972  . [place], 2004, 2012   device dependant  . POLYPECTOMY    . PROCTOSCOPY  11/15/2013   Procedure: RIGID PROCTOSCOPY;  Surgeon: Adin Hector, MD;  Location: [facility];  Service: General;;  . ROBOTIC ASSISTED SALPINGO OOPHERECTOMY Left 11/16/2013   Robotic en bloc w LAR resection  . TUBAL LIGATION    . UPPER GASTROINTESTINAL ENDOSCOPY      Review of Systems  Constitutional: Positive for chills and fatigue. Negative for activity change and appetite change.  HENT: Positive for congestion, postnasal drip and sore throat.   Eyes: Negative.   Respiratory: Positive for cough and wheezing.   Cardiovascular: Negative.  Negative for chest pain, palpitations and leg swelling.  Gastrointestinal: Negative.   Genitourinary: Negative.   Musculoskeletal: Negative.   Skin:  Negative.   Neurological: Positive for headaches.    Allergies as of 03/21/2017      Reactions   Amoxapine And Related    Rash   Cephalexin Rash   Oxycodone    "Loopy"   Penicillins Rash      Medication List       Accurate as of 03/21/17 11:59 PM. Always use your most recent med list.          buPROPion 150 MG 24 hr tablet Commonly known as:  WELLBUTRIN XL Take 2 tablets (300 mg total) by mouth daily.   busPIRone 10 MG tablet Commonly known as:  BUSPAR Take 1 tablet (10 mg total) by mouth 3 (three) times daily.   cetirizine 10 MG tablet Commonly known as:  ZYRTEC Take 10 mg by mouth daily as needed for allergies.   clindamycin 150 MG capsule Commonly known as:  CLEOCIN TAKE 4 CAPSULES 1 HOUR PRIOR  TO DENTAL VISIT   estradiol 0.5 MG tablet Commonly known as:  ESTRACE Take 1 tablet (0.5 mg total) by mouth daily.   fluticasone 50 MCG/ACT nasal spray Commonly known as:  FLONASE Place 1 spray into both nostrils daily as needed for allergies.   guaiFENesin 600 MG 12 hr tablet Commonly known as:  MUCINEX Take 600 mg by mouth 2 (two) times daily as needed for cough.   HYDROcodone-homatropine 5-1.5 MG/5ML syrup Commonly known as:  HYCODAN Take 5-10 mLs by mouth every 6 (six) hours as needed.   ibuprofen 400 MG tablet Commonly known as:  ADVIL,MOTRIN Take 1-2 tablets (400-800 mg total) by mouth every 6 (six) hours as needed for fever, headache, mild pain or moderate pain.   levofloxacin 500 MG tablet Commonly known as:  LEVAQUIN Take 1 tablet (500 mg total) by mouth daily.   lisinopril-hydrochlorothiazide 20-12.5 MG tablet Commonly known as:  PRINZIDE,ZESTORETIC Take 1 tablet by mouth every morning.   LORazepam 1 MG tablet Commonly known as:  ATIVAN Take 1 tablet (1 mg total) by mouth every 8 (eight) hours as needed for anxiety.   medroxyPROGESTERone 2.5 MG tablet Commonly known as:  PROVERA Take 1 tablet (2.5 mg total) by mouth daily.   multivitamin tablet Take 1 tablet by mouth daily.   omeprazole 40 MG capsule Commonly known as:  PRILOSEC TAKE (1) CAPSULE DAILY   polyethylene glycol powder powder Commonly known as:  GLYCOLAX/MIRALAX Take 1 Container by mouth 4 (four) times a week.   ranitidine 150 MG tablet Commonly known as:  ZANTAC Take 1 tablet (150 mg total) by mouth at bedtime.   Vitamin D3 1000 units Caps Take 1 capsule by mouth daily.          Objective:    BP 117/68   Pulse 71   Temp 98.4 F (36.9 C) (Oral)   Ht 5\' 2"  (1.575 m)   Wt 152 lb 6.4 oz (69.1 kg)   BMI 27.87 kg/m   Allergies  Allergen Reactions  . Amoxapine And Related     Rash  . Cephalexin Rash  . Oxycodone     "Loopy"  . Penicillins Rash    Physical Exam   Constitutional: She is oriented to person, place, and time. She appears well-developed and well-nourished.  HENT:  Head: Normocephalic and atraumatic.  Right Ear: Tympanic membrane and external ear normal. No middle ear effusion.  Left Ear: Tympanic membrane and external ear normal.  No middle ear effusion.  Nose: Mucosal edema and rhinorrhea present. Right sinus exhibits no  maxillary sinus tenderness. Left sinus exhibits no maxillary sinus tenderness.  Mouth/Throat: Uvula is midline. Posterior oropharyngeal erythema present. No oropharyngeal exudate, posterior oropharyngeal edema or tonsillar abscesses.    Yellow lesion just under the mucosal layer on the left tonsillar pillar  Eyes: Pupils are equal, round, and reactive to light. Conjunctivae and EOM are normal. Right eye exhibits no discharge. Left eye exhibits no discharge.  Neck: Normal range of motion.  Cardiovascular: Normal rate, regular rhythm and normal heart sounds.   Pulmonary/Chest: Effort normal and breath sounds normal. No respiratory distress. She has no wheezes.  Abdominal: Soft.  Lymphadenopathy:    She has no cervical adenopathy.  Neurological: She is alert and oriented to person, place, and time.  Skin: Skin is warm and dry.  Psychiatric: She has a normal mood and affect.  Nursing note and vitals reviewed.       Assessment & Plan:   1. Acute non-recurrent maxillary sinusitis - levofloxacin (LEVAQUIN) 500 MG tablet; Take 1 tablet (500 mg total) by mouth daily.  Dispense: 7 tablet; Refill: 0 - HYDROcodone-homatropine (HYCODAN) 5-1.5 MG/5ML syrup; Take 5-10 mLs by mouth every 6 (six) hours as needed.  Dispense: 240 mL; Refill: 0  2. Lesion of tonsil - Ambulatory referral to ENT    Current Outpatient Prescriptions:  .  buPROPion (WELLBUTRIN XL) 150 MG 24 hr tablet, Take 2 tablets (300 mg total) by mouth daily., Disp: 180 tablet, Rfl: 3 .  busPIRone (BUSPAR) 10 MG tablet, Take 1 tablet (10 mg total) by mouth 3  (three) times daily., Disp: 270 tablet, Rfl: 0 .  cetirizine (ZYRTEC) 10 MG tablet, Take 10 mg by mouth daily as needed for allergies. , Disp: , Rfl:  .  Cholecalciferol (VITAMIN D3) 1000 units CAPS, Take 1 capsule by mouth daily., Disp: , Rfl:  .  clindamycin (CLEOCIN) 150 MG capsule, TAKE 4 CAPSULES 1 HOUR PRIOR TO DENTAL VISIT, Disp: 4 capsule, Rfl: 0 .  estradiol (ESTRACE) 0.5 MG tablet, Take 1 tablet (0.5 mg total) by mouth daily., Disp: 90 tablet, Rfl: 1 .  fluticasone (FLONASE) 50 MCG/ACT nasal spray, Place 1 spray into both nostrils daily as needed for allergies., Disp: 48 g, Rfl: 3 .  guaiFENesin (MUCINEX) 600 MG 12 hr tablet, Take 600 mg by mouth 2 (two) times daily as needed for cough. , Disp: , Rfl:  .  ibuprofen (ADVIL,MOTRIN) 400 MG tablet, Take 1-2 tablets (400-800 mg total) by mouth every 6 (six) hours as needed for fever, headache, mild pain or moderate pain., Disp: 40 tablet, Rfl: 1 .  lisinopril-hydrochlorothiazide (PRINZIDE,ZESTORETIC) 20-12.5 MG tablet, Take 1 tablet by mouth every morning., Disp: 90 tablet, Rfl: 2 .  LORazepam (ATIVAN) 1 MG tablet, Take 1 tablet (1 mg total) by mouth every 8 (eight) hours as needed for anxiety., Disp: 90 tablet, Rfl: 2 .  medroxyPROGESTERone (PROVERA) 2.5 MG tablet, Take 1 tablet (2.5 mg total) by mouth daily., Disp: 90 tablet, Rfl: 1 .  Multiple Vitamin (MULTIVITAMIN) tablet, Take 1 tablet by mouth daily.  , Disp: , Rfl:  .  omeprazole (PRILOSEC) 40 MG capsule, TAKE (1) CAPSULE DAILY, Disp: 30 capsule, Rfl: 0 .  polyethylene glycol powder (GLYCOLAX/MIRALAX) powder, Take 1 Container by mouth 4 (four) times a week. , Disp: , Rfl:  .  ranitidine (ZANTAC) 150 MG tablet, Take 1 tablet (150 mg total) by mouth at bedtime. (Patient taking differently: Take 150 mg by mouth at bedtime as needed. ), Disp: 30 tablet, Rfl: 3 .  HYDROcodone-homatropine (HYCODAN) 5-1.5 MG/5ML syrup, Take 5-10 mLs by mouth every 6 (six) hours as needed., Disp: 240 mL, Rfl: 0 .   levofloxacin (LEVAQUIN) 500 MG tablet, Take 1 tablet (500 mg total) by mouth daily., Disp: 7 tablet, Rfl: 0 Continue all other maintenance medications as listed above.  Follow up plan: Return if symptoms worsen or fail to improve.  Educational handout given for [place] PA-C [place] [address]  [place], [place] [address] [phone]   03/22/2017, 12:52 PM

## 2017-03-24 ENCOUNTER — Other Ambulatory Visit: Payer: Self-pay | Admitting: Gastroenterology

## 2017-04-04 ENCOUNTER — Ambulatory Visit (INDEPENDENT_AMBULATORY_CARE_PROVIDER_SITE_OTHER): Payer: Medicare Other | Admitting: Otolaryngology

## 2017-04-04 DIAGNOSIS — K098 Other cysts of oral region, not elsewhere classified: Secondary | ICD-10-CM

## 2017-04-18 ENCOUNTER — Ambulatory Visit (INDEPENDENT_AMBULATORY_CARE_PROVIDER_SITE_OTHER): Payer: Medicare Other | Admitting: *Deleted

## 2017-04-18 ENCOUNTER — Other Ambulatory Visit: Payer: Self-pay | Admitting: Gastroenterology

## 2017-04-18 DIAGNOSIS — I442 Atrioventricular block, complete: Secondary | ICD-10-CM | POA: Diagnosis not present

## 2017-04-18 NOTE — Progress Notes (Signed)
Remote pacemaker transmission.   

## 2017-04-19 ENCOUNTER — Other Ambulatory Visit: Payer: Self-pay | Admitting: *Deleted

## 2017-04-19 MED ORDER — ESTRADIOL 0.5 MG PO TABS: 0.5000 mg | ORAL_TABLET | Freq: Every day | ORAL | 0 refills | Status: DC

## 2017-04-19 MED ORDER — LISINOPRIL-HYDROCHLOROTHIAZIDE 20-12.5 MG PO TABS: 1.0000 | ORAL_TABLET | Freq: Every morning | ORAL | 0 refills | Status: DC

## 2017-04-19 MED ORDER — MEDROXYPROGESTERONE ACETATE 2.5 MG PO TABS: 2.5000 mg | ORAL_TABLET | Freq: Every day | ORAL | 0 refills | Status: DC

## 2017-04-21 LAB — CUP PACEART REMOTE DEVICE CHECK
Battery Remaining Percentage: 57 %
Battery Voltage: 2.89 V
Brady Statistic AS VP Percent: 97 %
Date Time Interrogation Session: 20181029063153
Implantable Lead Implant Date: 19920319
Lead Channel Impedance Value: 530 Ohm
Lead Channel Pacing Threshold Amplitude: 1.5 V
Lead Channel Setting Sensing Sensitivity: 4 mV
MDC IDC LEAD IMPLANT DT: 19920319
MDC IDC LEAD LOCATION: 753859
MDC IDC LEAD LOCATION: 753860
MDC IDC MSMT BATTERY REMAINING LONGEVITY: 77 mo
MDC IDC MSMT LEADCHNL RA IMPEDANCE VALUE: 460 Ohm
MDC IDC MSMT LEADCHNL RA SENSING INTR AMPL: 1.9 mV
MDC IDC MSMT LEADCHNL RV PACING THRESHOLD PULSEWIDTH: 0.5 ms
MDC IDC MSMT LEADCHNL RV SENSING INTR AMPL: 7.3 mV
MDC IDC PG IMPLANT DT: 20120803
MDC IDC SET LEADCHNL RV PACING AMPLITUDE: 1.75 V
MDC IDC SET LEADCHNL RV PACING PULSEWIDTH: 0.5 ms
MDC IDC STAT BRADY AS VS PERCENT: 1 %
MDC IDC STAT BRADY RV PERCENT PACED: 98 %
Pulse Gen Model: 2210
Pulse Gen Serial Number: 7252855

## 2017-04-26 ENCOUNTER — Encounter: Payer: Self-pay | Admitting: Cardiology

## 2017-05-19 ENCOUNTER — Ambulatory Visit (INDEPENDENT_AMBULATORY_CARE_PROVIDER_SITE_OTHER): Payer: Medicare Other | Admitting: *Deleted

## 2017-05-19 DIAGNOSIS — Z23 Encounter for immunization: Secondary | ICD-10-CM | POA: Diagnosis not present

## 2017-05-27 ENCOUNTER — Other Ambulatory Visit: Payer: Self-pay | Admitting: Gastroenterology

## 2017-06-20 ENCOUNTER — Other Ambulatory Visit: Payer: Self-pay | Admitting: Physician Assistant

## 2017-06-20 DIAGNOSIS — Z1231 Encounter for screening mammogram for malignant neoplasm of breast: Secondary | ICD-10-CM

## 2017-06-23 ENCOUNTER — Other Ambulatory Visit: Payer: Self-pay | Admitting: Gastroenterology

## 2017-07-06 ENCOUNTER — Ambulatory Visit: Payer: Medicare Other | Admitting: Physician Assistant

## 2017-07-06 ENCOUNTER — Encounter: Payer: Self-pay | Admitting: Physician Assistant

## 2017-07-06 VITALS — BP 126/75 | HR 74 | Temp 97.9°F | Ht 62.0 in | Wt 148.4 lb

## 2017-07-06 DIAGNOSIS — M150 Primary generalized (osteo)arthritis: Secondary | ICD-10-CM | POA: Diagnosis not present

## 2017-07-06 DIAGNOSIS — Z0000 Encounter for general adult medical examination without abnormal findings: Secondary | ICD-10-CM

## 2017-07-06 DIAGNOSIS — M159 Polyosteoarthritis, unspecified: Secondary | ICD-10-CM

## 2017-07-06 DIAGNOSIS — F411 Generalized anxiety disorder: Secondary | ICD-10-CM

## 2017-07-06 DIAGNOSIS — I10 Essential (primary) hypertension: Secondary | ICD-10-CM | POA: Diagnosis not present

## 2017-07-06 DIAGNOSIS — M8949 Other hypertrophic osteoarthropathy, multiple sites: Secondary | ICD-10-CM

## 2017-07-06 MED ORDER — MELOXICAM 7.5 MG PO TABS: 7.5000 mg | ORAL_TABLET | Freq: Every day | ORAL | 5 refills | Status: DC

## 2017-07-06 NOTE — Progress Notes (Signed)
BP 126/75   Pulse 74   Temp 97.9 F (36.6 C) (Oral)   Ht '5\' 2"'  (1.575 m)   Wt 148 lb 6.4 oz (67.3 kg)   BMI 27.14 kg/m    Subjective:    Patient ID: Carmen Green, female    DOB: [birth year], 67 y.o.   MRN: [id number]  HPI: Carmen Green is a 67 y.o. female presenting on 07/06/2017 for Follow-up (3 month )  Patient comes in for follow-up on her medications.  She is doing well with her meds and the anxiety been on back order and she has done fairly well without it.  She only uses an Ativan occasionally during the day.  She does take half to 1 at bedtime every night.  She is having significant joint pain related to arthritis.  She does take ibuprofen at times for this.  We discussed not taking NSAIDs chronically related to heart reasons and she understands.  We will give her a supply of Mobic 7.5 mg to take 1 daily just for a few days when she is having a significant bout of arthritis.  She does go for cardiology evaluation later this month this is her normal check.  She has had increased hearing loss.  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  Going to the audiologist has been lost in follow-up.  We will have a referral made for her.  Relevant past medical, surgical, family and social history reviewed and updated as indicated. Allergies and medications reviewed and updated.  Past Medical History:  Diagnosis Date  . Allergy   . Anemia   . Anxiety   . Arthritis   . Cataract    bil cataracts removed  . Colon polyp   . Complete heart block ([place])   . Contusion of chest wall 10/10/2007   Centricity Description: CHEST WALL CONTUSION Qualifier: Diagnosis of  By: Aline Brochure MD, Dorothyann Peng   Centricity Description: CONTUSION, RIGHT RIB Qualifier: Diagnosis of  By: Aline Brochure MD, Dorothyann Peng    . Depression   . Diverticulitis   . Esophagitis   . ESOPHAGITIS 06/25/2008   Qualifier: History of  By: Nelson-Smith CMA (AAMA),  Dottie    . GERD (gastroesophageal reflux disease)   . H/O seasonal allergies   . Heart murmur   . Hiatal hernia   . 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 Hypertension   . Mitral valve prolapse   . Pacemaker     Past Surgical History:  Procedure Laterality Date  . APPENDECTOMY    . BREAST SURGERY Right    biopsy  . COLONOSCOPY  10/19/2004   LEC  . EYE SURGERY Left    cataract extraction with IOL  . LAPAROSCOPIC LOW ANTERIOR RESECTION  11/16/2013   Robotic  . LAPAROSCOPIC LYSIS OF ADHESIONS N/A 11/15/2013   Procedure: LAPAROSCOPIC LYSIS OF ADHESIONS;  Surgeon: Adin Hector, MD;  Location: [facility];  Service: General;  Laterality: N/A;  . left ovary cyst removal  1972  . [place], 2004, 2012   device dependant  . POLYPECTOMY    . PROCTOSCOPY  11/15/2013   Procedure: RIGID PROCTOSCOPY;  Surgeon: Adin Hector, MD;  Location: [facility];  Service: General;;  . ROBOTIC ASSISTED SALPINGO OOPHERECTOMY Left 11/16/2013   Robotic en bloc w LAR resection  .  TUBAL LIGATION    . UPPER GASTROINTESTINAL ENDOSCOPY      Review of Systems  Constitutional: Negative.  Negative for activity change, fatigue and fever.  HENT: Positive for hearing loss.   Eyes: Negative.   Respiratory: Negative for cough, shortness of breath and wheezing.   Cardiovascular: Negative.  Negative for chest pain.  Gastrointestinal: Negative.  Negative for abdominal pain.  Endocrine: Negative.   Genitourinary: Negative.  Negative for dysuria.  Musculoskeletal: Positive for arthralgias and joint swelling.  Skin: Negative.   Neurological: Negative.     Allergies as of 07/06/2017      Reactions   Amoxapine And Related    Rash   Cephalexin Rash   Oxycodone    "Loopy"   Penicillins Rash      Medication List        Accurate as of 07/06/17 12:58 PM. Always use your most recent med list.          buPROPion 150 MG 24 hr  tablet Commonly known as:  WELLBUTRIN XL Take 2 tablets (300 mg total) by mouth daily.   cetirizine 10 MG tablet Commonly known as:  ZYRTEC Take 10 mg by mouth daily as needed for allergies.   clindamycin 150 MG capsule Commonly known as:  CLEOCIN TAKE 4 CAPSULES 1 HOUR PRIOR TO DENTAL VISIT   estradiol 0.5 MG tablet Commonly known as:  ESTRACE Take 1 tablet (0.5 mg total) by mouth daily.   fluticasone 50 MCG/ACT nasal spray Commonly known as:  FLONASE Place 1 spray into both nostrils daily as needed for allergies.   guaiFENesin 600 MG 12 hr tablet Commonly known as:  MUCINEX Take 600 mg by mouth 2 (two) times daily as needed for cough.   lisinopril-hydrochlorothiazide 20-12.5 MG tablet Commonly known as:  PRINZIDE,ZESTORETIC Take 1 tablet by mouth every morning.   LORazepam 1 MG tablet Commonly known as:  ATIVAN Take 1 tablet (1 mg total) by mouth every 8 (eight) hours as needed for anxiety.   medroxyPROGESTERone 2.5 MG tablet Commonly known as:  PROVERA Take 1 tablet (2.5 mg total) by mouth daily.   meloxicam 7.5 MG tablet Commonly known as:  MOBIC Take 1 tablet (7.5 mg total) by mouth daily.   multivitamin tablet Take 1 tablet by mouth daily.   omeprazole 40 MG capsule Commonly known as:  PRILOSEC TAKE (1) CAPSULE DAILY   polyethylene glycol powder powder Commonly known as:  GLYCOLAX/MIRALAX Take 1 Container by mouth 4 (four) times a week.   ranitidine 150 MG tablet Commonly known as:  ZANTAC Take 1 tablet (150 mg total) by mouth at bedtime.   Vitamin D3 1000 units Caps Take 1 capsule by mouth daily.          Objective:    BP 126/75   Pulse 74   Temp 97.9 F (36.6 C) (Oral)   Ht '5\' 2"'  (1.575 m)   Wt 148 lb 6.4 oz (67.3 kg)   BMI 27.14 kg/m   Allergies  Allergen Reactions  . Amoxapine And Related     Rash  . Cephalexin Rash  . Oxycodone     "Loopy"  . Penicillins Rash    Physical Exam  Constitutional: She is oriented to person,  place, and time. She appears well-developed and well-nourished.  HENT:  Head: Normocephalic and atraumatic.  Right Ear: Tympanic membrane, external ear and ear canal normal.  Left Ear: Tympanic membrane, external ear and ear canal normal.  Nose: Nose normal. No rhinorrhea.  Mouth/Throat: Oropharynx is clear and moist and mucous membranes are normal. No oropharyngeal exudate or posterior oropharyngeal erythema.  Eyes: Conjunctivae and EOM are normal. Pupils are equal, round, and reactive to light.  Neck: Normal range of motion. Neck supple.  Cardiovascular: Normal rate, regular rhythm, normal heart sounds and intact distal pulses.  Pulmonary/Chest: Effort normal and breath sounds normal.  Abdominal: Soft. Bowel sounds are normal.  Neurological: She is alert and oriented to person, place, and time. She has normal reflexes.  Skin: Skin is warm and dry. No rash noted.  Psychiatric: She has a normal mood and affect. Her behavior is normal. Judgment and thought content normal.        Assessment & Plan:   1. Essential hypertension  2. Anxiety state  3. Primary osteoarthritis involving multiple joints USE JUST FEW DAYS AS NEEDED FOR SEVERE EPISODES Stop ibuprofen for the joints - meloxicam (MOBIC) 7.5 MG tablet; Take 1 tablet (7.5 mg total) by mouth daily.  Dispense: 30 tablet; Refill: 5  4. Well adult exam - CBC with Differential/Platelet - CMP14+EGFR - Lipid panel - TSH - VITAMIN D 25 Hydroxy (Vit-D Deficiency, Fractures)     Current Outpatient Medications:  .  buPROPion (WELLBUTRIN XL) 150 MG 24 hr tablet, Take 2 tablets (300 mg total) by mouth daily., Disp: 180 tablet, Rfl: 3 .  cetirizine (ZYRTEC) 10 MG tablet, Take 10 mg by mouth daily as needed for allergies. , Disp: , Rfl:  .  Cholecalciferol (VITAMIN D3) 1000 units CAPS, Take 1 capsule by mouth daily., Disp: , Rfl:  .  clindamycin (CLEOCIN) 150 MG capsule, TAKE 4 CAPSULES 1 HOUR PRIOR TO DENTAL VISIT, Disp: 4 capsule,  Rfl: 0 .  estradiol (ESTRACE) 0.5 MG tablet, Take 1 tablet (0.5 mg total) by mouth daily., Disp: 90 tablet, Rfl: 0 .  fluticasone (FLONASE) 50 MCG/ACT nasal spray, Place 1 spray into both nostrils daily as needed for allergies., Disp: 48 g, Rfl: 3 .  guaiFENesin (MUCINEX) 600 MG 12 hr tablet, Take 600 mg by mouth 2 (two) times daily as needed for cough. , Disp: , Rfl:  .  lisinopril-hydrochlorothiazide (PRINZIDE,ZESTORETIC) 20-12.5 MG tablet, Take 1 tablet by mouth every morning., Disp: 90 tablet, Rfl: 0 .  LORazepam (ATIVAN) 1 MG tablet, Take 1 tablet (1 mg total) by mouth every 8 (eight) hours as needed for anxiety., Disp: 90 tablet, Rfl: 2 .  medroxyPROGESTERone (PROVERA) 2.5 MG tablet, Take 1 tablet (2.5 mg total) by mouth daily., Disp: 90 tablet, Rfl: 0 .  meloxicam (MOBIC) 7.5 MG tablet, Take 1 tablet (7.5 mg total) by mouth daily., Disp: 30 tablet, Rfl: 5 .  Multiple Vitamin (MULTIVITAMIN) tablet, Take 1 tablet by mouth daily.  , Disp: , Rfl:  .  omeprazole (PRILOSEC) 40 MG capsule, TAKE (1) CAPSULE DAILY, Disp: 30 capsule, Rfl: 0 .  polyethylene glycol powder (GLYCOLAX/MIRALAX) powder, Take 1 Container by mouth 4 (four) times a week. , Disp: , Rfl:  .  ranitidine (ZANTAC) 150 MG tablet, Take 1 tablet (150 mg total) by mouth at bedtime. (Patient taking differently: Take 150 mg by mouth at bedtime as needed. ), Disp: 30 tablet, Rfl: 3 Continue all other maintenance medications as listed above.  Follow up plan: No Follow-up on file.  Educational handout given for [place] PA-C [place] [address]  [place], [place] [address] [phone]   07/06/2017, 12:58 PM

## 2017-07-06 NOTE — Patient Instructions (Signed)
In a few days you may receive a survey in the mail or online from [employer] regarding your visit with us today. Please take a moment to fill this out. Your feedback is very important to our whole office. It can help us better understand your needs as well as improve your experience and satisfaction. Thank you for taking your time to complete it. We care about you.  Kelty Szafran, PA-C  

## 2017-07-07 LAB — TSH: TSH: 1.84 u[IU]/mL (ref 0.450–4.500)

## 2017-07-07 LAB — CBC WITH DIFFERENTIAL/PLATELET
BASOS: 3 %
Basophils Absolute: 0.1 10*3/uL (ref 0.0–0.2)
EOS (ABSOLUTE): 0.1 10*3/uL (ref 0.0–0.4)
EOS: 2 %
HEMATOCRIT: 40.3 % (ref 34.0–46.6)
HEMOGLOBIN: 13.8 g/dL (ref 11.1–15.9)
Immature Grans (Abs): 0 10*3/uL (ref 0.0–0.1)
Immature Granulocytes: 0 %
LYMPHS ABS: 1.2 10*3/uL (ref 0.7–3.1)
Lymphs: 31 %
MCH: 32.2 pg (ref 26.6–33.0)
MCHC: 34.2 g/dL (ref 31.5–35.7)
MCV: 94 fL (ref 79–97)
MONOCYTES: 12 %
MONOS ABS: 0.4 10*3/uL (ref 0.1–0.9)
Neutrophils Absolute: 2 10*3/uL (ref 1.4–7.0)
Neutrophils: 52 %
Platelets: 279 10*3/uL (ref 150–379)
RBC: 4.28 x10E6/uL (ref 3.77–5.28)
RDW: 13.1 % (ref 12.3–15.4)
WBC: 3.8 10*3/uL (ref 3.4–10.8)

## 2017-07-07 LAB — LIPID PANEL
CHOL/HDL RATIO: 3.7 ratio (ref 0.0–4.4)
Cholesterol, Total: 184 mg/dL (ref 100–199)
HDL: 50 mg/dL (ref >39)
LDL CALC: 115 mg/dL — AB (ref 0–99)
Triglycerides: 96 mg/dL (ref 0–149)
VLDL Cholesterol Cal: 19 mg/dL (ref 5–40)

## 2017-07-07 LAB — CMP14+EGFR
A/G RATIO: 1.9 (ref 1.2–2.2)
ALBUMIN: 4.9 g/dL — AB (ref 3.6–4.8)
ALK PHOS: 49 IU/L (ref 39–117)
ALT: 15 IU/L (ref 0–32)
AST: 22 IU/L (ref 0–40)
BUN / CREAT RATIO: 8 — AB (ref 12–28)
BUN: 8 mg/dL (ref 8–27)
Bilirubin Total: 0.5 mg/dL (ref 0.0–1.2)
CALCIUM: 9.7 mg/dL (ref 8.7–10.3)
CO2: 24 mmol/L (ref 20–29)
CREATININE: 0.99 mg/dL (ref 0.57–1.00)
Chloride: 96 mmol/L (ref 96–106)
GFR calc Af Amer: 68 mL/min/{1.73_m2} (ref >59)
GFR, EST NON AFRICAN AMERICAN: 59 mL/min/{1.73_m2} — AB (ref >59)
GLOBULIN, TOTAL: 2.6 g/dL (ref 1.5–4.5)
Glucose: 95 mg/dL (ref 65–99)
POTASSIUM: 5 mmol/L (ref 3.5–5.2)
SODIUM: 135 mmol/L (ref 134–144)
Total Protein: 7.5 g/dL (ref 6.0–8.5)

## 2017-07-07 LAB — VITAMIN D 25 HYDROXY (VIT D DEFICIENCY, FRACTURES): Vit D, 25-Hydroxy: 50.9 ng/mL (ref 30.0–100.0)

## 2017-07-12 ENCOUNTER — Other Ambulatory Visit: Payer: Self-pay | Admitting: Physician Assistant

## 2017-07-12 DIAGNOSIS — H9193 Unspecified hearing loss, bilateral: Secondary | ICD-10-CM

## 2017-07-13 ENCOUNTER — Other Ambulatory Visit: Payer: Self-pay | Admitting: *Deleted

## 2017-07-13 MED ORDER — ESTRADIOL 0.5 MG PO TABS: 0.5000 mg | ORAL_TABLET | Freq: Every day | ORAL | 3 refills | Status: DC

## 2017-07-13 MED ORDER — MEDROXYPROGESTERONE ACETATE 2.5 MG PO TABS: 2.5000 mg | ORAL_TABLET | Freq: Every day | ORAL | 3 refills | Status: DC

## 2017-07-13 MED ORDER — LISINOPRIL-HYDROCHLOROTHIAZIDE 20-12.5 MG PO TABS: 1.0000 | ORAL_TABLET | Freq: Every morning | ORAL | 1 refills | Status: DC

## 2017-07-18 ENCOUNTER — Ambulatory Visit (INDEPENDENT_AMBULATORY_CARE_PROVIDER_SITE_OTHER): Payer: Medicare Other | Admitting: *Deleted

## 2017-07-18 DIAGNOSIS — I442 Atrioventricular block, complete: Secondary | ICD-10-CM | POA: Diagnosis not present

## 2017-07-18 NOTE — Progress Notes (Signed)
Remote pacemaker transmission.   

## 2017-07-20 ENCOUNTER — Encounter: Payer: Self-pay | Admitting: Cardiology

## 2017-07-20 ENCOUNTER — Encounter: Payer: Medicare Other | Admitting: Internal Medicine

## 2017-07-21 LAB — CUP PACEART REMOTE DEVICE CHECK
Battery Voltage: 2.87 V
Brady Statistic AS VS Percent: 1.1 %
Date Time Interrogation Session: 20190128090004
Implantable Lead Location: 753859
Implantable Lead Location: 753860
Implantable Pulse Generator Implant Date: 20120803
Lead Channel Impedance Value: 460 Ohm
Lead Channel Impedance Value: 560 Ohm
Lead Channel Pacing Threshold Amplitude: 1.5 V
Lead Channel Pacing Threshold Pulse Width: 0.5 ms
Lead Channel Sensing Intrinsic Amplitude: 9.4 mV
Lead Channel Setting Pacing Pulse Width: 0.5 ms
Lead Channel Setting Sensing Sensitivity: 4 mV
MDC IDC LEAD IMPLANT DT: 19920319
MDC IDC LEAD IMPLANT DT: 19920319
MDC IDC MSMT BATTERY REMAINING LONGEVITY: 70 mo
MDC IDC MSMT BATTERY REMAINING PERCENTAGE: 51 %
MDC IDC MSMT LEADCHNL RA SENSING INTR AMPL: 3 mV
MDC IDC SET LEADCHNL RV PACING AMPLITUDE: 1.75 V
MDC IDC STAT BRADY AS VP PERCENT: 97 %
MDC IDC STAT BRADY RV PERCENT PACED: 97 %
Pulse Gen Model: 2210
Pulse Gen Serial Number: 7252855

## 2017-07-30 ENCOUNTER — Other Ambulatory Visit: Payer: Self-pay | Admitting: Gastroenterology

## 2017-08-01 ENCOUNTER — Ambulatory Visit
Admission: RE | Admit: 2017-08-01 | Discharge: 2017-08-01 | Disposition: A | Discharge status: Home / Self Care | Payer: Medicare Other | Source: Ambulatory Visit | Attending: Physician Assistant | Admitting: Physician Assistant

## 2017-08-01 DIAGNOSIS — Z1231 Encounter for screening mammogram for malignant neoplasm of breast: Secondary | ICD-10-CM

## 2017-08-04 ENCOUNTER — Encounter: Payer: Self-pay | Admitting: Internal Medicine

## 2017-08-04 ENCOUNTER — Ambulatory Visit: Payer: Medicare Other | Admitting: Internal Medicine

## 2017-08-04 VITALS — BP 124/70 | HR 78 | Ht 62.0 in | Wt 149.0 lb

## 2017-08-04 DIAGNOSIS — I340 Nonrheumatic mitral (valve) insufficiency: Secondary | ICD-10-CM | POA: Diagnosis not present

## 2017-08-04 DIAGNOSIS — I442 Atrioventricular block, complete: Secondary | ICD-10-CM

## 2017-08-04 DIAGNOSIS — Z950 Presence of cardiac pacemaker: Secondary | ICD-10-CM

## 2017-08-04 LAB — CUP PACEART INCLINIC DEVICE CHECK
Battery Voltage: 2.87 V
Brady Statistic RA Percent Paced: 0 %
Brady Statistic RV Percent Paced: 97 %
Date Time Interrogation Session: 20190214151620
Implantable Pulse Generator Implant Date: 20120803
Lead Channel Impedance Value: 437.5 Ohm
Lead Channel Impedance Value: 537.5 Ohm
Lead Channel Pacing Threshold Amplitude: 1.5 V
Lead Channel Pacing Threshold Pulse Width: 0.5 ms
Lead Channel Sensing Intrinsic Amplitude: 3 mV
Lead Channel Setting Sensing Sensitivity: 4 mV
MDC IDC LEAD IMPLANT DT: 19920319
MDC IDC LEAD IMPLANT DT: 19920319
MDC IDC LEAD LOCATION: 753859
MDC IDC LEAD LOCATION: 753860
MDC IDC MSMT BATTERY REMAINING LONGEVITY: 63 mo
MDC IDC MSMT LEADCHNL RV PACING THRESHOLD AMPLITUDE: 1.5 V
MDC IDC MSMT LEADCHNL RV PACING THRESHOLD PULSEWIDTH: 0.5 ms
MDC IDC MSMT LEADCHNL RV SENSING INTR AMPL: 9.2 mV
MDC IDC SET LEADCHNL RV PACING AMPLITUDE: 1.625
MDC IDC SET LEADCHNL RV PACING PULSEWIDTH: 0.5 ms
Pulse Gen Model: 2210
Pulse Gen Serial Number: 7252855

## 2017-08-04 NOTE — Patient Instructions (Addendum)
Medication Instructions:  Your physician recommends that you continue on your current medications as directed. Please refer to the Current Medication list given to you today.  Labwork: None ordered.  Testing/Procedures: Your physician has requested that you have an echocardiogram. Echocardiography is a painless test that uses sound waves to create images of your heart. It provides your doctor with information about the size and shape of your heart and how well your heart's chambers and valves are working. This procedure takes approximately one hour. There are no restrictions for this procedure.  Please schedule for an ECHO.  Follow-Up: Your physician wants you to follow-up in: one year with Chanetta Marshall, NP.   You will receive a reminder letter in the mail two months in advance. If you don't receive a letter, please call our office to schedule the follow-up appointment.  Remote monitoring is used to monitor your Pacemaker from home. This monitoring reduces the number of office visits required to check your device to one time per year. It allows [place] to keep an eye on the functioning of your device to ensure it is working properly. You are scheduled for a device check from home on 10/17/2017. You may send your transmission at any time that day. If you have a wireless device, the transmission will be sent automatically. After your physician reviews your transmission, you will receive a postcard with your next transmission date.  Any Other Special Instructions Will Be Listed Below (If Applicable).  If you need a refill on your cardiac medications before your next appointment, please call your pharmacy.

## 2017-08-04 NOTE — Progress Notes (Signed)
PCP: Terald Sleeper, PA-C Primary Cardiologist:  Dr Aundra Dubin Primary EP:  Dr Rayann Heman  Carmen Green is a 67 y.o. female who presents today for routine electrophysiology followup.  Since last being seen in our clinic, the patient reports doing very well.  Her primary issue is with reduced hearing.  She thinks she may need hearing aides.  Today, she denies symptoms of palpitations, chest pain, shortness of breath,  lower extremity edema, dizziness, presyncope, or syncope.  The patient is otherwise without complaint today.   Past Medical History:  Diagnosis Date  . Allergy   . Anemia   . Anxiety   . Arthritis   . Cataract    bil cataracts removed  . Colon polyp   . Complete heart block ([place])   . Contusion of chest wall 10/10/2007   Centricity Description: CHEST WALL CONTUSION Qualifier: Diagnosis of  By: Aline Brochure MD, Dorothyann Peng   Centricity Description: CONTUSION, RIGHT RIB Qualifier: Diagnosis of  By: Aline Brochure MD, Dorothyann Peng    . Depression   . Diverticulitis   . Esophagitis   . ESOPHAGITIS 06/25/2008   Qualifier: History of  By: Nelson-Smith CMA (AAMA), Dottie    . GERD (gastroesophageal reflux disease)   . H/O seasonal allergies   . Heart murmur   . Hiatal hernia   . 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 Hypertension   . Mitral valve prolapse   . Pacemaker    Past Surgical History:  Procedure Laterality Date  . APPENDECTOMY    . BREAST SURGERY Right    biopsy  . COLONOSCOPY  10/19/2004   LEC  . EYE SURGERY Left    cataract extraction with IOL  . LAPAROSCOPIC LOW ANTERIOR RESECTION  11/16/2013   Robotic  . LAPAROSCOPIC LYSIS OF ADHESIONS N/A 11/15/2013   Procedure: LAPAROSCOPIC LYSIS OF ADHESIONS;  Surgeon: Adin Hector, MD;  Location: [facility];  Service: General;  Laterality: N/A;  . left ovary cyst removal  1972  . [place], 2004, 2012   device dependant  . POLYPECTOMY    .  PROCTOSCOPY  11/15/2013   Procedure: RIGID PROCTOSCOPY;  Surgeon: Adin Hector, MD;  Location: [facility];  Service: General;;  . ROBOTIC ASSISTED SALPINGO OOPHERECTOMY Left 11/16/2013   Robotic en bloc w LAR resection  . TUBAL LIGATION    . UPPER GASTROINTESTINAL ENDOSCOPY      ROS- all systems are reviewed and negative except as per HPI above  Current Outpatient Medications  Medication Sig Dispense Refill  . buPROPion (WELLBUTRIN XL) 150 MG 24 hr tablet Take 2 tablets (300 mg total) by mouth daily. 180 tablet 3  . cetirizine (ZYRTEC) 10 MG tablet Take 10 mg by mouth daily as needed for allergies.     . Cholecalciferol (VITAMIN D3) 1000 units CAPS Take 1 capsule by mouth daily.    . clindamycin (CLEOCIN) 150 MG capsule TAKE 4 CAPSULES 1 HOUR PRIOR TO DENTAL VISIT 4 capsule 0  . estradiol (ESTRACE) 0.5 MG tablet Take 1 tablet (0.5 mg total) by mouth daily. 90 tablet 3  . fluticasone (FLONASE) 50 MCG/ACT nasal spray Place 1 spray into both nostrils daily as needed for allergies. 48 g 3  . guaiFENesin (MUCINEX) 600 MG 12 hr tablet Take 600 mg by mouth 2 (two) times daily as needed for cough.     Marland Kitchen lisinopril-hydrochlorothiazide (PRINZIDE,ZESTORETIC) 20-12.5 MG tablet  Take 1 tablet by mouth every morning. 90 tablet 1  . LORazepam (ATIVAN) 1 MG tablet Take 1 tablet (1 mg total) by mouth every 8 (eight) hours as needed for anxiety. 90 tablet 2  . medroxyPROGESTERone (PROVERA) 2.5 MG tablet Take 1 tablet (2.5 mg total) by mouth daily. 90 tablet 3  . meloxicam (MOBIC) 7.5 MG tablet Take 1 tablet (7.5 mg total) by mouth daily. 30 tablet 5  . Multiple Vitamin (MULTIVITAMIN) tablet Take 1 tablet by mouth daily.      Marland Kitchen omeprazole (PRILOSEC) 40 MG capsule TAKE (1) CAPSULE DAILY 30 capsule 0  . polyethylene glycol powder (GLYCOLAX/MIRALAX) powder Take 1 Container by mouth 4 (four) times a week.     . ranitidine (ZANTAC) 150 MG tablet Take 1 tablet (150 mg total) by mouth at bedtime. (Patient taking  differently: Take 150 mg by mouth at bedtime as needed. ) 30 tablet 3   No current facility-administered medications for this visit.     Physical Exam: Vitals:   08/04/17 1313  BP: 124/70  Pulse: 78  SpO2: 98%  Weight: 149 lb (67.6 kg)  Height: 5\' 2"  (1.575 m)    GEN- The patient is well appearing, alert and oriented x 3 today.   Head- normocephalic, atraumatic Eyes-  Sclera clear, conjunctiva pink Ears- hearing intact Oropharynx- clear Lungs- Clear to ausculation bilaterally, normal work of breathing Chest- pacemaker pocket is well healed Heart- Regular rate and rhythm, + click, 2/6 SEM at the apex GI- soft, NT, ND, + BS Extremities- no clubbing, cyanosis, or edema  Pacemaker interrogation- reviewed in detail today,  See PACEART report  ekg tracing ordered today is personally reviewed and shows a sensed, V paced rhythm  Assessment and Plan:  1. Symptomatic  complete heart block Normal pacemaker function See Pace Art report No changes today She is worried about compatibility of hearing aides with her PPM.   I have advised that if she determines which hearing aide she wishes to obtain that I would be happy to check with Tech services with [facility] to confirm compatibility, though I do not think that this would be a problem.  2. HTN Stable No change required today  3. Mitral valve prolapse, MR Echo from 2017 reviewed with patient.  Repeat echo today  Merlin Return to see EP NP in a year  Thompson Grayer MD, [facility] 08/04/2017 1:44 PM

## 2017-08-11 ENCOUNTER — Ambulatory Visit (HOSPITAL_COMMUNITY): Payer: Medicare Other | Attending: Cardiology

## 2017-08-11 ENCOUNTER — Other Ambulatory Visit: Payer: Self-pay

## 2017-08-11 DIAGNOSIS — I341 Nonrheumatic mitral (valve) prolapse: Secondary | ICD-10-CM | POA: Insufficient documentation

## 2017-08-11 DIAGNOSIS — I340 Nonrheumatic mitral (valve) insufficiency: Secondary | ICD-10-CM | POA: Insufficient documentation

## 2017-08-11 DIAGNOSIS — I517 Cardiomegaly: Secondary | ICD-10-CM | POA: Diagnosis not present

## 2017-08-17 ENCOUNTER — Ambulatory Visit: Payer: Medicare Other | Admitting: Physician Assistant

## 2017-08-17 ENCOUNTER — Encounter: Payer: Self-pay | Admitting: Physician Assistant

## 2017-08-17 VITALS — BP 131/70 | HR 82 | Temp 99.3°F | Ht 62.0 in | Wt 149.2 lb

## 2017-08-17 DIAGNOSIS — I429 Cardiomyopathy, unspecified: Secondary | ICD-10-CM

## 2017-08-17 DIAGNOSIS — J40 Bronchitis, not specified as acute or chronic: Secondary | ICD-10-CM | POA: Diagnosis not present

## 2017-08-17 MED ORDER — METHYLPREDNISOLONE ACETATE 80 MG/ML IJ SUSP
80.0000 mg | Freq: Once | INTRAMUSCULAR | Status: AC
  Administered 2017-08-17: 80 mg via INTRAMUSCULAR

## 2017-08-17 MED ORDER — AZITHROMYCIN 250 MG PO TABS: ORAL_TABLET | ORAL | 0 refills | Status: DC

## 2017-08-18 ENCOUNTER — Telehealth: Payer: Self-pay

## 2017-08-18 NOTE — Progress Notes (Signed)
BP 131/70   Pulse 82   Temp 99.3 F (37.4 C) (Oral)   Ht 5\' 2"  (1.575 m)   Wt 149 lb 3.2 oz (67.7 kg)   BMI 27.29 kg/m    Subjective:    Patient ID: Carmen Green, female    DOB: June 26, 1950, 67 y.o.   MRN: [id number]  HPI: Carmen Green is a 67 y.o. female presenting on 08/17/2017 for Cough and Generalized Body Aches Patient with several days of progressing upper respiratory and bronchial symptoms. Initially there was more upper respiratory congestion. This progressed to having significant cough that is productive throughout the day and severe at night. There is occasional wheezing after coughing. Sometimes there is slight dyspnea on exertion. It is productive mucus that is yellow in color. Denies any blood.    Past Medical History:  Diagnosis Date  . Allergy   . Anemia   . Anxiety   . Arthritis   . Cataract    bil cataracts removed  . Colon polyp   . Complete heart block ([place])   . Contusion of chest wall 10/10/2007   Centricity Description: CHEST WALL CONTUSION Qualifier: Diagnosis of  By: Aline Brochure MD, Dorothyann Peng   Centricity Description: CONTUSION, RIGHT RIB Qualifier: Diagnosis of  By: Aline Brochure MD, Dorothyann Peng    . Depression   . Diverticulitis   . Esophagitis   . ESOPHAGITIS 06/25/2008   Qualifier: History of  By: Nelson-Smith CMA (AAMA), Dottie    . GERD (gastroesophageal reflux disease)   . H/O seasonal allergies   . Heart murmur   . Hiatal hernia   . 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 Hypertension   . Mitral valve prolapse   . Pacemaker    Relevant past medical, surgical, family and social history reviewed and updated as indicated. Interim medical history since our last visit reviewed. Allergies and medications reviewed and updated. DATA REVIEWED: CHART IN EPIC  Family History reviewed for pertinent findings.  Review of Systems  Constitutional: Positive for chills and fatigue.  Negative for activity change and appetite change.  HENT: Positive for congestion, postnasal drip, sinus pain and sore throat.   Eyes: Negative.   Respiratory: Positive for cough and wheezing. Negative for shortness of breath.   Cardiovascular: Negative.  Negative for chest pain, palpitations and leg swelling.  Gastrointestinal: Negative.   Genitourinary: Negative.   Musculoskeletal: Negative.   Skin: Negative.   Neurological: Positive for headaches.    Allergies as of 08/17/2017      Reactions   Amoxapine And Related    Rash   Cephalexin Rash   Oxycodone    "Loopy"   Penicillins Rash      Medication List        Accurate as of 08/17/17 11:59 PM. Always use your most recent med list.          azithromycin 250 MG tablet Commonly known as:  ZITHROMAX Z-PAK Take as directed   buPROPion 150 MG 24 hr tablet Commonly known as:  WELLBUTRIN XL Take 2 tablets (300 mg total) by mouth daily.   cetirizine 10 MG tablet Commonly known as:  ZYRTEC Take 10 mg by mouth daily as needed for allergies.   clindamycin 150 MG capsule Commonly known as:  CLEOCIN TAKE 4 CAPSULES 1 HOUR PRIOR TO DENTAL VISIT   estradiol 0.5 MG tablet Commonly known as:  ESTRACE Take 1 tablet (0.5 mg  total) by mouth daily.   fluticasone 50 MCG/ACT nasal spray Commonly known as:  FLONASE Place 1 spray into both nostrils daily as needed for allergies.   guaiFENesin 600 MG 12 hr tablet Commonly known as:  MUCINEX Take 600 mg by mouth 2 (two) times daily as needed for cough.   lisinopril-hydrochlorothiazide 20-12.5 MG tablet Commonly known as:  PRINZIDE,ZESTORETIC Take 1 tablet by mouth every morning.   LORazepam 1 MG tablet Commonly known as:  ATIVAN Take 1 tablet (1 mg total) by mouth every 8 (eight) hours as needed for anxiety.   medroxyPROGESTERone 2.5 MG tablet Commonly known as:  PROVERA Take 1 tablet (2.5 mg total) by mouth daily.   meloxicam 7.5 MG tablet Commonly known as:  MOBIC Take 1  tablet (7.5 mg total) by mouth daily.   multivitamin tablet Take 1 tablet by mouth daily.   omeprazole 40 MG capsule Commonly known as:  PRILOSEC TAKE (1) CAPSULE DAILY   polyethylene glycol powder powder Commonly known as:  GLYCOLAX/MIRALAX Take 1 Container by mouth 4 (four) times a week.   ranitidine 150 MG tablet Commonly known as:  ZANTAC Take 1 tablet (150 mg total) by mouth at bedtime.   Vitamin D3 1000 units Caps Take 1 capsule by mouth daily.          Objective:    BP 131/70   Pulse 82   Temp 99.3 F (37.4 C) (Oral)   Ht 5\' 2"  (1.575 m)   Wt 149 lb 3.2 oz (67.7 kg)   BMI 27.29 kg/m   Allergies  Allergen Reactions  . Amoxapine And Related     Rash  . Cephalexin Rash  . Oxycodone     "Loopy"  . Penicillins Rash    Wt Readings from Last 3 Encounters:  08/17/17 149 lb 3.2 oz (67.7 kg)  08/04/17 149 lb (67.6 kg)  07/06/17 148 lb 6.4 oz (67.3 kg)    Physical Exam  Constitutional: She is oriented to person, place, and time. She appears well-developed and well-nourished.  HENT:  Head: Normocephalic and atraumatic.  Right Ear: There is drainage and tenderness.  Left Ear: There is drainage and tenderness.  Nose: Mucosal edema and rhinorrhea present. Right sinus exhibits no maxillary sinus tenderness and no frontal sinus tenderness. Left sinus exhibits no maxillary sinus tenderness and no frontal sinus tenderness.  Mouth/Throat: Oropharyngeal exudate and posterior oropharyngeal erythema present.  Eyes: Conjunctivae and EOM are normal. Pupils are equal, round, and reactive to light.  Neck: Normal range of motion. Neck supple.  Cardiovascular: Normal rate, regular rhythm, normal heart sounds and intact distal pulses.  Pulmonary/Chest: Effort normal. She has wheezes in the right upper field and the left upper field.  Abdominal: Soft. Bowel sounds are normal.  Neurological: She is alert and oriented to person, place, and time. She has normal reflexes.  Skin:  Skin is warm and dry. No rash noted.  Psychiatric: She has a normal mood and affect. Her behavior is normal. Judgment and thought content normal.  Nursing note and vitals reviewed.       Assessment & Plan:   1. Bronchitis - azithromycin (ZITHROMAX Z-PAK) 250 MG tablet; Take as directed  Dispense: 6 each; Refill: 0 - methylPREDNISolone acetate (DEPO-MEDROL) injection 80 mg  2. Cardiomyopathy, unspecified type [facility]) Follow with cardiology  Continue all other maintenance medications as listed above.  Follow up plan: Return if symptoms worsen or fail to improve.  Educational handout given for survey  Terald Sleeper  PA-C [place]  [place], [place] [address] [phone]   08/18/2017, 11:26 AM

## 2017-08-18 NOTE — Telephone Encounter (Signed)
Error

## 2017-08-31 ENCOUNTER — Other Ambulatory Visit: Payer: Self-pay | Admitting: Physician Assistant

## 2017-08-31 DIAGNOSIS — F411 Generalized anxiety disorder: Secondary | ICD-10-CM

## 2017-09-15 DIAGNOSIS — H903 Sensorineural hearing loss, bilateral: Secondary | ICD-10-CM | POA: Diagnosis not present

## 2017-09-24 ENCOUNTER — Encounter (HOSPITAL_COMMUNITY): Payer: Self-pay | Admitting: Emergency Medicine

## 2017-09-24 ENCOUNTER — Other Ambulatory Visit: Payer: Self-pay

## 2017-09-24 ENCOUNTER — Emergency Department (HOSPITAL_COMMUNITY): Payer: Medicare Other

## 2017-09-24 ENCOUNTER — Emergency Department (HOSPITAL_COMMUNITY)
Admission: EM | Admit: 2017-09-24 | Discharge: 2017-09-24 | Disposition: A | Discharge status: Home / Self Care | Payer: Medicare Other | Attending: Emergency Medicine | Admitting: Emergency Medicine

## 2017-09-24 DIAGNOSIS — S59901A Unspecified injury of right elbow, initial encounter: Secondary | ICD-10-CM | POA: Diagnosis not present

## 2017-09-24 DIAGNOSIS — Y92009 Unspecified place in unspecified non-institutional (private) residence as the place of occurrence of the external cause: Secondary | ICD-10-CM | POA: Insufficient documentation

## 2017-09-24 DIAGNOSIS — I442 Atrioventricular block, complete: Secondary | ICD-10-CM | POA: Diagnosis not present

## 2017-09-24 DIAGNOSIS — Z79899 Other long term (current) drug therapy: Secondary | ICD-10-CM | POA: Diagnosis not present

## 2017-09-24 DIAGNOSIS — Y998 Other external cause status: Secondary | ICD-10-CM | POA: Diagnosis not present

## 2017-09-24 DIAGNOSIS — I10 Essential (primary) hypertension: Secondary | ICD-10-CM | POA: Insufficient documentation

## 2017-09-24 DIAGNOSIS — Y939 Activity, unspecified: Secondary | ICD-10-CM | POA: Diagnosis not present

## 2017-09-24 DIAGNOSIS — S42201A Unspecified fracture of upper end of right humerus, initial encounter for closed fracture: Secondary | ICD-10-CM | POA: Insufficient documentation

## 2017-09-24 DIAGNOSIS — W010XXA Fall on same level from slipping, tripping and stumbling without subsequent striking against object, initial encounter: Secondary | ICD-10-CM | POA: Diagnosis not present

## 2017-09-24 DIAGNOSIS — E785 Hyperlipidemia, unspecified: Secondary | ICD-10-CM | POA: Insufficient documentation

## 2017-09-24 DIAGNOSIS — Z950 Presence of cardiac pacemaker: Secondary | ICD-10-CM | POA: Diagnosis not present

## 2017-09-24 DIAGNOSIS — S42211A Unspecified displaced fracture of surgical neck of right humerus, initial encounter for closed fracture: Secondary | ICD-10-CM | POA: Diagnosis not present

## 2017-09-24 DIAGNOSIS — S4991XA Unspecified injury of right shoulder and upper arm, initial encounter: Secondary | ICD-10-CM | POA: Diagnosis present

## 2017-09-24 MED ORDER — HYDROCODONE-ACETAMINOPHEN 5-325 MG PO TABS
1.0000 | ORAL_TABLET | Freq: Once | ORAL | Status: AC
  Administered 2017-09-24: 1 via ORAL
  Filled 2017-09-24: qty 1

## 2017-09-24 MED ORDER — ACETAMINOPHEN 325 MG PO TABS
650.0000 mg | ORAL_TABLET | Freq: Once | ORAL | Status: AC
Start: 2017-09-24 — End: 2017-09-24
  Administered 2017-09-24: 650 mg via ORAL
  Filled 2017-09-24: qty 2

## 2017-09-24 MED ORDER — HYDROCODONE-ACETAMINOPHEN 5-325 MG PO TABS: 1.0000 | ORAL_TABLET | Freq: Four times a day (QID) | ORAL | 0 refills | Status: DC | PRN

## 2017-09-24 MED ORDER — ACETAMINOPHEN ER 650 MG PO TBCR: 650.0000 mg | EXTENDED_RELEASE_TABLET | Freq: Three times a day (TID) | ORAL | 0 refills | Status: DC

## 2017-09-24 NOTE — ED Provider Notes (Signed)
[facility] EMERGENCY DEPARTMENT Provider Note   CSN: [id number] Arrival date & time: 09/24/17  1529     History   Chief Complaint Chief Complaint  Patient presents with  . Shoulder Pain    HPI Carmen Green is a 67 y.o. female.  HPI  67 year old female comes in with chief complaint of fall.  Patient states that she had a mechanical fall while turning at home, and in the process injured her right arm.  The fall occurred around 2:00, her pain continued to get worse therefore she decided to come to the ER.  Patient's pain is primarily located in the right shoulder and right scapular region.  Patient is also having pain over the elbow.  No new numbness, tingling.  Patient denies any head trauma and she has no neck pain, severe headaches, vision changes, nausea, vomiting, confusion.  Patient does not take any blood thinners.  Patient has been able to ambulate.  Review of system is negative for chest pain and shortness of breath.  Past Medical History:  Diagnosis Date  . Allergy   . Anemia   . Anxiety   . Arthritis   . Cataract    bil cataracts removed  . Colon polyp   . Complete heart block ([place])   . Contusion of chest wall 10/10/2007   Centricity Description: CHEST WALL CONTUSION Qualifier: Diagnosis of  By: Aline Brochure MD, Dorothyann Peng   Centricity Description: CONTUSION, RIGHT RIB Qualifier: Diagnosis of  By: Aline Brochure MD, Dorothyann Peng    . Depression   . Diverticulitis   . Esophagitis   . ESOPHAGITIS 06/25/2008   Qualifier: History of  By: Nelson-Smith CMA (AAMA), Dottie    . GERD (gastroesophageal reflux disease)   . H/O seasonal allergies   . Heart murmur   . Hiatal hernia   . 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 Hypertension   . Mitral valve prolapse   . Pacemaker     Patient Active Problem List   Diagnosis Date Noted  . Allergic rhinitis due to pollen 01/26/2017  . Body mass index 29.0-29.9, adult  03/02/2016  . Palpitation 07/13/2015  . Ovarian abscess due to diverticulitis s/p LSO 11/15/2013 11/16/2013  . 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 CHEST PAIN-UNSPECIFIED 07/03/2010  . CARDIAC MURMUR 05/22/2010  . Anxiety state 11/07/2008  . Complete heart block ([place]) 11/07/2008  . COLONIC POLYPS, HX OF 06/28/2008  . Hyperlipidemia 06/25/2008  . Essential hypertension 06/25/2008  . Mitral valve disorder 06/25/2008  . Cardiomyopathy ([place]) 06/25/2008  . GERD 06/25/2008  . HIATAL HERNIA 06/25/2008    Past Surgical History:  Procedure Laterality Date  . APPENDECTOMY    . BREAST SURGERY Right    biopsy  . COLONOSCOPY  10/19/2004   LEC  . EYE SURGERY Left    cataract extraction with IOL  . LAPAROSCOPIC LOW ANTERIOR RESECTION  11/16/2013   Robotic  . LAPAROSCOPIC LYSIS OF ADHESIONS N/A 11/15/2013   Procedure: LAPAROSCOPIC LYSIS OF ADHESIONS;  Surgeon: Adin Hector, MD;  Location: [facility];  Service: General;  Laterality: N/A;  . left ovary cyst removal  1972  . [place], 2004, 2012   device dependant  . POLYPECTOMY    . PROCTOSCOPY  11/15/2013   Procedure: RIGID PROCTOSCOPY;  Surgeon: Adin Hector, MD;  Location: [facility];  Service: General;;  . ROBOTIC ASSISTED SALPINGO  OOPHERECTOMY Left 11/16/2013   Robotic en bloc w LAR resection  . TUBAL LIGATION    . UPPER GASTROINTESTINAL ENDOSCOPY       OB History   None      Home Medications    Prior to Admission medications   Medication Sig Start Date End Date Taking? Authorizing Provider  azithromycin (ZITHROMAX Z-PAK) 250 MG tablet Take as directed 08/17/17  Yes Terald Sleeper, PA-C  buPROPion (WELLBUTRIN XL) 150 MG 24 hr tablet Take 2 tablets (300 mg total) by mouth daily. 11/04/16  Yes Terald Sleeper, PA-C  cetirizine (ZYRTEC) 10 MG tablet Take 10 mg by mouth daily as needed for allergies.    Yes [provider]  Cholecalciferol (VITAMIN D3) 1000 units CAPS Take 1  capsule by mouth daily.   Yes [provider]  estradiol (ESTRACE) 0.5 MG tablet Take 1 tablet (0.5 mg total) by mouth daily. 07/13/17  Yes Terald Sleeper, PA-C  fluticasone (FLONASE) 50 MCG/ACT nasal spray Place 1 spray into both nostrils daily as needed for allergies. 01/26/17  Yes Terald Sleeper, PA-C  guaiFENesin (MUCINEX) 600 MG 12 hr tablet Take 600 mg by mouth 2 (two) times daily as needed for cough.    Yes [provider]  lisinopril-hydrochlorothiazide (PRINZIDE,ZESTORETIC) 20-12.5 MG tablet Take 1 tablet by mouth every morning. 07/13/17  Yes Terald Sleeper, PA-C  LORazepam (ATIVAN) 1 MG tablet TAKE 1 TABLET EVERY 8 HOURS AS NEEDED FOR ANXIETY 08/31/17  Yes Terald Sleeper, PA-C  medroxyPROGESTERone (PROVERA) 2.5 MG tablet Take 1 tablet (2.5 mg total) by mouth daily. 07/13/17  Yes Terald Sleeper, PA-C  meloxicam (MOBIC) 7.5 MG tablet Take 1 tablet (7.5 mg total) by mouth daily. Patient taking differently: Take 7.5 mg by mouth as needed.  07/06/17  Yes Terald Sleeper, PA-C  Multiple Vitamin (MULTIVITAMIN) tablet Take 1 tablet by mouth daily.     Yes [provider]  omeprazole (PRILOSEC) 40 MG capsule TAKE (1) CAPSULE DAILY 08/01/17  Yes Nandigam, Kavitha V, MD  polyethylene glycol powder (GLYCOLAX/MIRALAX) powder Take 1 Container by mouth as needed for mild constipation.    Yes [provider]  ranitidine (ZANTAC) 150 MG tablet Take 1 tablet (150 mg total) by mouth at bedtime. Patient taking differently: Take 150 mg by mouth as needed for heartburn.  07/15/16  Yes Nandigam, Venia Minks, MD  acetaminophen (TYLENOL 8 HOUR) 650 MG CR tablet Take 1 tablet (650 mg total) by mouth every 8 (eight) hours. 09/24/17   Varney Biles, MD  clindamycin (CLEOCIN) 150 MG capsule TAKE 4 CAPSULES 1 HOUR PRIOR TO DENTAL VISIT Patient not taking: Reported on 09/24/2017 09/08/16   Terald Sleeper, PA-C  HYDROcodone-acetaminophen (NORCO/VICODIN) 5-325 MG tablet Take 1 tablet by mouth every 6  (six) hours as needed. 09/24/17   Varney Biles, MD    Family History Family History  Problem Relation Age of Onset  . Diabetes Mother   . Stroke Mother   . Diabetes Father   . Lung cancer Father   . Lung cancer Maternal Grandfather   . Heart disease Maternal Aunt   . Heart disease Maternal Grandmother   . Breast cancer Maternal Aunt   . Colon cancer Neg Hx   . Esophageal cancer Neg Hx   . Pancreatic cancer Neg Hx   . Rectal cancer Neg Hx   . Stomach cancer Neg Hx     Social History Social History   Tobacco Use  .  Smoking status: Never Smoker  . Smokeless tobacco: Never Used  Substance Use Topics  . Alcohol use: No  . Drug use: No     Allergies   Amoxapine and related; Cephalexin; Oxycodone; and Penicillins   Review of Systems Review of Systems  Constitutional: Positive for activity change.  Eyes: Negative for visual disturbance.  Respiratory: Negative for shortness of breath.   Cardiovascular: Negative for chest pain.  Musculoskeletal: Positive for arthralgias. Negative for neck pain.  Skin: Negative for wound.  Neurological: Negative for headaches.     Physical Exam Updated Vital Signs BP 136/85   Pulse (!) 46   Temp 98.4 F (36.9 C) (Oral)   Resp 18   Ht 5\' 1"  (1.549 m)   Wt 68 kg (150 lb)   SpO2 100%   BMI 28.34 kg/m   Physical Exam  Constitutional: She is oriented to person, place, and time. She appears well-developed.  HENT:  Head: Normocephalic and atraumatic.  Eyes: EOM are normal.  Neck: Normal range of motion. Neck supple.  Cardiovascular: Normal rate and intact distal pulses.  Pulmonary/Chest: Effort normal.  Abdominal: Bowel sounds are normal.  Musculoskeletal: She exhibits tenderness.  Patient has tenderness over the right anterior shoulder and right lateral elbow.   Neurological: She is alert and oriented to person, place, and time.  Skin: Skin is warm and dry.  Nursing note and vitals reviewed.    ED Treatments / Results    Labs (all labs ordered are listed, but only abnormal results are displayed) Labs Reviewed - No data to display  EKG None  Radiology Dg Shoulder Right  Result Date: 09/24/2017 CLINICAL DATA:  Pain after fall EXAM: RIGHT SHOULDER - 2+ VIEW COMPARISON:  None. FINDINGS: There is a fracture through the proximal humerus, through the neck. The fracture is best seen on the single internally rotated AP view. It is unclear whether the humeral head is involved. IMPRESSION: Proximal humeral fracture as above. Electronically Signed   By: Dorise Bullion III M.D   On: 09/24/2017 16:32   Dg Elbow Complete Right  Result Date: 09/24/2017 CLINICAL DATA:  67 year old who fell in her basement earlier today and injured the RIGHT UPPER extremity. Initial encounter. EXAM: RIGHT ELBOW - COMPLETE 3+ VIEW COMPARISON:  None. FINDINGS: No evidence of acute fracture or dislocation. Well-preserved joint spaces. Small spur arising from the coronoid process of the ulna. Well-preserved bone mineral density. Ununited apophysis for the medial epicondyle. No POSTERIOR fat pad to indicate a joint effusion or hemarthrosis. IMPRESSION: No acute osseous abnormality.  Minimal degenerative changes. Electronically Signed   By: Evangeline Dakin M.D.   On: 09/24/2017 16:59    Procedures Procedures (including critical care time)  Medications Ordered in ED Medications  HYDROcodone-acetaminophen (NORCO/VICODIN) 5-325 MG per tablet 1 tablet (has no administration in time range)  acetaminophen (TYLENOL) tablet 650 mg (650 mg Oral Given 09/24/17 1623)     Initial Impression / Assessment and Plan / ED Course  I have reviewed the triage vital signs and the nursing notes.  Pertinent labs & imaging results that were available during my care of the patient were reviewed by me and considered in my medical decision making (see chart for details).     67 year old female comes in after a [place] type injury.  In the process she has right  proximal humeral fracture. Patient is neurovascularly intact.  Elbow x-ray does not show any new fractures.  I feel comfortable clearing the brain and C-spine clinically, given  the fall occurred about 3 hours prior to ED arrival, and patient has no red flags concerning for elevated ICP.  Strict ER return precautions have been discussed, and patient is agreeing with the plan and is comfortable with the workup done and the recommendations from the ER.   Final Clinical Impressions(s) / ED Diagnoses   Final diagnoses:  Closed fracture of proximal end of right humerus, unspecified fracture morphology, initial encounter    ED Discharge Orders        Ordered    HYDROcodone-acetaminophen (NORCO/VICODIN) 5-325 MG tablet  Every 6 hours PRN     09/24/17 1720    acetaminophen (TYLENOL 8 HOUR) 650 MG CR tablet  Every 8 hours     09/24/17 [place], Lanita Stammen, MD 09/24/17 1730

## 2017-09-24 NOTE — ED Triage Notes (Signed)
Pt reports her feet got caught on something and she fell onto her R shoulder, denies LOC or blood thinner use. Has taken two Aleve.

## 2017-09-24 NOTE — Discharge Instructions (Signed)
The x-ray shows that you have a fracture of the right shoulder. Please follow-up with the orthopedist as requested. Keep the arm immobilized, and ice at least 4-6 times a day for 10 minutes.

## 2017-09-28 ENCOUNTER — Ambulatory Visit: Payer: Medicare Other | Admitting: Orthopedic Surgery

## 2017-09-28 ENCOUNTER — Encounter: Payer: Self-pay | Admitting: Orthopedic Surgery

## 2017-09-28 VITALS — BP 149/75 | HR 86 | Ht 62.0 in | Wt 146.0 lb

## 2017-09-28 DIAGNOSIS — S42294A Other nondisplaced fracture of upper end of right humerus, initial encounter for closed fracture: Secondary | ICD-10-CM

## 2017-09-28 NOTE — Progress Notes (Signed)
NEW PATIENT OFFICE VISIT   Chief Complaint  Patient presents with  . New Patient (Initial Visit)    Proximal Humerus Fracture Right DOI 09/24/17    67 presents for evaluation of right proximal humerus fracture  She complains of mild to moderate dull pain right shoulder times 4 days associated with decreased range of motion and bruising of the upper arm injury secondary to a fall   Review of Systems  Constitutional: Negative for chills, fever and weight loss.  Respiratory: Negative for shortness of breath.   Cardiovascular: Negative for chest pain.  Neurological: Negative for tingling.     Past Medical History:  Diagnosis Date  . Allergy   . Anemia   . Anxiety   . Arthritis   . Cataract    bil cataracts removed  . Colon polyp   . Complete heart block ([place])   . Contusion of chest wall 10/10/2007   Centricity Description: CHEST WALL CONTUSION Qualifier: Diagnosis of  By: Aline Brochure MD, Dorothyann Peng   Centricity Description: CONTUSION, RIGHT RIB Qualifier: Diagnosis of  By: Aline Brochure MD, Dorothyann Peng    . Depression   . Diverticulitis   . Esophagitis   . ESOPHAGITIS 06/25/2008   Qualifier: History of  By: Nelson-Smith CMA (AAMA), Dottie    . GERD (gastroesophageal reflux disease)   . H/O seasonal allergies   . Heart murmur   . Hiatal hernia   . 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 Hypertension   . Mitral valve prolapse   . Pacemaker     Past Surgical History:  Procedure Laterality Date  . APPENDECTOMY    . BREAST SURGERY Right    biopsy  . COLONOSCOPY  10/19/2004   LEC  . EYE SURGERY Left    cataract extraction with IOL  . LAPAROSCOPIC LOW ANTERIOR RESECTION  11/16/2013   Robotic  . LAPAROSCOPIC LYSIS OF ADHESIONS N/A 11/15/2013   Procedure: LAPAROSCOPIC LYSIS OF ADHESIONS;  Surgeon: Adin Hector, MD;  Location: [facility];  Service: General;  Laterality: N/A;  . left ovary cyst removal  1972  .  [place], 2004, 2012   device dependant  . POLYPECTOMY    . PROCTOSCOPY  11/15/2013   Procedure: RIGID PROCTOSCOPY;  Surgeon: Adin Hector, MD;  Location: [facility];  Service: General;;  . ROBOTIC ASSISTED SALPINGO OOPHERECTOMY Left 11/16/2013   Robotic en bloc w LAR resection  . TUBAL LIGATION    . UPPER GASTROINTESTINAL ENDOSCOPY      Family History  Problem Relation Age of Onset  . Diabetes Mother   . Stroke Mother   . Diabetes Father   . Lung cancer Father   . Lung cancer Maternal Grandfather   . Heart disease Maternal Aunt   . Heart disease Maternal Grandmother   . Breast cancer Maternal Aunt   . Colon cancer Neg Hx   . Esophageal cancer Neg Hx   . Pancreatic cancer Neg Hx   . Rectal cancer Neg Hx   . Stomach cancer Neg Hx    Social History   Tobacco Use  . Smoking status: Never Smoker  . Smokeless tobacco: Never Used  Substance Use Topics  . Alcohol use: No  . Drug use: No    @ALL @  Current Meds  Medication Sig  . acetaminophen (TYLENOL 8 HOUR) 650 MG CR tablet Take 1 tablet (650 mg total) by mouth every 8 (  eight) hours.  Marland Kitchen buPROPion (WELLBUTRIN XL) 150 MG 24 hr tablet Take 2 tablets (300 mg total) by mouth daily.  . cetirizine (ZYRTEC) 10 MG tablet Take 10 mg by mouth daily as needed for allergies.   . Cholecalciferol (VITAMIN D3) 1000 units CAPS Take 1 capsule by mouth daily.  Marland Kitchen estradiol (ESTRACE) 0.5 MG tablet Take 1 tablet (0.5 mg total) by mouth daily.  . fluticasone (FLONASE) 50 MCG/ACT nasal spray Place 1 spray into both nostrils daily as needed for allergies.  Marland Kitchen guaiFENesin (MUCINEX) 600 MG 12 hr tablet Take 600 mg by mouth 2 (two) times daily as needed for cough.   Marland Kitchen lisinopril-hydrochlorothiazide (PRINZIDE,ZESTORETIC) 20-12.5 MG tablet Take 1 tablet by mouth every morning.  Marland Kitchen LORazepam (ATIVAN) 1 MG tablet TAKE 1 TABLET EVERY 8 HOURS AS NEEDED FOR ANXIETY  . medroxyPROGESTERone (PROVERA) 2.5 MG tablet Take 1 tablet (2.5 mg total) by  mouth daily.  . meloxicam (MOBIC) 7.5 MG tablet Take 1 tablet (7.5 mg total) by mouth daily.  . Multiple Vitamin (MULTIVITAMIN) tablet Take 1 tablet by mouth daily.    Marland Kitchen omeprazole (PRILOSEC) 40 MG capsule TAKE (1) CAPSULE DAILY  . polyethylene glycol powder (GLYCOLAX/MIRALAX) powder Take 1 Container by mouth as needed for mild constipation.   . [DISCONTINUED] HYDROcodone-acetaminophen (NORCO/VICODIN) 5-325 MG tablet Take 1 tablet by mouth every 6 (six) hours as needed.    BP (!) 149/75   Pulse 86   Ht 5\' 2"  (1.575 m)   Wt 146 lb (66.2 kg)   BMI 26.70 kg/m   Physical Exam  Constitutional: She is oriented to person, place, and time. She appears well-developed and well-nourished.  Neurological: She is alert and oriented to person, place, and time.  Psychiatric: She has a normal mood and affect. Judgment normal.  Vitals reviewed.   Ortho Exam  Right shoulder is tender.  Proximal humerus tenderness skin ecchymotic muscle tone normal shoulder stability cannot be assessed elbow was reduced stable wrist reduced stable range of motion decreased elbow wrist normal in the hand pulse and perfusion normal right upper extremity axillary epitrochlear lymph nodes normal sensation normal right arm  MEDICAL DECISION SECTION  xrays ordered?  No hospital x-rays read independently see below: AP lateral  My independent reading of xrays: Proximal humerus fracture nondisplaced   Encounter Diagnosis  Name Primary?  . Other closed nondisplaced fracture of proximal end of right humerus, initial encounter Yes     PLAN:   Shoulder sling x-ray in a few weeks

## 2017-09-28 NOTE — Progress Notes (Signed)
new

## 2017-09-30 ENCOUNTER — Other Ambulatory Visit: Payer: Self-pay | Admitting: Orthopedic Surgery

## 2017-09-30 NOTE — Telephone Encounter (Signed)
Patient called, states, as discussed with Dr Aline Brochure at time of visit 09/28/17, advised to call if need to request medication.   - [place]    HYDROcodone-acetaminophen (NORCO/VICODIN) 5-325 MG tablet

## 2017-10-03 ENCOUNTER — Ambulatory Visit (INDEPENDENT_AMBULATORY_CARE_PROVIDER_SITE_OTHER): Payer: Medicare Other | Admitting: Otolaryngology

## 2017-10-03 DIAGNOSIS — J351 Hypertrophy of tonsils: Secondary | ICD-10-CM

## 2017-10-03 MED ORDER — HYDROCODONE-ACETAMINOPHEN 5-325 MG PO TABS: 1.0000 | ORAL_TABLET | Freq: Four times a day (QID) | ORAL | 0 refills | Status: DC | PRN

## 2017-10-13 ENCOUNTER — Other Ambulatory Visit: Payer: Self-pay | Admitting: Physician Assistant

## 2017-10-13 MED ORDER — AZITHROMYCIN 250 MG PO TABS: ORAL_TABLET | ORAL | 0 refills | Status: DC

## 2017-10-13 NOTE — Progress Notes (Unsigned)
z-pak 

## 2017-10-17 ENCOUNTER — Other Ambulatory Visit: Payer: Self-pay | Admitting: Physician Assistant

## 2017-10-17 ENCOUNTER — Ambulatory Visit (INDEPENDENT_AMBULATORY_CARE_PROVIDER_SITE_OTHER): Payer: Medicare Other | Admitting: *Deleted

## 2017-10-17 DIAGNOSIS — I442 Atrioventricular block, complete: Secondary | ICD-10-CM

## 2017-10-17 DIAGNOSIS — S42201A Unspecified fracture of upper end of right humerus, initial encounter for closed fracture: Secondary | ICD-10-CM | POA: Insufficient documentation

## 2017-10-17 DIAGNOSIS — H43812 Vitreous degeneration, left eye: Secondary | ICD-10-CM | POA: Diagnosis not present

## 2017-10-17 DIAGNOSIS — H35373 Puckering of macula, bilateral: Secondary | ICD-10-CM | POA: Diagnosis not present

## 2017-10-17 DIAGNOSIS — H10413 Chronic giant papillary conjunctivitis, bilateral: Secondary | ICD-10-CM | POA: Diagnosis not present

## 2017-10-17 DIAGNOSIS — H26491 Other secondary cataract, right eye: Secondary | ICD-10-CM | POA: Diagnosis not present

## 2017-10-17 NOTE — Progress Notes (Signed)
Remote pacemaker transmission.   

## 2017-10-18 ENCOUNTER — Encounter: Payer: Self-pay | Admitting: Cardiology

## 2017-10-19 ENCOUNTER — Ambulatory Visit (INDEPENDENT_AMBULATORY_CARE_PROVIDER_SITE_OTHER): Payer: Medicare Other

## 2017-10-19 ENCOUNTER — Encounter: Payer: Self-pay | Admitting: Orthopedic Surgery

## 2017-10-19 ENCOUNTER — Ambulatory Visit (INDEPENDENT_AMBULATORY_CARE_PROVIDER_SITE_OTHER): Payer: Self-pay | Admitting: Orthopedic Surgery

## 2017-10-19 DIAGNOSIS — S42294D Other nondisplaced fracture of upper end of right humerus, subsequent encounter for fracture with routine healing: Secondary | ICD-10-CM

## 2017-10-19 NOTE — Addendum Note (Signed)
Addended byCandice Camp on: 10/19/2017 05:02 PM   Modules accepted: Orders

## 2017-10-19 NOTE — Patient Instructions (Signed)
Start physical therapy use sling as needed no heavy lifting

## 2017-10-19 NOTE — Progress Notes (Signed)
Chief Complaint  Patient presents with  . Shoulder Injury    right proximal humerus fracture 09/24/17    3 weeks after proximal humerus fracture (5 days)  Pain has improved in terms of intensity  Neurovascular exam is normal  X-ray shows stable proximal humerus fracture  Start physical therapy return in 6 weeks  Encounter Diagnosis  Name Primary?  . Other closed nondisplaced fracture of proximal end of right humerus with routine healing, subsequent encounter

## 2017-10-25 ENCOUNTER — Encounter: Payer: Self-pay | Admitting: Physical Therapy

## 2017-10-25 ENCOUNTER — Ambulatory Visit: Payer: Medicare Other | Attending: Orthopedic Surgery | Admitting: Physical Therapy

## 2017-10-25 ENCOUNTER — Other Ambulatory Visit: Payer: Self-pay

## 2017-10-25 DIAGNOSIS — M25511 Pain in right shoulder: Secondary | ICD-10-CM | POA: Insufficient documentation

## 2017-10-25 DIAGNOSIS — M6281 Muscle weakness (generalized): Secondary | ICD-10-CM | POA: Insufficient documentation

## 2017-10-25 DIAGNOSIS — M25611 Stiffness of right shoulder, not elsewhere classified: Secondary | ICD-10-CM

## 2017-10-25 NOTE — Therapy (Signed)
[facility] [place], [place], [address] Phone: [phone]   Fax:  [phone]  Physical Therapy Evaluation  Patient Details  Name: Carmen Green MRN: [id number] Date of Birth: 23-May-1951 Referring Provider: Carole Civil.   Encounter Date: 10/25/2017  PT End of Session - 10/25/17 0906    Visit Number  1    Number of Visits  18    Date for PT Re-Evaluation  12/13/17    Authorization Type  FOTO every 5th visit, progress note every 10th visit    PT Start Time  0815    PT Stop Time  0900    PT Time Calculation (min)  45 min    Activity Tolerance  Patient tolerated treatment well    Behavior During Therapy  [facility] for tasks assessed/performed       Past Medical History:  Diagnosis Date  . Allergy   . Anemia   . Anxiety   . Arthritis   . Cataract    bil cataracts removed  . Colon polyp   . Complete heart block ([place])   . Contusion of chest wall 10/10/2007   Centricity Description: CHEST WALL CONTUSION Qualifier: Diagnosis of  By: Aline Brochure MD, Dorothyann Peng   Centricity Description: CONTUSION, RIGHT RIB Qualifier: Diagnosis of  By: Aline Brochure MD, Dorothyann Peng    . Depression   . Diverticulitis   . Esophagitis   . ESOPHAGITIS 06/25/2008   Qualifier: History of  By: Nelson-Smith CMA (AAMA), Dottie    . GERD (gastroesophageal reflux disease)   . H/O seasonal allergies   . Heart murmur   . Hiatal hernia   . 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 Hypertension   . Mitral valve prolapse   . Pacemaker     Past Surgical History:  Procedure Laterality Date  . APPENDECTOMY    . BREAST SURGERY Right    biopsy  . COLONOSCOPY  10/19/2004   LEC  . EYE SURGERY Left    cataract extraction with IOL  . LAPAROSCOPIC LOW ANTERIOR RESECTION  11/16/2013   Robotic  . LAPAROSCOPIC LYSIS OF ADHESIONS N/A 11/15/2013   Procedure: LAPAROSCOPIC LYSIS OF ADHESIONS;  Surgeon:  Adin Hector, MD;  Location: [facility];  Service: General;  Laterality: N/A;  . left ovary cyst removal  1972  . [place], 2004, 2012   device dependant  . POLYPECTOMY    . PROCTOSCOPY  11/15/2013   Procedure: RIGID PROCTOSCOPY;  Surgeon: Adin Hector, MD;  Location: [facility];  Service: General;;  . ROBOTIC ASSISTED SALPINGO OOPHERECTOMY Left 11/16/2013   Robotic en bloc w LAR resection  . TUBAL LIGATION    . UPPER GASTROINTESTINAL ENDOSCOPY      There were no vitals filed for this visit.   Subjective Assessment - 10/25/17 1203    Subjective  Patient arrives to PT with husband after falling on her right shoulder and fracturing her proximal humerus on 09/24/17. Patient was in a sling until last Wednesday (10/19/17), now patient only sleeps with sling. Patient reports pain is felt from the elbow up to shoulder and varies in intensity. At best pain is a 0/10 with rest, pain medication, and ice; at worst 8/10 with movement. Patient has difficulties with ADLs such as washing and dressing and requires assistance from husband and/or daughter. Patient has not returned to driving as she does not feel ready because  of lack of ability to move arm to drive. Patient's goals are to decrease pain, improve ability to perform ADLs independently, and return to PLOF.     Patient is accompained by:  Family member Husband    Pertinent History  right proximal humerus fx 09/24/17, PACEMAKER, HTN    Limitations  Lifting;House hold activities    Diagnostic tests  X-Ray: normal healing of fx.    Patient Stated Goals  get back to normal    Currently in Pain?  Yes    Pain Score  8     Pain Location  Shoulder    Pain Orientation  Right    Pain Descriptors / Indicators  Sore    Pain Type  Chronic pain    Pain Onset  More than a month ago    Pain Frequency  Intermittent    Aggravating Factors   movement    Pain Relieving Factors  rest, medication, ice    Effect of Pain on Daily Activities  ADLs, washing,  dressing         [facility] PT Assessment - 10/25/17 0001      Assessment   Medical Diagnosis  Right closed non-displaced fracture of the proximal end of the humerus    Referring Provider  Carole Civil.    Onset Date/Surgical Date  09/24/17    Hand Dominance  Right    Next MD Visit  10/30/17    Prior Therapy  no      Balance Screen   Has the patient fallen in the past 6 months  Yes    How many times?  1    Has the patient had a decrease in activity level because of a fear of falling?   No    Is the patient reluctant to leave their home because of a fear of falling?   No      Home Film/video editor residence    Living Arrangements  Spouse/significant other      Prior Function   Vocation  Retired      Observation/Other Assessments   Focus on Therapeutic Outcomes (FOTO)   59 % limited      Sensation   Light Touch  Appears Intact      ROM / Strength   AROM / PROM / Strength  AROM;Strength;PROM      AROM   Overall AROM   Due to pain    AROM Assessment Site  Shoulder    Right/Left Shoulder  Right    Right Shoulder Extension  24 Degrees    Right Shoulder Flexion  70 Degrees    Right Shoulder ABduction  58 Degrees    Right Shoulder External Rotation  24 Degrees at 45 degrees of abd      PROM   Overall PROM   Due to pain    PROM Assessment Site  Shoulder    Right/Left Shoulder  Right    Right Shoulder Flexion  107 Degrees    Right Shoulder ABduction  70 Degrees    Right Shoulder External Rotation  29 Degrees at 45 degrees abd      Strength   Overall Strength Comments  unable to perform full AROM    Strength Assessment Site  Shoulder    Right/Left Shoulder  Right    Right Shoulder Flexion  2+/5    Right Shoulder ABduction  2+/5    Right Shoulder Internal Rotation  3/5    Right Shoulder  External Rotation  2+/5      Palpation   Palpation comment  tenderness to palpation to lateral aspect of right triceps and R UT and insertion point of  levator scapula      Bed Mobility   Bed Mobility  -- Independent      Transfers   Transfers  Independent with all Transfers                Objective measurements completed on examination: See above findings.              PT Education - 10/25/17 1001    Education provided  Yes    Education Details  wall slides, AAROM ER, scapular retractions, counter top flexion, AAROM scaption    Person(s) Educated  Patient;Spouse    Methods  Explanation;Handout;Demonstration    Comprehension  Verbalized understanding;Returned demonstration       PT Short Term Goals - 10/25/17 1213      PT SHORT TERM GOAL #1   Title  Patient will be independent with HEP    Time  3    Period  Weeks    Status  New      PT SHORT TERM GOAL #2   Title  Patient will improve right shoulder AROM flexion to 90 + degrees with less than 2/10 pain.    Time  3    Period  Weeks    Status  New      PT SHORT TERM GOAL #3   Title  Patient will decrease at worst pain to 5/10 or less in order to perform ADLS and functional activities.    Time  3    Period  Weeks    Status  New        PT Long Term Goals - 10/25/17 1217      PT LONG TERM GOAL #1   Title  Patient will improve right shoulder AROM flexion and abduction to 140 + degrees in order to perform functional activities and ADLs.     Time  6    Period  Weeks    Status  New      PT LONG TERM GOAL #2   Title  Patient will improve right shoulder ER to 60+ degrees in order to wash and style hair and don/doff apparel.     Time  6    Period  Weeks      PT LONG TERM GOAL #3   Title  Patient will improve R shoulder MMT in all planes to 4/5 or greater to improve ability to perform functional activities.    Time  6    Period  Weeks    Status  New      PT LONG TERM GOAL #4   Title  Patient will report ability to drive with no reports of pain with shoulder motion.    Time  6    Period  Weeks    Status  New      PT LONG TERM GOAL #5   Title   Patient will improve right grip strength to equal left.     Time  6    Period  Weeks    Status  New             Plan - 10/25/17 1206    Clinical Impression Statement  Patient is a 67 year old female who presents to physical therapy with right shoulder pain, decreased AROM, PROM, and right shoulder strength. Patient denies numbness and  tingling and states sensation is normal. Patient noted with trunk extension compensation and shoulder elevation compensation during AROM. Patient also noted with trunk extension compensation during PROM. Patient noted with decreased right grip strength in comparison to L. Patient noted with tenderness to palpation in the lateral aspect of the triceps muscle and insertion point of levator scapula. Patient also noted with increased muscle tension in R UT. Patient's FOTO limitation: 57%. Patient would benefit from skilled physical therapy to address deficits and return to PLOF.     History and Personal Factors relevant to plan of care:  PACEMAKER, HTN    Clinical Presentation  Stable    Clinical Decision Making  Low    Rehab Potential  Good    PT Frequency  3x / week 2-3X PER WEEK    PT Duration  6 weeks    PT Treatment/Interventions  ADLs/Self Care Home Management;Cryotherapy;Moist Heat;Vasopneumatic Device;Taping;Dry needling;Passive range of motion;Manual techniques;Patient/family education;Therapeutic exercise;Therapeutic activities;Ultrasound;Iontophoresis 4mg /ml Dexamethasone    PT Next Visit Plan  Pulleys, PROM, and gentle AAROM to improve ROM, NO E-STIM.    PT Home Exercise Plan  scapular retractions, wall slides, countertop shoulder flexion, AAROM scaption, AAROM ER    Consulted and Agree with Plan of Care  Patient       Patient will benefit from skilled therapeutic intervention in order to improve the following deficits and impairments:  Pain, Decreased activity tolerance, Decreased range of motion, Decreased strength, Impaired UE functional  use  Visit Diagnosis: Right shoulder pain, unspecified chronicity  Stiffness of right shoulder, not elsewhere classified  Muscle weakness (generalized)     Problem List Patient Active Problem List   Diagnosis Date Noted  . Closed fracture of proximal end of right humerus 09/24/17 10/17/2017  . Allergic rhinitis due to pollen 01/26/2017  . Body mass index 29.0-29.9, adult 03/02/2016  . Palpitation 07/13/2015  . Ovarian abscess due to diverticulitis s/p LSO 11/15/2013 11/16/2013  . 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 CHEST PAIN-UNSPECIFIED 07/03/2010  . CARDIAC MURMUR 05/22/2010  . Anxiety state 11/07/2008  . Complete heart block ([place]) 11/07/2008  . COLONIC POLYPS, HX OF 06/28/2008  . Hyperlipidemia 06/25/2008  . Essential hypertension 06/25/2008  . Mitral valve disorder 06/25/2008  . Cardiomyopathy ([place]) 06/25/2008  . GERD 06/25/2008  . HIATAL HERNIA 06/25/2008   Gabriela Eves, PT, DPT 10/25/2017, 12:57 PM  [facility] [place], [place], [address] Phone: [phone]   Fax:  [phone]  Name: Carmen Green MRN: [id number] Date of Birth: 04/11/1951

## 2017-10-25 NOTE — Patient Instructions (Signed)
   Fintan Grater, PT, DPT [facility] [address] [place], [place], [address] Phone: [phone]   Fax:  [phone]  

## 2017-10-26 ENCOUNTER — Other Ambulatory Visit: Payer: Self-pay | Admitting: Orthopedic Surgery

## 2017-10-26 MED ORDER — HYDROCODONE-ACETAMINOPHEN 5-325 MG PO TABS: 1.0000 | ORAL_TABLET | Freq: Four times a day (QID) | ORAL | 0 refills | Status: DC | PRN

## 2017-10-26 NOTE — Telephone Encounter (Signed)
Patient called, requests refill / states began therapy yesterday   HYDROcodone-acetaminophen (NORCO/VICODIN) 5-325 MG tablet 30 tablet   [employer]

## 2017-10-27 ENCOUNTER — Encounter: Payer: Self-pay | Admitting: Physical Therapy

## 2017-10-27 ENCOUNTER — Ambulatory Visit: Payer: Medicare Other | Admitting: Physical Therapy

## 2017-10-27 DIAGNOSIS — M6281 Muscle weakness (generalized): Secondary | ICD-10-CM

## 2017-10-27 DIAGNOSIS — M25611 Stiffness of right shoulder, not elsewhere classified: Secondary | ICD-10-CM | POA: Diagnosis not present

## 2017-10-27 DIAGNOSIS — M25511 Pain in right shoulder: Secondary | ICD-10-CM | POA: Diagnosis not present

## 2017-10-27 NOTE — Therapy (Signed)
[facility] [place], [place], [address] Phone: [phone]   Fax:  [phone]  Physical Therapy Treatment  Patient Details  Name: Carmen Green MRN: [id number] Date of Birth: August 09, 1950 Referring Provider: Carole Civil.   Encounter Date: 10/27/2017  PT End of Session - 10/27/17 1614    Visit Number  2    Number of Visits  18    Date for PT Re-Evaluation  12/13/17    Authorization Type  FOTO every 5th visit, progress note every 10th visit; KX modifier after 15th visit.    PT Start Time  0315    PT Stop Time  0405    PT Time Calculation (min)  50 min    Activity Tolerance  Patient tolerated treatment well    Behavior During Therapy  WFL for tasks assessed/performed       Past Medical History:  Diagnosis Date  . Allergy   . Anemia   . Anxiety   . Arthritis   . Cataract    bil cataracts removed  . Colon polyp   . Complete heart block ([place])   . Contusion of chest wall 10/10/2007   Centricity Description: CHEST WALL CONTUSION Qualifier: Diagnosis of  By: Aline Brochure MD, Dorothyann Peng   Centricity Description: CONTUSION, RIGHT RIB Qualifier: Diagnosis of  By: Aline Brochure MD, Dorothyann Peng    . Depression   . Diverticulitis   . Esophagitis   . ESOPHAGITIS 06/25/2008   Qualifier: History of  By: Nelson-Smith CMA (AAMA), Dottie    . GERD (gastroesophageal reflux disease)   . H/O seasonal allergies   . Heart murmur   . Hiatal hernia   . 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 Hypertension   . Mitral valve prolapse   . Pacemaker     Past Surgical History:  Procedure Laterality Date  . APPENDECTOMY    . BREAST SURGERY Right    biopsy  . COLONOSCOPY  10/19/2004   LEC  . EYE SURGERY Left    cataract extraction with IOL  . LAPAROSCOPIC LOW ANTERIOR RESECTION  11/16/2013   Robotic  . LAPAROSCOPIC LYSIS OF ADHESIONS N/A 11/15/2013   Procedure: LAPAROSCOPIC  LYSIS OF ADHESIONS;  Surgeon: Adin Hector, MD;  Location: [facility];  Service: General;  Laterality: N/A;  . left ovary cyst removal  1972  . [place], 2004, 2012   device dependant  . POLYPECTOMY    . PROCTOSCOPY  11/15/2013   Procedure: RIGID PROCTOSCOPY;  Surgeon: Adin Hector, MD;  Location: [facility];  Service: General;;  . ROBOTIC ASSISTED SALPINGO OOPHERECTOMY Left 11/16/2013   Robotic en bloc w LAR resection  . TUBAL LIGATION    . UPPER GASTROINTESTINAL ENDOSCOPY      There were no vitals filed for this visit.  Subjective Assessment - 10/27/17 1603    Subjective  I'm doing those home exercises.  It's hard for me to relax.    Pain Score  7     Pain Location  Shoulder    Pain Orientation  Right    Pain Onset  More than a month ago                       [facility] Adult PT Treatment/Exercise - 10/27/17 0001      Modalities   Modalities  Vasopneumatic      Vasopneumatic  Number Minutes Vasopneumatic   15 minutes    Vasopnuematic Location   -- RT SHLD with folded pillow between thorax and elbow.    Vasopneumatic Pressure  Low      Manual Therapy   Manual Therapy  Passive ROM    Passive ROM  In supine:  PAROM x 23 minutes to patient's right shoulder into flexion and ER.             PT Education - 10/27/17 1611    Education Details  Instruct in supine passive ER with cane.    Person(s) Educated  Patient;Spouse    Methods  Explanation;Demonstration    Comprehension  Verbalized understanding;Need further instruction       PT Short Term Goals - 10/25/17 1213      PT SHORT TERM GOAL #1   Title  Patient will be independent with HEP    Time  3    Period  Weeks    Status  New      PT SHORT TERM GOAL #2   Title  Patient will improve right shoulder AROM flexion to 90 + degrees with less than 2/10 pain.    Time  3    Period  Weeks    Status  New      PT SHORT TERM GOAL #3   Title  Patient will decrease at worst pain to 5/10 or  less in order to perform ADLS and functional activities.    Time  3    Period  Weeks    Status  New        PT Long Term Goals - 10/25/17 1217      PT LONG TERM GOAL #1   Title  Patient will improve right shoulder AROM flexion and abduction to 140 + degrees in order to perform functional activities and ADLs.     Time  6    Period  Weeks    Status  New      PT LONG TERM GOAL #2   Title  Patient will improve right shoulder ER to 60+ degrees in order to wash and style hair and don/doff apparel.     Time  6    Period  Weeks      PT LONG TERM GOAL #3   Title  Patient will improve R shoulder MMT in all planes to 4/5 or greater to improve ability to perform functional activities.    Time  6    Period  Weeks    Status  New      PT LONG TERM GOAL #4   Title  Patient will report ability to drive with no reports of pain with shoulder motion.    Time  6    Period  Weeks    Status  New      PT LONG TERM GOAL #5   Title  Patient will improve right grip strength to equal left.     Time  6    Period  Weeks    Status  New            Plan - 10/27/17 1612    Clinical Impression Statement  Excellent job today with PAROM of right shoulder into flexion to 145 degrees today.    PT Treatment/Interventions  ADLs/Self Care Home Management;Cryotherapy;Moist Heat;Vasopneumatic Device;Taping;Dry needling;Passive range of motion;Manual techniques;Patient/family education;Therapeutic exercise;Therapeutic activities;Ultrasound;Iontophoresis 4mg /ml Dexamethasone    PT Next Visit Plan  Pulleys, PROM, and gentle AAROM to improve ROM, NO E-STIM.  PT Home Exercise Plan  scapular retractions, wall slides, countertop shoulder flexion, AAROM scaption, AAROM ER    Consulted and Agree with Plan of Care  Patient       Patient will benefit from skilled therapeutic intervention in order to improve the following deficits and impairments:  Pain, Decreased activity tolerance, Decreased range of motion,  Decreased strength, Impaired UE functional use  Visit Diagnosis: Right shoulder pain, unspecified chronicity  Stiffness of right shoulder, not elsewhere classified  Muscle weakness (generalized)     Problem List Patient Active Problem List   Diagnosis Date Noted  . Closed fracture of proximal end of right humerus 09/24/17 10/17/2017  . Allergic rhinitis due to pollen 01/26/2017  . Body mass index 29.0-29.9, adult 03/02/2016  . Palpitation 07/13/2015  . Ovarian abscess due to diverticulitis s/p LSO 11/15/2013 11/16/2013  . 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 CHEST PAIN-UNSPECIFIED 07/03/2010  . CARDIAC MURMUR 05/22/2010  . Anxiety state 11/07/2008  . Complete heart block ([place]) 11/07/2008  . COLONIC POLYPS, HX OF 06/28/2008  . Hyperlipidemia 06/25/2008  . Essential hypertension 06/25/2008  . Mitral valve disorder 06/25/2008  . Cardiomyopathy ([place]) 06/25/2008  . GERD 06/25/2008  . HIATAL HERNIA 06/25/2008    Nolton Denis, [place] MPT 10/27/2017, 4:15 PM  [facility] [address] [place], [place], [address] Phone: [phone]   Fax:  [phone]  Name: MERCI WALTHERS MRN: [id number] Date of Birth: 11/21/1950

## 2017-10-31 ENCOUNTER — Ambulatory Visit: Payer: Medicare Other | Admitting: Physical Therapy

## 2017-10-31 ENCOUNTER — Encounter: Payer: Self-pay | Admitting: Physical Therapy

## 2017-10-31 DIAGNOSIS — M6281 Muscle weakness (generalized): Secondary | ICD-10-CM

## 2017-10-31 DIAGNOSIS — M25511 Pain in right shoulder: Secondary | ICD-10-CM

## 2017-10-31 DIAGNOSIS — M25611 Stiffness of right shoulder, not elsewhere classified: Secondary | ICD-10-CM

## 2017-10-31 NOTE — Therapy (Signed)
[facility] [place], [place], [address] Phone: [phone]   Fax:  [phone]  Physical Therapy Treatment  Patient Details  Name: Carmen Green MRN: [id number] Date of Birth: 1950/12/20 Referring Provider: Carole Civil.   Encounter Date: 10/31/2017  PT End of Session - 10/31/17 0956    Visit Number  3    Number of Visits  18    Date for PT Re-Evaluation  12/13/17    Authorization Type  FOTO every 5th visit, progress note every 10th visit; KX modifier after 15th visit.    PT Start Time  0903    PT Stop Time  0953    PT Time Calculation (min)  50 min    Activity Tolerance  Patient tolerated treatment well    Behavior During Therapy  [facility] for tasks assessed/performed       Past Medical History:  Diagnosis Date  . Allergy   . Anemia   . Anxiety   . Arthritis   . Cataract    bil cataracts removed  . Colon polyp   . Complete heart block ([place])   . Contusion of chest wall 10/10/2007   Centricity Description: CHEST WALL CONTUSION Qualifier: Diagnosis of  By: Aline Brochure MD, Dorothyann Peng   Centricity Description: CONTUSION, RIGHT RIB Qualifier: Diagnosis of  By: Aline Brochure MD, Dorothyann Peng    . Depression   . Diverticulitis   . Esophagitis   . ESOPHAGITIS 06/25/2008   Qualifier: History of  By: Nelson-Smith CMA (AAMA), Dottie    . GERD (gastroesophageal reflux disease)   . H/O seasonal allergies   . Heart murmur   . Hiatal hernia   . 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 Hypertension   . Mitral valve prolapse   . Pacemaker     Past Surgical History:  Procedure Laterality Date  . APPENDECTOMY    . BREAST SURGERY Right    biopsy  . COLONOSCOPY  10/19/2004   LEC  . EYE SURGERY Left    cataract extraction with IOL  . LAPAROSCOPIC LOW ANTERIOR RESECTION  11/16/2013   Robotic  . LAPAROSCOPIC LYSIS OF ADHESIONS N/A 11/15/2013   Procedure:  LAPAROSCOPIC LYSIS OF ADHESIONS;  Surgeon: Adin Hector, MD;  Location: [facility];  Service: General;  Laterality: N/A;  . left ovary cyst removal  1972  . [place], 2004, 2012   device dependant  . POLYPECTOMY    . PROCTOSCOPY  11/15/2013   Procedure: RIGID PROCTOSCOPY;  Surgeon: Adin Hector, MD;  Location: [facility];  Service: General;;  . ROBOTIC ASSISTED SALPINGO OOPHERECTOMY Left 11/16/2013   Robotic en bloc w LAR resection  . TUBAL LIGATION    . UPPER GASTROINTESTINAL ENDOSCOPY      There were no vitals filed for this visit.  Subjective Assessment - 10/31/17 0938    Subjective  I was able to shower this morning.  My elbow has hurt since my fall.  They X-rayed it and said it was fine.    Pain Score  7     Pain Location  Shoulder    Pain Orientation  Right    Pain Descriptors / Indicators  Sore    Pain Onset  More than a month ago                       [facility] Adult PT Treatment/Exercise -  10/31/17 0001      Modalities   Modalities  Vasopneumatic      Vasopneumatic   Number Minutes Vasopneumatic   15 minutes    Vasopnuematic Location   -- Rt shld with pillow between elbow and thorax.    Vasopneumatic Pressure  Low      Manual Therapy   Manual Therapy  Passive ROM    Passive ROM  In supine:  PAROM to patient's right shoulder x 25 minutes into flexion and ER.               PT Short Term Goals - 10/25/17 1213      PT SHORT TERM GOAL #1   Title  Patient will be independent with HEP    Time  3    Period  Weeks    Status  New      PT SHORT TERM GOAL #2   Title  Patient will improve right shoulder AROM flexion to 90 + degrees with less than 2/10 pain.    Time  3    Period  Weeks    Status  New      PT SHORT TERM GOAL #3   Title  Patient will decrease at worst pain to 5/10 or less in order to perform ADLS and functional activities.    Time  3    Period  Weeks    Status  New        PT Long Term Goals - 10/25/17 1217       PT LONG TERM GOAL #1   Title  Patient will improve right shoulder AROM flexion and abduction to 140 + degrees in order to perform functional activities and ADLs.     Time  6    Period  Weeks    Status  New      PT LONG TERM GOAL #2   Title  Patient will improve right shoulder ER to 60+ degrees in order to wash and style hair and don/doff apparel.     Time  6    Period  Weeks      PT LONG TERM GOAL #3   Title  Patient will improve R shoulder MMT in all planes to 4/5 or greater to improve ability to perform functional activities.    Time  6    Period  Weeks    Status  New      PT LONG TERM GOAL #4   Title  Patient will report ability to drive with no reports of pain with shoulder motion.    Time  6    Period  Weeks    Status  New      PT LONG TERM GOAL #5   Title  Patient will improve right grip strength to equal left.     Time  6    Period  Weeks    Status  New            Plan - 10/31/17 0941    Clinical Impression Statement  Excellent job today with gentle ROM to patient's right shoulder.  She states the vaso feels good.    PT Next Visit Plan  Can begin pulleys and seated UE ranger.    Consulted and Agree with Plan of Care  Patient       Patient will benefit from skilled therapeutic intervention in order to improve the following deficits and impairments:     Visit Diagnosis: Right shoulder pain, unspecified chronicity  Stiffness of  right shoulder, not elsewhere classified  Muscle weakness (generalized)     Problem List Patient Active Problem List   Diagnosis Date Noted  . Closed fracture of proximal end of right humerus 09/24/17 10/17/2017  . Allergic rhinitis due to pollen 01/26/2017  . Body mass index 29.0-29.9, adult 03/02/2016  . Palpitation 07/13/2015  . Ovarian abscess due to diverticulitis s/p LSO 11/15/2013 11/16/2013  . 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 CHEST PAIN-UNSPECIFIED 07/03/2010  . CARDIAC  MURMUR 05/22/2010  . Anxiety state 11/07/2008  . Complete heart block ([place]) 11/07/2008  . COLONIC POLYPS, HX OF 06/28/2008  . Hyperlipidemia 06/25/2008  . Essential hypertension 06/25/2008  . Mitral valve disorder 06/25/2008  . Cardiomyopathy ([place]) 06/25/2008  . GERD 06/25/2008  . HIATAL HERNIA 06/25/2008    APPLEGATE, [place] MPT 10/31/2017, 9:58 AM  [facility] [place], [place], [address] Phone: [phone]   Fax:  [phone]  Name: ROMEY COHEA MRN: [id number] Date of Birth: [birth year]

## 2017-11-02 ENCOUNTER — Ambulatory Visit: Payer: Medicare Other | Admitting: Physical Therapy

## 2017-11-02 DIAGNOSIS — M6281 Muscle weakness (generalized): Secondary | ICD-10-CM

## 2017-11-02 DIAGNOSIS — M25511 Pain in right shoulder: Secondary | ICD-10-CM

## 2017-11-02 DIAGNOSIS — M25611 Stiffness of right shoulder, not elsewhere classified: Secondary | ICD-10-CM

## 2017-11-02 NOTE — Therapy (Signed)
[facility] [place], [place], [address] Phone: [phone]   Fax:  [phone]  Physical Therapy Treatment  Patient Details  Name: Carmen Green MRN: [id number] Date of Birth: 01/25/1951 Referring Provider: Carole Civil.   Encounter Date: 11/02/2017  PT End of Session - 11/02/17 0905    Visit Number  4    Number of Visits  18    Date for PT Re-Evaluation  12/13/17    Authorization Type  FOTO every 5th visit, progress note every 10th visit; KX modifier after 15th visit.    PT Start Time  0900    PT Stop Time  0953    PT Time Calculation (min)  53 min    Activity Tolerance  Patient tolerated treatment well    Behavior During Therapy  WFL for tasks assessed/performed       Past Medical History:  Diagnosis Date  . Allergy   . Anemia   . Anxiety   . Arthritis   . Cataract    bil cataracts removed  . Colon polyp   . Complete heart block ([place])   . Contusion of chest wall 10/10/2007   Centricity Description: CHEST WALL CONTUSION Qualifier: Diagnosis of  By: Aline Brochure MD, Dorothyann Peng   Centricity Description: CONTUSION, RIGHT RIB Qualifier: Diagnosis of  By: Aline Brochure MD, Dorothyann Peng    . Depression   . Diverticulitis   . Esophagitis   . ESOPHAGITIS 06/25/2008   Qualifier: History of  By: Nelson-Smith CMA (AAMA), Dottie    . GERD (gastroesophageal reflux disease)   . H/O seasonal allergies   . Heart murmur   . Hiatal hernia   . 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 Hypertension   . Mitral valve prolapse   . Pacemaker     Past Surgical History:  Procedure Laterality Date  . APPENDECTOMY    . BREAST SURGERY Right    biopsy  . COLONOSCOPY  10/19/2004   LEC  . EYE SURGERY Left    cataract extraction with IOL  . LAPAROSCOPIC LOW ANTERIOR RESECTION  11/16/2013   Robotic  . LAPAROSCOPIC LYSIS OF ADHESIONS N/A 11/15/2013   Procedure:  LAPAROSCOPIC LYSIS OF ADHESIONS;  Surgeon: Adin Hector, MD;  Location: [facility];  Service: General;  Laterality: N/A;  . left ovary cyst removal  1972  . [place], 2004, 2012   device dependant  . POLYPECTOMY    . PROCTOSCOPY  11/15/2013   Procedure: RIGID PROCTOSCOPY;  Surgeon: Adin Hector, MD;  Location: [facility];  Service: General;;  . ROBOTIC ASSISTED SALPINGO OOPHERECTOMY Left 11/16/2013   Robotic en bloc w LAR resection  . TUBAL LIGATION    . UPPER GASTROINTESTINAL ENDOSCOPY      There were no vitals filed for this visit.  Subjective Assessment - 11/02/17 0904    Subjective  Patient reports she is getting over a bad headache but she states her shoulder doesn't hurt at rest. After therapy, she reports 5/10 pain.     Patient is accompained by:  Family member    Pertinent History  right proximal humerus fx 09/24/17, PACEMAKER, HTN    Limitations  Lifting;House hold activities    Diagnostic tests  X-Ray: normal healing of fx.    Patient Stated Goals  get back to normal    Currently in Pain?  Yes    Pain Score  5     Pain Location  Shoulder    Pain Orientation  Right    Pain Descriptors / Indicators  Sore    Pain Type  Chronic pain    Pain Onset  More than a month ago         [facility] PT Assessment - 11/02/17 0001      Assessment   Medical Diagnosis  Right closed non-displaced fracture of the proximal end of the humerus    Onset Date/Surgical Date  09/24/17    Hand Dominance  Right    Next MD Visit  10/30/17    Prior Therapy  no      PROM   Right Shoulder External Rotation  65 Degrees 45 degrees of ABD                   [facility] Adult PT Treatment/Exercise - 11/02/17 0001      Exercises   Exercises  Shoulder      Shoulder Exercises: Pulleys   Flexion  Other (comment) 4 minutes    Other Pulley Exercises  seated UE ranger flexion, cw, ccw x20 each      Modalities   Modalities  Vasopneumatic      Vasopneumatic   Number Minutes  Vasopneumatic   15 minutes    Vasopnuematic Location   Shoulder    Vasopneumatic Pressure  Low      Manual Therapy   Manual Therapy  Passive ROM    Passive ROM  R shoulder flexion and ER to improve ROM, gentle rhythmic stabilization in ER and 45 and 90 degrees 10 second x3 each               PT Short Term Goals - 11/02/17 1031      PT SHORT TERM GOAL #1   Title  Patient will be independent with HEP    Time  3    Period  Weeks    Status  Achieved      PT SHORT TERM GOAL #2   Title  Patient will improve right shoulder AROM flexion to 90 + degrees with less than 2/10 pain.    Time  3    Period  Weeks    Status  Achieved      PT SHORT TERM GOAL #3   Title  Patient will decrease at worst pain to 5/10 or less in order to perform ADLS and functional activities.    Time  3    Period  Weeks    Status  On-going        PT Long Term Goals - 10/25/17 1217      PT LONG TERM GOAL #1   Title  Patient will improve right shoulder AROM flexion and abduction to 140 + degrees in order to perform functional activities and ADLs.     Time  6    Period  Weeks    Status  New      PT LONG TERM GOAL #2   Title  Patient will improve right shoulder ER to 60+ degrees in order to wash and style hair and don/doff apparel.     Time  6    Period  Weeks      PT LONG TERM GOAL #3   Title  Patient will improve R shoulder MMT in all planes to 4/5 or greater to improve ability to perform functional activities.    Time  6    Period  Weeks  Status  New      PT LONG TERM GOAL #4   Title  Patient will report ability to drive with no reports of pain with shoulder motion.    Time  6    Period  Weeks    Status  New      PT LONG TERM GOAL #5   Title  Patient will improve right grip strength to equal left.     Time  6    Period  Weeks    Status  New            Plan - 11/02/17 1029    Clinical Impression Statement  Patient was able to tolerate treatment well with new AAROM exercises.  Patient noted with smooth arc of motion with PROM flexion and ER. 65 degrees PROM ER today. Normal response to vaso upon removal.    Clinical Presentation  Stable    Clinical Decision Making  Low    Rehab Potential  Good    PT Frequency  3x / week    PT Duration  6 weeks    PT Treatment/Interventions  ADLs/Self Care Home Management;Cryotherapy;Moist Heat;Vasopneumatic Device;Taping;Dry needling;Passive range of motion;Manual techniques;Patient/family education;Therapeutic exercise;Therapeutic activities;Ultrasound;Iontophoresis 4mg /ml Dexamethasone    PT Next Visit Plan  FOTO next visit. Continue pulleys and seated UE ranger and AAROM exercises, PROM to improve ROM. Vaso only.    Consulted and Agree with Plan of Care  Patient       Patient will benefit from skilled therapeutic intervention in order to improve the following deficits and impairments:  Pain, Decreased activity tolerance, Decreased range of motion, Decreased strength, Impaired UE functional use  Visit Diagnosis: Right shoulder pain, unspecified chronicity  Stiffness of right shoulder, not elsewhere classified  Muscle weakness (generalized)     Problem List Patient Active Problem List   Diagnosis Date Noted  . Closed fracture of proximal end of right humerus 09/24/17 10/17/2017  . Allergic rhinitis due to pollen 01/26/2017  . Body mass index 29.0-29.9, adult 03/02/2016  . Palpitation 07/13/2015  . Ovarian abscess due to diverticulitis s/p LSO 11/15/2013 11/16/2013  . 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 CHEST PAIN-UNSPECIFIED 07/03/2010  . CARDIAC MURMUR 05/22/2010  . Anxiety state 11/07/2008  . Complete heart block ([place]) 11/07/2008  . COLONIC POLYPS, HX OF 06/28/2008  . Hyperlipidemia 06/25/2008  . Essential hypertension 06/25/2008  . Mitral valve disorder 06/25/2008  . Cardiomyopathy ([place]) 06/25/2008  . GERD 06/25/2008  . HIATAL HERNIA 06/25/2008    Gabriela Eves, PT,  DPT 11/02/2017, 10:38 AM  [facility] [address] [place], [place], [address] Phone: [phone]   Fax:  [phone]  Name: IRIANA ARTLEY MRN: [id number] Date of Birth: 06/07/1951

## 2017-11-04 ENCOUNTER — Ambulatory Visit: Payer: Medicare Other | Admitting: Physical Therapy

## 2017-11-04 ENCOUNTER — Encounter: Payer: Self-pay | Admitting: Physical Therapy

## 2017-11-04 DIAGNOSIS — M25511 Pain in right shoulder: Secondary | ICD-10-CM | POA: Diagnosis not present

## 2017-11-04 DIAGNOSIS — M6281 Muscle weakness (generalized): Secondary | ICD-10-CM | POA: Diagnosis not present

## 2017-11-04 DIAGNOSIS — M25611 Stiffness of right shoulder, not elsewhere classified: Secondary | ICD-10-CM

## 2017-11-04 LAB — CUP PACEART REMOTE DEVICE CHECK
Battery Remaining Longevity: 61 mo
Battery Voltage: 2.86 V
Brady Statistic AS VS Percent: 1.6 %
Brady Statistic RV Percent Paced: 96 %
Implantable Lead Implant Date: 19920319
Implantable Lead Location: 753859
Implantable Pulse Generator Implant Date: 20120803
Lead Channel Impedance Value: 440 Ohm
Lead Channel Pacing Threshold Pulse Width: 0.5 ms
Lead Channel Sensing Intrinsic Amplitude: 1.8 mV
Lead Channel Sensing Intrinsic Amplitude: 10.3 mV
Lead Channel Setting Pacing Amplitude: 2 V
Lead Channel Setting Pacing Pulse Width: 0.5 ms
MDC IDC LEAD IMPLANT DT: 19920319
MDC IDC LEAD LOCATION: 753860
MDC IDC MSMT BATTERY REMAINING PERCENTAGE: 46 %
MDC IDC MSMT LEADCHNL RV IMPEDANCE VALUE: 490 Ohm
MDC IDC MSMT LEADCHNL RV PACING THRESHOLD AMPLITUDE: 1.75 V
MDC IDC PG SERIAL: 7252855
MDC IDC SESS DTM: 20190429073312
MDC IDC SET LEADCHNL RV SENSING SENSITIVITY: 4 mV
MDC IDC STAT BRADY AS VP PERCENT: 95 %

## 2017-11-04 NOTE — Therapy (Signed)
[facility] [place], [place], [address] Phone: [phone]   Fax:  [phone]  Physical Therapy Treatment  Patient Details  Name: Carmen Green MRN: [id number] Date of Birth: 1950-09-12 Referring Provider: Carole Civil.   Encounter Date: 11/04/2017  PT End of Session - 11/04/17 0814    Visit Number  5    Number of Visits  18    Date for PT Re-Evaluation  12/13/17    Authorization Type  FOTO every 5th visit, progress note every 10th visit; KX modifier after 15th visit.    PT Start Time  [phone]    PT Stop Time  0905    PT Time Calculation (min)  49 min    Activity Tolerance  Patient tolerated treatment well    Behavior During Therapy  [facility] for tasks assessed/performed       Past Medical History:  Diagnosis Date  . Allergy   . Anemia   . Anxiety   . Arthritis   . Cataract    bil cataracts removed  . Colon polyp   . Complete heart block ([address])   . Contusion of chest wall 10/10/2007   Centricity Description: CHEST WALL CONTUSION Qualifier: Diagnosis of  By: Aline Brochure MD, Dorothyann Peng   Centricity Description: CONTUSION, RIGHT RIB Qualifier: Diagnosis of  By: Aline Brochure MD, Dorothyann Peng    . Depression   . Diverticulitis   . Esophagitis   . ESOPHAGITIS 06/25/2008   Qualifier: History of  By: Nelson-Smith CMA (AAMA), Dottie    . GERD (gastroesophageal reflux disease)   . H/O seasonal allergies   . Heart murmur   . Hiatal hernia   . 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 Hypertension   . Mitral valve prolapse   . Pacemaker     Past Surgical History:  Procedure Laterality Date  . APPENDECTOMY    . BREAST SURGERY Right    biopsy  . COLONOSCOPY  10/19/2004   LEC  . EYE SURGERY Left    cataract extraction with IOL  . LAPAROSCOPIC LOW ANTERIOR RESECTION  11/16/2013   Robotic  . LAPAROSCOPIC LYSIS OF ADHESIONS N/A 11/15/2013   Procedure:  LAPAROSCOPIC LYSIS OF ADHESIONS;  Surgeon: Adin Hector, MD;  Location: [facility];  Service: General;  Laterality: N/A;  . left ovary cyst removal  1972  . [place], 2004, 2012   device dependant  . POLYPECTOMY    . PROCTOSCOPY  11/15/2013   Procedure: RIGID PROCTOSCOPY;  Surgeon: Adin Hector, MD;  Location: [facility];  Service: General;;  . ROBOTIC ASSISTED SALPINGO OOPHERECTOMY Left 11/16/2013   Robotic en bloc w LAR resection  . TUBAL LIGATION    . UPPER GASTROINTESTINAL ENDOSCOPY      There were no vitals filed for this visit.  Subjective Assessment - 11/04/17 0814    Subjective  Reports uncomfortable sensation in R shoulder. Reports a hard workout during previous treatment and then trying to iron last night and use mostly LUE.    Patient is accompained by:  Family member    Pertinent History  right proximal humerus fx 09/24/17, PACEMAKER, HTN    Limitations  Lifting;House hold activities    Diagnostic tests  X-Ray: normal healing of fx.    Patient Stated Goals  get back to normal    Currently in Pain?  Yes    Pain Score  2  Pain Location  Shoulder    Pain Orientation  Right    Pain Descriptors / Indicators  Discomfort    Pain Type  Chronic pain    Pain Onset  More than a month ago    Pain Frequency  Intermittent    Aggravating Factors   Movement    Pain Relieving Factors  Rest, ice, medication         [facility] PT Assessment - 11/04/17 0001      Assessment   Medical Diagnosis  Right closed non-displaced fracture of the proximal end of the humerus    Onset Date/Surgical Date  09/24/17    Hand Dominance  Right    Next MD Visit  11/30/2017    Prior Therapy  no                   [facility] Adult PT Treatment/Exercise - 11/04/17 0001      Shoulder Exercises: Pulleys   Flexion  5 minutes    Other Pulley Exercises  seated UE ranger flexion, cw, ccw x20 each      Modalities   Modalities  Vasopneumatic      Vasopneumatic   Number Minutes  Vasopneumatic   15 minutes    Vasopnuematic Location   Shoulder    Vasopneumatic Pressure  Low    Vasopneumatic Temperature   66      Manual Therapy   Manual Therapy  Passive ROM    Passive ROM  PROM of R shoulder into flexion, ER, IR with holds at end range               PT Short Term Goals - 11/02/17 1031      PT SHORT TERM GOAL #1   Title  Patient will be independent with HEP    Time  3    Period  Weeks    Status  Achieved      PT SHORT TERM GOAL #2   Title  Patient will improve right shoulder AROM flexion to 90 + degrees with less than 2/10 pain.    Time  3    Period  Weeks    Status  Achieved      PT SHORT TERM GOAL #3   Title  Patient will decrease at worst pain to 5/10 or less in order to perform ADLS and functional activities.    Time  3    Period  Weeks    Status  On-going        PT Long Term Goals - 10/25/17 1217      PT LONG TERM GOAL #1   Title  Patient will improve right shoulder AROM flexion and abduction to 140 + degrees in order to perform functional activities and ADLs.     Time  6    Period  Weeks    Status  New      PT LONG TERM GOAL #2   Title  Patient will improve right shoulder ER to 60+ degrees in order to wash and style hair and don/doff apparel.     Time  6    Period  Weeks      PT LONG TERM GOAL #3   Title  Patient will improve R shoulder MMT in all planes to 4/5 or greater to improve ability to perform functional activities.    Time  6    Period  Weeks    Status  New      PT LONG TERM GOAL #4  Title  Patient will report ability to drive with no reports of pain with shoulder motion.    Time  6    Period  Weeks    Status  New      PT LONG TERM GOAL #5   Title  Patient will improve right grip strength to equal left.     Time  6    Period  Weeks    Status  New            Plan - 11/04/17 0854    Clinical Impression Statement  Patient tolerated today's treatment fairly well as she arrived with reports of  uncomfortable sensation in R shoulder following previous treatment as well as attempting to iron. Patient initially reported discomfort with PROM of R shoulder. Frequent oscillations were provided to the RUE to promote relaxation and reduce pain. More limitation noted with PROM of R shoulder into ER. Normal vasopnuematic response noted following removal of the modality. 5th visit FOTO score of 56%.    Rehab Potential  Good    PT Frequency  3x / week    PT Duration  6 weeks    PT Treatment/Interventions  ADLs/Self Care Home Management;Cryotherapy;Moist Heat;Vasopneumatic Device;Taping;Dry needling;Passive range of motion;Manual techniques;Patient/family education;Therapeutic exercise;Therapeutic activities;Ultrasound;Iontophoresis 4mg /ml Dexamethasone    PT Next Visit Plan  Continue AAROM exercises per protocol with PROM and modalities PRN. VASO ONLY    PT Home Exercise Plan  scapular retractions, wall slides, countertop shoulder flexion, AAROM scaption, AAROM ER    Consulted and Agree with Plan of Care  Patient       Patient will benefit from skilled therapeutic intervention in order to improve the following deficits and impairments:  Pain, Decreased activity tolerance, Decreased range of motion, Decreased strength, Impaired UE functional use  Visit Diagnosis: Right shoulder pain, unspecified chronicity  Stiffness of right shoulder, not elsewhere classified  Muscle weakness (generalized)     Problem List Patient Active Problem List   Diagnosis Date Noted  . Closed fracture of proximal end of right humerus 09/24/17 10/17/2017  . Allergic rhinitis due to pollen 01/26/2017  . Body mass index 29.0-29.9, adult 03/02/2016  . Palpitation 07/13/2015  . Ovarian abscess due to diverticulitis s/p LSO 11/15/2013 11/16/2013  . 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 CHEST PAIN-UNSPECIFIED 07/03/2010  . CARDIAC MURMUR 05/22/2010  . Anxiety state 11/07/2008  .  Complete heart block ([place]) 11/07/2008  . COLONIC POLYPS, HX OF 06/28/2008  . Hyperlipidemia 06/25/2008  . Essential hypertension 06/25/2008  . Mitral valve disorder 06/25/2008  . Cardiomyopathy ([place]) 06/25/2008  . GERD 06/25/2008  . HIATAL HERNIA 06/25/2008    Standley Brooking, PTA 11/04/2017, 9:10 AM  [facility] [address] [place], [place], [address] Phone: [phone]   Fax:  [phone]  Name: Carmen Green MRN: [id number] Date of Birth: Aug 29, 1950

## 2017-11-08 ENCOUNTER — Ambulatory Visit: Payer: Medicare Other | Admitting: Physical Therapy

## 2017-11-08 DIAGNOSIS — M25511 Pain in right shoulder: Secondary | ICD-10-CM | POA: Diagnosis not present

## 2017-11-08 DIAGNOSIS — M25611 Stiffness of right shoulder, not elsewhere classified: Secondary | ICD-10-CM | POA: Diagnosis not present

## 2017-11-08 DIAGNOSIS — M6281 Muscle weakness (generalized): Secondary | ICD-10-CM | POA: Diagnosis not present

## 2017-11-08 NOTE — Therapy (Signed)
[facility] [place], [place], [address] Phone: [phone]   Fax:  [phone]  Physical Therapy Treatment  Patient Details  Name: Carmen Green MRN: [id number] Date of Birth: 09-Apr-1951 Referring Provider: Carole Civil.   Encounter Date: 11/08/2017  PT End of Session - 11/08/17 0907    Visit Number  6    Number of Visits  18    Date for PT Re-Evaluation  12/13/17    Authorization Type  FOTO every 5th visit, progress note every 10th visit; KX modifier after 15th visit.    PT Start Time  0900    PT Stop Time  0958    PT Time Calculation (min)  58 min    Activity Tolerance  Patient tolerated treatment well    Behavior During Therapy  WFL for tasks assessed/performed       Past Medical History:  Diagnosis Date  . Allergy   . Anemia   . Anxiety   . Arthritis   . Cataract    bil cataracts removed  . Colon polyp   . Complete heart block ([place])   . Contusion of chest wall 10/10/2007   Centricity Description: CHEST WALL CONTUSION Qualifier: Diagnosis of  By: Aline Brochure MD, Dorothyann Peng   Centricity Description: CONTUSION, RIGHT RIB Qualifier: Diagnosis of  By: Aline Brochure MD, Dorothyann Peng    . Depression   . Diverticulitis   . Esophagitis   . ESOPHAGITIS 06/25/2008   Qualifier: History of  By: Nelson-Smith CMA (AAMA), Dottie    . GERD (gastroesophageal reflux disease)   . H/O seasonal allergies   . Heart murmur   . Hiatal hernia   . 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 Hypertension   . Mitral valve prolapse   . Pacemaker     Past Surgical History:  Procedure Laterality Date  . APPENDECTOMY    . BREAST SURGERY Right    biopsy  . COLONOSCOPY  10/19/2004   LEC  . EYE SURGERY Left    cataract extraction with IOL  . LAPAROSCOPIC LOW ANTERIOR RESECTION  11/16/2013   Robotic  . LAPAROSCOPIC LYSIS OF ADHESIONS N/A 11/15/2013   Procedure:  LAPAROSCOPIC LYSIS OF ADHESIONS;  Surgeon: Adin Hector, MD;  Location: [facility];  Service: General;  Laterality: N/A;  . left ovary cyst removal  1972  . [place], 2004, 2012   device dependant  . POLYPECTOMY    . PROCTOSCOPY  11/15/2013   Procedure: RIGID PROCTOSCOPY;  Surgeon: Adin Hector, MD;  Location: [facility];  Service: General;;  . ROBOTIC ASSISTED SALPINGO OOPHERECTOMY Left 11/16/2013   Robotic en bloc w LAR resection  . TUBAL LIGATION    . UPPER GASTROINTESTINAL ENDOSCOPY      There were no vitals filed for this visit.  Subjective Assessment - 11/08/17 0907    Subjective  Patient reported feeling very sore after last session but feels alright today. Patient reports she has been performing more functional activities at home with some pain.    Patient is accompained by:  Family member    Pertinent History  right proximal humerus fx 09/24/17, PACEMAKER, HTN    Limitations  Lifting;House hold activities    Diagnostic tests  X-Ray: normal healing of fx.    Patient Stated Goals  get back to normal    Currently in Pain?  Yes    Pain Score  3     Pain Location  Shoulder    Pain Orientation  Right    Pain Descriptors / Indicators  Discomfort    Pain Type  Chronic pain    Pain Onset  More than a month ago         [facility] PT Assessment - 11/08/17 0001      Assessment   Medical Diagnosis  Right closed non-displaced fracture of the proximal end of the humerus    Onset Date/Surgical Date  09/24/17    Hand Dominance  Right    Next MD Visit  11/30/2017    Prior Therapy  no      AROM   Right Shoulder Flexion  135 Degrees      PROM   Right Shoulder Flexion  140 Degrees                   [facility] Adult PT Treatment/Exercise - 11/08/17 0001      Shoulder Exercises: Pulleys   Flexion  5 minutes    Other Pulley Exercises  seated UE ranger flexion, cw, ccw x30 each      Shoulder Exercises: Therapy Ball   Other Therapy Ball Exercises  Ball ABCs A-Z x1       Shoulder Exercises: Isometric Strengthening   Flexion  Other (comment) 3"x10    ADduction  Other (comment) 3"x10      Modalities   Modalities  Vasopneumatic      Vasopneumatic   Number Minutes Vasopneumatic   15 minutes    Vasopnuematic Location   Shoulder    Vasopneumatic Pressure  Low    Vasopneumatic Temperature   34      Manual Therapy   Manual Therapy  Passive ROM    Passive ROM  PROM of R shoulder into flexion, ER, IR with holds at end range               PT Short Term Goals - 11/02/17 1031      PT SHORT TERM GOAL #1   Title  Patient will be independent with HEP    Time  3    Period  Weeks    Status  Achieved      PT SHORT TERM GOAL #2   Title  Patient will improve right shoulder AROM flexion to 90 + degrees with less than 2/10 pain.    Time  3    Period  Weeks    Status  Achieved      PT SHORT TERM GOAL #3   Title  Patient will decrease at worst pain to 5/10 or less in order to perform ADLS and functional activities.    Time  3    Period  Weeks    Status  On-going        PT Long Term Goals - 10/25/17 1217      PT LONG TERM GOAL #1   Title  Patient will improve right shoulder AROM flexion and abduction to 140 + degrees in order to perform functional activities and ADLs.     Time  6    Period  Weeks    Status  New      PT LONG TERM GOAL #2   Title  Patient will improve right shoulder ER to 60+ degrees in order to wash and style hair and don/doff apparel.     Time  6    Period  Weeks      PT LONG TERM GOAL #3  Title  Patient will improve R shoulder MMT in all planes to 4/5 or greater to improve ability to perform functional activities.    Time  6    Period  Weeks    Status  New      PT LONG TERM GOAL #4   Title  Patient will report ability to drive with no reports of pain with shoulder motion.    Time  6    Period  Weeks    Status  New      PT LONG TERM GOAL #5   Title  Patient will improve right grip strength to equal left.      Time  6    Period  Weeks    Status  New            Plan - 11/08/17 1108    Clinical Impression Statement  Patient was able to tolerate treatment well with minimal reports of pain. Patient provided with AAROM flexion HEP. Patient demonstrated good form. Normal response to modalities upon removal.    Clinical Presentation  Stable    Clinical Decision Making  Low    Rehab Potential  Good    PT Frequency  3x / week    PT Duration  6 weeks    PT Treatment/Interventions  ADLs/Self Care Home Management;Cryotherapy;Moist Heat;Vasopneumatic Device;Taping;Dry needling;Passive range of motion;Manual techniques;Patient/family education;Therapeutic exercise;Therapeutic activities;Ultrasound;Iontophoresis 4mg /ml Dexamethasone    PT Next Visit Plan  Continue AAROM exercises per protocol with PROM and modalities PRN. VASO ONLY    Consulted and Agree with Plan of Care  Patient       Patient will benefit from skilled therapeutic intervention in order to improve the following deficits and impairments:  Pain, Decreased activity tolerance, Decreased range of motion, Decreased strength, Impaired UE functional use  Visit Diagnosis: Right shoulder pain, unspecified chronicity  Stiffness of right shoulder, not elsewhere classified  Muscle weakness (generalized)     Problem List Patient Active Problem List   Diagnosis Date Noted  . Closed fracture of proximal end of right humerus 09/24/17 10/17/2017  . Allergic rhinitis due to pollen 01/26/2017  . Body mass index 29.0-29.9, adult 03/02/2016  . Palpitation 07/13/2015  . Ovarian abscess due to diverticulitis s/p LSO 11/15/2013 11/16/2013  . 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 CHEST PAIN-UNSPECIFIED 07/03/2010  . CARDIAC MURMUR 05/22/2010  . Anxiety state 11/07/2008  . Complete heart block ([place]) 11/07/2008  . COLONIC POLYPS, HX OF 06/28/2008  . Hyperlipidemia 06/25/2008  . Essential hypertension 06/25/2008   . Mitral valve disorder 06/25/2008  . Cardiomyopathy ([place]) 06/25/2008  . GERD 06/25/2008  . HIATAL HERNIA 06/25/2008   Gabriela Eves, PT, DPT 11/08/2017, 11:16 AM  [facility] [address] [place], [place], [address] Phone: [phone]   Fax:  [phone]  Name: Carmen Green MRN: [id number] Date of Birth: 12/14/1950

## 2017-11-09 ENCOUNTER — Ambulatory Visit: Payer: Medicare Other | Admitting: Gastroenterology

## 2017-11-09 ENCOUNTER — Encounter: Payer: Self-pay | Admitting: Gastroenterology

## 2017-11-09 VITALS — BP 138/80 | HR 72 | Ht 60.0 in | Wt 147.2 lb

## 2017-11-09 DIAGNOSIS — R12 Heartburn: Secondary | ICD-10-CM

## 2017-11-09 DIAGNOSIS — K219 Gastro-esophageal reflux disease without esophagitis: Secondary | ICD-10-CM | POA: Diagnosis not present

## 2017-11-09 DIAGNOSIS — K5909 Other constipation: Secondary | ICD-10-CM

## 2017-11-09 MED ORDER — RANITIDINE HCL 150 MG PO TABS: 150.0000 mg | ORAL_TABLET | Freq: Every day | ORAL | 11 refills | Status: DC

## 2017-11-09 MED ORDER — ESOMEPRAZOLE MAGNESIUM 40 MG PO CPDR: 40.0000 mg | DELAYED_RELEASE_CAPSULE | Freq: Every day | ORAL | 3 refills | Status: DC

## 2017-11-09 NOTE — Patient Instructions (Signed)
We have sent Nexium 40 mg to your pharmacy  We have sent refills of Zantac to your pharmacy  Use OTC Gaviscon after meals as needed three times a day  We have given you Fiber Choice Samples to take 1-2 tablets daily  Follow up in 6 months   Gastroesophageal Reflux Disease, Adult Normally, food travels down the esophagus and stays in the stomach to be digested. However, when a person has gastroesophageal reflux disease (GERD), food and stomach acid move back up into the esophagus. When this happens, the esophagus becomes sore and inflamed. Over time, GERD can create small holes (ulcers) in the lining of the esophagus. What are the causes? This condition is caused by a problem with the muscle between the esophagus and the stomach (lower esophageal sphincter, or LES). Normally, the LES muscle closes after food passes through the esophagus to the stomach. When the LES is weakened or abnormal, it does not close properly, and that allows food and stomach acid to go back up into the esophagus. The LES can be weakened by certain dietary substances, medicines, and medical conditions, including:  Tobacco use.  Pregnancy.  Having a hiatal hernia.  Heavy alcohol use.  Certain foods and beverages, such as coffee, chocolate, onions, and peppermint.  What increases the risk? This condition is more likely to develop in:  People who have an increased body weight.  People who have connective tissue disorders.  People who use NSAID medicines.  What are the signs or symptoms? Symptoms of this condition include:  Heartburn.  Difficult or painful swallowing.  The feeling of having a lump in the throat.  Abitter taste in the mouth.  Bad breath.  Having a large amount of saliva.  Having an upset or bloated stomach.  Belching.  Chest pain.  Shortness of breath or wheezing.  Ongoing (chronic) cough or a night-time cough.  Wearing away of tooth enamel.  Weight loss.  Different  conditions can cause chest pain. Make sure to see your health care provider if you experience chest pain. How is this diagnosed? Your health care provider will take a medical history and perform a physical exam. To determine if you have mild or severe GERD, your health care provider may also monitor how you respond to treatment. You may also have other tests, including:  An endoscopy toexamine your stomach and esophagus with a small camera.  A test thatmeasures the acidity level in your esophagus.  A test thatmeasures how much pressure is on your esophagus.  A barium swallow or modified barium swallow to show the shape, size, and functioning of your esophagus.  How is this treated? The goal of treatment is to help relieve your symptoms and to prevent complications. Treatment for this condition may vary depending on how severe your symptoms are. Your health care provider may recommend:  Changes to your diet.  Medicine.  Surgery.  Follow these instructions at home: Diet  Follow a diet as recommended by your health care provider. This may involve avoiding foods and drinks such as: ? Coffee and tea (with or without caffeine). ? Drinks that containalcohol. ? Energy drinks and sports drinks. ? Carbonated drinks or sodas. ? Chocolate and cocoa. ? Peppermint and mint flavorings. ? Garlic and onions. ? Horseradish. ? Spicy and acidic foods, including peppers, chili powder, curry powder, vinegar, hot sauces, and barbecue sauce. ? Citrus fruit juices and citrus fruits, such as oranges, lemons, and limes. ? Tomato-based foods, such as red sauce, chili, salsa,  and pizza with red sauce. ? Fried and fatty foods, such as donuts, french fries, potato chips, and high-fat dressings. ? High-fat meats, such as hot dogs and fatty cuts of red and white meats, such as rib eye steak, sausage, ham, and bacon. ? High-fat dairy items, such as whole milk, butter, and cream cheese.  Eat small, frequent  meals instead of large meals.  Avoid drinking large amounts of liquid with your meals.  Avoid eating meals during the 2-3 hours before bedtime.  Avoid lying down right after you eat.  Do not exercise right after you eat. General instructions  Pay attention to any changes in your symptoms.  Take over-the-counter and prescription medicines only as told by your health care provider. Do not take aspirin, ibuprofen, or other NSAIDs unless your health care provider told you to do so.  Do not use any tobacco products, including cigarettes, chewing tobacco, and e-cigarettes. If you need help quitting, ask your health care provider.  Wear loose-fitting clothing. Do not wear anything tight around your waist that causes pressure on your abdomen.  Raise (elevate) the head of your bed 6 inches (15cm).  Try to reduce your stress, such as with yoga or meditation. If you need help reducing stress, ask your health care provider.  If you are overweight, reduce your weight to an amount that is healthy for you. Ask your health care provider for guidance about a safe weight loss goal.  Keep all follow-up visits as told by your health care provider. This is important. Contact a health care provider if:  You have new symptoms.  You have unexplained weight loss.  You have difficulty swallowing, or it hurts to swallow.  You have wheezing or a persistent cough.  Your symptoms do not improve with treatment.  You have a hoarse voice. Get help right away if:  You have pain in your arms, neck, jaw, teeth, or back.  You feel sweaty, dizzy, or light-headed.  You have chest pain or shortness of breath.  You vomit and your vomit looks like blood or coffee grounds.  You faint.  Your stool is bloody or black.  You cannot swallow, drink, or eat. This information is not intended to replace advice given to you by your health care provider. Make sure you discuss any questions you have with your health  care provider. Document Released: 03/17/2005 Document Revised: 11/05/2015 Document Reviewed: 10/02/2014 Elsevier Interactive Patient Education  2018 [place] for Gastroesophageal Reflux Disease, Adult When you have gastroesophageal reflux disease (GERD), the foods you eat and your eating habits are very important. Choosing the right foods can help ease your discomfort. What guidelines do I need to follow?  Choose fruits, vegetables, whole grains, and low-fat dairy products.  Choose low-fat meat, fish, and poultry.  Limit fats such as oils, salad dressings, butter, nuts, and avocado.  Keep a food diary. This helps you identify foods that cause symptoms.  Avoid foods that cause symptoms. These may be different for everyone.  Eat small meals often instead of 3 large meals a day.  Eat your meals slowly, in a place where you are relaxed.  Limit fried foods.  Cook foods using methods other than frying.  Avoid drinking alcohol.  Avoid drinking large amounts of liquids with your meals.  Avoid bending over or lying down until 2-3 hours after eating. What foods are not recommended? These are some foods and drinks that may make your symptoms worse: Vegetables  Tomatoes. Tomato juice. Tomato and spaghetti sauce. Chili peppers. Onion and garlic. Horseradish. Fruits Oranges, grapefruit, and lemon (fruit and juice). Meats High-fat meats, fish, and poultry. This includes hot dogs, ribs, ham, sausage, salami, and bacon. Dairy Whole milk and chocolate milk. Sour cream. Cream. Butter. Ice cream. Cream cheese. Drinks Coffee and tea. Bubbly (carbonated) drinks or energy drinks. Condiments Hot sauce. Barbecue sauce. Sweets/Desserts Chocolate and cocoa. Donuts. Peppermint and spearmint. Fats and Oils High-fat foods. This includes [place] fries and potato chips. Other Vinegar. Strong spices. This includes black pepper, white pepper, red pepper, cayenne, curry powder,  cloves, ginger, and chili powder. The items listed above may not be a complete list of foods and drinks to avoid. Contact your dietitian for more information. This information is not intended to replace advice given to you by your health care provider. Make sure you discuss any questions you have with your health care provider. Document Released: 12/07/2011 Document Revised: 11/13/2015 Document Reviewed: 04/11/2013 Elsevier Interactive Patient Education  2017 [employer].

## 2017-11-09 NOTE — Progress Notes (Signed)
Carmen Green    [id number]    [birth year]  Primary Care Physician:Jones, Londell Moh, PA-C  Referring Physician: Terald Sleeper, PA-C [place], [place] [id number]  Chief complaint: GERD, belching HPI:  67 year old female with history of chronic GERD, last seen in office August 2017 is here for follow-up visit.  Patient complains of persistent GERD symptoms, is on omeprazole and continues to have significant heartburn.  Denies any dysphagia, odynophagia, nausea, vomiting or abdominal pain.  She also has excessive flatulence and belching. EGD February 12, 2016 showed irregular Z line, wide open Schatzki's ring otherwise normal exam Colonoscopy December 04, 2010 severe sigmoid diverticulosis otherwise normal exam, recall in 10 years  Outpatient Encounter Medications as of 11/09/2017  Medication Sig  . acetaminophen (TYLENOL 8 HOUR) 650 MG CR tablet Take 1 tablet (650 mg total) by mouth every 8 (eight) hours.  Marland Kitchen buPROPion (WELLBUTRIN XL) 150 MG 24 hr tablet TAKE 2 TABLETS BY MOUTH DAILY  . cetirizine (ZYRTEC) 10 MG tablet Take 10 mg by mouth daily as needed for allergies.   . Cholecalciferol (VITAMIN D3) 1000 units CAPS Take 1 capsule by mouth daily.  Marland Kitchen estradiol (ESTRACE) 0.5 MG tablet Take 1 tablet (0.5 mg total) by mouth daily.  . fluticasone (FLONASE) 50 MCG/ACT nasal spray Place 1 spray into both nostrils daily as needed for allergies.  Marland Kitchen guaiFENesin (MUCINEX) 600 MG 12 hr tablet Take 600 mg by mouth 2 (two) times daily as needed for cough.   Marland Kitchen HYDROcodone-acetaminophen (NORCO/VICODIN) 5-325 MG tablet Take 1 tablet by mouth every 6 (six) hours as needed.  Marland Kitchen lisinopril-hydrochlorothiazide (PRINZIDE,ZESTORETIC) 20-12.5 MG tablet Take 1 tablet by mouth every morning.  Marland Kitchen LORazepam (ATIVAN) 1 MG tablet TAKE 1 TABLET EVERY 8 HOURS AS NEEDED FOR ANXIETY  . medroxyPROGESTERone (PROVERA) 2.5 MG tablet Take 1 tablet (2.5 mg total) by mouth daily.  . meloxicam (MOBIC) 7.5 MG tablet  Take 1 tablet (7.5 mg total) by mouth daily. (Patient taking differently: Take 7.5 mg by mouth as needed. )  . Multiple Vitamin (MULTIVITAMIN) tablet Take 1 tablet by mouth daily.    Marland Kitchen omeprazole (PRILOSEC) 40 MG capsule TAKE (1) CAPSULE DAILY  . polyethylene glycol powder (GLYCOLAX/MIRALAX) powder Take 1 Container by mouth as needed for mild constipation.   . ranitidine (ZANTAC) 150 MG tablet Take 1 tablet (150 mg total) by mouth at bedtime.  . clindamycin (CLEOCIN) 150 MG capsule TAKE 4 CAPSULES 1 HOUR PRIOR TO DENTAL VISIT (Patient not taking: Reported on 09/24/2017)  . esomeprazole (NEXIUM) 40 MG capsule Take 1 capsule (40 mg total) by mouth daily at 12 noon.  . ranitidine (ZANTAC) 150 MG tablet Take 1 tablet (150 mg total) by mouth at bedtime.   No facility-administered encounter medications on file as of 11/09/2017.     Allergies as of 11/09/2017 - Review Complete 11/09/2017  Allergen Reaction Noted  . Amoxapine and related  07/11/2015  . Cephalexin Rash 10/10/2007  . Oxycodone  12/05/2013  . Penicillins Rash 10/10/2007    Past Medical History:  Diagnosis Date  . Allergy   . Anemia   . Anxiety   . Arthritis   . Cataract    bil cataracts removed  . Colon polyp   . Complete heart block ([place])   . Contusion of chest wall 10/10/2007   Centricity Description: CHEST WALL CONTUSION Qualifier: Diagnosis of  By: Aline Brochure MD, Dorothyann Peng   Centricity Description: CONTUSION,  RIGHT RIB Qualifier: Diagnosis of  By: Aline Brochure MD, Dorothyann Peng    . Depression   . Diverticulitis   . Esophagitis   . ESOPHAGITIS 06/25/2008   Qualifier: History of  By: Nelson-Smith CMA (AAMA), Dottie    . GERD (gastroesophageal reflux disease)   . H/O seasonal allergies   . Heart murmur   . Hiatal hernia   . 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 Hypertension   . Mitral valve prolapse   . Pacemaker     Past Surgical History:  Procedure  Laterality Date  . APPENDECTOMY    . BREAST SURGERY Right    biopsy  . COLONOSCOPY  10/19/2004   [facility]  . EYE SURGERY Left    cataract extraction with IOL  . LAPAROSCOPIC LOW ANTERIOR RESECTION  11/16/2013   Robotic  . LAPAROSCOPIC LYSIS OF ADHESIONS N/A 11/15/2013   Procedure: LAPAROSCOPIC LYSIS OF ADHESIONS;  Surgeon: Adin Hector, MD;  Location: [facility];  Service: General;  Laterality: N/A;  . left ovary cyst removal  1972  . [employer], 2004, 2012   device dependant  . POLYPECTOMY    . PROCTOSCOPY  11/15/2013   Procedure: RIGID PROCTOSCOPY;  Surgeon: Adin Hector, MD;  Location: [facility];  Service: General;;  . ROBOTIC ASSISTED SALPINGO OOPHERECTOMY Left 11/16/2013   Robotic en bloc w LAR resection  . TUBAL LIGATION    . UPPER GASTROINTESTINAL ENDOSCOPY      Family History  Problem Relation Age of Onset  . Diabetes Mother   . Stroke Mother   . Diabetes Father   . Lung cancer Father   . Lung cancer Maternal Grandfather   . Heart disease Maternal Aunt   . Heart disease Maternal Grandmother   . Breast cancer Maternal Aunt   . Colon cancer Neg Hx   . Esophageal cancer Neg Hx   . Pancreatic cancer Neg Hx   . Rectal cancer Neg Hx   . Stomach cancer Neg Hx     Social History   Socioeconomic History  . Marital status: Married    Spouse name: Not on file  . Number of children: 1  . Years of education: Not on file  . Highest education level: Not on file  Occupational History  . Occupation: REGISTRATION  Social Needs  . Financial resource strain: Not on file  . Food insecurity:    Worry: Not on file    Inability: Not on file  . Transportation needs:    Medical: Not on file    Non-medical: Not on file  Tobacco Use  . Smoking status: Never Smoker  . Smokeless tobacco: Never Used  Substance and Sexual Activity  . Alcohol use: No  . Drug use: No  . Sexual activity: Not on file  Lifestyle  . Physical activity:    Days per week: Not on file    Minutes  per session: Not on file  . Stress: Not on file  Relationships  . Social connections:    Talks on phone: Not on file    Gets together: Not on file    Attends religious service: Not on file    Active member of club or organization: Not on file    Attends meetings of clubs or organizations: Not on file    Relationship status: Not on file  . Intimate partner violence:    Fear of current  or ex partner: Not on file    Emotionally abused: Not on file    Physically abused: Not on file    Forced sexual activity: Not on file  Other Topics Concern  . Not on file  Social History Narrative   Lives with spouse in [place]   Works for an orthopoedic office in [place]            Review of systems: Review of Systems  Constitutional: Negative for fever and chills.  HENT: Positive for sinus problem and post nasal drip   Eyes: Negative for blurred vision.  Respiratory: Negative for cough, shortness of breath and wheezing.   Cardiovascular: Negative for chest pain and palpitations.  Gastrointestinal: as per HPI Genitourinary: Negative for dysuria, urgency, frequency and hematuria.  Musculoskeletal: Positive for myalgias, back pain and joint pain.  Skin: Negative for itching and rash.  Neurological: Negative for dizziness, tremors, focal weakness, seizures and loss of consciousness.  Endo/Heme/Allergies: Positive for seasonal allergies.  Psychiatric/Behavioral: Negative for depression, suicidal ideas and hallucinations.  All other systems reviewed and are negative.   Physical Exam: Vitals:   11/09/17 0828  BP: 138/80  Pulse: 72   Body mass index is 28.76 kg/m. Gen:      No acute distress HEENT:  EOMI, sclera anicteric Neck:     No masses; no thyromegaly Lungs:    Clear to auscultation bilaterally; normal respiratory effort CV:         Regular rate and rhythm; no murmurs Abd:      + bowel sounds; soft, non-tender; no palpable masses, no distension Ext:    No edema; adequate  peripheral perfusion Skin:      Warm and dry; no rash Neuro: alert and oriented x 3 Psych: normal mood and affect  Data Reviewed:  Reviewed labs, radiology imaging, old records and pertinent past GI work up   Assessment and Plan/Recommendations:  67 year old female with history of chronic GERD with persistent breakthrough symptoms Discussed antireflux measures and lifestyle modification in detail Switch to Nexium 40 mg daily, 30 minutes before breakfast Zantac at bedtime as needed Okay to use Gaviscon 1 to 2 tablets after meals up to 3 times daily as needed If continues to have persistent symptoms we will consider esophageal manometry and 24-hour pH impedance on PPI to further evaluate refractory GERD symptoms  Chronic constipation: Increase fluid intake and dietary fiber Start soluble fiber 2-3 times daily with meals She was given samples of tablets  Due for recall colonoscopy 2022 for colorectal cancer screening  Return in 6 months or sooner if needed  25 minutes was spent face-to-face with the patient. Greater than 50% of the time used for counseling as well as treatment plan and follow-up. She had multiple questions which were answered to her satisfaction  K. Denzil Magnuson , MD [phone]    CC: Terald Sleeper, PA-C

## 2017-11-10 ENCOUNTER — Ambulatory Visit: Payer: Medicare Other | Admitting: Physical Therapy

## 2017-11-10 DIAGNOSIS — M6281 Muscle weakness (generalized): Secondary | ICD-10-CM

## 2017-11-10 DIAGNOSIS — M25611 Stiffness of right shoulder, not elsewhere classified: Secondary | ICD-10-CM | POA: Diagnosis not present

## 2017-11-10 DIAGNOSIS — M25511 Pain in right shoulder: Secondary | ICD-10-CM | POA: Diagnosis not present

## 2017-11-10 NOTE — Therapy (Signed)
[facility] [place], [place], [address] Phone: [phone]   Fax:  [phone]  Physical Therapy Treatment  Patient Details  Name: Carmen Green MRN: [id number] Date of Birth: 09-18-1950 Referring Provider: Carole Civil.   Encounter Date: 11/10/2017  PT End of Session - 11/10/17 0910    Visit Number  7    Number of Visits  18    Date for PT Re-Evaluation  12/13/17    Authorization Type  FOTO every 5th visit, progress note every 10th visit; KX modifier after 15th visit.    PT Start Time  0900    PT Stop Time  0959    PT Time Calculation (min)  59 min    Activity Tolerance  Patient tolerated treatment well    Behavior During Therapy  WFL for tasks assessed/performed       Past Medical History:  Diagnosis Date  . Allergy   . Anemia   . Anxiety   . Arthritis   . Cataract    bil cataracts removed  . Colon polyp   . Complete heart block ([place])   . Contusion of chest wall 10/10/2007   Centricity Description: CHEST WALL CONTUSION Qualifier: Diagnosis of  By: Aline Brochure MD, Dorothyann Peng   Centricity Description: CONTUSION, RIGHT RIB Qualifier: Diagnosis of  By: Aline Brochure MD, Dorothyann Peng    . Depression   . Diverticulitis   . Esophagitis   . ESOPHAGITIS 06/25/2008   Qualifier: History of  By: Nelson-Smith CMA (AAMA), Dottie    . GERD (gastroesophageal reflux disease)   . H/O seasonal allergies   . Heart murmur   . Hiatal hernia   . 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 Hypertension   . Mitral valve prolapse   . Pacemaker     Past Surgical History:  Procedure Laterality Date  . APPENDECTOMY    . BREAST SURGERY Right    biopsy  . COLONOSCOPY  10/19/2004   LEC  . EYE SURGERY Left    cataract extraction with IOL  . LAPAROSCOPIC LOW ANTERIOR RESECTION  11/16/2013   Robotic  . LAPAROSCOPIC LYSIS OF ADHESIONS N/A 11/15/2013   Procedure:  LAPAROSCOPIC LYSIS OF ADHESIONS;  Surgeon: Adin Hector, MD;  Location: [facility];  Service: General;  Laterality: N/A;  . left ovary cyst removal  1972  . [place], 2004, 2012   device dependant  . POLYPECTOMY    . PROCTOSCOPY  11/15/2013   Procedure: RIGID PROCTOSCOPY;  Surgeon: Adin Hector, MD;  Location: [facility];  Service: General;;  . ROBOTIC ASSISTED SALPINGO OOPHERECTOMY Left 11/16/2013   Robotic en bloc w LAR resection  . TUBAL LIGATION    . UPPER GASTROINTESTINAL ENDOSCOPY      There were no vitals filed for this visit.  Subjective Assessment - 11/10/17 0906    Subjective  Patient reported feeling alright. Patient reported she was able to fix her hair and hold her blow dryer and clean kitchen floor with pain less than 5/10.    Patient is accompained by:  Family member    Pertinent History  right proximal humerus fx 09/24/17, PACEMAKER, HTN    Limitations  Lifting;House hold activities    Diagnostic tests  X-Ray: normal healing of fx.    Patient Stated Goals  get back to normal    Currently in Pain?  Yes did not provide pain  scale    Pain Orientation  Right    Pain Descriptors / Indicators  Discomfort    Pain Type  Chronic pain    Pain Onset  More than a month ago         [facility] PT Assessment - 11/10/17 0001      Assessment   Medical Diagnosis  Right closed non-displaced fracture of the proximal end of the humerus    Onset Date/Surgical Date  09/24/17    Hand Dominance  Right    Next MD Visit  11/30/2017    Prior Therapy  no                   [facility] Adult PT Treatment/Exercise - 11/10/17 0001      Shoulder Exercises: Supine   External Rotation  AROM;Right;20 reps    Flexion  AROM;5 reps;Right      Shoulder Exercises: Standing   Other Standing Exercises  wall ladder x10 to #23      Shoulder Exercises: Pulleys   Flexion  5 minutes      Shoulder Exercises: Isometric Strengthening   Flexion  5X5"    External Rotation  5X5"       Modalities   Modalities  Vasopneumatic      Vasopneumatic   Number Minutes Vasopneumatic   15 minutes    Vasopnuematic Location   Shoulder    Vasopneumatic Pressure  Low    Vasopneumatic Temperature   34      Manual Therapy   Manual Therapy  Passive ROM    Passive ROM  PROM of R shoulder into flexion, ER with holds at end range               PT Short Term Goals - 11/02/17 1031      PT SHORT TERM GOAL #1   Title  Patient will be independent with HEP    Time  3    Period  Weeks    Status  Achieved      PT SHORT TERM GOAL #2   Title  Patient will improve right shoulder AROM flexion to 90 + degrees with less than 2/10 pain.    Time  3    Period  Weeks    Status  Achieved      PT SHORT TERM GOAL #3   Title  Patient will decrease at worst pain to 5/10 or less in order to perform ADLS and functional activities.    Time  3    Period  Weeks    Status  On-going        PT Long Term Goals - 10/25/17 1217      PT LONG TERM GOAL #1   Title  Patient will improve right shoulder AROM flexion and abduction to 140 + degrees in order to perform functional activities and ADLs.     Time  6    Period  Weeks    Status  New      PT LONG TERM GOAL #2   Title  Patient will improve right shoulder ER to 60+ degrees in order to wash and style hair and don/doff apparel.     Time  6    Period  Weeks      PT LONG TERM GOAL #3   Title  Patient will improve R shoulder MMT in all planes to 4/5 or greater to improve ability to perform functional activities.    Time  6    Period  Weeks    Status  New      PT LONG TERM GOAL #4   Title  Patient will report ability to drive with no reports of pain with shoulder motion.    Time  6    Period  Weeks    Status  New      PT LONG TERM GOAL #5   Title  Patient will improve right grip strength to equal left.     Time  6    Period  Weeks    Status  New            Plan - 11/10/17 0947    Clinical Impression Statement  Patient was  able to tolerate progression of treatment well. Patient noted with smooth arc of motion during flexion PROM despite reports of pain at end range. PT and patient discussed the addition of low level strengthening next week. Patient reported agreement and understanding. Normal response to modalities upon removal.    Clinical Presentation  Stable    Clinical Decision Making  Low    Rehab Potential  Good    PT Frequency  3x / week    PT Duration  6 weeks    PT Treatment/Interventions  ADLs/Self Care Home Management;Cryotherapy;Moist Heat;Vasopneumatic Device;Taping;Dry needling;Passive range of motion;Manual techniques;Patient/family education;Therapeutic exercise;Therapeutic activities;Ultrasound;Iontophoresis 4mg /ml Dexamethasone    PT Next Visit Plan  Assess goals; Add AROM and strengthening exrecises, continue AAROM exercises;and modalities PRN. VASO ONLY    Consulted and Agree with Plan of Care  Patient       Patient will benefit from skilled therapeutic intervention in order to improve the following deficits and impairments:  Pain, Decreased activity tolerance, Decreased range of motion, Decreased strength, Impaired UE functional use  Visit Diagnosis: Right shoulder pain, unspecified chronicity  Stiffness of right shoulder, not elsewhere classified  Muscle weakness (generalized)     Problem List Patient Active Problem List   Diagnosis Date Noted  . Closed fracture of proximal end of right humerus 09/24/17 10/17/2017  . Allergic rhinitis due to pollen 01/26/2017  . Body mass index 29.0-29.9, adult 03/02/2016  . Palpitation 07/13/2015  . Ovarian abscess due to diverticulitis s/p LSO 11/15/2013 11/16/2013  . 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 CHEST PAIN-UNSPECIFIED 07/03/2010  . CARDIAC MURMUR 05/22/2010  . Anxiety state 11/07/2008  . Complete heart block ([place]) 11/07/2008  . COLONIC POLYPS, HX OF 06/28/2008  . Hyperlipidemia 06/25/2008  .  Essential hypertension 06/25/2008  . Mitral valve disorder 06/25/2008  . Cardiomyopathy ([place]) 06/25/2008  . GERD 06/25/2008  . HIATAL HERNIA 06/25/2008    Gabriela Eves, PT, DPT 11/10/2017, 11:24 AM  [facility] [address] [place], [place], [address] Phone: [phone]   Fax:  [phone]  Name: Carmen Green MRN: [id number] Date of Birth: 10/31/1950

## 2017-11-15 ENCOUNTER — Ambulatory Visit: Payer: Medicare Other | Admitting: Physical Therapy

## 2017-11-15 ENCOUNTER — Encounter: Payer: Self-pay | Admitting: Physical Therapy

## 2017-11-15 DIAGNOSIS — M25511 Pain in right shoulder: Secondary | ICD-10-CM | POA: Diagnosis not present

## 2017-11-15 DIAGNOSIS — M6281 Muscle weakness (generalized): Secondary | ICD-10-CM | POA: Diagnosis not present

## 2017-11-15 DIAGNOSIS — M25611 Stiffness of right shoulder, not elsewhere classified: Secondary | ICD-10-CM | POA: Diagnosis not present

## 2017-11-15 NOTE — Therapy (Signed)
[facility] [place], [place], [address] Phone: [phone]   Fax:  [phone]  Physical Therapy Treatment  Patient Details  Name: Carmen Green MRN: [id number] Date of Birth: 1951/01/16 Referring Provider: Carole Civil.   Encounter Date: 11/15/2017  PT End of Session - 11/15/17 0913    Visit Number  8    Number of Visits  18    Date for PT Re-Evaluation  12/13/17    Authorization Type  FOTO every 5th visit, progress note every 10th visit; KX modifier after 15th visit.    PT Start Time  0900    PT Stop Time  0953    PT Time Calculation (min)  53 min    Activity Tolerance  Patient tolerated treatment well    Behavior During Therapy  WFL for tasks assessed/performed       Past Medical History:  Diagnosis Date  . Allergy   . Anemia   . Anxiety   . Arthritis   . Cataract    bil cataracts removed  . Colon polyp   . Complete heart block ([place])   . Contusion of chest wall 10/10/2007   Centricity Description: CHEST WALL CONTUSION Qualifier: Diagnosis of  By: Aline Brochure MD, Dorothyann Peng   Centricity Description: CONTUSION, RIGHT RIB Qualifier: Diagnosis of  By: Aline Brochure MD, Dorothyann Peng    . Depression   . Diverticulitis   . Esophagitis   . ESOPHAGITIS 06/25/2008   Qualifier: History of  By: Nelson-Smith CMA (AAMA), Dottie    . GERD (gastroesophageal reflux disease)   . H/O seasonal allergies   . Heart murmur   . Hiatal hernia   . 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 Hypertension   . Mitral valve prolapse   . Pacemaker     Past Surgical History:  Procedure Laterality Date  . APPENDECTOMY    . BREAST SURGERY Right    biopsy  . COLONOSCOPY  10/19/2004   LEC  . EYE SURGERY Left    cataract extraction with IOL  . LAPAROSCOPIC LOW ANTERIOR RESECTION  11/16/2013   Robotic  . LAPAROSCOPIC LYSIS OF ADHESIONS N/A 11/15/2013   Procedure:  LAPAROSCOPIC LYSIS OF ADHESIONS;  Surgeon: Adin Hector, MD;  Location: [facility];  Service: General;  Laterality: N/A;  . left ovary cyst removal  1972  . [place], 2004, 2012   device dependant  . POLYPECTOMY    . PROCTOSCOPY  11/15/2013   Procedure: RIGID PROCTOSCOPY;  Surgeon: Adin Hector, MD;  Location: [facility];  Service: General;;  . ROBOTIC ASSISTED SALPINGO OOPHERECTOMY Left 11/16/2013   Robotic en bloc w LAR resection  . TUBAL LIGATION    . UPPER GASTROINTESTINAL ENDOSCOPY      There were no vitals filed for this visit.  Subjective Assessment - 11/15/17 0912    Subjective  Patient reports feeling "alright". She reports soreness on the lateral aspect of her right arm today 3/10    Patient is accompained by:  Family member    Pertinent History  right proximal humerus fx 09/24/17, PACEMAKER, HTN    Limitations  Lifting;House hold activities    Diagnostic tests  X-Ray: normal healing of fx.    Patient Stated Goals  get back to normal    Currently in Pain?  Yes    Pain Score  3     Pain Location  Arm    Pain Orientation  Mid;Right    Pain Descriptors / Indicators  Sore    Pain Type  Chronic pain    Pain Onset  More than a month ago         [facility] PT Assessment - 11/15/17 0001      Assessment   Medical Diagnosis  Right closed non-displaced fracture of the proximal end of the humerus    Onset Date/Surgical Date  09/24/17    Hand Dominance  Right    Next MD Visit  11/28/2017    Prior Therapy  no                   [facility] Adult PT Treatment/Exercise - 11/15/17 0001      Exercises   Exercises  Shoulder      Shoulder Exercises: Standing   External Rotation  AROM;Right;15 reps    Flexion  AROM;Right;15 reps    Other Standing Exercises  scaption x15      Shoulder Exercises: Pulleys   Flexion  5 minutes      Shoulder Exercises: ROM/Strengthening   UBE (Upper Arm Bike)  120 RPM, 6 minutes (3 fwd, 3 bwd)      Modalities   Modalities   Vasopneumatic      Vasopneumatic   Number Minutes Vasopneumatic   15 minutes    Vasopnuematic Location   Shoulder    Vasopneumatic Pressure  Low    Vasopneumatic Temperature   34      Manual Therapy   Manual Therapy  Passive ROM;Soft tissue mobilization    Soft tissue mobilization  STW/M to deltoid insertion to decrease pain and decrease tone    Passive ROM  PROM of R shoulder into flexion, ER with holds at end range               PT Short Term Goals - 11/02/17 1031      PT SHORT TERM GOAL #1   Title  Patient will be independent with HEP    Time  3    Period  Weeks    Status  Achieved      PT SHORT TERM GOAL #2   Title  Patient will improve right shoulder AROM flexion to 90 + degrees with less than 2/10 pain.    Time  3    Period  Weeks    Status  Achieved      PT SHORT TERM GOAL #3   Title  Patient will decrease at worst pain to 5/10 or less in order to perform ADLS and functional activities.    Time  3    Period  Weeks    Status  On-going        PT Long Term Goals - 11/15/17 0947      PT LONG TERM GOAL #1   Title  Patient will improve right shoulder AROM flexion and abduction to 140 + degrees in order to perform functional activities and ADLs.     Time  6    Period  Weeks    Status  On-going      PT LONG TERM GOAL #2   Title  Patient will improve right shoulder ER to 60+ degrees in order to wash and style hair and don/doff apparel.     Time  6    Period  Weeks    Status  On-going      PT LONG TERM GOAL #3   Title  Patient will  improve R shoulder MMT in all planes to 4/5 or greater to improve ability to perform functional activities.    Time  6    Period  Weeks    Status  On-going      PT LONG TERM GOAL #4   Title  Patient will report ability to drive with no reports of pain with shoulder motion.    Period  Weeks    Status  On-going has not started driving yet      PT LONG TERM GOAL #5   Title  Patient will improve right grip strength to equal  left.     Time  6    Period  Weeks    Status  On-going            Plan - 11/15/17 0945    Clinical Impression Statement  Patient was able to tolerate treatment well with minimal reports of discomfort with PROM at end range. Patient reported decrease of soreness at end of STW/M. Patient demo improved shoulder flexion AROM. Normal response to modalities at end of session.    Clinical Presentation  Stable    Clinical Decision Making  Low    Rehab Potential  Good    PT Frequency  3x / week    PT Duration  6 weeks    PT Treatment/Interventions  ADLs/Self Care Home Management;Cryotherapy;Moist Heat;Vasopneumatic Device;Taping;Dry needling;Passive range of motion;Manual techniques;Patient/family education;Therapeutic exercise;Therapeutic activities;Ultrasound;Iontophoresis 4mg /ml Dexamethasone    PT Next Visit Plan  Add AROM and strengthening exrecises, continue AAROM exercises;and modalities PRN. VASO ONLY    Consulted and Agree with Plan of Care  Patient       Patient will benefit from skilled therapeutic intervention in order to improve the following deficits and impairments:  Pain, Decreased activity tolerance, Decreased range of motion, Decreased strength, Impaired UE functional use  Visit Diagnosis: Right shoulder pain, unspecified chronicity  Stiffness of right shoulder, not elsewhere classified  Muscle weakness (generalized)     Problem List Patient Active Problem List   Diagnosis Date Noted  . Closed fracture of proximal end of right humerus 09/24/17 10/17/2017  . Allergic rhinitis due to pollen 01/26/2017  . Body mass index 29.0-29.9, adult 03/02/2016  . Palpitation 07/13/2015  . Ovarian abscess due to diverticulitis s/p LSO 11/15/2013 11/16/2013  . 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 CHEST PAIN-UNSPECIFIED 07/03/2010  . CARDIAC MURMUR 05/22/2010  . Anxiety state 11/07/2008  . Complete heart block ([place]) 11/07/2008  . COLONIC  POLYPS, HX OF 06/28/2008  . Hyperlipidemia 06/25/2008  . Essential hypertension 06/25/2008  . Mitral valve disorder 06/25/2008  . Cardiomyopathy ([place]) 06/25/2008  . GERD 06/25/2008  . HIATAL HERNIA 06/25/2008    Gabriela Eves, PT, DPT 11/15/2017, 9:57 AM  [facility] [address] [place], [place], [address] Phone: [phone]   Fax:  [phone]  Name: Carmen Green MRN: [id number] Date of Birth: 1950-09-13

## 2017-11-17 ENCOUNTER — Encounter: Payer: Self-pay | Admitting: Gastroenterology

## 2017-11-17 ENCOUNTER — Ambulatory Visit: Payer: Medicare Other | Admitting: Physical Therapy

## 2017-11-17 DIAGNOSIS — M25611 Stiffness of right shoulder, not elsewhere classified: Secondary | ICD-10-CM

## 2017-11-17 DIAGNOSIS — M6281 Muscle weakness (generalized): Secondary | ICD-10-CM | POA: Diagnosis not present

## 2017-11-17 DIAGNOSIS — M25511 Pain in right shoulder: Secondary | ICD-10-CM | POA: Diagnosis not present

## 2017-11-17 NOTE — Therapy (Signed)
[facility] [place], [place], [address] Phone: [phone]   Fax:  [phone]  Physical Therapy Treatment  Patient Details  Name: Carmen Green MRN: [id number] Date of Birth: 1950/11/06 Referring Provider: Carole Civil.   Encounter Date: 11/17/2017  PT End of Session - 11/17/17 0943    Visit Number  9    Number of Visits  18    Date for PT Re-Evaluation  12/13/17    Authorization Type  FOTO every 5th visit, progress note every 10th visit; KX modifier after 15th visit.    PT Start Time  0900    PT Stop Time  0957    PT Time Calculation (min)  57 min    Activity Tolerance  Patient tolerated treatment well    Behavior During Therapy  WFL for tasks assessed/performed       Past Medical History:  Diagnosis Date  . Allergy   . Anemia   . Anxiety   . Arthritis   . Cataract    bil cataracts removed  . Colon polyp   . Complete heart block ([place])   . Contusion of chest wall 10/10/2007   Centricity Description: CHEST WALL CONTUSION Qualifier: Diagnosis of  By: Aline Brochure MD, Dorothyann Peng   Centricity Description: CONTUSION, RIGHT RIB Qualifier: Diagnosis of  By: Aline Brochure MD, Dorothyann Peng    . Depression   . Diverticulitis   . Esophagitis   . ESOPHAGITIS 06/25/2008   Qualifier: History of  By: Nelson-Smith CMA (AAMA), Dottie    . GERD (gastroesophageal reflux disease)   . H/O seasonal allergies   . Heart murmur   . Hiatal hernia   . 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 Hypertension   . Mitral valve prolapse   . Pacemaker     Past Surgical History:  Procedure Laterality Date  . APPENDECTOMY    . BREAST SURGERY Right    biopsy  . COLONOSCOPY  10/19/2004   LEC  . EYE SURGERY Left    cataract extraction with IOL  . LAPAROSCOPIC LOW ANTERIOR RESECTION  11/16/2013   Robotic  . LAPAROSCOPIC LYSIS OF ADHESIONS N/A 11/15/2013   Procedure:  LAPAROSCOPIC LYSIS OF ADHESIONS;  Surgeon: Adin Hector, MD;  Location: [facility];  Service: General;  Laterality: N/A;  . left ovary cyst removal  1972  . [place], 2004, 2012   device dependant  . POLYPECTOMY    . PROCTOSCOPY  11/15/2013   Procedure: RIGID PROCTOSCOPY;  Surgeon: Adin Hector, MD;  Location: [facility];  Service: General;;  . ROBOTIC ASSISTED SALPINGO OOPHERECTOMY Left 11/16/2013   Robotic en bloc w LAR resection  . TUBAL LIGATION    . UPPER GASTROINTESTINAL ENDOSCOPY      There were no vitals filed for this visit.  Subjective Assessment - 11/17/17 1107    Subjective  Patient reports feeling "just okay." She reports she has pain/discomfort with ironing.    Pertinent History  right proximal humerus fx 09/24/17, PACEMAKER, HTN    Limitations  Lifting;House hold activities    Diagnostic tests  X-Ray: normal healing of fx.    Patient Stated Goals  get back to normal    Currently in Pain?  Yes    Pain Score  3     Pain Orientation  Mid;Right    Pain Descriptors / Indicators  Sore    Pain Type  Chronic pain    Pain Onset  More than a month ago         [facility] PT Assessment - 11/17/17 0001      Assessment   Medical Diagnosis  Right closed non-displaced fracture of the proximal end of the humerus    Onset Date/Surgical Date  09/24/17    Hand Dominance  Right    Next MD Visit  11/28/2017    Prior Therapy  no      AROM   Right Shoulder Flexion  135 Degrees    Right Shoulder External Rotation  53 Degrees in 45 degrees abd                   [facility] Adult PT Treatment/Exercise - 11/17/17 0001      Exercises   Exercises  Shoulder      Shoulder Exercises: Sidelying   External Rotation  AROM;Right;20 reps    Flexion  AROM;Right;20 reps      Shoulder Exercises: Standing   External Rotation  AROM;Right;20 reps    Flexion  AROM;Right;20 reps    Other Standing Exercises  scaption x20    Other Standing Exercises  UE ranger, flexion, cw, ccw  circles 1 minute each      Shoulder Exercises: Pulleys   Flexion  5 minutes      Shoulder Exercises: ROM/Strengthening   UBE (Upper Arm Bike)  120 RPM, 6 minutes (3 fwd, 3 bwd)      Modalities   Modalities  Vasopneumatic      Vasopneumatic   Number Minutes Vasopneumatic   15 minutes    Vasopnuematic Location   Shoulder    Vasopneumatic Pressure  Low    Vasopneumatic Temperature   34      Manual Therapy   Manual Therapy  Passive ROM;Soft tissue mobilization    Passive ROM  PROM of R shoulder into flexion, ER with holds at end range               PT Short Term Goals - 11/17/17 0947      PT SHORT TERM GOAL #1   Title  Patient will be independent with HEP    Time  3    Period  Weeks    Status  Achieved      PT SHORT TERM GOAL #2   Title  Patient will improve right shoulder AROM flexion to 90 + degrees with less than 2/10 pain.    Time  3    Period  Weeks    Status  Achieved      PT SHORT TERM GOAL #3   Title  Patient will decrease at worst pain to 5/10 or less in order to perform ADLS and functional activities.    Time  3    Period  Weeks    Status  On-going 6/10 after ironing        PT Long Term Goals - 11/15/17 0947      PT LONG TERM GOAL #1   Title  Patient will improve right shoulder AROM flexion and abduction to 140 + degrees in order to perform functional activities and ADLs.     Time  6    Period  Weeks    Status  On-going      PT LONG TERM GOAL #2   Title  Patient will improve right shoulder ER to 60+ degrees in order to wash and style hair and don/doff apparel.     Time  6    Period  Weeks    Status  On-going      PT LONG TERM GOAL #3   Title  Patient will improve R shoulder MMT in all planes to 4/5 or greater to improve ability to perform functional activities.    Time  6    Period  Weeks    Status  On-going      PT LONG TERM GOAL #4   Title  Patient will report ability to drive with no reports of pain with shoulder motion.    Period   Weeks    Status  On-going has not started driving yet      PT LONG TERM GOAL #5   Title  Patient will improve right grip strength to equal left.     Time  6    Period  Weeks    Status  On-going            Plan - 11/17/17 1053    Clinical Impression Statement  Patient was able to tolerate progression of treatment well. Patient required verbal cuing to prevent throacic extension compensation during shoulder scaption exercises. Patient instructed to perform high quality repetitions with rests rather than compensate to increase ROM. Patient reported agreement. Patient noted with improve external rotation AROM. PT and patient discussed doing light AROM exercises in the pool. Patient reported understanding. Normal response to modalities upon removal.    Clinical Presentation  Stable    Clinical Decision Making  Low    Rehab Potential  Good    PT Frequency  3x / week    PT Duration  6 weeks    PT Treatment/Interventions  ADLs/Self Care Home Management;Cryotherapy;Moist Heat;Vasopneumatic Device;Taping;Dry needling;Passive range of motion;Manual techniques;Patient/family education;Therapeutic exercise;Therapeutic activities;Ultrasound;Iontophoresis 4mg /ml Dexamethasone    PT Next Visit Plan  FOTO and progress note next visit.Add AROM and strengthening exrecises, continue AAROM exercises;and modalities PRN. VASO ONLY    Consulted and Agree with Plan of Care  Patient       Patient will benefit from skilled therapeutic intervention in order to improve the following deficits and impairments:  Pain, Decreased activity tolerance, Decreased range of motion, Decreased strength, Impaired UE functional use  Visit Diagnosis: Right shoulder pain, unspecified chronicity  Stiffness of right shoulder, not elsewhere classified  Muscle weakness (generalized)     Problem List Patient Active Problem List   Diagnosis Date Noted  . Closed fracture of proximal end of right humerus 09/24/17 10/17/2017  .  Allergic rhinitis due to pollen 01/26/2017  . Body mass index 29.0-29.9, adult 03/02/2016  . Palpitation 07/13/2015  . Ovarian abscess due to diverticulitis s/p LSO 11/15/2013 11/16/2013  . 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 CHEST PAIN-UNSPECIFIED 07/03/2010  . CARDIAC MURMUR 05/22/2010  . Anxiety state 11/07/2008  . Complete heart block ([place]) 11/07/2008  . COLONIC POLYPS, HX OF 06/28/2008  . Hyperlipidemia 06/25/2008  . Essential hypertension 06/25/2008  . Mitral valve disorder 06/25/2008  . Cardiomyopathy ([place]) 06/25/2008  . GERD 06/25/2008  . HIATAL HERNIA 06/25/2008    Gabriela Eves, PT, DPT 11/17/2017, 11:17 AM  [facility] [address] [place], [place], [address] Phone: [phone]   Fax:  [phone]  Name: KENESHIA TENA MRN: [id number] Date of Birth: Feb 06, 1951

## 2017-11-22 ENCOUNTER — Other Ambulatory Visit: Payer: Self-pay | Admitting: Orthopedic Surgery

## 2017-11-22 ENCOUNTER — Ambulatory Visit: Payer: Medicare Other | Attending: Orthopedic Surgery | Admitting: Physical Therapy

## 2017-11-22 DIAGNOSIS — M25611 Stiffness of right shoulder, not elsewhere classified: Secondary | ICD-10-CM | POA: Diagnosis not present

## 2017-11-22 DIAGNOSIS — M6281 Muscle weakness (generalized): Secondary | ICD-10-CM | POA: Diagnosis not present

## 2017-11-22 DIAGNOSIS — M25511 Pain in right shoulder: Secondary | ICD-10-CM | POA: Diagnosis not present

## 2017-11-22 NOTE — Telephone Encounter (Signed)
Patient requests refill on Hydrocodone/Acetaminophen 5-325  Mgs.   Qty  30  Sig: Take 1 tablet by mouth every 6 (six) hours as needed.  Patient states she uses [employer]

## 2017-11-22 NOTE — Therapy (Signed)
[facility] [place], [place], [address] Phone: [phone]   Fax:  [phone]  Physical Therapy Treatment  Patient Details  Name: Carmen Green MRN: [id number] Date of Birth: [birth year] Referring Provider: Carole Civil.   Encounter Date: 11/22/2017  PT End of Session - 11/22/17 1028    Visit Number  10    Number of Visits  18    Date for PT Re-Evaluation  12/13/17    Authorization Type  FOTO every 5th visit, progress note every 10th visit; KX modifier after 15th visit.    PT Start Time  0900    PT Stop Time  [phone]    PT Time Calculation (min)  52 min    Activity Tolerance  Patient tolerated treatment well    Behavior During Therapy  WFL for tasks assessed/performed       Past Medical History:  Diagnosis Date  . Allergy   . Anemia   . Anxiety   . Arthritis   . Cataract    bil cataracts removed  . Colon polyp   . Complete heart block ([place])   . Contusion of chest wall 10/10/2007   Centricity Description: CHEST WALL CONTUSION Qualifier: Diagnosis of  By: Aline Brochure MD, Dorothyann Peng   Centricity Description: CONTUSION, RIGHT RIB Qualifier: Diagnosis of  By: Aline Brochure MD, Dorothyann Peng    . Depression   . Diverticulitis   . Esophagitis   . ESOPHAGITIS 06/25/2008   Qualifier: History of  By: Nelson-Smith CMA (AAMA), Dottie    . GERD (gastroesophageal reflux disease)   . H/O seasonal allergies   . Heart murmur   . Hiatal hernia   . 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 Hypertension   . Mitral valve prolapse   . Pacemaker     Past Surgical History:  Procedure Laterality Date  . APPENDECTOMY    . BREAST SURGERY Right    biopsy  . COLONOSCOPY  10/19/2004   LEC  . EYE SURGERY Left    cataract extraction with IOL  . LAPAROSCOPIC LOW ANTERIOR RESECTION  11/16/2013   Robotic  . LAPAROSCOPIC LYSIS OF ADHESIONS N/A 11/15/2013   Procedure:  LAPAROSCOPIC LYSIS OF ADHESIONS;  Surgeon: Adin Hector, MD;  Location: [facility];  Service: General;  Laterality: N/A;  . left ovary cyst removal  1972  . [place], 2004, 2012   device dependant  . POLYPECTOMY    . PROCTOSCOPY  11/15/2013   Procedure: RIGID PROCTOSCOPY;  Surgeon: Adin Hector, MD;  Location: [facility];  Service: General;;  . ROBOTIC ASSISTED SALPINGO OOPHERECTOMY Left 11/16/2013   Robotic en bloc w LAR resection  . TUBAL LIGATION    . UPPER GASTROINTESTINAL ENDOSCOPY      There were no vitals filed for this visit.  Subjective Assessment - 11/22/17 1048    Subjective  Patient reports feeling good. She was able to get into the pool and work her shoulder with no soreness after.    Patient is accompained by:  Family member    Pertinent History  right proximal humerus fx 09/24/17, PACEMAKER, HTN    Limitations  Lifting;House hold activities    Diagnostic tests  X-Ray: normal healing of fx.    Patient Stated Goals  get back to normal    Currently in Pain?  Yes    Pain Score  2  Progress Note Reporting Period 10/25/17 to 11/22/17  See note below for Objective Data and Assessment of Progress/Goals.                    [facility] Adult PT Treatment/Exercise - 11/22/17 0001      Exercises   Exercises  Shoulder      Shoulder Exercises: Sidelying   External Rotation  AROM;Right;20 reps    Flexion  AROM;Right;20 reps      Shoulder Exercises: Standing   External Rotation  Right;20 reps;Theraband;Strengthening    Theraband Level (Shoulder External Rotation)  Level 1 (Yellow)    Internal Rotation  Strengthening;Left;20 reps;Theraband    Theraband Level (Shoulder Internal Rotation)  Level 1 (Yellow)    Flexion  AROM;Right;20 reps    ABduction  AROM;Right;20 reps    Retraction  Strengthening;Right;20 reps;Theraband    Theraband Level (Shoulder Retraction)  Level 1 (Yellow)      Shoulder Exercises: ROM/Strengthening   UBE (Upper Arm Bike)   120 RPM, 6 minutes (3 fwd, 3 bwd)      Modalities   Modalities  Vasopneumatic      Vasopneumatic   Number Minutes Vasopneumatic   15 minutes    Vasopnuematic Location   Shoulder    Vasopneumatic Pressure  Low    Vasopneumatic Temperature   34      Manual Therapy   Manual Therapy  Passive ROM;Soft tissue mobilization    Passive ROM  PROM of R shoulder into flexion, ER with holds at end range               PT Short Term Goals - 11/17/17 0947      PT SHORT TERM GOAL #1   Title  Patient will be independent with HEP    Time  3    Period  Weeks    Status  Achieved      PT SHORT TERM GOAL #2   Title  Patient will improve right shoulder AROM flexion to 90 + degrees with less than 2/10 pain.    Time  3    Period  Weeks    Status  Achieved      PT SHORT TERM GOAL #3   Title  Patient will decrease at worst pain to 5/10 or less in order to perform ADLS and functional activities.    Time  3    Period  Weeks    Status  On-going 6/10 after ironing        PT Long Term Goals - 11/15/17 0947      PT LONG TERM GOAL #1   Title  Patient will improve right shoulder AROM flexion and abduction to 140 + degrees in order to perform functional activities and ADLs.     Time  6    Period  Weeks    Status  On-going      PT LONG TERM GOAL #2   Title  Patient will improve right shoulder ER to 60+ degrees in order to wash and style hair and don/doff apparel.     Time  6    Period  Weeks    Status  On-going      PT LONG TERM GOAL #3   Title  Patient will improve R shoulder MMT in all planes to 4/5 or greater to improve ability to perform functional activities.    Time  6    Period  Weeks    Status  On-going  PT LONG TERM GOAL #4   Title  Patient will report ability to drive with no reports of pain with shoulder motion.    Period  Weeks    Status  On-going has not started driving yet      PT LONG TERM GOAL #5   Title  Patient will improve right grip strength to equal left.      Time  6    Period  Weeks    Status  On-going            Plan - 11/22/17 1029    Clinical Impression Statement  Patient was able to tolerate progression of treatment well. Patient noted with some muscle fatigue and soreness but reported it was "tolerable." Patient demonstrates strong carry over of cuing as noted by the ability to perfrom proper form with exercises. Normal response to modalities upon removal     Clinical Presentation  Stable    Clinical Decision Making  Low    Rehab Potential  Good    PT Frequency  3x / week    PT Duration  6 weeks    PT Treatment/Interventions  ADLs/Self Care Home Management;Cryotherapy;Moist Heat;Vasopneumatic Device;Taping;Dry needling;Passive range of motion;Manual techniques;Patient/family education;Therapeutic exercise;Therapeutic activities;Ultrasound;Iontophoresis 4mg /ml Dexamethasone    PT Next Visit Plan  FOTO Cont AROM and strengthening exrecises, continue AAROM exercises for flexion ;and modalities PRN. VASO ONLY    Consulted and Agree with Plan of Care  Patient       Patient will benefit from skilled therapeutic intervention in order to improve the following deficits and impairments:  Pain, Decreased activity tolerance, Decreased range of motion, Decreased strength, Impaired UE functional use  Visit Diagnosis: Right shoulder pain, unspecified chronicity  Stiffness of right shoulder, not elsewhere classified  Muscle weakness (generalized)     Problem List Patient Active Problem List   Diagnosis Date Noted  . Closed fracture of proximal end of right humerus 09/24/17 10/17/2017  . Allergic rhinitis due to pollen 01/26/2017  . Body mass index 29.0-29.9, adult 03/02/2016  . Palpitation 07/13/2015  . Ovarian abscess due to diverticulitis s/p LSO 11/15/2013 11/16/2013  . 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 CHEST PAIN-UNSPECIFIED 07/03/2010  . CARDIAC MURMUR 05/22/2010  . Anxiety state  11/07/2008  . Complete heart block ([place]) 11/07/2008  . COLONIC POLYPS, HX OF 06/28/2008  . Hyperlipidemia 06/25/2008  . Essential hypertension 06/25/2008  . Mitral valve disorder 06/25/2008  . Cardiomyopathy ([place]) 06/25/2008  . GERD 06/25/2008  . HIATAL HERNIA 06/25/2008   Gabriela Eves, PT, DPT 11/22/2017, 11:10 AM  [facility] [address] [place], [place], [address] Phone: [phone]   Fax:  [phone]  Name: SHUNNA MIKAELIAN MRN: [id number] Date of Birth: [birth year]

## 2017-11-23 MED ORDER — HYDROCODONE-ACETAMINOPHEN 5-325 MG PO TABS: 1.0000 | ORAL_TABLET | Freq: Three times a day (TID) | ORAL | 0 refills | Status: DC | PRN

## 2017-11-24 ENCOUNTER — Ambulatory Visit: Payer: Medicare Other | Admitting: Physical Therapy

## 2017-11-24 DIAGNOSIS — M25611 Stiffness of right shoulder, not elsewhere classified: Secondary | ICD-10-CM

## 2017-11-24 DIAGNOSIS — M6281 Muscle weakness (generalized): Secondary | ICD-10-CM

## 2017-11-24 DIAGNOSIS — M25511 Pain in right shoulder: Secondary | ICD-10-CM

## 2017-11-24 NOTE — Therapy (Signed)
[facility] [place], [place], [address] Phone: [phone]   Fax:  [phone]  Physical Therapy Treatment  Patient Details  Name: Carmen Green MRN: [id number] Date of Birth: [birth year] Referring Provider: Carole Civil.   Encounter Date: 11/24/2017  PT End of Session - 11/24/17 0904    Visit Number  11    Number of Visits  18    Date for PT Re-Evaluation  12/13/17    Authorization Type  FOTO every 5th visit, progress note every 10th visit; KX modifier after 15th visit.    PT Start Time  0900    PT Stop Time  0953    PT Time Calculation (min)  53 min    Activity Tolerance  Patient tolerated treatment well    Behavior During Therapy  WFL for tasks assessed/performed       Past Medical History:  Diagnosis Date  . Allergy   . Anemia   . Anxiety   . Arthritis   . Cataract    bil cataracts removed  . Colon polyp   . Complete heart block ([place])   . Contusion of chest wall 10/10/2007   Centricity Description: CHEST WALL CONTUSION Qualifier: Diagnosis of  By: Aline Brochure MD, Dorothyann Peng   Centricity Description: CONTUSION, RIGHT RIB Qualifier: Diagnosis of  By: Aline Brochure MD, Dorothyann Peng    . Depression   . Diverticulitis   . Esophagitis   . ESOPHAGITIS 06/25/2008   Qualifier: History of  By: Nelson-Smith CMA (AAMA), Dottie    . GERD (gastroesophageal reflux disease)   . H/O seasonal allergies   . Heart murmur   . Hiatal hernia   . 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 Hypertension   . Mitral valve prolapse   . Pacemaker     Past Surgical History:  Procedure Laterality Date  . APPENDECTOMY    . BREAST SURGERY Right    biopsy  . COLONOSCOPY  10/19/2004   LEC  . EYE SURGERY Left    cataract extraction with IOL  . LAPAROSCOPIC LOW ANTERIOR RESECTION  11/16/2013   Robotic  . LAPAROSCOPIC LYSIS OF ADHESIONS N/A 11/15/2013   Procedure:  LAPAROSCOPIC LYSIS OF ADHESIONS;  Surgeon: Adin Hector, MD;  Location: [facility];  Service: General;  Laterality: N/A;  . left ovary cyst removal  1972  . [place], 2004, 2012   device dependant  . POLYPECTOMY    . PROCTOSCOPY  11/15/2013   Procedure: RIGID PROCTOSCOPY;  Surgeon: Adin Hector, MD;  Location: [facility];  Service: General;;  . ROBOTIC ASSISTED SALPINGO OOPHERECTOMY Left 11/16/2013   Robotic en bloc w LAR resection  . TUBAL LIGATION    . UPPER GASTROINTESTINAL ENDOSCOPY      There were no vitals filed for this visit.  Subjective Assessment - 11/24/17 0904    Subjective  Patient reported no pain.    Pertinent History  right proximal humerus fx 09/24/17, PACEMAKER, HTN    Limitations  Lifting;House hold activities    Diagnostic tests  X-Ray: normal healing of fx.    Patient Stated Goals  get back to normal    Currently in Pain?  No/denies         [facility] PT Assessment - 11/24/17 0001      Assessment   Medical Diagnosis  Right closed non-displaced fracture of the proximal end of the humerus  Onset Date/Surgical Date  09/24/17    Hand Dominance  Right    Next MD Visit  11/28/2017    Prior Therapy  no      Observation/Other Assessments   Focus on Therapeutic Outcomes (FOTO)   46% limited                   [facility] Adult PT Treatment/Exercise - 11/24/17 0001      Exercises   Exercises  Shoulder      Shoulder Exercises: Standing   External Rotation  Right;20 reps;Theraband;Strengthening    Theraband Level (Shoulder External Rotation)  Level 1 (Yellow)    Internal Rotation  Strengthening;Left;20 reps;Theraband    Theraband Level (Shoulder Internal Rotation)  Level 1 (Yellow)    Flexion  AAROM;Right;20 reps    ABduction  AROM;Right;20 reps    Other Standing Exercises  behind the back IR with cane x20      Shoulder Exercises: Pulleys   Flexion  5 minutes      Shoulder Exercises: ROM/Strengthening   UBE (Upper Arm Bike)  120 RPM, 6  minutes (3 fwd, 3 bwd)      Modalities   Modalities  Vasopneumatic      Vasopneumatic   Number Minutes Vasopneumatic   15 minutes    Vasopnuematic Location   Shoulder    Vasopneumatic Pressure  Low    Vasopneumatic Temperature   34      Manual Therapy   Manual Therapy  Passive ROM    Passive ROM  PROM of R shoulder into flexion, ER with holds at end range               PT Short Term Goals - 11/17/17 0947      PT SHORT TERM GOAL #1   Title  Patient will be independent with HEP    Time  3    Period  Weeks    Status  Achieved      PT SHORT TERM GOAL #2   Title  Patient will improve right shoulder AROM flexion to 90 + degrees with less than 2/10 pain.    Time  3    Period  Weeks    Status  Achieved      PT SHORT TERM GOAL #3   Title  Patient will decrease at worst pain to 5/10 or less in order to perform ADLS and functional activities.    Time  3    Period  Weeks    Status  On-going 6/10 after ironing        PT Long Term Goals - 11/15/17 0947      PT LONG TERM GOAL #1   Title  Patient will improve right shoulder AROM flexion and abduction to 140 + degrees in order to perform functional activities and ADLs.     Time  6    Period  Weeks    Status  On-going      PT LONG TERM GOAL #2   Title  Patient will improve right shoulder ER to 60+ degrees in order to wash and style hair and don/doff apparel.     Time  6    Period  Weeks    Status  On-going      PT LONG TERM GOAL #3   Title  Patient will improve R shoulder MMT in all planes to 4/5 or greater to improve ability to perform functional activities.    Time  6    Period  Weeks    Status  On-going      PT LONG TERM GOAL #4   Title  Patient will report ability to drive with no reports of pain with shoulder motion.    Period  Weeks    Status  On-going has not started driving yet      PT LONG TERM GOAL #5   Title  Patient will improve right grip strength to equal left.     Time  6    Period  Weeks     Status  On-going            Plan - 11/24/17 0940    Clinical Impression Statement  Patient was able to tolerate addition of exercises with some reports of "pulling" in right shoulder. Patient required verbal cuing to prevent shoulder elevation during behind the back IR exercise. Patient was able to perfrom with correct form for the remaining reps. Normal response to modalities upon removal.     Clinical Presentation  Stable    Clinical Decision Making  Low    Rehab Potential  Good    PT Frequency  3x / week    PT Duration  6 weeks    PT Treatment/Interventions  ADLs/Self Care Home Management;Cryotherapy;Moist Heat;Vasopneumatic Device;Taping;Dry needling;Passive range of motion;Manual techniques;Patient/family education;Therapeutic exercise;Therapeutic activities;Ultrasound;Iontophoresis 4mg /ml Dexamethasone    PT Next Visit Plan  FOTO Cont AROM and strengthening exrecises, continue AAROM exercises for flexion ;and modalities PRN. VASO ONLY    Consulted and Agree with Plan of Care  Patient       Patient will benefit from skilled therapeutic intervention in order to improve the following deficits and impairments:  Pain, Decreased activity tolerance, Decreased range of motion, Decreased strength, Impaired UE functional use  Visit Diagnosis: Right shoulder pain, unspecified chronicity  Stiffness of right shoulder, not elsewhere classified  Muscle weakness (generalized)     Problem List Patient Active Problem List   Diagnosis Date Noted  . Closed fracture of proximal end of right humerus 09/24/17 10/17/2017  . Allergic rhinitis due to pollen 01/26/2017  . Body mass index 29.0-29.9, adult 03/02/2016  . Palpitation 07/13/2015  . Ovarian abscess due to diverticulitis s/p LSO 11/15/2013 11/16/2013  . 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 CHEST PAIN-UNSPECIFIED 07/03/2010  . CARDIAC MURMUR 05/22/2010  . Anxiety state 11/07/2008  . Complete heart  block ([place]) 11/07/2008  . COLONIC POLYPS, HX OF 06/28/2008  . Hyperlipidemia 06/25/2008  . Essential hypertension 06/25/2008  . Mitral valve disorder 06/25/2008  . Cardiomyopathy ([place]) 06/25/2008  . GERD 06/25/2008  . HIATAL HERNIA 06/25/2008   Gabriela Eves, PT, DPT 11/24/2017, 10:05 AM  [facility] [place], [place], [address] Phone: [phone]   Fax:  [phone]  Name: Carmen Green MRN: [id number] Date of Birth: 03/15/1951

## 2017-11-29 ENCOUNTER — Encounter: Payer: Self-pay | Admitting: Physical Therapy

## 2017-11-29 ENCOUNTER — Ambulatory Visit: Payer: Medicare Other | Admitting: Physical Therapy

## 2017-11-29 DIAGNOSIS — M25611 Stiffness of right shoulder, not elsewhere classified: Secondary | ICD-10-CM | POA: Diagnosis not present

## 2017-11-29 DIAGNOSIS — M6281 Muscle weakness (generalized): Secondary | ICD-10-CM

## 2017-11-29 DIAGNOSIS — M25511 Pain in right shoulder: Secondary | ICD-10-CM

## 2017-11-29 NOTE — Therapy (Signed)
[facility] [place], [place], [address] Phone: [phone]   Fax:  [phone]  Physical Therapy Treatment  Patient Details  Name: Carmen Green MRN: [id number] Date of Birth: [birth year] Referring Provider: Carole Civil.   Encounter Date: 11/29/2017  PT End of Session - 11/29/17 0950    Visit Number  12    Number of Visits  18    Date for PT Re-Evaluation  12/13/17    Authorization Type  FOTO every 5th visit, progress note every 10th visit; KX modifier after 15th visit.    PT Start Time  0900    PT Stop Time  0957    PT Time Calculation (min)  57 min    Activity Tolerance  Patient tolerated treatment well    Behavior During Therapy  WFL for tasks assessed/performed       Past Medical History:  Diagnosis Date  . Allergy   . Anemia   . Anxiety   . Arthritis   . Cataract    bil cataracts removed  . Colon polyp   . Complete heart block ([place])   . Contusion of chest wall 10/10/2007   Centricity Description: CHEST WALL CONTUSION Qualifier: Diagnosis of  By: Aline Brochure MD, Dorothyann Peng   Centricity Description: CONTUSION, RIGHT RIB Qualifier: Diagnosis of  By: Aline Brochure MD, Dorothyann Peng    . Depression   . Diverticulitis   . Esophagitis   . ESOPHAGITIS 06/25/2008   Qualifier: History of  By: Nelson-Smith CMA (AAMA), Dottie    . GERD (gastroesophageal reflux disease)   . H/O seasonal allergies   . Heart murmur   . Hiatal hernia   . 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 Hypertension   . Mitral valve prolapse   . Pacemaker     Past Surgical History:  Procedure Laterality Date  . APPENDECTOMY    . BREAST SURGERY Right    biopsy  . COLONOSCOPY  10/19/2004   LEC  . EYE SURGERY Left    cataract extraction with IOL  . LAPAROSCOPIC LOW ANTERIOR RESECTION  11/16/2013   Robotic  . LAPAROSCOPIC LYSIS OF ADHESIONS N/A 11/15/2013   Procedure:  LAPAROSCOPIC LYSIS OF ADHESIONS;  Surgeon: Adin Hector, MD;  Location: [facility];  Service: General;  Laterality: N/A;  . left ovary cyst removal  1972  . [place], 2004, 2012   device dependant  . POLYPECTOMY    . PROCTOSCOPY  11/15/2013   Procedure: RIGID PROCTOSCOPY;  Surgeon: Adin Hector, MD;  Location: [facility];  Service: General;;  . ROBOTIC ASSISTED SALPINGO OOPHERECTOMY Left 11/16/2013   Robotic en bloc w LAR resection  . TUBAL LIGATION    . UPPER GASTROINTESTINAL ENDOSCOPY      There were no vitals filed for this visit.  Subjective Assessment - 11/29/17 0919    Subjective  Patient reported feeling alright.    Pertinent History  right proximal humerus fx 09/24/17, PACEMAKER, HTN    Limitations  Lifting;House hold activities    Diagnostic tests  X-Ray: normal healing of fx.    Patient Stated Goals  get back to normal    Currently in Pain?  No/denies         [facility] PT Assessment - 11/29/17 0001      Assessment   Medical Diagnosis  Right closed non-displaced fracture of the proximal end of the humerus  Onset Date/Surgical Date  09/24/17    Hand Dominance  Right    Next MD Visit  11/28/2017    Prior Therapy  no      AROM   Right Shoulder Flexion  135 Degrees    Right Shoulder External Rotation  60 Degrees                   [facility] Adult PT Treatment/Exercise - 11/29/17 0001      Exercises   Exercises  Shoulder      Shoulder Exercises: Standing   External Rotation  Right;20 reps;Theraband;Strengthening    Theraband Level (Shoulder External Rotation)  Level 1 (Yellow)    Internal Rotation  Strengthening;Left;20 reps;Theraband    Theraband Level (Shoulder Internal Rotation)  Level 1 (Yellow)    Flexion  AAROM;Right;20 reps    Retraction  Strengthening;Right;20 reps;Theraband    Theraband Level (Shoulder Retraction)  Level 1 (Yellow)      Shoulder Exercises: Pulleys   Flexion  5 minutes      Shoulder Exercises: ROM/Strengthening    UBE (Upper Arm Bike)  120 RPM, 8 minutes (4 fwd, 4 bwd)      Modalities   Modalities  Vasopneumatic      Vasopneumatic   Number Minutes Vasopneumatic   15 minutes    Vasopnuematic Location   Shoulder    Vasopneumatic Pressure  Low    Vasopneumatic Temperature   34      Manual Therapy   Manual Therapy  Passive ROM    Passive ROM  PROM of R shoulder into flexion, ER with holds at end range               PT Short Term Goals - 11/17/17 0947      PT SHORT TERM GOAL #1   Title  Patient will be independent with HEP    Time  3    Period  Weeks    Status  Achieved      PT SHORT TERM GOAL #2   Title  Patient will improve right shoulder AROM flexion to 90 + degrees with less than 2/10 pain.    Time  3    Period  Weeks    Status  Achieved      PT SHORT TERM GOAL #3   Title  Patient will decrease at worst pain to 5/10 or less in order to perform ADLS and functional activities.    Time  3    Period  Weeks    Status  On-going 6/10 after ironing        PT Long Term Goals - 11/15/17 0947      PT LONG TERM GOAL #1   Title  Patient will improve right shoulder AROM flexion and abduction to 140 + degrees in order to perform functional activities and ADLs.     Time  6    Period  Weeks    Status  On-going      PT LONG TERM GOAL #2   Title  Patient will improve right shoulder ER to 60+ degrees in order to wash and style hair and don/doff apparel.     Time  6    Period  Weeks    Status  On-going      PT LONG TERM GOAL #3   Title  Patient will improve R shoulder MMT in all planes to 4/5 or greater to improve ability to perform functional activities.    Time  6  Period  Weeks    Status  On-going      PT LONG TERM GOAL #4   Title  Patient will report ability to drive with no reports of pain with shoulder motion.    Period  Weeks    Status  On-going has not started driving yet      PT LONG TERM GOAL #5   Title  Patient will improve right grip strength to equal left.      Time  6    Period  Weeks    Status  On-going            Plan - 11/29/17 0957    Clinical Impression Statement  Patient was able to tolerate treatment well. Patient noted with improved AROM ER. Patient continues to have difficulties with ADLs but reported significant improvement. Patient still noted with decreased muscular endurance while performing exercises. Normal response to modalities upon removal.     Clinical Presentation  Stable    Clinical Decision Making  Low    Rehab Potential  Good    PT Frequency  3x / week    PT Duration  6 weeks    PT Treatment/Interventions  ADLs/Self Care Home Management;Cryotherapy;Moist Heat;Vasopneumatic Device;Taping;Dry needling;Passive range of motion;Manual techniques;Patient/family education;Therapeutic exercise;Therapeutic activities;Ultrasound;Iontophoresis 4mg /ml Dexamethasone    PT Next Visit Plan  Cont AROM and strengthening exrecises, continue AAROM exercises for flexion ;and modalities PRN. VASO ONLY    Consulted and Agree with Plan of Care  Patient       Patient will benefit from skilled therapeutic intervention in order to improve the following deficits and impairments:  Pain, Decreased activity tolerance, Decreased range of motion, Decreased strength, Impaired UE functional use  Visit Diagnosis: Right shoulder pain, unspecified chronicity  Stiffness of right shoulder, not elsewhere classified  Muscle weakness (generalized)     Problem List Patient Active Problem List   Diagnosis Date Noted  . Closed fracture of proximal end of right humerus 09/24/17 10/17/2017  . Allergic rhinitis due to pollen 01/26/2017  . Body mass index 29.0-29.9, adult 03/02/2016  . Palpitation 07/13/2015  . Ovarian abscess due to diverticulitis s/p LSO 11/15/2013 11/16/2013  . 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 CHEST PAIN-UNSPECIFIED 07/03/2010  . CARDIAC MURMUR 05/22/2010  . Anxiety state 11/07/2008  .  Complete heart block ([place]) 11/07/2008  . COLONIC POLYPS, HX OF 06/28/2008  . Hyperlipidemia 06/25/2008  . Essential hypertension 06/25/2008  . Mitral valve disorder 06/25/2008  . Cardiomyopathy ([place]) 06/25/2008  . GERD 06/25/2008  . HIATAL HERNIA 06/25/2008   Gabriela Eves, PT, DPT 11/29/2017, 12:20 PM  [facility] [address] [place], [place], [address] Phone: [phone]   Fax:  [phone]  Name: Carmen Green MRN: [id number] Date of Birth: 1951-03-14

## 2017-11-30 ENCOUNTER — Encounter: Payer: Self-pay | Admitting: Orthopedic Surgery

## 2017-11-30 ENCOUNTER — Ambulatory Visit (INDEPENDENT_AMBULATORY_CARE_PROVIDER_SITE_OTHER): Payer: Medicare Other | Admitting: Orthopedic Surgery

## 2017-11-30 VITALS — BP 137/79 | HR 74 | Ht 61.5 in | Wt 147.0 lb

## 2017-11-30 DIAGNOSIS — S42294D Other nondisplaced fracture of upper end of right humerus, subsequent encounter for fracture with routine healing: Secondary | ICD-10-CM

## 2017-11-30 NOTE — Addendum Note (Signed)
Addended byCandice Camp on: 11/30/2017 04:45 PM   Modules accepted: Orders

## 2017-11-30 NOTE — Progress Notes (Signed)
Fracture care follow-up  Chief Complaint  Patient presents with  . Shoulder Injury    right shoulder improving still has some soreness (after PT) and decreased motion (4/6)    Encounter Diagnosis  Name Primary?  . Other closed nondisplaced fracture of proximal end of right humerus with routine healing, subsequent encounter 09/24/17 Yes     Current Outpatient Medications:  .  acetaminophen (TYLENOL 8 HOUR) 650 MG CR tablet, Take 1 tablet (650 mg total) by mouth every 8 (eight) hours., Disp: 30 tablet, Rfl: 0 .  buPROPion (WELLBUTRIN XL) 150 MG 24 hr tablet, TAKE 2 TABLETS BY MOUTH DAILY, Disp: 180 tablet, Rfl: 3 .  cetirizine (ZYRTEC) 10 MG tablet, Take 10 mg by mouth daily as needed for allergies. , Disp: , Rfl:  .  Cholecalciferol (VITAMIN D3) 1000 units CAPS, Take 1 capsule by mouth daily., Disp: , Rfl:  .  esomeprazole (NEXIUM) 40 MG capsule, Take 1 capsule (40 mg total) by mouth daily at 12 noon., Disp: 90 capsule, Rfl: 3 .  estradiol (ESTRACE) 0.5 MG tablet, Take 1 tablet (0.5 mg total) by mouth daily., Disp: 90 tablet, Rfl: 3 .  fluticasone (FLONASE) 50 MCG/ACT nasal spray, Place 1 spray into both nostrils daily as needed for allergies., Disp: 48 g, Rfl: 3 .  guaiFENesin (MUCINEX) 600 MG 12 hr tablet, Take 600 mg by mouth 2 (two) times daily as needed for cough. , Disp: , Rfl:  .  HYDROcodone-acetaminophen (NORCO/VICODIN) 5-325 MG tablet, Take 1 tablet by mouth every 8 (eight) hours as needed., Disp: 21 tablet, Rfl: 0 .  lisinopril-hydrochlorothiazide (PRINZIDE,ZESTORETIC) 20-12.5 MG tablet, Take 1 tablet by mouth every morning., Disp: 90 tablet, Rfl: 1 .  LORazepam (ATIVAN) 1 MG tablet, TAKE 1 TABLET EVERY 8 HOURS AS NEEDED FOR ANXIETY, Disp: 90 tablet, Rfl: 1 .  medroxyPROGESTERone (PROVERA) 2.5 MG tablet, Take 1 tablet (2.5 mg total) by mouth daily., Disp: 90 tablet, Rfl: 3 .  meloxicam (MOBIC) 7.5 MG tablet, Take 1 tablet (7.5 mg total) by mouth daily. (Patient taking differently:  Take 7.5 mg by mouth as needed. ), Disp: 30 tablet, Rfl: 5 .  Multiple Vitamin (MULTIVITAMIN) tablet, Take 1 tablet by mouth daily.  , Disp: , Rfl:  .  omeprazole (PRILOSEC) 40 MG capsule, TAKE (1) CAPSULE DAILY, Disp: 30 capsule, Rfl: 0 .  polyethylene glycol powder (GLYCOLAX/MIRALAX) powder, Take 1 Container by mouth as needed for mild constipation. , Disp: , Rfl:  .  ranitidine (ZANTAC) 150 MG tablet, Take 1 tablet (150 mg total) by mouth at bedtime., Disp: 30 tablet, Rfl: 11 .  clindamycin (CLEOCIN) 150 MG capsule, TAKE 4 CAPSULES 1 HOUR PRIOR TO DENTAL VISIT (Patient not taking: Reported on 11/30/2017), Disp: 4 capsule, Rfl: 0   She is doing well her pain is well controlled.  Her range of motion is improving she can do almost all of her activities daily living  BP 137/79   Pulse 74   Ht 5' 1.5" (1.562 m)   Wt 147 lb (66.7 kg)   BMI 27.33 kg/m   Physical Exam   Xrays: none  Plan Therapy notes indicate excellent overall range of motion with some weakness and muscle fatigue  Continue physical therapy return in 6 weeks

## 2017-12-01 ENCOUNTER — Ambulatory Visit: Payer: Medicare Other | Admitting: Physical Therapy

## 2017-12-01 ENCOUNTER — Encounter: Payer: Self-pay | Admitting: Physical Therapy

## 2017-12-01 DIAGNOSIS — M25611 Stiffness of right shoulder, not elsewhere classified: Secondary | ICD-10-CM | POA: Diagnosis not present

## 2017-12-01 DIAGNOSIS — M6281 Muscle weakness (generalized): Secondary | ICD-10-CM | POA: Diagnosis not present

## 2017-12-01 DIAGNOSIS — M25511 Pain in right shoulder: Secondary | ICD-10-CM

## 2017-12-01 NOTE — Therapy (Addendum)
[facility] [place], [place], [address] Phone: [phone]   Fax:  [phone]  Physical Therapy Treatment  Patient Details  Name: Carmen Green MRN: [id number] Date of Birth: 1950/10/28 Referring Provider: Carole Civil.   Encounter Date: 12/01/2017  PT End of Session - 12/01/17 0904    Visit Number  13    Number of Visits  24 per NO order signed by MD     Date for PT Re-Evaluation  12/29/17    Authorization Type  FOTO every 5th visit, progress note every 10th visit; KX modifier after 15th visit.    PT Start Time  0901    PT Stop Time  0954    PT Time Calculation (min)  53 min    Activity Tolerance  Patient tolerated treatment well    Behavior During Therapy  [facility] for tasks assessed/performed       Past Medical History:  Diagnosis Date  . Allergy   . Anemia   . Anxiety   . Arthritis   . Cataract    bil cataracts removed  . Colon polyp   . Complete heart block ([place])   . Contusion of chest wall 10/10/2007   Centricity Description: CHEST WALL CONTUSION Qualifier: Diagnosis of  By: Aline Brochure MD, Dorothyann Peng   Centricity Description: CONTUSION, RIGHT RIB Qualifier: Diagnosis of  By: Aline Brochure MD, Dorothyann Peng    . Depression   . Diverticulitis   . Esophagitis   . ESOPHAGITIS 06/25/2008   Qualifier: History of  By: Nelson-Smith CMA (AAMA), Dottie    . GERD (gastroesophageal reflux disease)   . H/O seasonal allergies   . Heart murmur   . Hiatal hernia   . 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 Hypertension   . Mitral valve prolapse   . Pacemaker     Past Surgical History:  Procedure Laterality Date  . APPENDECTOMY    . BREAST SURGERY Right    biopsy  . COLONOSCOPY  10/19/2004   LEC  . EYE SURGERY Left    cataract extraction with IOL  . LAPAROSCOPIC LOW ANTERIOR RESECTION  11/16/2013   Robotic  . LAPAROSCOPIC LYSIS OF ADHESIONS N/A  11/15/2013   Procedure: LAPAROSCOPIC LYSIS OF ADHESIONS;  Surgeon: Adin Hector, MD;  Location: [facility];  Service: General;  Laterality: N/A;  . left ovary cyst removal  1972  . [place], 2004, 2012   device dependant  . POLYPECTOMY    . PROCTOSCOPY  11/15/2013   Procedure: RIGID PROCTOSCOPY;  Surgeon: Adin Hector, MD;  Location: [facility];  Service: General;;  . ROBOTIC ASSISTED SALPINGO OOPHERECTOMY Left 11/16/2013   Robotic en bloc w LAR resection  . TUBAL LIGATION    . UPPER GASTROINTESTINAL ENDOSCOPY      There were no vitals filed for this visit.  Subjective Assessment - 12/01/17 0902    Subjective  Reports that Dr. Aline Brochure was pleased and wants her to work on strengthening. Reports that she is sore after working with some soup cans last night. Reports that she wishes to be able to put on her bra.    Pertinent History  right proximal humerus fx 09/24/17, PACEMAKER, HTN    Limitations  Lifting;House hold activities    Diagnostic tests  X-Ray: normal healing of fx.    Patient Stated Goals  get back to normal    Currently  in Pain?  Yes    Pain Score  3     Pain Location  Shoulder    Pain Orientation  Right    Pain Descriptors / Indicators  Sore    Pain Type  Chronic pain    Pain Onset  More than a month ago         [facility] PT Assessment - 12/01/17 0001      Assessment   Medical Diagnosis  Right closed non-displaced fracture of the proximal end of the humerus    Onset Date/Surgical Date  09/24/17    Hand Dominance  Right    Next MD Visit  01/11/2018    Prior Therapy  no                   [facility] Adult PT Treatment/Exercise - 12/01/17 0001      Shoulder Exercises: Standing   External Rotation  Strengthening;Right;20 reps;Theraband    Theraband Level (Shoulder External Rotation)  Level 1 (Yellow)    Internal Rotation  Strengthening;Right;20 reps;Theraband    Theraband Level (Shoulder Internal Rotation)  Level 1 (Yellow)    Flexion   Strengthening;Right;20 reps;Weights    Shoulder Flexion Weight (lbs)  1    Extension  Strengthening;Right;20 reps;Theraband    Theraband Level (Shoulder Extension)  Level 1 (Yellow)    Retraction  Strengthening;Right;20 reps;Theraband    Theraband Level (Shoulder Retraction)  Level 1 (Yellow)    Other Standing Exercises  R shoulder wall slides x20 reps      Shoulder Exercises: Pulleys   Flexion  5 minutes      Shoulder Exercises: ROM/Strengthening   UBE (Upper Arm Bike)  90 RPM x6 min    Wall Wash  CW and CCW x20 reps each      Shoulder Exercises: Stretch   Internal Rotation Stretch  2 reps 30 sec    External Rotation Stretch  2 reps;30 seconds      Modalities   Modalities  Vasopneumatic      Vasopneumatic   Number Minutes Vasopneumatic   15 minutes    Vasopnuematic Location   Shoulder    Vasopneumatic Pressure  Low    Vasopneumatic Temperature   34      Manual Therapy   Manual Therapy  Passive ROM    Passive ROM  PROM of R shoulder into flexion, ER, IR with holds at end range               PT Short Term Goals - 11/17/17 0947      PT SHORT TERM GOAL #1   Title  Patient will be independent with HEP    Time  3    Period  Weeks    Status  Achieved      PT SHORT TERM GOAL #2   Title  Patient will improve right shoulder AROM flexion to 90 + degrees with less than 2/10 pain.    Time  3    Period  Weeks    Status  Achieved      PT SHORT TERM GOAL #3   Title  Patient will decrease at worst pain to 5/10 or less in order to perform ADLS and functional activities.    Time  3    Period  Weeks    Status  On-going 6/10 after ironing        PT Long Term Goals - 11/15/17 0947      PT LONG TERM GOAL #1   Title  Patient will improve right shoulder AROM flexion and abduction to 140 + degrees in order to perform functional activities and ADLs.     Time  6    Period  Weeks    Status  On-going      PT LONG TERM GOAL #2   Title  Patient will improve right shoulder  ER to 60+ degrees in order to wash and style hair and don/doff apparel.     Time  6    Period  Weeks    Status  On-going      PT LONG TERM GOAL #3   Title  Patient will improve R shoulder MMT in all planes to 4/5 or greater to improve ability to perform functional activities.    Time  6    Period  Weeks    Status  On-going      PT LONG TERM GOAL #4   Title  Patient will report ability to drive with no reports of pain with shoulder motion.    Period  Weeks    Status  On-going has not started driving yet      PT LONG TERM GOAL #5   Title  Patient will improve right grip strength to equal left.     Time  6    Period  Weeks    Status  On-going            Plan - 12/01/17 0943    Clinical Impression Statement  Patient tolerated today's treatment well with reports of soreness from beginning use of soup cans. Patient tolerated today's strengthening well without complaints. Limited reps of shoulder stretches such as towel stretch and doorway stretch due to discomfort. Firm end feels and smooth arc of motion noted with PROM of R shoulder into all directions. Normal vasopnuematic response noted following removal of the modalities.    Rehab Potential  Good    PT Frequency  3x / week    PT Duration  6 weeks    PT Treatment/Interventions  ADLs/Self Care Home Management;Cryotherapy;Moist Heat;Vasopneumatic Device;Taping;Dry needling;Passive range of motion;Manual techniques;Patient/family education;Therapeutic exercise;Therapeutic activities;Ultrasound;Iontophoresis 4mg /ml Dexamethasone    PT Next Visit Plan  Cont AROM and strengthening exrecises, VASO ONLY    PT Home Exercise Plan  scapular retractions, wall slides, countertop shoulder flexion, AAROM scaption, AAROM ER    Consulted and Agree with Plan of Care  Patient       Patient will benefit from skilled therapeutic intervention in order to improve the following deficits and impairments:  Pain, Decreased activity tolerance, Decreased  range of motion, Decreased strength, Impaired UE functional use  Visit Diagnosis: Right shoulder pain, unspecified chronicity  Stiffness of right shoulder, not elsewhere classified  Muscle weakness (generalized)     Problem List Patient Active Problem List   Diagnosis Date Noted  . Closed fracture of proximal end of right humerus 09/24/17 10/17/2017  . Allergic rhinitis due to pollen 01/26/2017  . Body mass index 29.0-29.9, adult 03/02/2016  . Palpitation 07/13/2015  . Ovarian abscess due to diverticulitis s/p LSO 11/15/2013 11/16/2013  . 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 CHEST PAIN-UNSPECIFIED 07/03/2010  . CARDIAC MURMUR 05/22/2010  . Anxiety state 11/07/2008  . Complete heart block ([place]) 11/07/2008  . COLONIC POLYPS, HX OF 06/28/2008  . Hyperlipidemia 06/25/2008  . Essential hypertension 06/25/2008  . Mitral valve disorder 06/25/2008  . Cardiomyopathy ([place]) 06/25/2008  . GERD 06/25/2008  . HIATAL HERNIA 06/25/2008    Elizebeth Koller  Laveda Abbe, PTA 12/01/2017, 10:02 AM  [facility] [address] [place], [place], [address] Phone: [phone]   Fax:  [phone]  Name: Carmen Green MRN: [id number] Date of Birth: 11-13-1950

## 2017-12-06 ENCOUNTER — Ambulatory Visit: Payer: Medicare Other | Admitting: Physical Therapy

## 2017-12-06 DIAGNOSIS — M6281 Muscle weakness (generalized): Secondary | ICD-10-CM | POA: Diagnosis not present

## 2017-12-06 DIAGNOSIS — M25611 Stiffness of right shoulder, not elsewhere classified: Secondary | ICD-10-CM | POA: Diagnosis not present

## 2017-12-06 DIAGNOSIS — M25511 Pain in right shoulder: Secondary | ICD-10-CM | POA: Diagnosis not present

## 2017-12-06 NOTE — Therapy (Signed)
[facility] [place], [place], [address] Phone: [phone]   Fax:  [phone]  Physical Therapy Treatment  Patient Details  Name: Carmen Green MRN: [id number] Date of Birth: 08-09-1950 Referring Provider: Carole Civil.   Encounter Date: 12/06/2017  PT End of Session - 12/06/17 0914    Visit Number  14    Number of Visits  24    Date for PT Re-Evaluation  12/29/17    Authorization Type  FOTO every 5th visit, progress note every 10th visit; KX modifier after 15th visit.    PT Start Time  0902    PT Stop Time  0958    PT Time Calculation (min)  56 min    Activity Tolerance  Patient tolerated treatment well    Behavior During Therapy  [facility] for tasks assessed/performed       Past Medical History:  Diagnosis Date  . Allergy   . Anemia   . Anxiety   . Arthritis   . Cataract    bil cataracts removed  . Colon polyp   . Complete heart block ([place])   . Contusion of chest wall 10/10/2007   Centricity Description: CHEST WALL CONTUSION Qualifier: Diagnosis of  By: Aline Brochure MD, Dorothyann Peng   Centricity Description: CONTUSION, RIGHT RIB Qualifier: Diagnosis of  By: Aline Brochure MD, Dorothyann Peng    . Depression   . Diverticulitis   . Esophagitis   . ESOPHAGITIS 06/25/2008   Qualifier: History of  By: Nelson-Smith CMA (AAMA), Dottie    . GERD (gastroesophageal reflux disease)   . H/O seasonal allergies   . Heart murmur   . Hiatal hernia   . 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 Hypertension   . Mitral valve prolapse   . Pacemaker     Past Surgical History:  Procedure Laterality Date  . APPENDECTOMY    . BREAST SURGERY Right    biopsy  . COLONOSCOPY  10/19/2004   LEC  . EYE SURGERY Left    cataract extraction with IOL  . LAPAROSCOPIC LOW ANTERIOR RESECTION  11/16/2013   Robotic  . LAPAROSCOPIC LYSIS OF ADHESIONS N/A 11/15/2013   Procedure:  LAPAROSCOPIC LYSIS OF ADHESIONS;  Surgeon: Adin Hector, MD;  Location: [facility];  Service: General;  Laterality: N/A;  . left ovary cyst removal  1972  . [place], 2004, 2012   device dependant  . POLYPECTOMY    . PROCTOSCOPY  11/15/2013   Procedure: RIGID PROCTOSCOPY;  Surgeon: Adin Hector, MD;  Location: [facility];  Service: General;;  . ROBOTIC ASSISTED SALPINGO OOPHERECTOMY Left 11/16/2013   Robotic en bloc w LAR resection  . TUBAL LIGATION    . UPPER GASTROINTESTINAL ENDOSCOPY      There were no vitals filed for this visit.  Subjective Assessment - 12/06/17 0916    Subjective  Patient reports feling great.    Pertinent History  right proximal humerus fx 09/24/17, PACEMAKER, HTN    Limitations  Lifting;House hold activities    Diagnostic tests  X-Ray: normal healing of fx.    Patient Stated Goals  get back to normal    Currently in Pain?  No/denies    Pain Score  0-No pain         [facility] PT Assessment - 12/06/17 0001      Assessment   Medical Diagnosis  Right closed non-displaced fracture  of the proximal end of the humerus    Onset Date/Surgical Date  09/24/17    Hand Dominance  Right    Next MD Visit  01/11/2018    Prior Therapy  no                   [facility] Adult PT Treatment/Exercise - 12/06/17 0001      Exercises   Exercises  Shoulder      Shoulder Exercises: Standing   External Rotation  Strengthening;Right;10 reps;20 reps;Theraband x30    Theraband Level (Shoulder External Rotation)  Level 1 (Yellow)    Internal Rotation  Strengthening;Right;20 reps;Theraband x30    Theraband Level (Shoulder Internal Rotation)  Level 1 (Yellow)    Flexion  Strengthening;Right;20 reps;Theraband x30    Extension  Strengthening;Right;20 reps;Theraband x30    Theraband Level (Shoulder Extension)  Level 1 (Yellow)    Other Standing Exercises  behind the back IR with cane x15      Shoulder Exercises: ROM/Strengthening   UBE (Upper Arm Bike)  90 RPM x8  min (4 fwd, 4 bwd)      Shoulder Exercises: Stretch   Internal Rotation Stretch  30 seconds      Modalities   Modalities  Vasopneumatic      Vasopneumatic   Number Minutes Vasopneumatic   15 minutes    Vasopnuematic Location   Shoulder    Vasopneumatic Pressure  Low    Vasopneumatic Temperature   34      Manual Therapy   Manual Therapy  Passive ROM    Passive ROM  PROM of R shoulder into flexion, abduction, ER, IR with holds at end range               PT Short Term Goals - 11/17/17 0947      PT SHORT TERM GOAL #1   Title  Patient will be independent with HEP    Time  3    Period  Weeks    Status  Achieved      PT SHORT TERM GOAL #2   Title  Patient will improve right shoulder AROM flexion to 90 + degrees with less than 2/10 pain.    Time  3    Period  Weeks    Status  Achieved      PT SHORT TERM GOAL #3   Title  Patient will decrease at worst pain to 5/10 or less in order to perform ADLS and functional activities.    Time  3    Period  Weeks    Status  On-going 6/10 after ironing        PT Long Term Goals - 11/15/17 0947      PT LONG TERM GOAL #1   Title  Patient will improve right shoulder AROM flexion and abduction to 140 + degrees in order to perform functional activities and ADLs.     Time  6    Period  Weeks    Status  On-going      PT LONG TERM GOAL #2   Title  Patient will improve right shoulder ER to 60+ degrees in order to wash and style hair and don/doff apparel.     Time  6    Period  Weeks    Status  On-going      PT LONG TERM GOAL #3   Title  Patient will improve R shoulder MMT in all planes to 4/5 or greater to improve ability to perform functional  activities.    Time  6    Period  Weeks    Status  On-going      PT LONG TERM GOAL #4   Title  Patient will report ability to drive with no reports of pain with shoulder motion.    Period  Weeks    Status  On-going has not started driving yet      PT LONG TERM GOAL #5   Title   Patient will improve right grip strength to equal left.     Time  6    Period  Weeks    Status  On-going            Plan - 12/06/17 1003    Clinical Impression Statement  Patient was able to tolerate treatment well and reported some muscle fatigue after theraband exercises. Patient continues to have difficulties with IR stretch but reported today was more tolerable than last visit. Patient noted with improved IR PROM. Normal response to modalities upon removal.    Clinical Presentation  Stable    Clinical Decision Making  Low    Rehab Potential  Good    PT Frequency  3x / week    PT Duration  6 weeks    PT Treatment/Interventions  ADLs/Self Care Home Management;Cryotherapy;Moist Heat;Vasopneumatic Device;Taping;Dry needling;Passive range of motion;Manual techniques;Patient/family education;Therapeutic exercise;Therapeutic activities;Ultrasound;Iontophoresis 4mg /ml Dexamethasone    PT Next Visit Plan  FOTO next visit. Cont AROM and strengthening exrecises, VASO ONLY    Consulted and Agree with Plan of Care  Patient       Patient will benefit from skilled therapeutic intervention in order to improve the following deficits and impairments:  Pain, Decreased activity tolerance, Decreased range of motion, Decreased strength, Impaired UE functional use  Visit Diagnosis: Right shoulder pain, unspecified chronicity  Stiffness of right shoulder, not elsewhere classified  Muscle weakness (generalized)     Problem List Patient Active Problem List   Diagnosis Date Noted  . Closed fracture of proximal end of right humerus 09/24/17 10/17/2017  . Allergic rhinitis due to pollen 01/26/2017  . Body mass index 29.0-29.9, adult 03/02/2016  . Palpitation 07/13/2015  . Ovarian abscess due to diverticulitis s/p LSO 11/15/2013 11/16/2013  . 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 CHEST PAIN-UNSPECIFIED 07/03/2010  . CARDIAC MURMUR 05/22/2010  . Anxiety state  11/07/2008  . Complete heart block ([place]) 11/07/2008  . COLONIC POLYPS, HX OF 06/28/2008  . Hyperlipidemia 06/25/2008  . Essential hypertension 06/25/2008  . Mitral valve disorder 06/25/2008  . Cardiomyopathy ([place]) 06/25/2008  . GERD 06/25/2008  . HIATAL HERNIA 06/25/2008   Gabriela Eves, PT, DPT 12/06/2017, 11:14 AM  [facility] [place], [place], [address] Phone: [phone]   Fax:  [phone]  Name: Carmen Green MRN: [id number] Date of Birth: [birth year]

## 2017-12-08 ENCOUNTER — Ambulatory Visit: Payer: Medicare Other | Admitting: Physical Therapy

## 2017-12-08 ENCOUNTER — Encounter: Payer: Self-pay | Admitting: Physical Therapy

## 2017-12-08 DIAGNOSIS — M25611 Stiffness of right shoulder, not elsewhere classified: Secondary | ICD-10-CM

## 2017-12-08 DIAGNOSIS — M25511 Pain in right shoulder: Secondary | ICD-10-CM

## 2017-12-08 DIAGNOSIS — M6281 Muscle weakness (generalized): Secondary | ICD-10-CM | POA: Diagnosis not present

## 2017-12-08 NOTE — Therapy (Signed)
[facility] [place], [place], [address] Phone: [phone]   Fax:  [phone]  Physical Therapy Treatment  Patient Details  Name: Carmen Green MRN: [id number] Date of Birth: 09/08/1950 Referring Provider: Carole Civil.   Encounter Date: 12/08/2017  PT End of Session - 12/08/17 0901    Visit Number  15    Number of Visits  24    Date for PT Re-Evaluation  12/29/17    Authorization Type  FOTO every 5th visit, progress note every 10th visit; KX modifier after 15th visit.    PT Start Time  0901    PT Stop Time  0957    PT Time Calculation (min)  56 min    Activity Tolerance  Patient tolerated treatment well    Behavior During Therapy  WFL for tasks assessed/performed       Past Medical History:  Diagnosis Date  . Allergy   . Anemia   . Anxiety   . Arthritis   . Cataract    bil cataracts removed  . Colon polyp   . Complete heart block ([place])   . Contusion of chest wall 10/10/2007   Centricity Description: CHEST WALL CONTUSION Qualifier: Diagnosis of  By: Aline Brochure MD, Dorothyann Peng   Centricity Description: CONTUSION, RIGHT RIB Qualifier: Diagnosis of  By: Aline Brochure MD, Dorothyann Peng    . Depression   . Diverticulitis   . Esophagitis   . ESOPHAGITIS 06/25/2008   Qualifier: History of  By: Nelson-Smith CMA (AAMA), Dottie    . GERD (gastroesophageal reflux disease)   . H/O seasonal allergies   . Heart murmur   . Hiatal hernia   . 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 Hypertension   . Mitral valve prolapse   . Pacemaker     Past Surgical History:  Procedure Laterality Date  . APPENDECTOMY    . BREAST SURGERY Right    biopsy  . COLONOSCOPY  10/19/2004   LEC  . EYE SURGERY Left    cataract extraction with IOL  . LAPAROSCOPIC LOW ANTERIOR RESECTION  11/16/2013   Robotic  . LAPAROSCOPIC LYSIS OF ADHESIONS N/A 11/15/2013   Procedure:  LAPAROSCOPIC LYSIS OF ADHESIONS;  Surgeon: Adin Hector, MD;  Location: [facility];  Service: General;  Laterality: N/A;  . left ovary cyst removal  1972  . [place], 2004, 2012   device dependant  . POLYPECTOMY    . PROCTOSCOPY  11/15/2013   Procedure: RIGID PROCTOSCOPY;  Surgeon: Adin Hector, MD;  Location: [facility];  Service: General;;  . ROBOTIC ASSISTED SALPINGO OOPHERECTOMY Left 11/16/2013   Robotic en bloc w LAR resection  . TUBAL LIGATION    . UPPER GASTROINTESTINAL ENDOSCOPY      There were no vitals filed for this visit.      [facility] PT Assessment - 12/08/17 0001      Assessment   Medical Diagnosis  Right closed non-displaced fracture of the proximal end of the humerus    Onset Date/Surgical Date  09/24/17    Hand Dominance  Right    Next MD Visit  01/11/2018    Prior Therapy  no                   [facility] Adult PT Treatment/Exercise - 12/08/17 0001      Exercises   Exercises  Shoulder  Shoulder Exercises: Standing   Horizontal ABduction  Strengthening;Both;20 reps;Theraband    Theraband Level (Shoulder Horizontal ABduction)  Level 1 (Yellow)      Shoulder Exercises: Pulleys   Flexion  5 minutes      Shoulder Exercises: ROM/Strengthening   UBE (Upper Arm Bike)  90 RPM x8 min (4 fwd, 4 bwd)      Shoulder Exercises: Stretch   Corner Stretch  4 reps;30 seconds    Other Shoulder Stretches  R sleeper stretch 3x30"      Modalities   Modalities  Vasopneumatic      Vasopneumatic   Number Minutes Vasopneumatic   15 minutes    Vasopnuematic Location   Shoulder    Vasopneumatic Pressure  Low    Vasopneumatic Temperature   34      Manual Therapy   Manual Therapy  Passive ROM    Passive ROM  PROM of R shoulder into flexion, abduction, ER, IR with holds at end range               PT Short Term Goals - 11/17/17 0947      PT SHORT TERM GOAL #1   Title  Patient will be independent with HEP    Time  3    Period  Weeks     Status  Achieved      PT SHORT TERM GOAL #2   Title  Patient will improve right shoulder AROM flexion to 90 + degrees with less than 2/10 pain.    Time  3    Period  Weeks    Status  Achieved      PT SHORT TERM GOAL #3   Title  Patient will decrease at worst pain to 5/10 or less in order to perform ADLS and functional activities.    Time  3    Period  Weeks    Status  On-going 6/10 after ironing        PT Long Term Goals - 11/15/17 0947      PT LONG TERM GOAL #1   Title  Patient will improve right shoulder AROM flexion and abduction to 140 + degrees in order to perform functional activities and ADLs.     Time  6    Period  Weeks    Status  On-going      PT LONG TERM GOAL #2   Title  Patient will improve right shoulder ER to 60+ degrees in order to wash and style hair and don/doff apparel.     Time  6    Period  Weeks    Status  On-going      PT LONG TERM GOAL #3   Title  Patient will improve R shoulder MMT in all planes to 4/5 or greater to improve ability to perform functional activities.    Time  6    Period  Weeks    Status  On-going      PT LONG TERM GOAL #4   Title  Patient will report ability to drive with no reports of pain with shoulder motion.    Period  Weeks    Status  On-going has not started driving yet      PT LONG TERM GOAL #5   Title  Patient will improve right grip strength to equal left.     Time  6    Period  Weeks    Status  On-going  Plan - 12/08/17 0956    Clinical Impression Statement  Patient was able to tolerate treatment well despite reports of soreness in the right shoulder. Patient noted with increased muscle tightness during horizontal abduction and made improvement after PROM. Patient noted with improved function as noted by FOTO limitation of 38%. Normal response to modalities upon removal.    Clinical Presentation  Stable    Clinical Decision Making  Low    Rehab Potential  Good    PT Frequency  3x / week    PT  Duration  6 weeks    PT Treatment/Interventions  ADLs/Self Care Home Management;Cryotherapy;Moist Heat;Vasopneumatic Device;Taping;Dry needling;Passive range of motion;Manual techniques;Patient/family education;Therapeutic exercise;Therapeutic activities;Ultrasound;Iontophoresis 4mg /ml Dexamethasone    PT Next Visit Plan  Cont AROM and strengthening exrecises, VASO ONLY    PT Home Exercise Plan  internal rotation stretch, sleeper stretch, horizontal abduction stretch    Consulted and Agree with Plan of Care  Patient       Patient will benefit from skilled therapeutic intervention in order to improve the following deficits and impairments:  Pain, Decreased activity tolerance, Decreased range of motion, Decreased strength, Impaired UE functional use  Visit Diagnosis: Right shoulder pain, unspecified chronicity  Stiffness of right shoulder, not elsewhere classified  Muscle weakness (generalized)     Problem List Patient Active Problem List   Diagnosis Date Noted  . Closed fracture of proximal end of right humerus 09/24/17 10/17/2017  . Allergic rhinitis due to pollen 01/26/2017  . Body mass index 29.0-29.9, adult 03/02/2016  . Palpitation 07/13/2015  . Ovarian abscess due to diverticulitis s/p LSO 11/15/2013 11/16/2013  . 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 CHEST PAIN-UNSPECIFIED 07/03/2010  . CARDIAC MURMUR 05/22/2010  . Anxiety state 11/07/2008  . Complete heart block ([place]) 11/07/2008  . COLONIC POLYPS, HX OF 06/28/2008  . Hyperlipidemia 06/25/2008  . Essential hypertension 06/25/2008  . Mitral valve disorder 06/25/2008  . Cardiomyopathy ([place]) 06/25/2008  . GERD 06/25/2008  . HIATAL HERNIA 06/25/2008   Gabriela Eves, PT, DPT 12/08/2017, 12:39 PM  [facility] [address] [place], [place], [address] Phone: [phone]   Fax:  [phone]  Name: PRICELLA GAUGH MRN: [id number] Date of  Birth: 30-Jun-1950

## 2017-12-13 ENCOUNTER — Ambulatory Visit: Payer: Medicare Other | Admitting: Physical Therapy

## 2017-12-13 ENCOUNTER — Encounter: Payer: Self-pay | Admitting: Physical Therapy

## 2017-12-13 DIAGNOSIS — M25511 Pain in right shoulder: Secondary | ICD-10-CM | POA: Diagnosis not present

## 2017-12-13 DIAGNOSIS — M25611 Stiffness of right shoulder, not elsewhere classified: Secondary | ICD-10-CM | POA: Diagnosis not present

## 2017-12-13 DIAGNOSIS — M6281 Muscle weakness (generalized): Secondary | ICD-10-CM

## 2017-12-13 NOTE — Therapy (Signed)
[facility] [place], [place], [address] Phone: [phone]   Fax:  [phone]  Physical Therapy Treatment  Patient Details  Name: Carmen Green MRN: [id number] Date of Birth: Mar 06, 1951 Referring Provider: Carole Civil.   Encounter Date: 12/13/2017  PT End of Session - 12/13/17 0959    Visit Number  16    Number of Visits  24    Date for PT Re-Evaluation  12/29/17    Authorization Type  FOTO every 5th visit, progress note every 10th visit; KX modifier after 15th visit.    PT Start Time  [phone]    PT Stop Time  1048    PT Time Calculation (min)  59 min    Activity Tolerance  Patient tolerated treatment well    Behavior During Therapy  WFL for tasks assessed/performed       Past Medical History:  Diagnosis Date  . Allergy   . Anemia   . Anxiety   . Arthritis   . Cataract    bil cataracts removed  . Colon polyp   . Complete heart block ([place])   . Contusion of chest wall 10/10/2007   Centricity Description: CHEST WALL CONTUSION Qualifier: Diagnosis of  By: Aline Brochure MD, Dorothyann Peng   Centricity Description: CONTUSION, RIGHT RIB Qualifier: Diagnosis of  By: Aline Brochure MD, Dorothyann Peng    . Depression   . Diverticulitis   . Esophagitis   . ESOPHAGITIS 06/25/2008   Qualifier: History of  By: Nelson-Smith CMA (AAMA), Dottie    . GERD (gastroesophageal reflux disease)   . H/O seasonal allergies   . Heart murmur   . Hiatal hernia   . 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 Hypertension   . Mitral valve prolapse   . Pacemaker     Past Surgical History:  Procedure Laterality Date  . APPENDECTOMY    . BREAST SURGERY Right    biopsy  . COLONOSCOPY  10/19/2004   LEC  . EYE SURGERY Left    cataract extraction with IOL  . LAPAROSCOPIC LOW ANTERIOR RESECTION  11/16/2013   Robotic  . LAPAROSCOPIC LYSIS OF ADHESIONS N/A 11/15/2013   Procedure:  LAPAROSCOPIC LYSIS OF ADHESIONS;  Surgeon: Adin Hector, MD;  Location: [facility];  Service: General;  Laterality: N/A;  . left ovary cyst removal  1972  . [place], 2004, 2012   device dependant  . POLYPECTOMY    . PROCTOSCOPY  11/15/2013   Procedure: RIGID PROCTOSCOPY;  Surgeon: Adin Hector, MD;  Location: [facility];  Service: General;;  . ROBOTIC ASSISTED SALPINGO OOPHERECTOMY Left 11/16/2013   Robotic en bloc w LAR resection  . TUBAL LIGATION    . UPPER GASTROINTESTINAL ENDOSCOPY      There were no vitals filed for this visit.  Subjective Assessment - 12/13/17 0959    Subjective  Patient reported feeling tight this morning.    Pertinent History  right proximal humerus fx 09/24/17, PACEMAKER, HTN    Limitations  Lifting;House hold activities    Diagnostic tests  X-Ray: normal healing of fx.    Patient Stated Goals  get back to normal    Currently in Pain?  No/denies         [facility] PT Assessment - 12/13/17 0001      Assessment   Medical Diagnosis  Right closed non-displaced fracture of the proximal end of the  humerus    Onset Date/Surgical Date  09/24/17    Hand Dominance  Right    Next MD Visit  01/11/2018    Prior Therapy  no                   [facility] Adult PT Treatment/Exercise - 12/13/17 0001      Exercises   Exercises  Shoulder      Shoulder Exercises: Standing   Other Standing Exercises  behind the back IR with cane x30      Shoulder Exercises: Pulleys   Flexion  5 minutes      Shoulder Exercises: ROM/Strengthening   UBE (Upper Arm Bike)  90 RPM x8 min (4 fwd, 4 bwd)      Shoulder Exercises: Stretch   Cross Chest Stretch  3 reps;30 seconds    External Rotation Stretch  3 reps;30 seconds    Other Shoulder Stretches  R sleeper stretch 3x30"      Modalities   Modalities  Vasopneumatic      Vasopneumatic   Number Minutes Vasopneumatic   15 minutes    Vasopnuematic Location   Shoulder    Vasopneumatic Pressure  Low     Vasopneumatic Temperature   34      Manual Therapy   Manual Therapy  Passive ROM    Passive ROM  PROM of R shoulder into flexion, abduction, ER, IR with holds at end range               PT Short Term Goals - 11/17/17 0947      PT SHORT TERM GOAL #1   Title  Patient will be independent with HEP    Time  3    Period  Weeks    Status  Achieved      PT SHORT TERM GOAL #2   Title  Patient will improve right shoulder AROM flexion to 90 + degrees with less than 2/10 pain.    Time  3    Period  Weeks    Status  Achieved      PT SHORT TERM GOAL #3   Title  Patient will decrease at worst pain to 5/10 or less in order to perform ADLS and functional activities.    Time  3    Period  Weeks    Status  On-going 6/10 after ironing        PT Long Term Goals - 11/15/17 0947      PT LONG TERM GOAL #1   Title  Patient will improve right shoulder AROM flexion and abduction to 140 + degrees in order to perform functional activities and ADLs.     Time  6    Period  Weeks    Status  On-going      PT LONG TERM GOAL #2   Title  Patient will improve right shoulder ER to 60+ degrees in order to wash and style hair and don/doff apparel.     Time  6    Period  Weeks    Status  On-going      PT LONG TERM GOAL #3   Title  Patient will improve R shoulder MMT in all planes to 4/5 or greater to improve ability to perform functional activities.    Time  6    Period  Weeks    Status  On-going      PT LONG TERM GOAL #4   Title  Patient will report ability to drive with  no reports of pain with shoulder motion.    Period  Weeks    Status  On-going has not started driving yet      PT LONG TERM GOAL #5   Title  Patient will improve right grip strength to equal left.     Time  6    Period  Weeks    Status  On-going            Plan - 12/13/17 1218    Clinical Impression Statement  Patient was able to tolerate treatment well with exception of IR towel stretch. Patient noted with  anterior R shoulder pain with activity; patient able to tolerate R sleeper stretch better. Patient required tactile cue to keep R arm in proper positioning. Patient discussed feeling as though her progress as slowed and knows she requires more therapy but is concerned with finances secondary to a high copay. Patient and PT discussed continuing this week at 2x/week and dropping down to 1x/week for remainder of visits with focus on HEP and PROM and manual therapy techniques in PT. Patient reported agreement and understanding. Normal response to modalities upon removal.     Clinical Presentation  Stable    Clinical Decision Making  Low    Rehab Potential  Good    PT Frequency  3x / week    PT Duration  6 weeks    PT Treatment/Interventions  ADLs/Self Care Home Management;Cryotherapy;Moist Heat;Vasopneumatic Device;Taping;Dry needling;Passive range of motion;Manual techniques;Patient/family education;Therapeutic exercise;Therapeutic activities;Ultrasound;Iontophoresis 4mg /ml Dexamethasone    PT Next Visit Plan  PROM to improve IR. Cont AROM and strengthening exrecises, VASO ONLY    Consulted and Agree with Plan of Care  Patient       Patient will benefit from skilled therapeutic intervention in order to improve the following deficits and impairments:  Pain, Decreased activity tolerance, Decreased range of motion, Decreased strength, Impaired UE functional use  Visit Diagnosis: Right shoulder pain, unspecified chronicity  Stiffness of right shoulder, not elsewhere classified  Muscle weakness (generalized)     Problem List Patient Active Problem List   Diagnosis Date Noted  . Closed fracture of proximal end of right humerus 09/24/17 10/17/2017  . Allergic rhinitis due to pollen 01/26/2017  . Body mass index 29.0-29.9, adult 03/02/2016  . Palpitation 07/13/2015  . Ovarian abscess due to diverticulitis s/p LSO 11/15/2013 11/16/2013  . 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 CHEST PAIN-UNSPECIFIED 07/03/2010  . CARDIAC MURMUR 05/22/2010  . Anxiety state 11/07/2008  . Complete heart block ([place]) 11/07/2008  . COLONIC POLYPS, HX OF 06/28/2008  . Hyperlipidemia 06/25/2008  . Essential hypertension 06/25/2008  . Mitral valve disorder 06/25/2008  . Cardiomyopathy ([place]) 06/25/2008  . GERD 06/25/2008  . HIATAL HERNIA 06/25/2008   Gabriela Eves, PT, DPT 12/13/2017, 12:29 PM  [facility] [address] [place], [place], [address] Phone: [phone]   Fax:  [phone]  Name: Carmen Green MRN: [id number] Date of Birth: 04/27/1951

## 2017-12-15 ENCOUNTER — Encounter: Payer: Self-pay | Admitting: Physical Therapy

## 2017-12-15 ENCOUNTER — Ambulatory Visit: Payer: Medicare Other | Admitting: Physical Therapy

## 2017-12-15 DIAGNOSIS — M25511 Pain in right shoulder: Secondary | ICD-10-CM

## 2017-12-15 DIAGNOSIS — M6281 Muscle weakness (generalized): Secondary | ICD-10-CM | POA: Diagnosis not present

## 2017-12-15 DIAGNOSIS — M25611 Stiffness of right shoulder, not elsewhere classified: Secondary | ICD-10-CM | POA: Diagnosis not present

## 2017-12-15 NOTE — Therapy (Signed)
[facility] [place], [place], [address] Phone: [phone]   Fax:  [phone]  Physical Therapy Treatment  Patient Details  Name: Carmen Green MRN: [id number] Date of Birth: Nov 17, 1950 Referring Provider: Carole Civil.   Encounter Date: 12/15/2017  PT End of Session - 12/15/17 0905    Visit Number  17    Number of Visits  24    Date for PT Re-Evaluation  12/29/17    Authorization Type  FOTO every 5th visit, progress note every 10th visit; KX modifier after 15th visit.    PT Start Time  0900    PT Stop Time  0958    PT Time Calculation (min)  58 min    Activity Tolerance  Patient tolerated treatment well    Behavior During Therapy  WFL for tasks assessed/performed       Past Medical History:  Diagnosis Date  . Allergy   . Anemia   . Anxiety   . Arthritis   . Cataract    bil cataracts removed  . Colon polyp   . Complete heart block ([place])   . Contusion of chest wall 10/10/2007   Centricity Description: CHEST WALL CONTUSION Qualifier: Diagnosis of  By: Aline Brochure MD, Dorothyann Peng   Centricity Description: CONTUSION, RIGHT RIB Qualifier: Diagnosis of  By: Aline Brochure MD, Dorothyann Peng    . Depression   . Diverticulitis   . Esophagitis   . ESOPHAGITIS 06/25/2008   Qualifier: History of  By: Nelson-Smith CMA (AAMA), Dottie    . GERD (gastroesophageal reflux disease)   . H/O seasonal allergies   . Heart murmur   . Hiatal hernia   . 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 Hypertension   . Mitral valve prolapse   . Pacemaker     Past Surgical History:  Procedure Laterality Date  . APPENDECTOMY    . BREAST SURGERY Right    biopsy  . COLONOSCOPY  10/19/2004   LEC  . EYE SURGERY Left    cataract extraction with IOL  . LAPAROSCOPIC LOW ANTERIOR RESECTION  11/16/2013   Robotic  . LAPAROSCOPIC LYSIS OF ADHESIONS N/A 11/15/2013   Procedure:  LAPAROSCOPIC LYSIS OF ADHESIONS;  Surgeon: Adin Hector, MD;  Location: [facility];  Service: General;  Laterality: N/A;  . left ovary cyst removal  1972  . [place], 2004, 2012   device dependant  . POLYPECTOMY    . PROCTOSCOPY  11/15/2013   Procedure: RIGID PROCTOSCOPY;  Surgeon: Adin Hector, MD;  Location: [facility];  Service: General;;  . ROBOTIC ASSISTED SALPINGO OOPHERECTOMY Left 11/16/2013   Robotic en bloc w LAR resection  . TUBAL LIGATION    . UPPER GASTROINTESTINAL ENDOSCOPY      There were no vitals filed for this visit.  Subjective Assessment - 12/15/17 0904    Subjective  Patient reports feeling good this morning.    Pertinent History  right proximal humerus fx 09/24/17, PACEMAKER, HTN    Limitations  Lifting;House hold activities    Diagnostic tests  X-Ray: normal healing of fx.    Patient Stated Goals  get back to normal    Currently in Pain?  No/denies         [facility] PT Assessment - 12/15/17 0001      Assessment   Medical Diagnosis  Right closed non-displaced fracture of the proximal end of the  humerus    Onset Date/Surgical Date  09/24/17    Hand Dominance  Right    Next MD Visit  01/11/2018    Prior Therapy  no                   [facility] Adult PT Treatment/Exercise - 12/15/17 0001      Exercises   Exercises  Shoulder      Shoulder Exercises: Standing   Flexion  AAROM;Both;Other (comment) 2 minutes    ABduction  AAROM;Right;Other (comment) 2 minutes    Extension  AAROM;Both;Other (comment) 2 minutes    Other Standing Exercises  behind the back IR with cane x30 followed by behind the back horizontal adduction      Shoulder Exercises: Pulleys   Flexion  5 minutes      Shoulder Exercises: ROM/Strengthening   UBE (Upper Arm Bike)  90 RPM x8 min (4 fwd, 4 bwd)      Manual Therapy   Manual Therapy  Passive ROM;Soft tissue mobilization    Soft tissue mobilization  STW/M to right pectoralis, triceps and lats with PROM to decrease  tightness and pain.    Passive ROM  PROM of R shoulder into flexion, abduction, ER, IR, horizontal abduction with holds at end range               PT Short Term Goals - 11/17/17 0947      PT SHORT TERM GOAL #1   Title  Patient will be independent with HEP    Time  3    Period  Weeks    Status  Achieved      PT SHORT TERM GOAL #2   Title  Patient will improve right shoulder AROM flexion to 90 + degrees with less than 2/10 pain.    Time  3    Period  Weeks    Status  Achieved      PT SHORT TERM GOAL #3   Title  Patient will decrease at worst pain to 5/10 or less in order to perform ADLS and functional activities.    Time  3    Period  Weeks    Status  On-going 6/10 after ironing        PT Long Term Goals - 11/15/17 0947      PT LONG TERM GOAL #1   Title  Patient will improve right shoulder AROM flexion and abduction to 140 + degrees in order to perform functional activities and ADLs.     Time  6    Period  Weeks    Status  On-going      PT LONG TERM GOAL #2   Title  Patient will improve right shoulder ER to 60+ degrees in order to wash and style hair and don/doff apparel.     Time  6    Period  Weeks    Status  On-going      PT LONG TERM GOAL #3   Title  Patient will improve R shoulder MMT in all planes to 4/5 or greater to improve ability to perform functional activities.    Time  6    Period  Weeks    Status  On-going      PT LONG TERM GOAL #4   Title  Patient will report ability to drive with no reports of pain with shoulder motion.    Period  Weeks    Status  On-going has not started driving yet  PT LONG TERM GOAL #5   Title  Patient will improve right grip strength to equal left.     Time  6    Period  Weeks    Status  On-going            Plan - 12/15/17 1219    Clinical Impression Statement  Patient was able to tolerate treatment well. Patient and husband guided through HEP instructed husband on compensatory movements she should avoid  while performing HEP. Patient and husband instructed on how to perform STW/M to anterior chest muscles as well as tricep muscle to decrease pain and impove ROM. Patient and husband reported understanding. Patient continues to demonstrate limited IR after STW/M and PROM. Patient to complete remaining visits at 1x/week secondary to finances. Normal response to modalities at end of session.    Clinical Presentation  Stable    Clinical Decision Making  Low    Rehab Potential  Good    PT Frequency  3x / week    PT Duration  6 weeks    PT Treatment/Interventions  ADLs/Self Care Home Management;Cryotherapy;Moist Heat;Vasopneumatic Device;Taping;Dry needling;Passive range of motion;Manual techniques;Patient/family education;Therapeutic exercise;Therapeutic activities;Ultrasound;Iontophoresis 4mg /ml Dexamethasone    PT Next Visit Plan  PROM to improve IR. Cont AROM and strengthening exrecises, VASO ONLY    Consulted and Agree with Plan of Care  Patient       Patient will benefit from skilled therapeutic intervention in order to improve the following deficits and impairments:  Pain, Decreased activity tolerance, Decreased range of motion, Decreased strength, Impaired UE functional use  Visit Diagnosis: Right shoulder pain, unspecified chronicity  Stiffness of right shoulder, not elsewhere classified  Muscle weakness (generalized)     Problem List Patient Active Problem List   Diagnosis Date Noted  . Closed fracture of proximal end of right humerus 09/24/17 10/17/2017  . Allergic rhinitis due to pollen 01/26/2017  . Body mass index 29.0-29.9, adult 03/02/2016  . Palpitation 07/13/2015  . Ovarian abscess due to diverticulitis s/p LSO 11/15/2013 11/16/2013  . 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 CHEST PAIN-UNSPECIFIED 07/03/2010  . CARDIAC MURMUR 05/22/2010  . Anxiety state 11/07/2008  . Complete heart block ([place]) 11/07/2008  . COLONIC POLYPS, HX OF  06/28/2008  . Hyperlipidemia 06/25/2008  . Essential hypertension 06/25/2008  . Mitral valve disorder 06/25/2008  . Cardiomyopathy ([place]) 06/25/2008  . GERD 06/25/2008  . HIATAL HERNIA 06/25/2008   Gabriela Eves, PT, DPT 12/15/2017, 12:27 PM  [facility] [address] [place], [place], [address] Phone: [phone]   Fax:  [phone]  Name: BRYANNAH BOSTON MRN: [id number] Date of Birth: 1951-05-30

## 2017-12-20 ENCOUNTER — Ambulatory Visit: Payer: Medicare Other | Attending: Orthopedic Surgery | Admitting: Physical Therapy

## 2017-12-20 DIAGNOSIS — M6281 Muscle weakness (generalized): Secondary | ICD-10-CM

## 2017-12-20 DIAGNOSIS — M25511 Pain in right shoulder: Secondary | ICD-10-CM | POA: Diagnosis not present

## 2017-12-20 DIAGNOSIS — M25611 Stiffness of right shoulder, not elsewhere classified: Secondary | ICD-10-CM

## 2017-12-20 NOTE — Therapy (Signed)
[facility] [place], [place], [address] Phone: [phone]   Fax:  [phone]  Physical Therapy Treatment  Patient Details  Name: Carmen Green MRN: [id number] Date of Birth: April 15, 1951  Referring Provider: Carole Civil.   Encounter Date: 12/20/2017  PT End of Session - 12/20/17 0952    Visit Number  18    Number of Visits  24    Date for PT Re-Evaluation  12/29/17    Authorization Type  FOTO every 5th visit, progress note every 10th visit; KX modifier after 15th visit.    PT Start Time  0900    PT Stop Time  0950    PT Time Calculation (min)  50 min    Activity Tolerance  Patient tolerated treatment well    Behavior During Therapy  WFL for tasks assessed/performed       Past Medical History:  Diagnosis Date  . Allergy   . Anemia   . Anxiety   . Arthritis   . Cataract    bil cataracts removed  . Colon polyp   . Complete heart block ([place])   . Contusion of chest wall 10/10/2007   Centricity Description: CHEST WALL CONTUSION Qualifier: Diagnosis of  By: Aline Brochure MD, Dorothyann Peng   Centricity Description: CONTUSION, RIGHT RIB Qualifier: Diagnosis of  By: Aline Brochure MD, Dorothyann Peng    . Depression   . Diverticulitis   . Esophagitis   . ESOPHAGITIS 06/25/2008   Qualifier: History of  By: Nelson-Smith CMA (AAMA), Dottie    . GERD (gastroesophageal reflux disease)   . H/O seasonal allergies   . Heart murmur   . Hiatal hernia   . 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 Hypertension   . Mitral valve prolapse   . Pacemaker     Past Surgical History:  Procedure Laterality Date  . APPENDECTOMY    . BREAST SURGERY Right    biopsy  . COLONOSCOPY  10/19/2004   LEC  . EYE SURGERY Left    cataract extraction with IOL  . LAPAROSCOPIC LOW ANTERIOR RESECTION  11/16/2013   Robotic  . LAPAROSCOPIC LYSIS OF ADHESIONS N/A 11/15/2013   Procedure:  LAPAROSCOPIC LYSIS OF ADHESIONS;  Surgeon: Adin Hector, MD;  Location: [facility];  Service: General;  Laterality: N/A;  . left ovary cyst removal  1972  . [place], 2004, 2012   device dependant  . POLYPECTOMY    . PROCTOSCOPY  11/15/2013   Procedure: RIGID PROCTOSCOPY;  Surgeon: Adin Hector, MD;  Location: [facility];  Service: General;;  . ROBOTIC ASSISTED SALPINGO OOPHERECTOMY Left 11/16/2013   Robotic en bloc w LAR resection  . TUBAL LIGATION    . UPPER GASTROINTESTINAL ENDOSCOPY      There were no vitals filed for this visit.  Subjective Assessment - 12/20/17 1137    Subjective  Patient reported she over did it over the weekend. She reported cleaning the bath tub with her right arm and reported soreness in shoulder as well as ribs. She reports pain is still present but not as bad as Saturday and Sunday.    Pertinent History  right proximal humerus fx 09/24/17, PACEMAKER, HTN    Limitations  Lifting;House hold activities    Diagnostic tests  X-Ray: normal healing of fx.    Patient Stated Goals  get back to normal    Currently in Pain?  Yes did not provide pain scale         [facility] PT Assessment - 12/20/17 0001      Assessment   Medical Diagnosis  Right closed non-displaced fracture of the proximal end of the humerus    Onset Date/Surgical Date  09/24/17    Hand Dominance  Right    Next MD Visit  01/11/2018    Prior Therapy  no                   [facility] Adult PT Treatment/Exercise - 12/20/17 0001      Exercises   Exercises  Shoulder      Shoulder Exercises: Standing   External Rotation  Strengthening;Right;Theraband 2 minutes    Theraband Level (Shoulder External Rotation)  Level 1 (Yellow)    Extension  AAROM;Both;Other (comment) x2 minutes    Other Standing Exercises  behind the back IR with cane x2 minutes followed by behind the back horizontal adduction x2 minutes      Shoulder Exercises: ROM/Strengthening   UBE (Upper Arm Bike)  90 RPM x8  min (4 fwd, 4 bwd)      Modalities   Modalities  Vasopneumatic      Vasopneumatic   Number Minutes Vasopneumatic   15 minutes    Vasopnuematic Location   Shoulder    Vasopneumatic Pressure  Low    Vasopneumatic Temperature   34      Manual Therapy   Manual Therapy  Passive ROM;Soft tissue mobilization    Soft tissue mobilization  STW/M to posterior shoulder and triceps to decrease pain and tightness             PT Education - 12/20/17 1138    Education provided  Yes    Education Details  IR/ ER with yellow theraband    Person(s) Educated  Patient    Methods  Explanation;Demonstration    Comprehension  Verbalized understanding;Returned demonstration       PT Short Term Goals - 11/17/17 0947      PT SHORT TERM GOAL #1   Title  Patient will be independent with HEP    Time  3    Period  Weeks    Status  Achieved      PT SHORT TERM GOAL #2   Title  Patient will improve right shoulder AROM flexion to 90 + degrees with less than 2/10 pain.    Time  3    Period  Weeks    Status  Achieved      PT SHORT TERM GOAL #3   Title  Patient will decrease at worst pain to 5/10 or less in order to perform ADLS and functional activities.    Time  3    Period  Weeks    Status  On-going 6/10 after ironing        PT Long Term Goals - 11/15/17 0947      PT LONG TERM GOAL #1   Title  Patient will improve right shoulder AROM flexion and abduction to 140 + degrees in order to perform functional activities and ADLs.     Time  6    Period  Weeks    Status  On-going      PT LONG TERM GOAL #2   Title  Patient will improve right shoulder ER to 60+ degrees in order to wash and style hair and don/doff apparel.     Time  6    Period  Weeks  Status  On-going      PT LONG TERM GOAL #3   Title  Patient will improve R shoulder MMT in all planes to 4/5 or greater to improve ability to perform functional activities.    Time  6    Period  Weeks    Status  On-going      PT LONG TERM  GOAL #4   Title  Patient will report ability to drive with no reports of pain with shoulder motion.    Period  Weeks    Status  On-going has not started driving yet      PT LONG TERM GOAL #5   Title  Patient will improve right grip strength to equal left.     Time  6    Period  Weeks    Status  On-going            Plan - 12/20/17 1034    Clinical Impression Statement  Patient was able to tolerate treatment well despite reports of "pulling" in right arm with stretches and exercises. Patient continues to have decreased IR ROM but noted with improvements. Patient educated to continue HEP. Normal response to modalities upon removal.    Clinical Presentation  Stable    Clinical Decision Making  Low    Rehab Potential  Good    PT Frequency  3x / week    PT Duration  6 weeks    PT Treatment/Interventions  ADLs/Self Care Home Management;Cryotherapy;Moist Heat;Vasopneumatic Device;Taping;Dry needling;Passive range of motion;Manual techniques;Patient/family education;Therapeutic exercise;Therapeutic activities;Ultrasound;Iontophoresis 4mg /ml Dexamethasone    PT Next Visit Plan  Assess goals; PROM to improve IR. Cont AROM and strengthening exrecises, VASO ONLY    Consulted and Agree with Plan of Care  Patient       Patient will benefit from skilled therapeutic intervention in order to improve the following deficits and impairments:  Pain, Decreased activity tolerance, Decreased range of motion, Decreased strength, Impaired UE functional use  Visit Diagnosis: Right shoulder pain, unspecified chronicity  Stiffness of right shoulder, not elsewhere classified  Muscle weakness (generalized)     Problem List Patient Active Problem List   Diagnosis Date Noted  . Closed fracture of proximal end of right humerus 09/24/17 10/17/2017  . Allergic rhinitis due to pollen 01/26/2017  . Body mass index 29.0-29.9, adult 03/02/2016  . Palpitation 07/13/2015  . Ovarian abscess due to diverticulitis  s/p LSO 11/15/2013 11/16/2013  . 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 CHEST PAIN-UNSPECIFIED 07/03/2010  . CARDIAC MURMUR 05/22/2010  . Anxiety state 11/07/2008  . Complete heart block ([place]) 11/07/2008  . COLONIC POLYPS, HX OF 06/28/2008  . Hyperlipidemia 06/25/2008  . Essential hypertension 06/25/2008  . Mitral valve disorder 06/25/2008  . Cardiomyopathy ([place]) 06/25/2008  . GERD 06/25/2008  . HIATAL HERNIA 06/25/2008    Gabriela Eves, PT, DPT 12/20/2017, 11:40 AM  [facility] [address] [place], [place], [address] Phone: [phone]   Fax:  [phone]  Name: Carmen Green MRN: [id number] Date of Birth: 1950/10/25

## 2017-12-27 ENCOUNTER — Encounter: Payer: Medicare Other | Admitting: Physical Therapy

## 2017-12-28 ENCOUNTER — Ambulatory Visit: Payer: Medicare Other | Admitting: Physical Therapy

## 2017-12-28 DIAGNOSIS — M25511 Pain in right shoulder: Secondary | ICD-10-CM | POA: Diagnosis not present

## 2017-12-28 DIAGNOSIS — M25611 Stiffness of right shoulder, not elsewhere classified: Secondary | ICD-10-CM | POA: Diagnosis not present

## 2017-12-28 DIAGNOSIS — M6281 Muscle weakness (generalized): Secondary | ICD-10-CM

## 2017-12-28 NOTE — Therapy (Signed)
[facility] [place], [place], [address] Phone: [phone]   Fax:  [phone]  Physical Therapy Treatment  Patient Details  Name: Carmen Green MRN: [id number] Date of Birth: 29-Nov-1950 Referring Provider: Carole Civil.   Encounter Date: 12/28/2017  PT End of Session - 12/28/17 1004    Visit Number  19    Number of Visits  24    Date for PT Re-Evaluation  02/10/18    Authorization Type  FOTO every 5th visit, progress note every 10th visit; KX modifier after 15th visit.    PT Start Time  0945    PT Stop Time  1023 no modalities    PT Time Calculation (min)  38 min    Activity Tolerance  Patient tolerated treatment well    Behavior During Therapy  WFL for tasks assessed/performed       Past Medical History:  Diagnosis Date  . Allergy   . Anemia   . Anxiety   . Arthritis   . Cataract    bil cataracts removed  . Colon polyp   . Complete heart block ([place])   . Contusion of chest wall 10/10/2007   Centricity Description: CHEST WALL CONTUSION Qualifier: Diagnosis of  By: Aline Brochure MD, Dorothyann Peng   Centricity Description: CONTUSION, RIGHT RIB Qualifier: Diagnosis of  By: Aline Brochure MD, Dorothyann Peng    . Depression   . Diverticulitis   . Esophagitis   . ESOPHAGITIS 06/25/2008   Qualifier: History of  By: Nelson-Smith CMA (AAMA), Dottie    . GERD (gastroesophageal reflux disease)   . H/O seasonal allergies   . Heart murmur   . Hiatal hernia   . 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 Hypertension   . Mitral valve prolapse   . Pacemaker     Past Surgical History:  Procedure Laterality Date  . APPENDECTOMY    . BREAST SURGERY Right    biopsy  . COLONOSCOPY  10/19/2004   LEC  . EYE SURGERY Left    cataract extraction with IOL  . LAPAROSCOPIC LOW ANTERIOR RESECTION  11/16/2013   Robotic  . LAPAROSCOPIC LYSIS OF ADHESIONS N/A 11/15/2013   Procedure: LAPAROSCOPIC LYSIS OF ADHESIONS;  Surgeon: Adin Hector, MD;  Location: [facility];  Service: General;  Laterality: N/A;  . left ovary cyst removal  1972  . [place], 2004, 2012   device dependant  . POLYPECTOMY    . PROCTOSCOPY  11/15/2013   Procedure: RIGID PROCTOSCOPY;  Surgeon: Adin Hector, MD;  Location: [facility];  Service: General;;  . ROBOTIC ASSISTED SALPINGO OOPHERECTOMY Left 11/16/2013   Robotic en bloc w LAR resection  . TUBAL LIGATION    . UPPER GASTROINTESTINAL ENDOSCOPY      There were no vitals filed for this visit.  Subjective Assessment - 12/28/17 1217    Subjective  Patient reported feeling much better.    Patient is accompained by:  Family member    Pertinent History  right proximal humerus fx 09/24/17, PACEMAKER, HTN    Limitations  Lifting;House hold activities    Diagnostic tests  X-Ray: normal healing of fx.    Patient Stated Goals  get back to normal    Currently in Pain?  No/denies         [facility] PT Assessment - 12/28/17 0001      Assessment   Medical Diagnosis  Right closed non-displaced fracture of the proximal end of the humerus    Onset Date/Surgical Date  09/24/17    Hand Dominance  Right    Next MD Visit  01/11/2018    Prior Therapy  no                   [facility] Adult PT Treatment/Exercise - 12/28/17 0001      Exercises   Exercises  Shoulder      Shoulder Exercises: Standing   ABduction  AAROM;Right;Other (comment) with PVC x3 minutes    Extension  AAROM;Both;Other (comment) with PVC x2 minutes    Other Standing Exercises  behind the back IR with cane x2 minutes followed by behind the back horizontal adduction x2 minutes      Shoulder Exercises: ROM/Strengthening   UBE (Upper Arm Bike)  90 RPM x8 min (4 fwd, 4 bwd)      Manual Therapy   Manual Therapy  Passive ROM;Soft tissue mobilization;Joint mobilization    Joint Mobilization  Grade III posterior joint mobs, long axis distraction, and inferior joint  mobs to improve joint ROM    Passive ROM  PROM of R shoulder into flexion, abduction, ER, IR, horizontal abduction with holds at end range to improve ROM               PT Short Term Goals - 11/17/17 0947      PT SHORT TERM GOAL #1   Title  Patient will be independent with HEP    Time  3    Period  Weeks    Status  Achieved      PT SHORT TERM GOAL #2   Title  Patient will improve right shoulder AROM flexion to 90 + degrees with less than 2/10 pain.    Time  3    Period  Weeks    Status  Achieved      PT SHORT TERM GOAL #3   Title  Patient will decrease at worst pain to 5/10 or less in order to perform ADLS and functional activities.    Time  3    Period  Weeks    Status  On-going 6/10 after ironing        PT Long Term Goals - 11/15/17 0947      PT LONG TERM GOAL #1   Title  Patient will improve right shoulder AROM flexion and abduction to 140 + degrees in order to perform functional activities and ADLs.     Time  6    Period  Weeks    Status  On-going      PT LONG TERM GOAL #2   Title  Patient will improve right shoulder ER to 60+ degrees in order to wash and style hair and don/doff apparel.     Time  6    Period  Weeks    Status  On-going      PT LONG TERM GOAL #3   Title  Patient will improve R shoulder MMT in all planes to 4/5 or greater to improve ability to perform functional activities.    Time  6    Period  Weeks    Status  On-going      PT LONG TERM GOAL #4   Title  Patient will report ability to drive with no reports of pain with shoulder motion.    Period  Weeks    Status  On-going has not started driving yet  PT LONG TERM GOAL #5   Title  Patient will improve right grip strength to equal left.     Time  6    Period  Weeks    Status  On-going            Plan - 12/28/17 1030    Clinical Impression Statement  Patient was able to tolerate treatment well with improved functional IR AROM to L4-L5. Patient reported performing HEP and  reported improvements with functional activities and ADLs. Patient to have a follow up on June 24th. Patient opted out of vaso stating she is not sore or in pain at the moment.     Clinical Presentation  Stable    Clinical Decision Making  Low    Rehab Potential  Good    PT Frequency  3x / week    PT Duration  6 weeks    PT Treatment/Interventions  ADLs/Self Care Home Management;Cryotherapy;Moist Heat;Vasopneumatic Device;Taping;Dry needling;Passive range of motion;Manual techniques;Patient/family education;Therapeutic exercise;Therapeutic activities;Ultrasound;Iontophoresis 4mg /ml Dexamethasone    PT Next Visit Plan  Assess goals ROM measurements MD note next visit; PROM to improve IR. Cont AROM and strengthening exrecises, VASO ONLY    Consulted and Agree with Plan of Care  Patient       Patient will benefit from skilled therapeutic intervention in order to improve the following deficits and impairments:  Pain, Decreased activity tolerance, Decreased range of motion, Decreased strength, Impaired UE functional use  Visit Diagnosis: Right shoulder pain, unspecified chronicity  Stiffness of right shoulder, not elsewhere classified  Muscle weakness (generalized)     Problem List Patient Active Problem List   Diagnosis Date Noted  . Closed fracture of proximal end of right humerus 09/24/17 10/17/2017  . Allergic rhinitis due to pollen 01/26/2017  . Body mass index 29.0-29.9, adult 03/02/2016  . Palpitation 07/13/2015  . Ovarian abscess due to diverticulitis s/p LSO 11/15/2013 11/16/2013  . 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 CHEST PAIN-UNSPECIFIED 07/03/2010  . CARDIAC MURMUR 05/22/2010  . Anxiety state 11/07/2008  . Complete heart block ([place]) 11/07/2008  . COLONIC POLYPS, HX OF 06/28/2008  . Hyperlipidemia 06/25/2008  . Essential hypertension 06/25/2008  . Mitral valve disorder 06/25/2008  . Cardiomyopathy ([place]) 06/25/2008  . GERD  06/25/2008  . HIATAL HERNIA 06/25/2008   Gabriela Eves, PT, DPT 12/28/2017, 12:19 PM  [facility] [address] [place], [place], [address] Phone: [phone]   Fax:  [phone]  Name: Carmen Green MRN: [id number] Date of Birth: Feb 17, 1951

## 2018-01-04 ENCOUNTER — Ambulatory Visit: Payer: Medicare Other | Admitting: Physical Therapy

## 2018-01-04 DIAGNOSIS — M25511 Pain in right shoulder: Secondary | ICD-10-CM | POA: Diagnosis not present

## 2018-01-04 DIAGNOSIS — M6281 Muscle weakness (generalized): Secondary | ICD-10-CM

## 2018-01-04 DIAGNOSIS — M25611 Stiffness of right shoulder, not elsewhere classified: Secondary | ICD-10-CM | POA: Diagnosis not present

## 2018-01-04 NOTE — Therapy (Addendum)
[facility] [place], [place], [address] Phone: [phone]   Fax:  [phone]  Physical Therapy Treatment/Discharge  Progress Note Reporting Period  11/24/17 to 01/04/18  See note below for Objective Data and Assessment of Progress/Goals.      Patient Details  Name: Carmen Green MRN: [id number] Date of Birth: Oct 28, 1950 Referring Provider: Carole Civil.   Encounter Date: 01/04/2018  PT End of Session - 01/04/18 1042    Visit Number  20    Number of Visits  24    Date for PT Re-Evaluation  02/10/18    Authorization Type  FOTO every 5th visit, progress note every 10th visit; KX modifier after 15th visit.    PT Start Time  0945    PT Stop Time  1045    PT Time Calculation (min)  60 min    Activity Tolerance  Patient tolerated treatment well    Behavior During Therapy  WFL for tasks assessed/performed       Past Medical History:  Diagnosis Date  . Allergy   . Anemia   . Anxiety   . Arthritis   . Cataract    bil cataracts removed  . Colon polyp   . Complete heart block ([place])   . Contusion of chest wall 10/10/2007   Centricity Description: CHEST WALL CONTUSION Qualifier: Diagnosis of  By: Aline Brochure MD, Dorothyann Peng   Centricity Description: CONTUSION, RIGHT RIB Qualifier: Diagnosis of  By: Aline Brochure MD, Dorothyann Peng    . Depression   . Diverticulitis   . Esophagitis   . ESOPHAGITIS 06/25/2008   Qualifier: History of  By: Nelson-Smith CMA (AAMA), Dottie    . GERD (gastroesophageal reflux disease)   . H/O seasonal allergies   . Heart murmur   . Hiatal hernia   . 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 Hypertension   . Mitral valve prolapse   . Pacemaker     Past Surgical History:  Procedure Laterality Date  . APPENDECTOMY    . BREAST SURGERY Right    biopsy  . COLONOSCOPY  10/19/2004   LEC  . EYE SURGERY Left    cataract extraction  with IOL  . LAPAROSCOPIC LOW ANTERIOR RESECTION  11/16/2013   Robotic  . LAPAROSCOPIC LYSIS OF ADHESIONS N/A 11/15/2013   Procedure: LAPAROSCOPIC LYSIS OF ADHESIONS;  Surgeon: Adin Hector, MD;  Location: [facility];  Service: General;  Laterality: N/A;  . left ovary cyst removal  1972  . [place], 2004, 2012   device dependant  . POLYPECTOMY    . PROCTOSCOPY  11/15/2013   Procedure: RIGID PROCTOSCOPY;  Surgeon: Adin Hector, MD;  Location: [facility];  Service: General;;  . ROBOTIC ASSISTED SALPINGO OOPHERECTOMY Left 11/16/2013   Robotic en bloc w LAR resection  . TUBAL LIGATION    . UPPER GASTROINTESTINAL ENDOSCOPY      There were no vitals filed for this visit.      [facility] PT Assessment - 01/04/18 0001      Assessment   Medical Diagnosis  Right closed non-displaced fracture of the proximal end of the humerus                           PT Education - 01/04/18 1051    Education Details  ER stretch, self myofasical release with racquetball  Person(s) Educated  Patient    Methods  Explanation;Demonstration    Comprehension  Verbalized understanding       PT Short Term Goals - 11/17/17 0947      PT SHORT TERM GOAL #1   Title  Patient will be independent with HEP    Time  3    Period  Weeks    Status  Achieved      PT SHORT TERM GOAL #2   Title  Patient will improve right shoulder AROM flexion to 90 + degrees with less than 2/10 pain.    Time  3    Period  Weeks    Status  Achieved      PT SHORT TERM GOAL #3   Title  Patient will decrease at worst pain to 5/10 or less in order to perform ADLS and functional activities.    Time  3    Period  Weeks    Status  On-going 6/10 after ironing        PT Long Term Goals - 01/04/18 1052      PT LONG TERM GOAL #1   Title  Patient will improve right shoulder AROM flexion and abduction to 140 + degrees in order to perform functional activities and ADLs.     Time  6    Period  Weeks     Status  On-going      PT LONG TERM GOAL #2   Title  Patient will improve right shoulder ER to 60+ degrees in order to wash and style hair and don/doff apparel.     Time  6    Period  Weeks    Status  Achieved      PT LONG TERM GOAL #3   Title  Patient will improve R shoulder MMT in all planes to 4/5 or greater to improve ability to perform functional activities.    Time  6    Period  Weeks    Status  Achieved      PT LONG TERM GOAL #4   Title  Patient will report ability to drive with no reports of pain with shoulder motion.    Time  6    Period  Weeks    Status  Achieved      PT LONG TERM GOAL #5   Title  Patient will improve right grip strength to equal left.     Time  6    Period  Weeks    Status  On-going            Plan - 01/04/18 1053    Clinical Impression Statement  Patient was able to tolerate treatment well. Patient was able to tolerate STW/M to posterior cuff muscles with no reports of increased pain. Patient noted with improved IR AROM after STW/M. Patient instructed on how to perform self myofasical release with raquetball/tennis ball. Patient reported understanding. Patient opted out of vasopneumatic device today. Patient and PT discussed placing patient on hold until MD's appointment. Patient will decide whether to continue remaining 4 visits or be DC'd after MD appointment. PT in agreement.    Clinical Presentation  Stable    Clinical Decision Making  Low    Rehab Potential  Good    PT Frequency  3x / week    PT Duration  6 weeks    PT Treatment/Interventions  ADLs/Self Care Home Management;Cryotherapy;Moist Heat;Vasopneumatic Device;Taping;Dry needling;Passive range of motion;Manual techniques;Patient/family education;Therapeutic exercise;Therapeutic activities;Ultrasound;Iontophoresis 83m/ml Dexamethasone    PT Next  Visit Plan  Cont pending MD visit. PROM to improve IR. Cont AROM and strengthening exrecises, VASO ONLY    Consulted and Agree with Plan of  Care  Patient       Patient will benefit from skilled therapeutic intervention in order to improve the following deficits and impairments:  Pain, Decreased activity tolerance, Decreased range of motion, Decreased strength, Impaired UE functional use  Visit Diagnosis: Right shoulder pain, unspecified chronicity  Stiffness of right shoulder, not elsewhere classified  Muscle weakness (generalized)     Problem List Patient Active Problem List   Diagnosis Date Noted  . Closed fracture of proximal end of right humerus 09/24/17 10/17/2017  . Allergic rhinitis due to pollen 01/26/2017  . Body mass index 29.0-29.9, adult 03/02/2016  . Palpitation 07/13/2015  . Ovarian abscess due to diverticulitis s/p LSO 11/15/2013 11/16/2013  . 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 CHEST PAIN-UNSPECIFIED 07/03/2010  . CARDIAC MURMUR 05/22/2010  . Anxiety state 11/07/2008  . Complete heart block ([place]) 11/07/2008  . COLONIC POLYPS, HX OF 06/28/2008  . Hyperlipidemia 06/25/2008  . Essential hypertension 06/25/2008  . Mitral valve disorder 06/25/2008  . Cardiomyopathy ([place]) 06/25/2008  . GERD 06/25/2008  . HIATAL HERNIA 06/25/2008   PHYSICAL THERAPY DISCHARGE SUMMARY  Visits from Start of Care: 20  Current functional level related to goals / functional outcomes: See above   Remaining deficits: Goals partially met   Education / Equipment: HEP  Plan: Patient agrees to discharge.  Patient goals were partially met. Patient is being discharged due to being pleased with the current functional level.  ?????       Gabriela Eves, PT, DPT 01/04/2018, 11:03 AM  [facility] [address] [place], [place], [address] Phone: [phone]   Fax:  [phone]  Name: Carmen Green MRN: [id number] Date of Birth: [birth year]

## 2018-01-05 ENCOUNTER — Other Ambulatory Visit: Payer: Self-pay | Admitting: Physician Assistant

## 2018-01-11 ENCOUNTER — Ambulatory Visit (INDEPENDENT_AMBULATORY_CARE_PROVIDER_SITE_OTHER): Payer: Medicare Other | Admitting: Orthopedic Surgery

## 2018-01-11 ENCOUNTER — Encounter: Payer: Self-pay | Admitting: Orthopedic Surgery

## 2018-01-11 VITALS — BP 146/77 | HR 78 | Ht 61.0 in | Wt 149.0 lb

## 2018-01-11 DIAGNOSIS — S42294D Other nondisplaced fracture of upper end of right humerus, subsequent encounter for fracture with routine healing: Secondary | ICD-10-CM | POA: Diagnosis not present

## 2018-01-11 NOTE — Progress Notes (Signed)
Chief Complaint  Patient presents with  . Shoulder Pain    right improving but feels "tight"    Encounter Diagnosis  Name Primary?  . Other closed nondisplaced fracture of proximal end of right humerus with routine healing, subsequent encounter 09/24/17 Yes    14 WEEKS POST RIGHT PROXIMAL HUMERUS FRACTURE:  Her only complaint is stiffness and that involves internal rotation or forward elevation return to 150 degrees her rotator cuff strength is good no tenderness at the proximal humerus at the fracture site  Continue to work on internal rotation  Released

## 2018-01-16 ENCOUNTER — Ambulatory Visit (INDEPENDENT_AMBULATORY_CARE_PROVIDER_SITE_OTHER): Payer: Medicare Other | Admitting: *Deleted

## 2018-01-16 DIAGNOSIS — I442 Atrioventricular block, complete: Secondary | ICD-10-CM | POA: Diagnosis not present

## 2018-01-16 NOTE — Progress Notes (Signed)
Remote pacemaker transmission.   

## 2018-01-17 ENCOUNTER — Encounter: Payer: Self-pay | Admitting: Cardiology

## 2018-01-31 ENCOUNTER — Encounter: Payer: Self-pay | Admitting: Nurse Practitioner

## 2018-01-31 ENCOUNTER — Ambulatory Visit: Payer: Medicare Other | Admitting: Nurse Practitioner

## 2018-01-31 VITALS — BP 121/67 | HR 86 | Temp 97.3°F | Ht 61.0 in | Wt 150.0 lb

## 2018-01-31 DIAGNOSIS — H8303 Labyrinthitis, bilateral: Secondary | ICD-10-CM

## 2018-01-31 DIAGNOSIS — J0110 Acute frontal sinusitis, unspecified: Secondary | ICD-10-CM | POA: Diagnosis not present

## 2018-01-31 MED ORDER — MECLIZINE HCL 25 MG PO TABS: 25.0000 mg | ORAL_TABLET | Freq: Three times a day (TID) | ORAL | 0 refills | Status: DC | PRN

## 2018-01-31 MED ORDER — PREDNISONE 20 MG PO TABS: ORAL_TABLET | ORAL | 0 refills | Status: DC

## 2018-01-31 NOTE — Patient Instructions (Signed)
1. Take meds as prescribed 2. Use a cool mist humidifier especially during the winter months and when heat has been humid. 3. Use saline nose sprays frequently 4. Saline irrigations of the nose can be very helpful if done frequently.  * 4X daily for 1 week*  * Use of a nettie pot can be helpful with this. Follow directions with this* 5. Drink plenty of fluids 6. Keep thermostat turn down low 7.For any cough or congestion  Use plain Mucinex-D  Labyrinthitis Labyrinthitis is an infection of the inner ear. Your inner ear is a system of tubes and canals (labyrinth). These are filled with fluid. Nerve cells in your inner ear send signals for hearing and balance to your brain. When tiny germs get inside the tubes and canals, they harm the cells that send messages to the brain. This can cause changes in hearing and balance. Follow these instructions at home:  Take medicines only as told by your doctor.  If you were prescribed an antibiotic medicine, finish all of it even if you start to feel better.  Rest as much as possible.  Avoid loud noises and bright lights.  Do not make sudden movements until any dizziness goes away.  Do not drive until your doctor says that you can.  Drink enough fluid to keep your pee (urine) clear or pale yellow.  Work with a physical therapist if you still feel dizzy after several weeks. A therapist can teach you exercises to help you deal with your dizziness.  Keep all follow-up visits as told by your doctor. This is important. Contact a doctor if:  Your symptoms do not get better with medicines.  You do not get better after two weeks.  You have a fever. Get help right away if:  You are very dizzy.  You keep throwing up (vomiting) or keep feeling sick to your stomach (nauseous).  Your hearing gets a lot worse very quickly. This information is not intended to replace advice given to you by your health care provider. Make sure you discuss any questions  you have with your health care provider. Document Released: 06/07/2005 Document Revised: 11/13/2015 Document Reviewed: 03/19/2014 Elsevier Interactive Patient Education  2018 [place]- consult with Pharmacist for dosing 8. For fever or aces or pains- take tylenol or ibuprofen appropriate for age and weight.  * for fevers greater than 101 orally you may alternate ibuprofen and tylenol every  3 hours.

## 2018-01-31 NOTE — Progress Notes (Signed)
Subjective:    Patient ID: Carmen Green, female    DOB: 12-25-1950, 67 y.o.   MRN: [id number]   Chief Complaint: Dizziness (With movement only since Sunday) and Sinus Problem (popping in left ear for several weeks)   HPI Patient comes in today c/o dizziness. Started Sunday. Mainly when she is moving. She has some head congestion with popping in her left ear for several weeks.    Review of Systems  Constitutional: Negative for appetite change, chills and fever.  HENT: Positive for congestion and sinus pressure. Negative for ear pain (popping in left ear), sore throat and trouble swallowing.   Respiratory: Positive for cough (on in morning to clear thraot).   Cardiovascular: Negative.   Gastrointestinal: Negative.   Genitourinary: Negative.   Neurological: Positive for dizziness. Negative for headaches.  Psychiatric/Behavioral: Negative.   All other systems reviewed and are negative.      Objective:   Physical Exam  Constitutional: She appears well-developed and well-nourished. She appears distressed (mild).  HENT:  Right Ear: Tympanic membrane, external ear and ear canal normal.  Left Ear: Tympanic membrane, external ear and ear canal normal.  Nose: Right sinus exhibits frontal sinus tenderness. Left sinus exhibits frontal sinus tenderness.  Mouth/Throat: Uvula is midline, oropharynx is clear and moist and mucous membranes are normal.  Eyes: Pupils are equal, round, and reactive to light.  Neck: Normal range of motion. Neck supple.  Cardiovascular: Normal rate.  Pulmonary/Chest: Effort normal.  Lymphadenopathy:    She has no cervical adenopathy.  Neurological: She is alert.  Dizziness when goes from sitting to standing  Skin: Skin is warm and dry.  Psychiatric: She has a normal mood and affect. Her behavior is normal. Thought content normal.  Nursing note and vitals reviewed.   BP 121/67   Pulse 86   Temp (!) 97.3 F (36.3 C) (Oral)   Ht 5\' 1"  (1.549 m)   Wt  150 lb (68 kg)   BMI 28.34 kg/m        Assessment & Plan:   Carmen Green in today with chief complaint of Dizziness (With movement only since Sunday) and Sinus Problem (popping in left ear for several weeks)   1. Labyrinthitis of both ears - Meds ordered this encounter  Medications  . predniSONE (DELTASONE) 20 MG tablet    Sig: 2 po at sametime daily for 5 days    Dispense:  10 tablet    Refill:  0    Order Specific Question:   Supervising Provider    Answer:   VINCENT, CAROL L [[id number]]  . meclizine (ANTIVERT) 25 MG tablet    Sig: Take 1 tablet (25 mg total) by mouth 3 (three) times daily as needed for dizziness.    Dispense:  30 tablet    Refill:  0    Order Specific Question:   Supervising Provider    Answer:   VINCENT, CAROL L [[id number]]     2. Acute frontal sinusitis, recurrence not specified 1. Take meds as prescribed 2. Use a cool mist humidifier especially during the winter months and when heat has been humid. 3. Use saline nose sprays frequently 4. Saline irrigations of the nose can be very helpful if done frequently.  * 4X daily for 1 week*  * Use of a nettie pot can be helpful with this. Follow directions with this* 5. Drink plenty of fluids 6. Keep thermostat turn down low 7.For any cough or congestion  Use plain Mucinex  D   * Children- consult with Pharmacist for dosing 8. For fever or aces or pains- take tylenol or ibuprofen appropriate for age and weight.  * for fevers greater than 101 orally you may alternate ibuprofen and tylenol every  3 hours.   Mary-Margaret Hassell Done, FNP

## 2018-02-18 LAB — CUP PACEART REMOTE DEVICE CHECK
Battery Remaining Longevity: 62 mo
Battery Remaining Percentage: 46 %
Battery Voltage: 2.86 V
Brady Statistic AS VP Percent: 94 %
Brady Statistic RV Percent Paced: 95 %
Date Time Interrogation Session: 20190729060515
Implantable Lead Implant Date: 19920319
Implantable Lead Implant Date: 19920319
Implantable Pulse Generator Implant Date: 20120803
Lead Channel Sensing Intrinsic Amplitude: 1.6 mV
Lead Channel Setting Pacing Amplitude: 1.875
MDC IDC LEAD LOCATION: 753859
MDC IDC LEAD LOCATION: 753860
MDC IDC MSMT LEADCHNL RA IMPEDANCE VALUE: 440 Ohm
MDC IDC MSMT LEADCHNL RV IMPEDANCE VALUE: 540 Ohm
MDC IDC MSMT LEADCHNL RV PACING THRESHOLD AMPLITUDE: 1.625 V
MDC IDC MSMT LEADCHNL RV PACING THRESHOLD PULSEWIDTH: 0.5 ms
MDC IDC MSMT LEADCHNL RV SENSING INTR AMPL: 7.5 mV
MDC IDC SET LEADCHNL RV PACING PULSEWIDTH: 0.5 ms
MDC IDC SET LEADCHNL RV SENSING SENSITIVITY: 4 mV
MDC IDC STAT BRADY AS VS PERCENT: 2.3 %
Pulse Gen Model: 2210
Pulse Gen Serial Number: 7252855

## 2018-02-21 ENCOUNTER — Telehealth: Payer: Self-pay | Admitting: Physician Assistant

## 2018-02-21 ENCOUNTER — Other Ambulatory Visit: Payer: Self-pay | Admitting: Physician Assistant

## 2018-02-21 MED ORDER — FLUCONAZOLE 150 MG PO TABS: ORAL_TABLET | ORAL | 0 refills | Status: DC

## 2018-02-21 NOTE — Telephone Encounter (Signed)
What symptoms do you have? Itching and burning   How long have you been sick? Pt has been using otC for over a week and nothing has helped  Have you been seen for this problem? Yes  If your provider decides to give you a prescription, which pharmacy would you like for it to be sent to? [facility]   Patient informed that this information will be sent to the clinical staff for review and that they should receive a follow up call.

## 2018-02-21 NOTE — Telephone Encounter (Signed)
Sent diflucan

## 2018-02-21 NOTE — Telephone Encounter (Signed)
Patient aware.

## 2018-02-27 ENCOUNTER — Other Ambulatory Visit: Payer: Self-pay | Admitting: Physician Assistant

## 2018-02-27 DIAGNOSIS — F411 Generalized anxiety disorder: Secondary | ICD-10-CM

## 2018-03-02 ENCOUNTER — Other Ambulatory Visit: Payer: Self-pay | Admitting: Physician Assistant

## 2018-03-02 DIAGNOSIS — F411 Generalized anxiety disorder: Secondary | ICD-10-CM

## 2018-03-02 MED ORDER — LORAZEPAM 1 MG PO TABS: ORAL_TABLET | ORAL | 1 refills | Status: DC

## 2018-03-24 ENCOUNTER — Other Ambulatory Visit: Payer: Self-pay | Admitting: Physician Assistant

## 2018-04-07 ENCOUNTER — Encounter: Payer: Self-pay | Admitting: Physician Assistant

## 2018-04-07 ENCOUNTER — Ambulatory Visit (INDEPENDENT_AMBULATORY_CARE_PROVIDER_SITE_OTHER): Payer: Medicare Other | Admitting: Physician Assistant

## 2018-04-07 DIAGNOSIS — J301 Allergic rhinitis due to pollen: Secondary | ICD-10-CM

## 2018-04-07 DIAGNOSIS — M150 Primary generalized (osteo)arthritis: Secondary | ICD-10-CM

## 2018-04-07 DIAGNOSIS — F411 Generalized anxiety disorder: Secondary | ICD-10-CM

## 2018-04-07 DIAGNOSIS — M8949 Other hypertrophic osteoarthropathy, multiple sites: Secondary | ICD-10-CM | POA: Insufficient documentation

## 2018-04-07 DIAGNOSIS — H8303 Labyrinthitis, bilateral: Secondary | ICD-10-CM

## 2018-04-07 DIAGNOSIS — M159 Polyosteoarthritis, unspecified: Secondary | ICD-10-CM

## 2018-04-07 MED ORDER — OMEPRAZOLE 40 MG PO CPDR: DELAYED_RELEASE_CAPSULE | ORAL | 3 refills | Status: DC

## 2018-04-07 MED ORDER — LISINOPRIL-HYDROCHLOROTHIAZIDE 20-12.5 MG PO TABS: 1.0000 | ORAL_TABLET | Freq: Every morning | ORAL | 3 refills | Status: DC

## 2018-04-07 MED ORDER — FLUTICASONE PROPIONATE 50 MCG/ACT NA SUSP: 1.0000 | Freq: Every day | NASAL | 3 refills | Status: DC | PRN

## 2018-04-07 MED ORDER — MELOXICAM 7.5 MG PO TABS: 7.5000 mg | ORAL_TABLET | ORAL | 2 refills | Status: DC | PRN

## 2018-04-07 MED ORDER — ESTRADIOL 0.5 MG PO TABS: 0.5000 mg | ORAL_TABLET | Freq: Every day | ORAL | 3 refills | Status: DC

## 2018-04-07 MED ORDER — MECLIZINE HCL 25 MG PO TABS: 25.0000 mg | ORAL_TABLET | Freq: Three times a day (TID) | ORAL | 0 refills | Status: DC | PRN

## 2018-04-07 MED ORDER — LORAZEPAM 1 MG PO TABS: ORAL_TABLET | ORAL | 1 refills | Status: DC

## 2018-04-07 NOTE — Progress Notes (Signed)
BP 120/72   Pulse 89   Temp (!) 97.2 F (36.2 C) (Oral)   Ht 5\' 1"  (1.549 m)   Wt 145 lb 3.2 oz (65.9 kg)   BMI 27.44 kg/m    Subjective:    Patient ID: Carmen Green, female    DOB: [birth year], 67 y.o.   MRN: [id number]  HPI: Carmen Green is a 67 y.o. female presenting on 04/07/2018 for Hypertension (3 months)  This patient comes in for periodic recheck on medications and conditions including anxiety, anxiety, hepatitis osteoarthritis.  She has a lot of stress going on.  Her husband recently had a stroke but they have had a hard time explaining what has happened.  He is still undergoing some tests.  So therefore she is quite concerned about his.  She states that otherwise she has felt pretty good and not having any difficulties..   All medications are reviewed today. There are no reports of any problems with the medications. All of the medical conditions are reviewed and updated.  Lab work is reviewed and will be ordered as medically necessary. There are no new problems reported with today's visit.   Past Medical History:  Diagnosis Date  . Allergy   . Anemia   . Anxiety   . Arthritis   . Cataract    bil cataracts removed  . Colon polyp   . Complete heart block ([place])   . Contusion of chest wall 10/10/2007   Centricity Description: CHEST WALL CONTUSION Qualifier: Diagnosis of  By: Aline Brochure MD, Dorothyann Peng   Centricity Description: CONTUSION, RIGHT RIB Qualifier: Diagnosis of  By: Aline Brochure MD, Dorothyann Peng    . Depression   . Diverticulitis   . Esophagitis   . ESOPHAGITIS 06/25/2008   Qualifier: History of  By: Nelson-Smith CMA (AAMA), Dottie    . GERD (gastroesophageal reflux disease)   . H/O seasonal allergies   . Heart murmur   . Hiatal hernia   . 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 Hypertension   . Mitral valve prolapse   . Pacemaker    Relevant past medical, surgical, family and social  history reviewed and updated as indicated. Interim medical history since our last visit reviewed. Allergies and medications reviewed and updated. DATA REVIEWED: CHART IN EPIC  Family History reviewed for pertinent findings.  Review of Systems  Constitutional: Negative.  Negative for activity change, fatigue and fever.  HENT: Negative.   Eyes: Negative.   Respiratory: Negative.  Negative for cough.   Cardiovascular: Negative.  Negative for chest pain.  Gastrointestinal: Negative.  Negative for abdominal pain.  Endocrine: Negative.   Genitourinary: Negative.  Negative for dysuria.  Musculoskeletal: Negative.   Skin: Negative.   Neurological: Negative.     Allergies as of 04/07/2018      Reactions   Amoxapine And Related    Rash   Cephalexin Rash   Oxycodone    "Loopy"   Penicillins Rash      Medication List        Accurate as of 04/07/18  1:50 PM. Always use your most recent med list.          buPROPion 150 MG 24 hr tablet Commonly known as:  WELLBUTRIN XL TAKE 2 TABLETS BY MOUTH DAILY   cetirizine 10 MG tablet Commonly known as:  ZYRTEC Take 10 mg by mouth daily as needed for allergies.   clindamycin  150 MG capsule Commonly known as:  CLEOCIN TAKE 4 CAPSULES 1 HOUR PRIOR TO DENTAL VISIT   esomeprazole 40 MG capsule Commonly known as:  NEXIUM Take 1 capsule (40 mg total) by mouth daily at 12 noon.   estradiol 0.5 MG tablet Commonly known as:  ESTRACE Take 1 tablet (0.5 mg total) by mouth daily.   fluconazole 150 MG tablet Commonly known as:  DIFLUCAN 1 po q week x 4 weeks   fluticasone 50 MCG/ACT nasal spray Commonly known as:  FLONASE Place 1 spray into both nostrils daily as needed for allergies.   guaiFENesin 600 MG 12 hr tablet Commonly known as:  MUCINEX Take 600 mg by mouth 2 (two) times daily as needed for cough.   lisinopril-hydrochlorothiazide 20-12.5 MG tablet Commonly known as:  PRINZIDE,ZESTORETIC Take 1 tablet by mouth every  morning.   LORazepam 1 MG tablet Commonly known as:  ATIVAN TAKE 1 TABLET EVERY 8 HOURS AS NEEDED FOR ANXIETY   meclizine 25 MG tablet Commonly known as:  ANTIVERT Take 1 tablet (25 mg total) by mouth 3 (three) times daily as needed for dizziness.   medroxyPROGESTERone 2.5 MG tablet Commonly known as:  PROVERA Take 1 tablet (2.5 mg total) by mouth daily.   meloxicam 7.5 MG tablet Commonly known as:  MOBIC Take 1 tablet (7.5 mg total) by mouth as needed.   multivitamin tablet Take 1 tablet by mouth daily.   omeprazole 40 MG capsule Commonly known as:  PRILOSEC TAKE (1) CAPSULE DAILY   polyethylene glycol powder powder Commonly known as:  GLYCOLAX/MIRALAX Take 1 Container by mouth as needed for mild constipation.   ranitidine 150 MG tablet Commonly known as:  ZANTAC Take 1 tablet (150 mg total) by mouth at bedtime.   Vitamin D3 1000 units Caps Take 1 capsule by mouth daily.          Objective:    BP 120/72   Pulse 89   Temp (!) 97.2 F (36.2 C) (Oral)   Ht 5\' 1"  (1.549 m)   Wt 145 lb 3.2 oz (65.9 kg)   BMI 27.44 kg/m   Allergies  Allergen Reactions  . Amoxapine And Related     Rash  . Cephalexin Rash  . Oxycodone     "Loopy"  . Penicillins Rash    Wt Readings from Last 3 Encounters:  04/07/18 145 lb 3.2 oz (65.9 kg)  01/31/18 150 lb (68 kg)  01/11/18 149 lb (67.6 kg)    Physical Exam  Constitutional: She is oriented to person, place, and time. She appears well-developed and well-nourished.  HENT:  Head: Normocephalic and atraumatic.  Eyes: Pupils are equal, round, and reactive to light. Conjunctivae and EOM are normal.  Cardiovascular: Normal rate, regular rhythm, normal heart sounds and intact distal pulses.  Pulmonary/Chest: Effort normal and breath sounds normal.  Abdominal: Soft. Bowel sounds are normal.  Neurological: She is alert and oriented to person, place, and time. She has normal reflexes.  Skin: Skin is warm and dry. No rash noted.   Psychiatric: She has a normal mood and affect. Her behavior is normal. Judgment and thought content normal.        Assessment & Plan:   1. Allergic rhinitis due to pollen, unspecified seasonality - fluticasone (FLONASE) 50 MCG/ACT nasal spray; Place 1 spray into both nostrils daily as needed for allergies.  Dispense: 48 g; Refill: 3  2. Anxiety state - LORazepam (ATIVAN) 1 MG tablet; TAKE 1 TABLET EVERY 8 HOURS  AS NEEDED FOR ANXIETY  Dispense: 180 tablet; Refill: 1  3. Labyrinthitis of both ears - meclizine (ANTIVERT) 25 MG tablet; Take 1 tablet (25 mg total) by mouth 3 (three) times daily as needed for dizziness.  Dispense: 30 tablet; Refill: 0  4. Primary osteoarthritis involving multiple joints - meloxicam (MOBIC) 7.5 MG tablet; Take 1 tablet (7.5 mg total) by mouth as needed.  Dispense: 90 tablet; Refill: 2   Continue all other maintenance medications as listed above.  Follow up plan: No follow-ups on file.  Educational handout given for [place] PA-C [place] [address]  [place], [place] [address] [phone]   04/07/2018, 1:51 PM

## 2018-04-17 ENCOUNTER — Ambulatory Visit (INDEPENDENT_AMBULATORY_CARE_PROVIDER_SITE_OTHER): Payer: Medicare Other | Admitting: *Deleted

## 2018-04-17 DIAGNOSIS — I442 Atrioventricular block, complete: Secondary | ICD-10-CM

## 2018-04-17 NOTE — Progress Notes (Signed)
Remote pacemaker transmission.   

## 2018-04-26 ENCOUNTER — Ambulatory Visit (INDEPENDENT_AMBULATORY_CARE_PROVIDER_SITE_OTHER): Payer: Medicare Other

## 2018-04-26 ENCOUNTER — Telehealth: Payer: Self-pay | Admitting: Physician Assistant

## 2018-04-26 DIAGNOSIS — Z23 Encounter for immunization: Secondary | ICD-10-CM | POA: Diagnosis not present

## 2018-04-26 NOTE — Telephone Encounter (Signed)
Yes, okay to take.

## 2018-04-26 NOTE — Telephone Encounter (Signed)
Patient aware.

## 2018-04-26 NOTE — Telephone Encounter (Signed)
Pt is wanting to if it is okay for to get her flu shot now, she was advised by AJ to wait since spouse just had stroke.

## 2018-05-24 ENCOUNTER — Encounter: Payer: Self-pay | Admitting: Physician Assistant

## 2018-05-24 ENCOUNTER — Ambulatory Visit: Payer: Medicare Other | Admitting: Physician Assistant

## 2018-05-24 VITALS — BP 151/84 | HR 61 | Temp 99.0°F | Ht 61.0 in | Wt 146.2 lb

## 2018-05-24 DIAGNOSIS — F331 Major depressive disorder, recurrent, moderate: Secondary | ICD-10-CM

## 2018-05-24 DIAGNOSIS — F411 Generalized anxiety disorder: Secondary | ICD-10-CM | POA: Diagnosis not present

## 2018-05-24 MED ORDER — SERTRALINE HCL 50 MG PO TABS: 50.0000 mg | ORAL_TABLET | Freq: Every day | ORAL | 5 refills | Status: DC

## 2018-05-25 NOTE — Progress Notes (Signed)
BP (!) 151/84   Pulse 61   Temp 99 F (37.2 C) (Oral)   Ht 5\' 1"  (1.549 m)   Wt 146 lb 3.2 oz (66.3 kg)   BMI 27.62 kg/m    Subjective:    Patient ID: Carmen Green, female    DOB: [birth year], 67 y.o.   MRN: [id number]  HPI: Carmen Green is a 67 y.o. female presenting on 05/24/2018 for Anxiety  The patient comes in for recheck on her depression anxiety.   She has had problems with depression anxiety for many years.  She has had some panic attacks in the past.  At this time she is just having a pervasive worry and having difficulty with sleep.  Her husband did have a stroke earlier in the fall and that has made a big life change for them.  And just this week they found out that the vision in his thigh would probably not come back at all.  He was quite devastating to them.  She does have a daughter still at home with special needs.  I do think it would be very appropriate for her to have another medication to help with the generalized anxiety.    Past Medical History:  Diagnosis Date  . Allergy   . Anemia   . Anxiety   . Arthritis   . Cataract    bil cataracts removed  . Colon polyp   . Complete heart block ([place])   . Contusion of chest wall 10/10/2007   Centricity Description: CHEST WALL CONTUSION Qualifier: Diagnosis of  By: Aline Brochure MD, Dorothyann Peng   Centricity Description: CONTUSION, RIGHT RIB Qualifier: Diagnosis of  By: Aline Brochure MD, Dorothyann Peng    . Depression   . Diverticulitis   . Esophagitis   . ESOPHAGITIS 06/25/2008   Qualifier: History of  By: Nelson-Smith CMA (AAMA), Dottie    . GERD (gastroesophageal reflux disease)   . H/O seasonal allergies   . Heart murmur   . Hiatal hernia   . 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 Hypertension   . Mitral valve prolapse   . Pacemaker    Relevant past medical, surgical, family and social history reviewed and updated as indicated. Interim medical  history since our last visit reviewed. Allergies and medications reviewed and updated. DATA REVIEWED: CHART IN EPIC  Family History reviewed for pertinent findings.  Review of Systems  Constitutional: Positive for fatigue. Negative for activity change and fever.  HENT: Negative.   Eyes: Negative.   Respiratory: Negative.  Negative for cough.   Cardiovascular: Negative.  Negative for chest pain.  Gastrointestinal: Negative.  Negative for abdominal pain.  Endocrine: Negative.   Genitourinary: Negative.  Negative for dysuria.  Musculoskeletal: Negative.   Skin: Negative.   Neurological: Negative.   Psychiatric/Behavioral: Positive for decreased concentration and sleep disturbance. Negative for self-injury and suicidal ideas. The patient is nervous/anxious.     Allergies as of 05/24/2018      Reactions   Amoxapine And Related    Rash   Cephalexin Rash   Oxycodone    "Loopy"   Penicillins Rash      Medication List        Accurate as of 05/24/18 11:59 PM. Always use your most recent med list.          buPROPion 150 MG 24 hr tablet Commonly known as:  WELLBUTRIN XL TAKE 2  TABLETS BY MOUTH DAILY   cetirizine 10 MG tablet Commonly known as:  ZYRTEC Take 10 mg by mouth daily as needed for allergies.   clindamycin 150 MG capsule Commonly known as:  CLEOCIN TAKE 4 CAPSULES 1 HOUR PRIOR TO DENTAL VISIT   esomeprazole 40 MG capsule Commonly known as:  NEXIUM Take 1 capsule (40 mg total) by mouth daily at 12 noon.   estradiol 0.5 MG tablet Commonly known as:  ESTRACE Take 1 tablet (0.5 mg total) by mouth daily.   fluticasone 50 MCG/ACT nasal spray Commonly known as:  FLONASE Place 1 spray into both nostrils daily as needed for allergies.   guaiFENesin 600 MG 12 hr tablet Commonly known as:  MUCINEX Take 600 mg by mouth 2 (two) times daily as needed for cough.   lisinopril-hydrochlorothiazide 20-12.5 MG tablet Commonly known as:  PRINZIDE,ZESTORETIC Take 1 tablet by  mouth every morning.   LORazepam 1 MG tablet Commonly known as:  ATIVAN TAKE 1 TABLET EVERY 8 HOURS AS NEEDED FOR ANXIETY   meclizine 25 MG tablet Commonly known as:  ANTIVERT Take 1 tablet (25 mg total) by mouth 3 (three) times daily as needed for dizziness.   medroxyPROGESTERone 2.5 MG tablet Commonly known as:  PROVERA Take 1 tablet (2.5 mg total) by mouth daily.   meloxicam 7.5 MG tablet Commonly known as:  MOBIC Take 1 tablet (7.5 mg total) by mouth as needed.   multivitamin tablet Take 1 tablet by mouth daily.   omeprazole 40 MG capsule Commonly known as:  PRILOSEC TAKE (1) CAPSULE DAILY   polyethylene glycol powder powder Commonly known as:  GLYCOLAX/MIRALAX Take 1 Container by mouth as needed for mild constipation.   ranitidine 150 MG tablet Commonly known as:  ZANTAC Take 1 tablet (150 mg total) by mouth at bedtime.   sertraline 50 MG tablet Commonly known as:  ZOLOFT Take 1 tablet (50 mg total) by mouth daily.   Vitamin D3 25 MCG (1000 UT) Caps Take 1 capsule by mouth daily.          Objective:    BP (!) 151/84   Pulse 61   Temp 99 F (37.2 C) (Oral)   Ht 5\' 1"  (1.549 m)   Wt 146 lb 3.2 oz (66.3 kg)   BMI 27.62 kg/m   Allergies  Allergen Reactions  . Amoxapine And Related     Rash  . Cephalexin Rash  . Oxycodone     "Loopy"  . Penicillins Rash    Wt Readings from Last 3 Encounters:  05/24/18 146 lb 3.2 oz (66.3 kg)  04/07/18 145 lb 3.2 oz (65.9 kg)  01/31/18 150 lb (68 kg)    Physical Exam  Constitutional: She is oriented to person, place, and time. She appears well-developed and well-nourished.  HENT:  Head: Normocephalic and atraumatic.  Eyes: Pupils are equal, round, and reactive to light. Conjunctivae and EOM are normal.  Cardiovascular: Normal rate, regular rhythm, normal heart sounds and intact distal pulses.  Pulmonary/Chest: Effort normal and breath sounds normal.  Abdominal: Soft. Bowel sounds are normal.  Neurological:  She is alert and oriented to person, place, and time. She has normal reflexes.  Skin: Skin is warm and dry. No rash noted.  Psychiatric: Her speech is normal and behavior is normal. Judgment and thought content normal. Her mood appears anxious. Cognition and memory are normal. She exhibits a depressed mood.        Assessment & Plan:   1. Moderate episode  of recurrent major depressive disorder (HCC) - sertraline (ZOLOFT) 50 MG tablet; Take 1 tablet (50 mg total) by mouth daily.  Dispense: 30 tablet; Refill: 5  2. Anxiety state - sertraline (ZOLOFT) 50 MG tablet; Take 1 tablet (50 mg total) by mouth daily.  Dispense: 30 tablet; Refill: 5   Continue all other maintenance medications as listed above.  Follow up plan: Return in about 4 weeks (around 06/21/2018).  Educational handout given for [place] PA-C [place] [address]  [place], [place] [address] [phone]   05/25/2018, 1:58 PM

## 2018-06-20 LAB — CUP PACEART REMOTE DEVICE CHECK
Battery Remaining Longevity: 46 mo
Battery Remaining Percentage: 35 %
Battery Voltage: 2.83 V
Brady Statistic AS VP Percent: 95 %
Brady Statistic AS VS Percent: 2.5 %
Brady Statistic RV Percent Paced: 95 %
Date Time Interrogation Session: 20191028063718
Implantable Lead Implant Date: 19920319
Implantable Lead Implant Date: 19920319
Implantable Pulse Generator Implant Date: 20120803
Lead Channel Impedance Value: 410 Ohm
Lead Channel Impedance Value: 510 Ohm
Lead Channel Pacing Threshold Pulse Width: 0.5 ms
Lead Channel Sensing Intrinsic Amplitude: 2 mV
Lead Channel Sensing Intrinsic Amplitude: 6.8 mV
MDC IDC LEAD LOCATION: 753859
MDC IDC LEAD LOCATION: 753860
MDC IDC MSMT LEADCHNL RV PACING THRESHOLD AMPLITUDE: 1.75 V
MDC IDC SET LEADCHNL RV PACING AMPLITUDE: 2 V
MDC IDC SET LEADCHNL RV PACING PULSEWIDTH: 0.5 ms
MDC IDC SET LEADCHNL RV SENSING SENSITIVITY: 4 mV
Pulse Gen Model: 2210
Pulse Gen Serial Number: 7252855

## 2018-06-27 ENCOUNTER — Encounter: Payer: Self-pay | Admitting: Physician Assistant

## 2018-06-27 ENCOUNTER — Ambulatory Visit: Payer: Medicare Other | Admitting: Physician Assistant

## 2018-06-27 VITALS — BP 146/73 | HR 70 | Temp 99.3°F | Ht 61.0 in | Wt 146.0 lb

## 2018-06-27 DIAGNOSIS — J301 Allergic rhinitis due to pollen: Secondary | ICD-10-CM

## 2018-06-27 DIAGNOSIS — G44209 Tension-type headache, unspecified, not intractable: Secondary | ICD-10-CM

## 2018-06-27 DIAGNOSIS — F331 Major depressive disorder, recurrent, moderate: Secondary | ICD-10-CM

## 2018-06-27 DIAGNOSIS — F411 Generalized anxiety disorder: Secondary | ICD-10-CM | POA: Diagnosis not present

## 2018-06-27 MED ORDER — FLUTICASONE PROPIONATE 50 MCG/ACT NA SUSP: 1.0000 | Freq: Every day | NASAL | 3 refills | Status: DC | PRN

## 2018-06-27 MED ORDER — CYCLOBENZAPRINE HCL 10 MG PO TABS: 10.0000 mg | ORAL_TABLET | Freq: Three times a day (TID) | ORAL | 0 refills | Status: DC | PRN

## 2018-06-27 MED ORDER — SERTRALINE HCL 100 MG PO TABS: 100.0000 mg | ORAL_TABLET | Freq: Every day | ORAL | 5 refills | Status: DC

## 2018-06-27 NOTE — Progress Notes (Signed)
BP (!) 146/73   Pulse 70   Temp 99.3 F (37.4 C) (Oral)   Ht 5\' 1"  (1.549 m)   Wt 146 lb (66.2 kg)   BMI 27.59 kg/m    Subjective:    Patient ID: Carmen Green, female    DOB: 07/30/1950, 68 y.o.   MRN: [id number]  HPI: Carmen Green is a 68 y.o. female presenting on 06/27/2018 for Depression (1 month follow up )  This patient comes in for periodic recheck on medications and conditions including depression.  Depression screen [facility] 2/9 06/27/2018 05/24/2018 04/07/2018 01/31/2018 08/17/2017  Decreased Interest 1 0 0 0 0  Down, Depressed, Hopeless 0 0 0 0 0  PHQ - 2 Score 1 0 0 0 0  Altered sleeping - - - - -  Tired, decreased energy - - - - -  Change in appetite - - - - -  Feeling bad or failure about yourself  - - - - -  Trouble concentrating - - - - -  Moving slowly or fidgety/restless - - - - -  Suicidal thoughts - - - - -  PHQ-9 Score - - - - -     All medications are reviewed today. There are no reports of any problems with the medications. All of the medical conditions are reviewed and updated.  Lab work is reviewed and will be ordered as medically necessary. There are no new problems reported with today's visit.   Past Medical History:  Diagnosis Date  . Allergy   . Anemia   . Anxiety   . Arthritis   . Cataract    bil cataracts removed  . Colon polyp   . Complete heart block ([place])   . Contusion of chest wall 10/10/2007   Centricity Description: CHEST WALL CONTUSION Qualifier: Diagnosis of  By: Aline Brochure MD, Dorothyann Peng   Centricity Description: CONTUSION, RIGHT RIB Qualifier: Diagnosis of  By: Aline Brochure MD, Dorothyann Peng    . Depression   . Diverticulitis   . Esophagitis   . ESOPHAGITIS 06/25/2008   Qualifier: History of  By: Nelson-Smith CMA (AAMA), Dottie    . GERD (gastroesophageal reflux disease)   . H/O seasonal allergies   . Heart murmur   . Hiatal hernia   . Hyperlipidemia    s/p PPM implant by Dr Olevia Perches 1992 with generator change 2004 (SJM), she is device  dependant  . Hypertension   . Mitral valve prolapse   . Pacemaker    Relevant past medical, surgical, family and social history reviewed and updated as indicated. Interim medical history since our last visit reviewed. Allergies and medications reviewed and updated. DATA REVIEWED: CHART IN EPIC  Family History reviewed for pertinent findings.  Review of Systems  Constitutional: Negative.   HENT: Negative.   Eyes: Negative.   Respiratory: Negative.   Gastrointestinal: Negative.   Genitourinary: Negative.     Allergies as of 06/27/2018      Reactions   Amoxapine And Related    Rash   Cephalexin Rash   Oxycodone    "Loopy"   Penicillins Rash      Medication List       Accurate as of June 27, 2018 11:14 AM. Always use your most recent med list.        buPROPion 150 MG 24 hr tablet Commonly known as:  WELLBUTRIN XL TAKE 2 TABLETS BY MOUTH DAILY   cetirizine 10 MG tablet Commonly known as:  ZYRTEC Take 10 mg by mouth  daily as needed for allergies.   cyclobenzaprine 10 MG tablet Commonly known as:  FLEXERIL Take 1 tablet (10 mg total) by mouth 3 (three) times daily as needed for muscle spasms.   esomeprazole 40 MG capsule Commonly known as:  NEXIUM Take 1 capsule (40 mg total) by mouth daily at 12 noon.   estradiol 0.5 MG tablet Commonly known as:  ESTRACE Take 1 tablet (0.5 mg total) by mouth daily.   fluticasone 50 MCG/ACT nasal spray Commonly known as:  FLONASE Place 1 spray into both nostrils daily as needed for allergies.   guaiFENesin 600 MG 12 hr tablet Commonly known as:  MUCINEX Take 600 mg by mouth 2 (two) times daily as needed for cough.   lisinopril-hydrochlorothiazide 20-12.5 MG tablet Commonly known as:  PRINZIDE,ZESTORETIC Take 1 tablet by mouth every morning.   LORazepam 1 MG tablet Commonly known as:  ATIVAN TAKE 1 TABLET EVERY 8 HOURS AS NEEDED FOR ANXIETY   meclizine 25 MG tablet Commonly known as:  ANTIVERT Take 1 tablet (25 mg  total) by mouth 3 (three) times daily as needed for dizziness.   medroxyPROGESTERone 2.5 MG tablet Commonly known as:  PROVERA Take 1 tablet (2.5 mg total) by mouth daily.   meloxicam 7.5 MG tablet Commonly known as:  MOBIC Take 1 tablet (7.5 mg total) by mouth as needed.   multivitamin tablet Take 1 tablet by mouth daily.   omeprazole 40 MG capsule Commonly known as:  PRILOSEC TAKE (1) CAPSULE DAILY   polyethylene glycol powder powder Commonly known as:  GLYCOLAX/MIRALAX Take 1 Container by mouth as needed for mild constipation.   ranitidine 150 MG tablet Commonly known as:  ZANTAC Take 1 tablet (150 mg total) by mouth at bedtime.   sertraline 100 MG tablet Commonly known as:  ZOLOFT Take 1 tablet (100 mg total) by mouth daily.   Vitamin D3 25 MCG (1000 UT) Caps Take 1 capsule by mouth daily.          Objective:    BP (!) 146/73   Pulse 70   Temp 99.3 F (37.4 C) (Oral)   Ht 5\' 1"  (1.549 m)   Wt 146 lb (66.2 kg)   BMI 27.59 kg/m   Allergies  Allergen Reactions  . Amoxapine And Related     Rash  . Cephalexin Rash  . Oxycodone     "Loopy"  . Penicillins Rash    Wt Readings from Last 3 Encounters:  06/27/18 146 lb (66.2 kg)  05/24/18 146 lb 3.2 oz (66.3 kg)  04/07/18 145 lb 3.2 oz (65.9 kg)    Physical Exam Constitutional:      Appearance: She is well-developed.  HENT:     Head: Normocephalic and atraumatic.  Eyes:     Conjunctiva/sclera: Conjunctivae normal.     Pupils: Pupils are equal, round, and reactive to light.  Cardiovascular:     Rate and Rhythm: Normal rate and regular rhythm.     Heart sounds: Normal heart sounds.  Pulmonary:     Effort: Pulmonary effort is normal.     Breath sounds: Normal breath sounds.  Abdominal:     General: Bowel sounds are normal.     Palpations: Abdomen is soft.  Skin:    General: Skin is warm and dry.     Findings: No rash.  Neurological:     Mental Status: She is alert and oriented to person, place,  and time.     Deep Tendon Reflexes: Reflexes are  normal and symmetric.  Psychiatric:        Behavior: Behavior normal.        Thought Content: Thought content normal.        Judgment: Judgment normal.         Assessment & Plan:   1. Moderate episode of recurrent major depressive disorder (HCC) - sertraline (ZOLOFT) 100 MG tablet; Take 1 tablet (100 mg total) by mouth daily.  Dispense: 30 tablet; Refill: 5  2. Anxiety state - sertraline (ZOLOFT) 100 MG tablet; Take 1 tablet (100 mg total) by mouth daily.  Dispense: 30 tablet; Refill: 5  3. Allergic rhinitis due to pollen, unspecified seasonality - fluticasone (FLONASE) 50 MCG/ACT nasal spray; Place 1 spray into both nostrils daily as needed for allergies.  Dispense: 48 g; Refill: 3  4. Tension headache - cyclobenzaprine (FLEXERIL) 10 MG tablet; Take 1 tablet (10 mg total) by mouth 3 (three) times daily as needed for muscle spasms.  Dispense: 30 tablet; Refill: 0   Continue all other maintenance medications as listed above.  Follow up plan: Return in about 3 months (around 09/26/2018) for recheck.  Educational handout given for [place] PA-C [place] [address]  [place], [place] [address] [phone]   06/27/2018, 11:14 AM

## 2018-07-17 ENCOUNTER — Ambulatory Visit (INDEPENDENT_AMBULATORY_CARE_PROVIDER_SITE_OTHER): Payer: Medicare Other

## 2018-07-17 DIAGNOSIS — I442 Atrioventricular block, complete: Secondary | ICD-10-CM | POA: Diagnosis not present

## 2018-07-18 ENCOUNTER — Other Ambulatory Visit: Payer: Self-pay | Admitting: Physician Assistant

## 2018-07-18 DIAGNOSIS — Z1231 Encounter for screening mammogram for malignant neoplasm of breast: Secondary | ICD-10-CM

## 2018-07-18 NOTE — Progress Notes (Signed)
Remote pacemaker transmission.   

## 2018-07-20 LAB — CUP PACEART REMOTE DEVICE CHECK
Brady Statistic AS VP Percent: 95 %
Brady Statistic AS VS Percent: 2.4 %
Brady Statistic RV Percent Paced: 95 %
Implantable Lead Implant Date: 19920319
Implantable Lead Implant Date: 19920319
Implantable Lead Location: 753859
Implantable Lead Location: 753860
Lead Channel Impedance Value: 540 Ohm
Lead Channel Sensing Intrinsic Amplitude: 1.7 mV
Lead Channel Sensing Intrinsic Amplitude: 9.2 mV
MDC IDC MSMT BATTERY REMAINING LONGEVITY: 40 mo
MDC IDC MSMT BATTERY REMAINING PERCENTAGE: 29 %
MDC IDC MSMT BATTERY VOLTAGE: 2.81 V
MDC IDC MSMT LEADCHNL RA IMPEDANCE VALUE: 490 Ohm
MDC IDC MSMT LEADCHNL RV PACING THRESHOLD AMPLITUDE: 1.5 V
MDC IDC MSMT LEADCHNL RV PACING THRESHOLD PULSEWIDTH: 0.5 ms
MDC IDC PG IMPLANT DT: 20120803
MDC IDC SESS DTM: 20200127070646
MDC IDC SET LEADCHNL RV PACING AMPLITUDE: 1.75 V
MDC IDC SET LEADCHNL RV PACING PULSEWIDTH: 0.5 ms
MDC IDC SET LEADCHNL RV SENSING SENSITIVITY: 4 mV
Pulse Gen Model: 2210
Pulse Gen Serial Number: 7252855

## 2018-08-03 NOTE — Progress Notes (Signed)
Electrophysiology Office Note Date: 08/04/2018  ID:  Carmen Green, DOB 21-Apr-1951, MRN [id number]  PCP: Terald Sleeper, PA-C Electrophysiologist: Rayann Heman  CC: Pacemaker follow-up  Carmen Green is a 68 y.o. female seen today for Dr Rayann Heman.  She presents today for routine electrophysiology followup.  Since last being seen in our clinic, the patient reports doing very well.  She denies chest pain, palpitations, dyspnea, PND, orthopnea, nausea, vomiting, dizziness, syncope, edema, weight gain, or early satiety.  Device History: STJ dual chamber PPM implanted 1992 for complete heart block; gen change 2004; gen change 2012   Past Medical History:  Diagnosis Date  . Allergy   . Anemia   . Anxiety   . Arthritis   . Cataract    bil cataracts removed  . Colon polyp   . Complete heart block ([place])   . Contusion of chest wall 10/10/2007   Centricity Description: CHEST WALL CONTUSION Qualifier: Diagnosis of  By: Aline Brochure MD, Dorothyann Peng   Centricity Description: CONTUSION, RIGHT RIB Qualifier: Diagnosis of  By: Aline Brochure MD, Dorothyann Peng    . Depression   . Diverticulitis   . Esophagitis   . ESOPHAGITIS 06/25/2008   Qualifier: History of  By: Nelson-Smith CMA (AAMA), Dottie    . GERD (gastroesophageal reflux disease)   . H/O seasonal allergies   . Heart murmur   . Hiatal hernia   . 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 Hypertension   . Mitral valve prolapse   . Pacemaker    Past Surgical History:  Procedure Laterality Date  . APPENDECTOMY    . BREAST SURGERY Right    biopsy  . COLONOSCOPY  10/19/2004   LEC  . EYE SURGERY Left    cataract extraction with IOL  . LAPAROSCOPIC LOW ANTERIOR RESECTION  11/16/2013   Robotic  . LAPAROSCOPIC LYSIS OF ADHESIONS N/A 11/15/2013   Procedure: LAPAROSCOPIC LYSIS OF ADHESIONS;  Surgeon: Adin Hector, MD;  Location: [facility];  Service: General;  Laterality: N/A;  . left  ovary cyst removal  1972  . [place], 2004, 2012   device dependant  . POLYPECTOMY    . PROCTOSCOPY  11/15/2013   Procedure: RIGID PROCTOSCOPY;  Surgeon: Adin Hector, MD;  Location: [facility];  Service: General;;  . ROBOTIC ASSISTED SALPINGO OOPHERECTOMY Left 11/16/2013   Robotic en bloc w LAR resection  . TUBAL LIGATION    . UPPER GASTROINTESTINAL ENDOSCOPY      Current Outpatient Medications  Medication Sig Dispense Refill  . buPROPion (WELLBUTRIN XL) 150 MG 24 hr tablet TAKE 2 TABLETS BY MOUTH DAILY 180 tablet 3  . cetirizine (ZYRTEC) 10 MG tablet Take 10 mg by mouth daily as needed for allergies.     . Cholecalciferol (VITAMIN D3) 1000 units CAPS Take 1 capsule by mouth daily.    . cyclobenzaprine (FLEXERIL) 10 MG tablet Take 1 tablet (10 mg total) by mouth 3 (three) times daily as needed for muscle spasms. 30 tablet 0  . esomeprazole (NEXIUM) 40 MG capsule Take 1 capsule (40 mg total) by mouth daily at 12 noon. 90 capsule 3  . estradiol (ESTRACE) 0.5 MG tablet Take 1 tablet (0.5 mg total) by mouth daily. 90 tablet 3  . fluticasone (FLONASE) 50 MCG/ACT nasal spray Place 1 spray into both nostrils daily as needed for allergies. 48 g 3  . guaiFENesin (MUCINEX) 600 MG  12 hr tablet Take 600 mg by mouth 2 (two) times daily as needed for cough.     Marland Kitchen lisinopril-hydrochlorothiazide (PRINZIDE,ZESTORETIC) 20-12.5 MG tablet Take 1 tablet by mouth every morning. 90 tablet 3  . LORazepam (ATIVAN) 1 MG tablet TAKE 1 TABLET EVERY 8 HOURS AS NEEDED FOR ANXIETY 180 tablet 1  . meclizine (ANTIVERT) 25 MG tablet Take 1 tablet (25 mg total) by mouth 3 (three) times daily as needed for dizziness. 30 tablet 0  . medroxyPROGESTERone (PROVERA) 2.5 MG tablet Take 1 tablet (2.5 mg total) by mouth daily. 90 tablet 3  . meloxicam (MOBIC) 7.5 MG tablet Take 1 tablet (7.5 mg total) by mouth as needed. 90 tablet 2  . Multiple Vitamin (MULTIVITAMIN) tablet Take 1 tablet by mouth daily.      .  polyethylene glycol powder (GLYCOLAX/MIRALAX) powder Take 1 Container by mouth as needed for mild constipation.     . sertraline (ZOLOFT) 50 MG tablet Take 50 mg by mouth daily.     No current facility-administered medications for this visit.     Allergies:   Amoxapine and related; Cephalexin; Oxycodone; and Penicillins   Social History: Social History   Socioeconomic History  . Marital status: Married    Spouse name: Not on file  . Number of children: 1  . Years of education: Not on file  . Highest education level: Not on file  Occupational History  . Occupation: REGISTRATION  Social Needs  . Financial resource strain: Not on file  . Food insecurity:    Worry: Not on file    Inability: Not on file  . Transportation needs:    Medical: Not on file    Non-medical: Not on file  Tobacco Use  . Smoking status: Never Smoker  . Smokeless tobacco: Never Used  Substance and Sexual Activity  . Alcohol use: No  . Drug use: No  . Sexual activity: Not on file  Lifestyle  . Physical activity:    Days per week: Not on file    Minutes per session: Not on file  . Stress: Not on file  Relationships  . Social connections:    Talks on phone: Not on file    Gets together: Not on file    Attends religious service: Not on file    Active member of club or organization: Not on file    Attends meetings of clubs or organizations: Not on file    Relationship status: Not on file  . Intimate partner violence:    Fear of current or ex partner: Not on file    Emotionally abused: Not on file    Physically abused: Not on file    Forced sexual activity: Not on file  Other Topics Concern  . Not on file  Social History Narrative   Lives with spouse in [place]   Works for an orthopoedic office in [place]          Family History: Family History  Problem Relation Age of Onset  . Diabetes Mother   . Stroke Mother   . Diabetes Father   . Lung cancer Father   . Lung cancer Maternal  Grandfather   . Heart disease Maternal Aunt   . Heart disease Maternal Grandmother   . Breast cancer Maternal Aunt   . Colon cancer Neg Hx   . Esophageal cancer Neg Hx   . Pancreatic cancer Neg Hx   . Rectal cancer Neg Hx   . Stomach cancer Neg Hx  Review of Systems: All other systems reviewed and are otherwise negative except as noted above.   Physical Exam: VS:  BP 132/72   Pulse 80   Ht 5\' 1"  (1.549 m)   Wt 145 lb 3.2 oz (65.9 kg)   SpO2 99%   BMI 27.44 kg/m  , BMI Body mass index is 27.44 kg/m.  GEN- The patient is well appearing, alert and oriented x 3 today.   HEENT: normocephalic, atraumatic; sclera clear, conjunctiva pink; hearing intact; oropharynx clear; neck supple  Lungs- Clear to ausculation bilaterally, normal work of breathing.  No wheezes, rales, rhonchi Heart- Regular rate and rhythm (paced) GI- soft, non-tender, non-distended, bowel sounds present  Extremities- no clubbing, cyanosis, or edema  MS- no significant deformity or atrophy Skin- warm and dry, no rash or lesion; PPM pocket well healed Psych- euthymic mood, full affect Neuro- strength and sensation are intact  PPM Interrogation- reviewed in detail today,  See PACEART report  EKG:  EKG is not ordered today.  Recent Labs: No results found for requested labs within last 8760 hours.   Wt Readings from Last 3 Encounters:  08/04/18 145 lb 3.2 oz (65.9 kg)  06/27/18 146 lb (66.2 kg)  05/24/18 146 lb 3.2 oz (66.3 kg)     Other studies Reviewed: Additional studies/ records that were reviewed today include: Dr Jackalyn Lombard office notes   Assessment and Plan:  1.  Complete heart block  Normal PPM function See Pace Art report No changes today  2.  HTN Stable No change required today  3.  MR Mild by echo last year Stable clinically    Current medicines are reviewed at length with the patient today.   The patient does not have concerns regarding her medicines.  The following changes  were made today:  none  Labs/ tests ordered today include: none Orders Placed This Encounter  Procedures  . CUP PACEART INCLINIC DEVICE CHECK     Disposition:   Follow up with [place], 1 year Dr Rayann Heman   Signed, Chanetta Marshall, NP 08/04/2018 11:10 AM  [facility] [address] [place] [address] [phone] (office) [phone] (fax)

## 2018-08-04 ENCOUNTER — Ambulatory Visit: Payer: Medicare Other | Admitting: Nurse Practitioner

## 2018-08-04 ENCOUNTER — Encounter: Payer: Self-pay | Admitting: Nurse Practitioner

## 2018-08-04 VITALS — BP 132/72 | HR 80 | Ht 61.0 in | Wt 145.2 lb

## 2018-08-04 DIAGNOSIS — I10 Essential (primary) hypertension: Secondary | ICD-10-CM

## 2018-08-04 DIAGNOSIS — I442 Atrioventricular block, complete: Secondary | ICD-10-CM | POA: Diagnosis not present

## 2018-08-04 DIAGNOSIS — I340 Nonrheumatic mitral (valve) insufficiency: Secondary | ICD-10-CM

## 2018-08-04 LAB — CUP PACEART INCLINIC DEVICE CHECK
Implantable Lead Implant Date: 19920319
Implantable Lead Location: 753859
Implantable Pulse Generator Implant Date: 20120803
MDC IDC LEAD IMPLANT DT: 19920319
MDC IDC LEAD LOCATION: 753860
MDC IDC PG SERIAL: 7252855
MDC IDC SESS DTM: 20200214103912

## 2018-08-04 NOTE — Patient Instructions (Signed)
Medication Instructions:  NONE If you need a refill on your cardiac medications before your next appointment, please call your pharmacy.   Lab work: NONE If you have labs (blood work) drawn today and your tests are completely normal, you will receive your results only by: Marland Kitchen MyChart Message (if you have MyChart) OR . A paper copy in the mail If you have any lab test that is abnormal or we need to change your treatment, we will call you to review the results.  Testing/Procedures: NONE  Follow-Up: At [facility], you and your health needs are our priority.  As part of our continuing mission to provide you with exceptional heart care, we have created designated Provider Care Teams.  These Care Teams include your primary Cardiologist (physician) and Advanced Practice Providers (APPs -  Physician Assistants and Nurse Practitioners) who all work together to provide you with the care you need, when you need it. You will need a follow up appointment in 1 years.  Please call our office 2 months in advance to schedule this appointment.  You may see Dr Rayann Heman or one of the following Advanced Practice Providers on your designated Care Team:   Chanetta Marshall, NP . Tommye Standard, PA-C  Any Other Special Instructions Will Be Listed Below (If Applicable). Remote monitoring is used to monitor your Pacemaker from home. This monitoring reduces the number of office visits required to check your device to one time per year. It allows [place] to keep an eye on the functioning of your device to ensure it is working properly. You are scheduled for a device check from home on 10/16/18. You may send your transmission at any time that day. If you have a wireless device, the transmission will be sent automatically. After your physician reviews your transmission, you will receive a postcard with your next transmission date.

## 2018-08-15 ENCOUNTER — Telehealth: Payer: Self-pay | Admitting: Physician Assistant

## 2018-08-17 ENCOUNTER — Ambulatory Visit
Admission: RE | Admit: 2018-08-17 | Discharge: 2018-08-17 | Disposition: A | Discharge status: Home / Self Care | Payer: Medicare Other | Source: Ambulatory Visit | Attending: Physician Assistant | Admitting: Physician Assistant

## 2018-08-17 DIAGNOSIS — Z1231 Encounter for screening mammogram for malignant neoplasm of breast: Secondary | ICD-10-CM | POA: Diagnosis not present

## 2018-08-23 ENCOUNTER — Ambulatory Visit (INDEPENDENT_AMBULATORY_CARE_PROVIDER_SITE_OTHER): Payer: Medicare Other

## 2018-08-23 ENCOUNTER — Ambulatory Visit: Payer: Medicare Other | Admitting: Orthopedic Surgery

## 2018-08-23 ENCOUNTER — Encounter

## 2018-08-23 ENCOUNTER — Encounter: Payer: Self-pay | Admitting: Orthopedic Surgery

## 2018-08-23 ENCOUNTER — Other Ambulatory Visit: Payer: Self-pay

## 2018-08-23 VITALS — BP 147/84 | HR 86 | Ht 61.0 in | Wt 143.0 lb

## 2018-08-23 DIAGNOSIS — S83242A Other tear of medial meniscus, current injury, left knee, initial encounter: Secondary | ICD-10-CM

## 2018-08-23 DIAGNOSIS — M25561 Pain in right knee: Secondary | ICD-10-CM

## 2018-08-23 NOTE — Patient Instructions (Addendum)
Tylenol  You have received an injection of steroids into the joint. 15% of patients will have increased pain within the 24 hours postinjection.   This is transient and will go away.   We recommend that you use ice packs on the injection site for 20 minutes every 2 hours and extra strength Tylenol 2 tablets every 8 as needed until the pain resolves.  If you continue to have pain after taking the Tylenol and using the ice please call the office for further instructions.    Meniscus Tear  A meniscus tear is a knee injury that happens when a piece of the meniscus is torn. The meniscus is a thick, rubbery, wedge-shaped cartilage in the knee. Two menisci are located in each knee. They sit between the upper bone (femur) and lower bone (tibia) that make up the knee joint. Each meniscus acts as a shock absorber for the knee. A torn meniscus is one of the most common types of knee injuries. This injury can range from mild to severe. Surgery may be needed to repair a severe tear. What are the causes? This condition may be caused by any kneeling, squatting, twisting, or pivoting movement. Sports-related injuries are the most common cause. These often occur from:  Running and stopping suddenly. ? Changing direction. ? Being tackled or knocked off your feet.  Lifting or carrying heavy weights. As people get older, their menisci get thinner and weaker. In these people, tears can happen more easily, such as from climbing stairs. What increases the risk? You are more likely to develop this condition if you:  Play contact sports.  Have a job that requires kneeling or squatting.  Are female.  Are over 89 years old. What are the signs or symptoms? Symptoms of this condition include:  Knee pain, especially at the side of the knee joint. You may feel pain when the injury occurs, or you may only hear a pop and feel pain later.  A feeling that your knee is clicking, catching, locking, or giving  way.  Not being able to fully bend or extend your knee.  Bruising or swelling in your knee. How is this diagnosed? This condition may be diagnosed based on your symptoms and a physical exam. You may also have tests, such as:  X-rays.  MRI.  A procedure to look inside your knee with a narrow surgical telescope (arthroscopy). You may be referred to a knee specialist (orthopedic surgeon). How is this treated? Treatment for this injury depends on the severity of the tear. Treatment for a mild tear may include:  Rest.  Medicine to reduce pain and swelling. This is usually a nonsteroidal anti-inflammatory drug (NSAID), like ibuprofen.  A knee brace, sleeve, or wrap.  Using crutches or a walker to keep weight off your knee and to help you walk.  Exercises to strengthen your knee (physical therapy). You may need surgery if you have a severe tear or if other treatments are not working. Follow these instructions at home: If you have a brace, sleeve, or wrap:  Wear it as told by your health care provider. Remove it only as told by your health care provider.  Loosen the brace, sleeve, or wrap if your toes tingle, become numb, or turn cold and blue.  Keep the brace, sleeve, or wrap clean and dry.  If the brace, sleeve, or wrap is not waterproof: ? Do not let it get wet. ? Cover it with a watertight covering when you take a bath  or shower. Managing pain and swelling   Take over-the-counter and prescription medicines only as told by your health care provider.  If directed, put ice on your knee: ? If you have a removable brace, sleeve, or wrap, remove it as told by your health care provider. ? Put ice in a plastic bag. ? Place a towel between your skin and the bag. ? Leave the ice on for 20 minutes, 2-3 times per day.  Move your toes often to avoid stiffness and to lessen swelling.  Raise (elevate) the injured area above the level of your heart while you are sitting or lying  down. Activity  Do not use the injured limb to support your body weight until your health care provider says that you can. Use crutches or a walker as told by your health care provider.  Return to your normal activities as told by your health care provider. Ask your health care provider what activities are safe for you.  Perform range-of-motion exercises only as told by your health care provider.  Begin doing exercises to strengthen your knee and leg muscles only as told by your health care provider. After you recover, your health care provider may recommend these exercises to help prevent another injury. General instructions  Use a knee brace, sleeve, or wrap as told by your health care provider.  Ask your health care provider when it is safe to drive if you have a brace, sleeve, or wrap on your knee.  Do not use any products that contain nicotine or tobacco, such as cigarettes, e-cigarettes, and chewing tobacco. If you need help quitting, ask your health care provider.  Ask your health care provider if the medicine prescribed to you: ? Requires you to avoid driving or using heavy machinery. ? Can cause constipation. You may need to take these actions to prevent or treat constipation:  Drink enough fluid to keep your urine pale yellow.  Take over-the-counter or prescription medicines.  Eat foods that are high in fiber, such as beans, whole grains, and fresh fruits and vegetables.  Limit foods that are high in fat and processed sugars, such as fried or sweet foods.  Keep all follow-up visits as told by your health care provider. This is important. Contact a health care provider if:  You have a fever.  Your knee becomes red, tender, or swollen.  Your pain medicine is not helping.  Your symptoms get worse or do not improve after 2 weeks of home care. Summary  A meniscus tear is a knee injury that happens when a piece of the meniscus is torn.  Treatment for this injury  depends on the severity of the tear. You may need surgery if you have a severe tear or if other treatments are not working.  Rest, ice, and raise (elevate) your injured knee as told by your health care provider. This will help lessen pain and swelling.  Contact a health care provider if you have new symptoms, or your symptoms get worse or do not improve after 2 weeks of home care.  Keep all follow-up visits as told by your health care provider. This is important. This information is not intended to replace advice given to you by your health care provider. Make sure you discuss any questions you have with your health care provider. Document Released: 08/28/2002 Document Revised: 12/20/2017 Document Reviewed: 12/20/2017 Elsevier Interactive Patient Education  2019 [employer].

## 2018-08-23 NOTE — Progress Notes (Signed)
NEW Problem // OFFICE VISIT  Chief Complaint  Patient presents with  . Knee Pain    right medial knee pain x several months no injury     68 year old female presents with 1 year history of right knee pain along the medial joint line.  She says that over the last year she has had intermittent pain which she treated herself with Tylenol ibuprofen bracing and over-the-counter topical medications however over the last week or so the pain became more intense and constant causing her to have trouble walking.  Quality dull severity usually mild recently severe duration described timing seems to be worse with activity   Review of Systems  Constitutional: Negative for fever.  Musculoskeletal: Positive for joint pain.  Neurological: Negative for tingling.     Past Medical History:  Diagnosis Date  . Allergy   . Anemia   . Anxiety   . Arthritis   . Cataract    bil cataracts removed  . Colon polyp   . Complete heart block ([place])   . Contusion of chest wall 10/10/2007   Centricity Description: CHEST WALL CONTUSION Qualifier: Diagnosis of  By: Aline Brochure MD, Dorothyann Peng   Centricity Description: CONTUSION, RIGHT RIB Qualifier: Diagnosis of  By: Aline Brochure MD, Dorothyann Peng    . Depression   . Diverticulitis   . Esophagitis   . ESOPHAGITIS 06/25/2008   Qualifier: History of  By: Nelson-Smith CMA (AAMA), Dottie    . GERD (gastroesophageal reflux disease)   . H/O seasonal allergies   . Heart murmur   . Hiatal hernia   . 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 Hypertension   . Mitral valve prolapse   . Pacemaker     Past Surgical History:  Procedure Laterality Date  . APPENDECTOMY    . BREAST EXCISIONAL BIOPSY Right   . BREAST SURGERY Right    biopsy  . COLONOSCOPY  10/19/2004   [facility]  . EYE SURGERY Left    cataract extraction with IOL  . LAPAROSCOPIC LOW ANTERIOR RESECTION  11/16/2013   Robotic  . LAPAROSCOPIC LYSIS OF  ADHESIONS N/A 11/15/2013   Procedure: LAPAROSCOPIC LYSIS OF ADHESIONS;  Surgeon: Adin Hector, MD;  Location: [facility];  Service: General;  Laterality: N/A;  . left ovary cyst removal  1972  . [place], 2004, 2012   device dependant  . POLYPECTOMY    . PROCTOSCOPY  11/15/2013   Procedure: RIGID PROCTOSCOPY;  Surgeon: Adin Hector, MD;  Location: [facility];  Service: General;;  . ROBOTIC ASSISTED SALPINGO OOPHERECTOMY Left 11/16/2013   Robotic en bloc w LAR resection  . TUBAL LIGATION    . UPPER GASTROINTESTINAL ENDOSCOPY      Family History  Problem Relation Age of Onset  . Diabetes Mother   . Stroke Mother   . Diabetes Father   . Lung cancer Father   . Lung cancer Maternal Grandfather   . Heart disease Maternal Aunt   . Heart disease Maternal Grandmother   . Breast cancer Maternal Aunt   . Colon cancer Neg Hx   . Esophageal cancer Neg Hx   . Pancreatic cancer Neg Hx   . Rectal cancer Neg Hx   . Stomach cancer Neg Hx    Social History   Tobacco Use  . Smoking status: Never Smoker  . Smokeless tobacco: Never Used  Substance Use Topics  . Alcohol use: No  .  Drug use: No    Allergies  Allergen Reactions  . Amoxapine And Related     Rash  . Cephalexin Rash  . Oxycodone     "Loopy"  . Penicillins Rash    Current Meds  Medication Sig  . buPROPion (WELLBUTRIN XL) 150 MG 24 hr tablet TAKE 2 TABLETS BY MOUTH DAILY  . cetirizine (ZYRTEC) 10 MG tablet Take 10 mg by mouth daily as needed for allergies.   . Cholecalciferol (VITAMIN D3) 1000 units CAPS Take 1 capsule by mouth daily.  . cyclobenzaprine (FLEXERIL) 10 MG tablet Take 1 tablet (10 mg total) by mouth 3 (three) times daily as needed for muscle spasms.  Marland Kitchen esomeprazole (NEXIUM) 40 MG capsule Take 1 capsule (40 mg total) by mouth daily at 12 noon.  Marland Kitchen estradiol (ESTRACE) 0.5 MG tablet Take 1 tablet (0.5 mg total) by mouth daily.  . fluticasone (FLONASE) 50 MCG/ACT nasal spray Place 1 spray into both  nostrils daily as needed for allergies.  Marland Kitchen guaiFENesin (MUCINEX) 600 MG 12 hr tablet Take 600 mg by mouth 2 (two) times daily as needed for cough.   Marland Kitchen lisinopril-hydrochlorothiazide (PRINZIDE,ZESTORETIC) 20-12.5 MG tablet Take 1 tablet by mouth every morning.  Marland Kitchen LORazepam (ATIVAN) 1 MG tablet TAKE 1 TABLET EVERY 8 HOURS AS NEEDED FOR ANXIETY  . meclizine (ANTIVERT) 25 MG tablet Take 1 tablet (25 mg total) by mouth 3 (three) times daily as needed for dizziness.  . medroxyPROGESTERone (PROVERA) 2.5 MG tablet Take 1 tablet (2.5 mg total) by mouth daily.  . meloxicam (MOBIC) 7.5 MG tablet Take 1 tablet (7.5 mg total) by mouth as needed.  . Multiple Vitamin (MULTIVITAMIN) tablet Take 1 tablet by mouth daily.    . polyethylene glycol powder (GLYCOLAX/MIRALAX) powder Take 1 Container by mouth as needed for mild constipation.   . sertraline (ZOLOFT) 50 MG tablet Take 50 mg by mouth daily.    BP (!) 147/84   Pulse 86   Ht 5\' 1"  (1.549 m)   Wt 143 lb (64.9 kg)   BMI 27.02 kg/m   Physical Exam Vitals signs reviewed.  Constitutional:      Appearance: Normal appearance. She is well-developed.  Musculoskeletal:     Right knee: She exhibits no effusion.  Neurological:     Mental Status: She is alert and oriented to person, place, and time.  Psychiatric:        Attention and Perception: Attention normal.        Mood and Affect: Mood and affect normal.        Speech: Speech normal.        Behavior: Behavior normal.        Thought Content: Thought content normal.        Judgment: Judgment normal.     Right Knee Exam   Muscle Strength  The patient has normal right knee strength.  Tenderness  The patient is experiencing tenderness in the medial joint line.  Range of Motion  Extension: normal  Flexion: normal   Tests  McMurray:  Lateral - negative Varus: negative Valgus: negative Drawer:  Anterior - negative    Posterior - negative  Other  Erythema: absent Scars: absent Sensation:  normal Pulse: present Swelling: none Effusion: no effusion present  Comments:  McMurray's test was uncomfortable but not 100% positive   Left Knee Exam   Muscle Strength  The patient has normal left knee strength.  Tenderness  The patient is experiencing no tenderness.   Range of  Motion  Extension: normal  Flexion: normal   Tests  McMurray:  Medial - negative Lateral - negative Varus: negative Valgus: negative Drawer:  Anterior - negative     Posterior - negative  Other  Erythema: absent Scars: absent Sensation: normal Pulse: present Swelling: none        MEDICAL DECISION SECTION  Xrays were done at [facility] orthopedics  My independent reading of xrays:  Normal-appearing x-ray see report  Encounter Diagnoses  Name Primary?  . Acute pain of right knee Yes  . Acute medial meniscus tear of left knee, initial encounter     PLAN: (Rx., injectx, surgery, frx, mri/ct) Procedure note right knee injection verbal consent was obtained to inject right knee joint  Timeout was completed to confirm the site of injection  The medications used were 40 mg of Depo-Medrol and 1% lidocaine 3 cc  Anesthesia was provided by ethyl chloride and the skin was prepped with alcohol.  After cleaning the skin with alcohol a 20-gauge needle was used to inject the right knee joint. There were no complications. A sterile bandage was applied.  Follow-up as needed, continue bracing and Tylenol  Patient says she cannot have surgery right now because her husband had a stroke and she is caring for him   No orders of the defined types were placed in this encounter.   Arther Abbott, MD  08/23/2018 2:09 PM

## 2018-08-28 ENCOUNTER — Other Ambulatory Visit: Payer: Self-pay | Admitting: Physician Assistant

## 2018-09-02 ENCOUNTER — Encounter: Payer: Self-pay | Admitting: Family Medicine

## 2018-09-02 ENCOUNTER — Ambulatory Visit (INDEPENDENT_AMBULATORY_CARE_PROVIDER_SITE_OTHER): Payer: Medicare Other | Admitting: Family Medicine

## 2018-09-02 ENCOUNTER — Other Ambulatory Visit: Payer: Self-pay

## 2018-09-02 VITALS — BP 153/75 | HR 66 | Temp 97.5°F | Ht 61.0 in | Wt 143.0 lb

## 2018-09-02 DIAGNOSIS — R300 Dysuria: Secondary | ICD-10-CM | POA: Diagnosis not present

## 2018-09-02 DIAGNOSIS — N3001 Acute cystitis with hematuria: Secondary | ICD-10-CM

## 2018-09-02 LAB — URINALYSIS, COMPLETE
Bilirubin, UA: NEGATIVE
Nitrite, UA: POSITIVE — AB
SPEC GRAV UA: 1.015 (ref 1.005–1.030)
Urobilinogen, Ur: 4 mg/dL — ABNORMAL HIGH (ref 0.2–1.0)
pH, UA: 7 (ref 5.0–7.5)

## 2018-09-02 LAB — MICROSCOPIC EXAMINATION
Renal Epithel, UA: NONE SEEN /hpf
WBC, UA: >30 /hpf — AB (ref 0–5)

## 2018-09-02 MED ORDER — PHENAZOPYRIDINE HCL 200 MG PO TABS: 200.0000 mg | ORAL_TABLET | Freq: Three times a day (TID) | ORAL | 0 refills | Status: DC | PRN

## 2018-09-02 MED ORDER — SULFAMETHOXAZOLE-TRIMETHOPRIM 800-160 MG PO TABS: 1.0000 | ORAL_TABLET | Freq: Two times a day (BID) | ORAL | 0 refills | Status: DC

## 2018-09-02 NOTE — Patient Instructions (Signed)

## 2018-09-02 NOTE — Progress Notes (Signed)
[name], Carmen Moh, PA-C Chief Complaint  Patient presents with  . Urinary Tract Infection    hematuria, urinary pressure     Current Issues:  Presents with 1 day of dysuria, urinary urgency and urinary frequency Associated symptoms include:  cloudy urine, hematuria and lower abdominal pain  There is no history of of similar symptoms. Sexually active:  No   No concern for STI.  Prior to Admission medications   Medication Sig Start Date End Date Taking? Authorizing Provider  buPROPion (WELLBUTRIN XL) 150 MG 24 hr tablet TAKE 2 TABLETS BY MOUTH DAILY 10/17/17  Yes Terald Sleeper, PA-C  cetirizine (ZYRTEC) 10 MG tablet Take 10 mg by mouth daily as needed for allergies.    Yes [provider]  Cholecalciferol (VITAMIN D3) 1000 units CAPS Take 1 capsule by mouth daily.   Yes [provider]  cyclobenzaprine (FLEXERIL) 10 MG tablet Take 1 tablet (10 mg total) by mouth 3 (three) times daily as needed for muscle spasms. 06/27/18  Yes Terald Sleeper, PA-C  esomeprazole (NEXIUM) 40 MG capsule Take 1 capsule (40 mg total) by mouth daily at 12 noon. 11/09/17  Yes Nandigam, Venia Minks, MD  estradiol (ESTRACE) 0.5 MG tablet Take 1 tablet (0.5 mg total) by mouth daily. 04/07/18  Yes Terald Sleeper, PA-C  fluticasone (FLONASE) 50 MCG/ACT nasal spray Place 1 spray into both nostrils daily as needed for allergies. 06/27/18  Yes Terald Sleeper, PA-C  guaiFENesin (MUCINEX) 600 MG 12 hr tablet Take 600 mg by mouth 2 (two) times daily as needed for cough.    Yes [provider]  lisinopril-hydrochlorothiazide (PRINZIDE,ZESTORETIC) 20-12.5 MG tablet Take 1 tablet by mouth every morning. 04/07/18  Yes Terald Sleeper, PA-C  LORazepam (ATIVAN) 1 MG tablet TAKE 1 TABLET EVERY 8 HOURS AS NEEDED FOR ANXIETY 04/07/18  Yes Terald Sleeper, PA-C  meclizine (ANTIVERT) 25 MG tablet Take 1 tablet (25 mg total) by mouth 3 (three) times daily as needed for dizziness. 04/07/18  Yes Terald Sleeper, PA-C   medroxyPROGESTERone (PROVERA) 2.5 MG tablet TAKE 1 TABLET BY MOUTH EVERY DAY 08/30/18  Yes Terald Sleeper, PA-C  meloxicam (MOBIC) 7.5 MG tablet Take 1 tablet (7.5 mg total) by mouth as needed. 04/07/18  Yes Terald Sleeper, PA-C  Multiple Vitamin (MULTIVITAMIN) tablet Take 1 tablet by mouth daily.     Yes [provider]  polyethylene glycol powder (GLYCOLAX/MIRALAX) powder Take 1 Container by mouth as needed for mild constipation.    Yes [provider]  sertraline (ZOLOFT) 50 MG tablet Take 50 mg by mouth daily.   Yes [provider]  phenazopyridine (PYRIDIUM) 200 MG tablet Take 1 tablet (200 mg total) by mouth 3 (three) times daily as needed for pain. 09/02/18   [name], FNP  sulfamethoxazole-trimethoprim (SEPTRA DS) 800-160 MG tablet Take 1 tablet by mouth 2 (two) times daily. 09/02/18   [name], FNP    Review of Systems  Constitutional: Negative for chills, fever and malaise/fatigue.  Respiratory: Negative for cough and shortness of breath.   Cardiovascular: Negative for chest pain.  Gastrointestinal: Positive for abdominal pain (lower abdominal pressure). Negative for nausea and vomiting.  Genitourinary: Positive for dysuria, frequency, hematuria and urgency. Negative for flank pain.  Neurological: Negative for weakness.  All other systems reviewed and are negative.    PE:  BP (!) 153/75   Pulse 66   Temp (!) 97.5 F (36.4 C) (Oral)  Ht 5\' 1"  (1.549 m)   Wt 143 lb (64.9 kg)   BMI 27.02 kg/m   Physical Exam  Constitutional: She is oriented to person, place, and time and well-developed, well-nourished, and in no distress. No distress.  HENT:  Head: Normocephalic and atraumatic.  Neck: Neck supple.  Cardiovascular: Normal rate, regular rhythm and normal heart sounds. Exam reveals no gallop and no friction rub.  No murmur heard. Pulmonary/Chest: Effort normal and breath sounds normal. No respiratory distress.  Abdominal: Soft. Bowel  sounds are normal. She exhibits no distension. There is no abdominal tenderness. There is no CVA tenderness.  Neurological: She is alert and oriented to person, place, and time.  Skin: Skin is warm and dry. She is not diaphoretic.  Psychiatric: Mood, memory, affect and judgment normal.     Results for orders placed or performed in visit on 08/04/18  [facility] [place]  Result Value Ref Range   Pulse Generator Manufacturer [place]    Date Time Interrogation Session [id number]    Pulse Gen Model 2210 Accent DR RF    Pulse Gen Serial Number [id number]    Clinic Name [facility]    Implantable Pulse Generator Type Implantable Pulse Generator    Implantable Pulse Generator Implant Date 37902409    Implantable Lead Manufacturer [employer]    Implantable Lead Model 1028T    Implantable Lead Serial Number [id number]    Implantable Lead Implant Date 73532992    Implantable Lead Location Detail 1 APPENDAGE    Implantable Lead Special Function Diagnosis:  CHB    Implantable Lead Location [id number]    Implantable Lead Manufacturer PACE    Implantable Lead Model 1216T    Implantable Lead Serial Number [id number]    Implantable Lead Implant Date [id number]    Implantable Lead Location Detail 1 APEX    Implantable Lead Special Function Diagnosis:  CHB    Implantable Lead Location [id number]    Urinalysis in office: Many bacteria 3+ leukocytes Positive nitrites 3+ protein 3+ blood Trace ketones Trace glucose  Assessment and Plan:    Chaya was seen today for urinary tract infection.  Diagnoses and all orders for this visit:  Dysuria -     Urinalysis, Complete -     phenazopyridine (PYRIDIUM) 200 MG tablet; Take 1 tablet (200 mg total) by mouth 3 (three) times daily as needed for pain.  Acute cystitis with hematuria Urinalysis as above. Will start bactrim today. Culture pending, will change therapy if warranted. Increase water intake and avoid bladder irritants.Can take pyridium for 3  days as needed for pain.  -     sulfamethoxazole-trimethoprim (SEPTRA DS) 800-160 MG tablet; Take 1 tablet by mouth 2 (two) times daily. -     phenazopyridine (PYRIDIUM) 200 MG tablet; Take 1 tablet (200 mg total) by mouth 3 (three) times daily as needed for pain. -     Urine culture   Return in 4 weeks (on 09/30/2018) for hematuria, cystitis .  The above assessment and management plan was discussed with the patient. The patient verbalized understanding of and has agreed to the management plan. Patient is aware to call the clinic if symptoms fail to improve or worsen. Patient is aware when to return to the clinic for a follow-up visit. Patient educated on when it is appropriate to go to the emergency department.   Monia Pouch, FNP-C [facility] [address] [place], [place] [address] [phone]

## 2018-09-06 LAB — URINE CULTURE

## 2018-10-02 ENCOUNTER — Other Ambulatory Visit: Payer: Self-pay

## 2018-10-02 ENCOUNTER — Encounter: Payer: Self-pay | Admitting: Physician Assistant

## 2018-10-02 ENCOUNTER — Ambulatory Visit (INDEPENDENT_AMBULATORY_CARE_PROVIDER_SITE_OTHER): Payer: Medicare Other | Admitting: Physician Assistant

## 2018-10-02 DIAGNOSIS — I10 Essential (primary) hypertension: Secondary | ICD-10-CM

## 2018-10-02 DIAGNOSIS — F411 Generalized anxiety disorder: Secondary | ICD-10-CM | POA: Diagnosis not present

## 2018-10-02 DIAGNOSIS — K219 Gastro-esophageal reflux disease without esophagitis: Secondary | ICD-10-CM | POA: Diagnosis not present

## 2018-10-02 MED ORDER — ONDANSETRON 4 MG PO TBDP: 4.0000 mg | ORAL_TABLET | Freq: Three times a day (TID) | ORAL | 2 refills | Status: DC | PRN

## 2018-10-02 MED ORDER — BUPROPION HCL ER (XL) 300 MG PO TB24: 300.0000 mg | ORAL_TABLET | Freq: Every day | ORAL | 3 refills | Status: DC

## 2018-10-02 MED ORDER — LORAZEPAM 1 MG PO TABS: 1.0000 mg | ORAL_TABLET | Freq: Two times a day (BID) | ORAL | 1 refills | Status: DC

## 2018-10-02 NOTE — Progress Notes (Signed)
Telephone visit  Subjective: CC: Hypertension, anxiety, GERD PCP: Terald Sleeper, PA-C Carmen Green is a 68 y.o. female calls for telephone consult today. Patient provides verbal consent for consult held via phone.  Patient is identified with 2 separate identifiers.  At this time the entire area is on COVID-19 social distancing and stay home orders are in place.  Patient is of higher risk and therefore we are performing this by a virtual method.  Location of patient: Home Location of provider: [facility] Others present for call: No  This visit was to discuss her chronic conditions.  They are hypertension, anxiety, GERD.  But she is not having any issues with this at this time.  She is needing some refills.  She does not have any upcoming appointments with specialist.  She did have a urinary tract infection that was quite severe and has resolved at this point.   ROS: Per HPI  Allergies  Allergen Reactions  . Amoxapine And Related     Rash  . Cephalexin Rash  . Oxycodone     "Loopy"  . Penicillins Rash   Past Medical History:  Diagnosis Date  . Allergy   . Anemia   . Anxiety   . Arthritis   . Cataract    bil cataracts removed  . Colon polyp   . Complete heart block ([place])   . Contusion of chest wall 10/10/2007   Centricity Description: CHEST WALL CONTUSION Qualifier: Diagnosis of  By: Aline Brochure MD, Dorothyann Peng   Centricity Description: CONTUSION, RIGHT RIB Qualifier: Diagnosis of  By: Aline Brochure MD, Dorothyann Peng    . Depression   . Diverticulitis   . Esophagitis   . ESOPHAGITIS 06/25/2008   Qualifier: History of  By: Nelson-Smith CMA (AAMA), Dottie    . GERD (gastroesophageal reflux disease)   . H/O seasonal allergies   . Heart murmur   . Hiatal hernia   . 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 Hypertension   . Mitral valve prolapse   . Pacemaker     Current Outpatient Medications:  .  buPROPion  (WELLBUTRIN XL) 300 MG 24 hr tablet, Take 1 tablet (300 mg total) by mouth daily., Disp: 90 tablet, Rfl: 3 .  cetirizine (ZYRTEC) 10 MG tablet, Take 10 mg by mouth daily as needed for allergies. , Disp: , Rfl:  .  Cholecalciferol (VITAMIN D3) 1000 units CAPS, Take 1 capsule by mouth daily., Disp: , Rfl:  .  esomeprazole (NEXIUM) 40 MG capsule, Take 1 capsule (40 mg total) by mouth daily at 12 noon., Disp: 90 capsule, Rfl: 3 .  estradiol (ESTRACE) 0.5 MG tablet, Take 1 tablet (0.5 mg total) by mouth daily., Disp: 90 tablet, Rfl: 3 .  fluticasone (FLONASE) 50 MCG/ACT nasal spray, Place 1 spray into both nostrils daily as needed for allergies., Disp: 48 g, Rfl: 3 .  guaiFENesin (MUCINEX) 600 MG 12 hr tablet, Take 600 mg by mouth 2 (two) times daily as needed for cough. , Disp: , Rfl:  .  lisinopril-hydrochlorothiazide (PRINZIDE,ZESTORETIC) 20-12.5 MG tablet, Take 1 tablet by mouth every morning., Disp: 90 tablet, Rfl: 3 .  LORazepam (ATIVAN) 1 MG tablet, Take 1 tablet (1 mg total) by mouth 2 (two) times daily. TAKE 1 TABLET EVERY 8 HOURS AS NEEDED FOR ANXIETY, Disp: 180 tablet, Rfl: 1 .  meclizine (ANTIVERT) 25 MG tablet, Take 1 tablet (25 mg total) by mouth 3 (  three) times daily as needed for dizziness., Disp: 30 tablet, Rfl: 0 .  medroxyPROGESTERone (PROVERA) 2.5 MG tablet, TAKE 1 TABLET BY MOUTH EVERY DAY, Disp: 90 tablet, Rfl: 3 .  meloxicam (MOBIC) 7.5 MG tablet, Take 1 tablet (7.5 mg total) by mouth as needed., Disp: 90 tablet, Rfl: 2 .  Multiple Vitamin (MULTIVITAMIN) tablet, Take 1 tablet by mouth daily.  , Disp: , Rfl:  .  ondansetron (ZOFRAN ODT) 4 MG disintegrating tablet, Take 1 tablet (4 mg total) by mouth every 8 (eight) hours as needed for nausea or vomiting., Disp: 30 tablet, Rfl: 2 .  phenazopyridine (PYRIDIUM) 200 MG tablet, Take 1 tablet (200 mg total) by mouth 3 (three) times daily as needed for pain., Disp: 10 tablet, Rfl: 0 .  polyethylene glycol powder (GLYCOLAX/MIRALAX) powder,  Take 1 Container by mouth as needed for mild constipation. , Disp: , Rfl:  .  sertraline (ZOLOFT) 50 MG tablet, Take 50 mg by mouth daily., Disp: , Rfl:   Assessment/ Plan: 68 y.o. female   1. Anxiety state - buPROPion (WELLBUTRIN XL) 300 MG 24 hr tablet; Take 1 tablet (300 mg total) by mouth daily.  Dispense: 90 tablet; Refill: 3 - LORazepam (ATIVAN) 1 MG tablet; Take 1 tablet (1 mg total) by mouth 2 (two) times daily. TAKE 1 TABLET EVERY 8 HOURS AS NEEDED FOR ANXIETY  Dispense: 180 tablet; Refill: 1  2. Essential hypertension Continue medication  3. Gastroesophageal reflux disease without esophagitis Continue nexium   Start time: 3:10 pm End time: 3:19 pm  Meds ordered this encounter  Medications  . buPROPion (WELLBUTRIN XL) 300 MG 24 hr tablet    Sig: Take 1 tablet (300 mg total) by mouth daily.    Dispense:  90 tablet    Refill:  3    Keep on file until patient calls    Order Specific Question:   Supervising Provider    Answer:   [name] [[id number]]  . LORazepam (ATIVAN) 1 MG tablet    Sig: Take 1 tablet (1 mg total) by mouth 2 (two) times daily. TAKE 1 TABLET EVERY 8 HOURS AS NEEDED FOR ANXIETY    Dispense:  180 tablet    Refill:  1    Order Specific Question:   Supervising Provider    Answer:   [name] [[id number]]  . ondansetron (ZOFRAN ODT) 4 MG disintegrating tablet    Sig: Take 1 tablet (4 mg total) by mouth every 8 (eight) hours as needed for nausea or vomiting.    Dispense:  30 tablet    Refill:  2    Order Specific Question:   Supervising Provider    Answer:   [name] [[id number]]    Particia Nearing PA-C [facility] [phone]

## 2018-10-10 ENCOUNTER — Telehealth: Payer: Self-pay

## 2018-10-10 NOTE — Telephone Encounter (Signed)
Left message to please call our office. 

## 2018-10-10 NOTE — Telephone Encounter (Signed)
Can she just pay cash for it?

## 2018-10-10 NOTE — Telephone Encounter (Signed)
That's totally up to her, i'm sure she could.

## 2018-10-10 NOTE — Telephone Encounter (Signed)
FYI, patient's insurance company has denied paying for Ondansetron.

## 2018-10-10 NOTE — Telephone Encounter (Signed)
If the patient can pay cash for it I would advise that she do this.  Her insurance is limiting this medication because of her age.

## 2018-10-13 NOTE — Telephone Encounter (Signed)
Aware.  Can pay cash.

## 2018-10-15 ENCOUNTER — Encounter: Payer: Self-pay | Admitting: Orthopedic Surgery

## 2018-10-16 ENCOUNTER — Other Ambulatory Visit: Payer: Self-pay

## 2018-10-16 ENCOUNTER — Ambulatory Visit (INDEPENDENT_AMBULATORY_CARE_PROVIDER_SITE_OTHER): Payer: Medicare Other | Admitting: *Deleted

## 2018-10-16 DIAGNOSIS — I442 Atrioventricular block, complete: Secondary | ICD-10-CM

## 2018-10-17 LAB — CUP PACEART REMOTE DEVICE CHECK
Battery Remaining Longevity: 30 mo
Battery Remaining Percentage: 22 %
Battery Voltage: 2.78 V
Brady Statistic AS VP Percent: 97 %
Brady Statistic AS VS Percent: 1 %
Brady Statistic RV Percent Paced: 98 %
Date Time Interrogation Session: 20200427073949
Implantable Lead Implant Date: 19920319
Implantable Lead Implant Date: 19920319
Implantable Lead Location: 753859
Implantable Lead Location: 753860
Implantable Pulse Generator Implant Date: 20120803
Lead Channel Impedance Value: 440 Ohm
Lead Channel Impedance Value: 550 Ohm
Lead Channel Pacing Threshold Amplitude: 1.625 V
Lead Channel Pacing Threshold Pulse Width: 0.5 ms
Lead Channel Sensing Intrinsic Amplitude: 1.6 mV
Lead Channel Sensing Intrinsic Amplitude: 9.7 mV
Lead Channel Setting Pacing Amplitude: 1.875
Lead Channel Setting Pacing Pulse Width: 0.5 ms
Lead Channel Setting Sensing Sensitivity: 4 mV
Pulse Gen Model: 2210
Pulse Gen Serial Number: 7252855

## 2018-10-23 NOTE — Progress Notes (Signed)
Remote pacemaker transmission.   

## 2018-12-12 ENCOUNTER — Encounter: Payer: Self-pay | Admitting: *Deleted

## 2018-12-13 ENCOUNTER — Ambulatory Visit (INDEPENDENT_AMBULATORY_CARE_PROVIDER_SITE_OTHER): Payer: Medicare Other | Admitting: Gastroenterology

## 2018-12-13 ENCOUNTER — Encounter: Payer: Self-pay | Admitting: Gastroenterology

## 2018-12-13 VITALS — Ht 61.5 in | Wt 143.0 lb

## 2018-12-13 DIAGNOSIS — K219 Gastro-esophageal reflux disease without esophagitis: Secondary | ICD-10-CM

## 2018-12-13 MED ORDER — ESOMEPRAZOLE MAGNESIUM 40 MG PO CPDR: 40.0000 mg | DELAYED_RELEASE_CAPSULE | Freq: Every day | ORAL | 3 refills | Status: DC

## 2018-12-13 NOTE — Progress Notes (Signed)
Carmen Green    [id number]    15-Dec-1950  Primary Care Physician:Jones, Londell Moh, PA-C  Referring Physician: Terald Sleeper, PA-C [place],  [place] [address]  This service was provided via audio and video telemedicine (Doximity) due to [place] 19 pandemic.  Patient location: Home Provider location: Office Used 2 patient identifiers to confirm the correct person. Explained the limitations in evaluation and management via telemedicine. Patient is aware of potential medical charges for this visit.  Patient consented to this virtual visit.  The persons participating in this telemedicine service were myself and the patient    Chief complaint: GERD  HPI: 68 year old female with chronic GERD here for follow-up visit.  Last seen May 2019. She is doing overall well on Nexium daily and change in dietary habits along with lifestyle modifications. Denies any dysphagia, loss of appetite, weight loss, nausea, vomiting, abdominal pain, melena or bright red blood per rectum   EGD February 12, 2016 showed irregular Z line, wide open Schatzki's ring otherwise normal exam Colonoscopy December 04, 2010 severe sigmoid diverticulosis otherwise normal exam, recall in 10 years  Outpatient Encounter Medications as of 12/13/2018  Medication Sig  . buPROPion (WELLBUTRIN XL) 300 MG 24 hr tablet Take 1 tablet (300 mg total) by mouth daily.  . cetirizine (ZYRTEC) 10 MG tablet Take 10 mg by mouth daily as needed for allergies.   . Cholecalciferol (VITAMIN D3) 1000 units CAPS Take 1 capsule by mouth daily.  . cyclobenzaprine (FLEXERIL) 10 MG tablet Take 10 mg by mouth 3 (three) times daily as needed for muscle spasms.  Marland Kitchen esomeprazole (NEXIUM) 40 MG capsule Take 1 capsule (40 mg total) by mouth daily at 12 noon.  Marland Kitchen estradiol (ESTRACE) 0.5 MG tablet Take 1 tablet (0.5 mg total) by mouth daily.  . fluticasone (FLONASE) 50 MCG/ACT nasal spray Place 1 spray into both nostrils daily as needed  for allergies.  Marland Kitchen guaiFENesin (MUCINEX) 600 MG 12 hr tablet Take 600 mg by mouth 2 (two) times daily as needed for cough.   Marland Kitchen lisinopril-hydrochlorothiazide (PRINZIDE,ZESTORETIC) 20-12.5 MG tablet Take 1 tablet by mouth every morning.  Marland Kitchen LORazepam (ATIVAN) 1 MG tablet Take 1 tablet (1 mg total) by mouth 2 (two) times daily. TAKE 1 TABLET EVERY 8 HOURS AS NEEDED FOR ANXIETY  . meclizine (ANTIVERT) 25 MG tablet Take 1 tablet (25 mg total) by mouth 3 (three) times daily as needed for dizziness.  . medroxyPROGESTERone (PROVERA) 2.5 MG tablet TAKE 1 TABLET BY MOUTH EVERY DAY  . meloxicam (MOBIC) 7.5 MG tablet Take 1 tablet (7.5 mg total) by mouth as needed.  . Multiple Vitamin (MULTIVITAMIN) tablet Take 1 tablet by mouth daily.    . polyethylene glycol powder (GLYCOLAX/MIRALAX) powder Take 1 Container by mouth as needed for mild constipation.   . sertraline (ZOLOFT) 50 MG tablet Take 50 mg by mouth daily.   No facility-administered encounter medications on file as of 12/13/2018.     Allergies as of 12/13/2018 - Review Complete 12/13/2018  Allergen Reaction Noted  . Amoxapine and related  07/11/2015  . Cephalexin Rash 10/10/2007  . Oxycodone  12/05/2013  . Penicillins Rash 10/10/2007    Past Medical History:  Diagnosis Date  . Allergy   . Anemia   . Anxiety   . Arthritis   . Cataract    bil cataracts removed  . Colon polyp   . Complete heart block ([place])   .  Contusion of chest wall 10/10/2007   Centricity Description: CHEST WALL CONTUSION Qualifier: Diagnosis of  By: Aline Brochure MD, Dorothyann Peng   Centricity Description: CONTUSION, RIGHT RIB Qualifier: Diagnosis of  By: Aline Brochure MD, Dorothyann Peng    . Depression   . Diverticulitis   . Esophagitis   . ESOPHAGITIS 06/25/2008   Qualifier: History of  By: Nelson-Smith CMA (AAMA), Dottie    . GERD (gastroesophageal reflux disease)   . H/O seasonal allergies   . Heart murmur   . Hiatal hernia   . Hyperlipidemia    s/p PPM implant by Dr Olevia Perches 1992 with  generator change 2004 ([facility]), she is device dependant  . Hypertension   . Mitral valve prolapse   . Pacemaker     Past Surgical History:  Procedure Laterality Date  . APPENDECTOMY    . BREAST EXCISIONAL BIOPSY Right   . BREAST SURGERY Right    biopsy  . COLONOSCOPY  10/19/2004   [facility]  . EYE SURGERY Left    cataract extraction with IOL  . LAPAROSCOPIC LOW ANTERIOR RESECTION  11/16/2013   Robotic  . LAPAROSCOPIC LYSIS OF ADHESIONS N/A 11/15/2013   Procedure: LAPAROSCOPIC LYSIS OF ADHESIONS;  Surgeon: Adin Hector, MD;  Location: [facility];  Service: General;  Laterality: N/A;  . left ovary cyst removal  1972  . [place], 2004, 2012   device dependant  . POLYPECTOMY    . PROCTOSCOPY  11/15/2013   Procedure: RIGID PROCTOSCOPY;  Surgeon: Adin Hector, MD;  Location: [facility];  Service: General;;  . ROBOTIC ASSISTED SALPINGO OOPHERECTOMY Left 11/16/2013   Robotic en bloc w LAR resection  . TUBAL LIGATION    . UPPER GASTROINTESTINAL ENDOSCOPY      Family History  Problem Relation Age of Onset  . Diabetes Mother   . Stroke Mother   . Diabetes Father   . Lung cancer Father   . Lung cancer Maternal Grandfather   . Heart disease Maternal Aunt   . Heart disease Maternal Grandmother   . Breast cancer Maternal Aunt   . Colon cancer Neg Hx   . Esophageal cancer Neg Hx   . Pancreatic cancer Neg Hx   . Rectal cancer Neg Hx   . Stomach cancer Neg Hx     Social History   Socioeconomic History  . Marital status: Married    Spouse name: Not on file  . Number of children: 1  . Years of education: Not on file  . Highest education level: Not on file  Occupational History  . Occupation: REGISTRATION  Social Needs  . Financial resource strain: Not on file  . Food insecurity    Worry: Not on file    Inability: Not on file  . Transportation needs    Medical: Not on file    Non-medical: Not on file  Tobacco Use  . Smoking status: Never Smoker  . Smokeless tobacco:  Never Used  Substance and Sexual Activity  . Alcohol use: No  . Drug use: No  . Sexual activity: Not on file  Lifestyle  . Physical activity    Days per week: Not on file    Minutes per session: Not on file  . Stress: Not on file  Relationships  . Social Herbalist on phone: Not on file    Gets together: Not on file    Attends religious service: Not on file    Active member of club or organization: Not on  file    Attends meetings of clubs or organizations: Not on file    Relationship status: Not on file  . Intimate partner violence    Fear of current or ex partner: Not on file    Emotionally abused: Not on file    Physically abused: Not on file    Forced sexual activity: Not on file  Other Topics Concern  . 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   Physical Exam: Vitals were not taken and physical exam was not performed during this virtual visit.  Data Reviewed:  Reviewed labs, radiology imaging, old records and pertinent past GI work up   Assessment and Plan/Recommendations:  68 year old female with history of chronic GERD Continue Nexium 40 mg daily, 30 minutes before breakfast Discussed antireflux measures Avoid NSAIDs Stop taking Zantac Follow-up in 2 years or sooner if needed     K. Denzil Magnuson , MD   CC: Terald Sleeper, PA-C

## 2018-12-13 NOTE — Patient Instructions (Addendum)
Continue Nexium 40 mg daily, 30 minutes before breakfast.  Please send refill for 2 years ( We have sent refills to your pharmacy)   Stop taking Zantac  Antireflux measures  Follow-up in 2 years   Gastroesophageal Reflux Disease, Adult Gastroesophageal reflux (GER) happens when acid from the stomach flows up into the tube that connects the mouth and the stomach (esophagus). Normally, food travels down the esophagus and stays in the stomach to be digested. However, when a person has GER, food and stomach acid sometimes move back up into the esophagus. If this becomes a more serious problem, the person may be diagnosed with a disease called gastroesophageal reflux disease (GERD). GERD occurs when the reflux:  Happens often.  Causes frequent or severe symptoms.  Causes problems such as damage to the esophagus. When stomach acid comes in contact with the esophagus, the acid may cause soreness (inflammation) in the esophagus. Over time, GERD may create small holes (ulcers) in the lining of the esophagus. What are the causes? This condition is caused by a problem with the muscle between the esophagus and the stomach (lower esophageal sphincter, or LES). Normally, the LES muscle closes after food passes through the esophagus to the stomach. When the LES is weakened or abnormal, it does not close properly, and that allows food and stomach acid to go back up into the esophagus. The LES can be weakened by certain dietary substances, medicines, and medical conditions, including:  Tobacco use.  Pregnancy.  Having a hiatal hernia.  Alcohol use.  Certain foods and beverages, such as coffee, chocolate, onions, and peppermint. What increases the risk? You are more likely to develop this condition if you:  Have an increased body weight.  Have a connective tissue disorder.  Use NSAID medicines. What are the signs or symptoms? Symptoms of this condition include:  Heartburn.  Difficult or  painful swallowing.  The feeling of having a lump in the throat.  Abitter taste in the mouth.  Bad breath.  Having a large amount of saliva.  Having an upset or bloated stomach.  Belching.  Chest pain. Different conditions can cause chest pain. Make sure you see your health care provider if you experience chest pain.  Shortness of breath or wheezing.  Ongoing (chronic) cough or a night-time cough.  Wearing away of tooth enamel.  Weight loss. How is this diagnosed? Your health care provider will take a medical history and perform a physical exam. To determine if you have mild or severe GERD, your health care provider may also monitor how you respond to treatment. You may also have tests, including:  A test to examine your stomach and esophagus with a small camera (endoscopy).  A test thatmeasures the acidity level in your esophagus.  A test thatmeasures how much pressure is on your esophagus.  A barium swallow or modified barium swallow test to show the shape, size, and functioning of your esophagus. How is this treated? The goal of treatment is to help relieve your symptoms and to prevent complications. Treatment for this condition may vary depending on how severe your symptoms are. Your health care provider may recommend:  Changes to your diet.  Medicine.  Surgery. Follow these instructions at home: Eating and drinking   Follow a diet as recommended by your health care provider. This may involve avoiding foods and drinks such as: ? Coffee and tea (with or without caffeine). ? Drinks that containalcohol. ? Energy drinks and sports drinks. ? Carbonated drinks or  sodas. ? Chocolate and cocoa. ? Peppermint and mint flavorings. ? Garlic and onions. ? Horseradish. ? Spicy and acidic foods, including peppers, chili powder, curry powder, vinegar, hot sauces, and barbecue sauce. ? Citrus fruit juices and citrus fruits, such as oranges, lemons, and  limes. ? Tomato-based foods, such as red sauce, chili, salsa, and pizza with red sauce. ? Fried and fatty foods, such as donuts, french fries, potato chips, and high-fat dressings. ? High-fat meats, such as hot dogs and fatty cuts of red and white meats, such as rib eye steak, sausage, ham, and bacon. ? High-fat dairy items, such as whole milk, butter, and cream cheese.  Eat small, frequent meals instead of large meals.  Avoid drinking large amounts of liquid with your meals.  Avoid eating meals during the 2-3 hours before bedtime.  Avoid lying down right after you eat.  Do not exercise right after you eat. Lifestyle   Do not use any products that contain nicotine or tobacco, such as cigarettes, e-cigarettes, and chewing tobacco. If you need help quitting, ask your health care provider.  Try to reduce your stress by using methods such as yoga or meditation. If you need help reducing stress, ask your health care provider.  If you are overweight, reduce your weight to an amount that is healthy for you. Ask your health care provider for guidance about a safe weight loss goal. General instructions  Pay attention to any changes in your symptoms.  Take over-the-counter and prescription medicines only as told by your health care provider. Do not take aspirin, ibuprofen, or other NSAIDs unless your health care provider told you to do so.  Wear loose-fitting clothing. Do not wear anything tight around your waist that causes pressure on your abdomen.  Raise (elevate) the head of your bed about 6 inches (15 cm).  Avoid bending over if this makes your symptoms worse.  Keep all follow-up visits as told by your health care provider. This is important. Contact a health care provider if:  You have: ? New symptoms. ? Unexplained weight loss. ? Difficulty swallowing or it hurts to swallow. ? Wheezing or a persistent cough. ? A hoarse voice.  Your symptoms do not improve with  treatment. Get help right away if you:  Have pain in your arms, neck, jaw, teeth, or back.  Feel sweaty, dizzy, or light-headed.  Have chest pain or shortness of breath.  Vomit and your vomit looks like blood or coffee grounds.  Faint.  Have stool that is bloody or black.  Cannot swallow, drink, or eat. Summary  Gastroesophageal reflux happens when acid from the stomach flows up into the esophagus. GERD is a disease in which the reflux happens often, causes frequent or severe symptoms, or causes problems such as damage to the esophagus.  Treatment for this condition may vary depending on how severe your symptoms are. Your health care provider may recommend diet and lifestyle changes, medicine, or surgery.  Contact a health care provider if you have new or worsening symptoms.  Take over-the-counter and prescription medicines only as told by your health care provider. Do not take aspirin, ibuprofen, or other NSAIDs unless your health care provider told you to do so.  Keep all follow-up visits as told by your health care provider. This is important. This information is not intended to replace advice given to you by your health care provider. Make sure you discuss any questions you have with your health care provider. Document Released: 03/17/2005 Document  Revised: 12/14/2017 Document Reviewed: 12/14/2017 Elsevier Interactive Patient Education  [employer].

## 2018-12-19 ENCOUNTER — Encounter: Payer: Self-pay | Admitting: Gastroenterology

## 2019-01-08 ENCOUNTER — Ambulatory Visit (INDEPENDENT_AMBULATORY_CARE_PROVIDER_SITE_OTHER): Payer: Medicare Other | Admitting: Orthopedic Surgery

## 2019-01-08 ENCOUNTER — Other Ambulatory Visit: Payer: Self-pay

## 2019-01-08 ENCOUNTER — Encounter: Payer: Self-pay | Admitting: Orthopedic Surgery

## 2019-01-08 VITALS — BP 161/104 | HR 48 | Temp 97.6°F | Ht 61.0 in | Wt 135.0 lb

## 2019-01-08 DIAGNOSIS — G8929 Other chronic pain: Secondary | ICD-10-CM

## 2019-01-08 DIAGNOSIS — M25561 Pain in right knee: Secondary | ICD-10-CM

## 2019-01-08 NOTE — Patient Instructions (Signed)
Joint Steroid Injection A joint steroid injection is a procedure to relieve swelling and pain in a joint. Steroids are medicines that reduce inflammation. In this procedure, your health care provider uses a syringe and a needle to inject a steroid medicine into a painful and inflamed joint. A pain-relieving medicine (anesthetic) may be injected along with the steroid. In some cases, your health care provider may use an imaging technique such as ultrasound or fluoroscopy to guide the injection. Joints that are often treated with steroid injections include the knee, shoulder, hip, and spine. These injections may also be used in the elbow, ankle, and joints of the hands or feet. You may have joint steroid injections as part of your treatment for inflammation caused by:  Gout.  Rheumatoid arthritis.  Advanced wear-and-tear arthritis (osteoarthritis).  Tendinitis.  Bursitis. Joint steroid injections may be repeated, but having them too often can damage a joint or the skin over the joint. You should not have joint steroid injections less than 6 weeks apart or more than four times a year. Tell a health care provider about:  Any allergies you have.  All medicines you are taking, including vitamins, herbs, eye drops, creams, and over-the-counter medicines.  Any problems you or family members have had with anesthetic medicines.  Any blood disorders you have.  Any surgeries you have had.  Any medical conditions you have.  Whether you are pregnant or may be pregnant. What are the risks? Generally, this is a safe treatment. However, problems may occur, including:  Infection.  Bleeding.  Allergic reactions to medicines.  Damage to the joint or tissues around the joint.  Thinning of skin or loss of skin color over the joint.  Temporary flushing of the face or chest.  Temporary increase in pain.  Temporary increase in blood sugar.  Failure to relieve inflammation or pain. What  happens before the treatment?  You may have imaging tests of your joint.  Ask your health care provider about: ? Changing or stopping your regular medicines. This is especially important if you are taking diabetes medicines or blood thinners. ? Taking medicines such as aspirin and ibuprofen. These medicines can thin your blood. Do not take these medicines unless your health care provider tells you to take them. ? Taking over-the-counter medicines, vitamins, herbs, and supplements.  Ask your health care provider if you can drive yourself home after the procedure. What happens during the treatment?   Your health care provider will position you for the injection and locate the injection site over your joint.  The skin over the joint will be cleaned with a germ-killing soap.  Your health care provider may: ? Spray a numbing solution (topical anesthetic) over the injection site. ? Inject a local anesthetic under the skin above your joint.  The needle will be placed through your skin into your joint. Your health care provider may use imaging to guide the needle to the right spot for the injection. If imaging is used, a special contrast dye may be injected to confirm that the needle is in the correct location.  The steroid medicine will be injected into your joint.  Anesthetic may be injected along with the steroid. This may be a medicine that relieves pain for a short time (short-acting anesthetic) or for a longer time (long-acting anesthetic).  The needle will be removed, and an adhesive bandage (dressing) will be placed over the injection site. The procedure may vary among health care providers and hospitals. What can I   expect after the treatment?  You will be able to go home after the treatment.  It is normal to feel slight flushing for a few days after the injection.  After the treatment, it is common to have an increase in joint pain after the anesthetic has worn off. This may  happen about an hour after a short-acting anesthetic or about 8 hours after a longer-acting anesthetic.  You should begin to feel relief from joint pain and swelling after 24 to 48 hours. Follow these instructions at home: Injection site care  Leave the adhesive dressing over your injection site in place until your health care provider says you can remove it.  Check your injection site every day for signs of infection. Check for: ? Redness, swelling, or pain. ? Fluid or blood. ? Warmth. ? Pus or a bad smell. Activity  Return to your normal activities as told by your health care provider. Ask your health care provider what activities are safe for you. You may be asked to limit activities that put stress on the joint for a few days.  Do joint exercises as told by your health care provider.  Do not take baths, swim, or use a hot tub until your health care provider approves. Managing pain, stiffness, and swelling   If directed, put ice on the joint. ? Put ice in a plastic bag. ? Place a towel between your skin and the bag. ? Leave the ice on for 20 minutes, 2-3 times a day.  Raise (elevate) your joint above the level of your heart when you are sitting or lying down. General instructions  Take over-the-counter and prescription medicines only as told by your health care provider.  Do not use any products that contain nicotine or tobacco, such as cigarettes, e-cigarettes, and chewing tobacco. These can delay joint healing. If you need help quitting, ask your health care provider.  If you have diabetes, be aware that your blood sugar may be slightly elevated for several days after the injection.  Keep all follow-up visits as told by your health care provider. This is important. Contact a health care provider if you have:  Chills or a fever.  Any signs of infection at your injection site.  Increased pain or swelling or no relief after 2 days. Summary  A joint steroid injection  is a treatment to relieve pain and swelling in a joint.  Steroids are medicines that reduce inflammation. Your health care provider may add an anesthetic along with the steroid.  You may have joint steroid injections as part of your arthritis treatment.  Joint steroid injections may be repeated, but having them too often can damage a joint or the skin over the joint.  Contact your health care provider if you have a fever, chills, or signs of infection or if you get no relief from joint pain or swelling. This information is not intended to replace advice given to you by your health care provider. Make sure you discuss any questions you have with your health care provider. Document Released: 02/07/2018 Document Revised: 02/07/2018 Document Reviewed: 02/07/2018 Elsevier Patient Education  2020 [employer] Inc[employer]  

## 2019-01-08 NOTE — Progress Notes (Signed)
Chief Complaint  Patient presents with  . Follow-up    Recheck on right knee pain. REQUESTS INJECTION    Status post 1 injection for right knee pain possible meniscal tear  Did well until a week or 2 ago pain came back while walking through [place] in for requested injection right knee  No effusion medial joint line tenderness full range of motion   Procedure note right knee injection   verbal consent was obtained to inject right knee joint  Timeout was completed to confirm the site of injection  The medications used were 40 mg of Depo-Medrol and 1% lidocaine 3 cc  Anesthesia was provided by ethyl chloride and the skin was prepped with alcohol.  After cleaning the skin with alcohol a 20-gauge needle was used to inject the right knee joint. There were no complications. A sterile bandage was applied.  Encounter Diagnosis  Name Primary?  . Chronic pain of right knee Yes

## 2019-01-15 ENCOUNTER — Ambulatory Visit (INDEPENDENT_AMBULATORY_CARE_PROVIDER_SITE_OTHER): Payer: Medicare Other | Admitting: *Deleted

## 2019-01-15 DIAGNOSIS — I442 Atrioventricular block, complete: Secondary | ICD-10-CM

## 2019-01-15 DIAGNOSIS — I429 Cardiomyopathy, unspecified: Secondary | ICD-10-CM

## 2019-01-16 LAB — CUP PACEART REMOTE DEVICE CHECK
Battery Remaining Longevity: 23 mo
Battery Remaining Percentage: 17 %
Battery Voltage: 2.75 V
Brady Statistic AS VP Percent: 97 %
Brady Statistic AS VS Percent: 1 %
Brady Statistic RV Percent Paced: 97 %
Date Time Interrogation Session: 20200727060925
Implantable Lead Implant Date: 19920319
Implantable Lead Implant Date: 19920319
Implantable Lead Location: 753859
Implantable Lead Location: 753860
Implantable Pulse Generator Implant Date: 20120803
Lead Channel Impedance Value: 490 Ohm
Lead Channel Impedance Value: 540 Ohm
Lead Channel Pacing Threshold Amplitude: 1.5 V
Lead Channel Pacing Threshold Pulse Width: 0.5 ms
Lead Channel Sensing Intrinsic Amplitude: 2.1 mV
Lead Channel Sensing Intrinsic Amplitude: 8.3 mV
Lead Channel Setting Pacing Amplitude: 1.75 V
Lead Channel Setting Pacing Pulse Width: 0.5 ms
Lead Channel Setting Sensing Sensitivity: 4 mV
Pulse Gen Model: 2210
Pulse Gen Serial Number: 7252855

## 2019-01-30 NOTE — Progress Notes (Signed)
Remote pacemaker transmission.   

## 2019-02-05 DIAGNOSIS — H35373 Puckering of macula, bilateral: Secondary | ICD-10-CM | POA: Diagnosis not present

## 2019-02-05 DIAGNOSIS — H43812 Vitreous degeneration, left eye: Secondary | ICD-10-CM | POA: Diagnosis not present

## 2019-02-05 DIAGNOSIS — H10413 Chronic giant papillary conjunctivitis, bilateral: Secondary | ICD-10-CM | POA: Diagnosis not present

## 2019-02-05 DIAGNOSIS — H26491 Other secondary cataract, right eye: Secondary | ICD-10-CM | POA: Diagnosis not present

## 2019-02-27 ENCOUNTER — Encounter: Payer: Self-pay | Admitting: Family Medicine

## 2019-02-27 ENCOUNTER — Other Ambulatory Visit: Payer: Self-pay

## 2019-02-27 ENCOUNTER — Ambulatory Visit (INDEPENDENT_AMBULATORY_CARE_PROVIDER_SITE_OTHER): Payer: Medicare Other | Admitting: Family Medicine

## 2019-02-27 VITALS — Temp 97.0°F

## 2019-02-27 DIAGNOSIS — Z20828 Contact with and (suspected) exposure to other viral communicable diseases: Secondary | ICD-10-CM | POA: Diagnosis not present

## 2019-02-27 DIAGNOSIS — J0110 Acute frontal sinusitis, unspecified: Secondary | ICD-10-CM | POA: Diagnosis not present

## 2019-02-27 DIAGNOSIS — Z20822 Contact with and (suspected) exposure to covid-19: Secondary | ICD-10-CM

## 2019-02-27 DIAGNOSIS — R6889 Other general symptoms and signs: Secondary | ICD-10-CM | POA: Diagnosis not present

## 2019-02-27 MED ORDER — DOXYCYCLINE HYCLATE 100 MG PO TABS: 100.0000 mg | ORAL_TABLET | Freq: Two times a day (BID) | ORAL | 0 refills | Status: AC

## 2019-02-27 NOTE — Progress Notes (Signed)
Telephone visit  Subjective: CC: sinus infection PCP: Terald Sleeper, PA-C [phone] Carmen Green is a 68 y.o. female calls for telephone consult today. Patient provides verbal consent for consult held via phone.  Location of patient: home Location of provider: [facility] Others present for call: none  1. Sinus infection Patient reports that she was exposed to a positive COVID friend.  She reports drainage and sinus fullness 2-3 weeks.  She is taking Mucinex, Flonase and Advil.  Denies cough, congestion, fever.  She reports low energy.  No myalgia, diarrhea, nausea.   ROS: Per HPI  Allergies  Allergen Reactions  . Amoxapine And Related     Rash  . Cephalexin Rash  . Oxycodone     "Loopy"  . Penicillins Rash   Past Medical History:  Diagnosis Date  . Allergy   . Anemia   . Anxiety   . Arthritis   . Cataract    bil cataracts removed  . Colon polyp   . Complete heart block ([place])   . Contusion of chest wall 10/10/2007   Centricity Description: CHEST WALL CONTUSION Qualifier: Diagnosis of  By: Aline Brochure MD, Dorothyann Peng   Centricity Description: CONTUSION, RIGHT RIB Qualifier: Diagnosis of  By: Aline Brochure MD, Dorothyann Peng    . Depression   . Diverticulitis   . Esophagitis   . ESOPHAGITIS 06/25/2008   Qualifier: History of  By: Nelson-Smith CMA (AAMA), Dottie    . GERD (gastroesophageal reflux disease)   . H/O seasonal allergies   . Heart murmur   . Hiatal hernia   . 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 Hypertension   . Mitral valve prolapse   . Pacemaker     Current Outpatient Medications:  .  buPROPion (WELLBUTRIN XL) 300 MG 24 hr tablet, Take 1 tablet (300 mg total) by mouth daily., Disp: 90 tablet, Rfl: 3 .  cetirizine (ZYRTEC) 10 MG tablet, Take 10 mg by mouth daily as needed for allergies. , Disp: , Rfl:  .  Cholecalciferol (VITAMIN D3) 1000 units CAPS, Take 1 capsule by mouth daily., Disp: , Rfl:  .   cyclobenzaprine (FLEXERIL) 10 MG tablet, Take 10 mg by mouth 3 (three) times daily as needed for muscle spasms., Disp: , Rfl:  .  esomeprazole (NEXIUM) 40 MG capsule, Take 1 capsule (40 mg total) by mouth daily at 12 noon., Disp: 90 capsule, Rfl: 3 .  estradiol (ESTRACE) 0.5 MG tablet, Take 1 tablet (0.5 mg total) by mouth daily., Disp: 90 tablet, Rfl: 3 .  fluticasone (FLONASE) 50 MCG/ACT nasal spray, Place 1 spray into both nostrils daily as needed for allergies., Disp: 48 g, Rfl: 3 .  guaiFENesin (MUCINEX) 600 MG 12 hr tablet, Take 600 mg by mouth 2 (two) times daily as needed for cough. , Disp: , Rfl:  .  lisinopril-hydrochlorothiazide (PRINZIDE,ZESTORETIC) 20-12.5 MG tablet, Take 1 tablet by mouth every morning., Disp: 90 tablet, Rfl: 3 .  LORazepam (ATIVAN) 1 MG tablet, Take 1 tablet (1 mg total) by mouth 2 (two) times daily. TAKE 1 TABLET EVERY 8 HOURS AS NEEDED FOR ANXIETY, Disp: 180 tablet, Rfl: 1 .  meclizine (ANTIVERT) 25 MG tablet, Take 1 tablet (25 mg total) by mouth 3 (three) times daily as needed for dizziness., Disp: 30 tablet, Rfl: 0 .  medroxyPROGESTERone (PROVERA) 2.5 MG tablet, TAKE 1 TABLET BY MOUTH EVERY DAY, Disp: 90 tablet, Rfl: 3 .  meloxicam (MOBIC)  7.5 MG tablet, Take 1 tablet (7.5 mg total) by mouth as needed., Disp: 90 tablet, Rfl: 2 .  Multiple Vitamin (MULTIVITAMIN) tablet, Take 1 tablet by mouth daily.  , Disp: , Rfl:  .  polyethylene glycol powder (GLYCOLAX/MIRALAX) powder, Take 1 Container by mouth as needed for mild constipation. , Disp: , Rfl:  .  sertraline (ZOLOFT) 50 MG tablet, Take 50 mg by mouth daily., Disp: , Rfl:   Vitals:   02/27/19 1335  Temp: (!) 97 F (36.1 C)   Assessment/ Plan: 68 y.o. female   1. Acute non-recurrent frontal sinusitis Since this is been going on for almost 3 weeks I will empirically treated with oral antibiotics for possible sinus infection due to bacterial infection.  However, I do think that it is worth getting her tested for  COVID-19 and therefore this is been ordered.  I advised her of the hours and availability of [facility] testing sites.  She will head over to the [facility] site now.  Proceed with isolation precautions until she has received word about her result - Novel Coronavirus, NAA ([facility]) - doxycycline (VIBRA-TABS) 100 MG tablet; Take 1 tablet (100 mg total) by mouth 2 (two) times daily for 10 days.  Dispense: 20 tablet; Refill: 0  2. Exposure to Covid-19 Virus - Novel Coronavirus, NAA ([facility])   Start time: 1:33pm End time: 1:38pm  Total time spent on patient care (including telephone call/ virtual visit): 15 minutes  [place], [place] [phone]

## 2019-02-27 NOTE — Patient Instructions (Signed)
Prevent the Spread of COVID-19 if You Are Sick If you are sick with COVID-19 or think you might have COVID-19, follow the steps below to help protect other people in your home and community. Stay home except to get medical care.  Stay home. Most people with COVID-19 have mild illness and are able to recover at home without medical care. Do not leave your home, except to get medical care. Do not visit public areas.  Take care of yourself. Get rest and stay hydrated.  Get medical care when needed. Call your doctor before you go to their office for care. But, if you have trouble breathing or other concerning symptoms, call 911 for immediate help.  Avoid public transportation, ride-sharing, or taxis. Separate yourself from other people and pets in your home.  As much as possible, stay in a specific room and away from other people and pets in your home. Also, you should use a separate bathroom, if available. If you need to be around other people or animals in or outside of the home, wear a cloth face covering. ? See COVID-19 and Animals if you have questions about pets: https://www.cdc.gov/coronavirus/2019-ncov/faq.html#COVID19animals Monitor your symptoms.  Common symptoms of COVID-19 include fever and cough. Trouble breathing is a more serious symptom that means you should get medical attention.  Follow care instructions from your healthcare provider and local health department. Your local health authorities will give instructions on checking your symptoms and reporting information. If you develop emergency warning signs for COVID-19 get medical attention immediately.  Emergency warning signs include*:  Trouble breathing  Persistent pain or pressure in the chest  New confusion or not able to be woken  Bluish lips or face *This list is not all inclusive. Please consult your medical provider for any other symptoms that are severe or concerning to you. Call 911 if you have a medical  emergency. If you have a medical emergency and need to call 911, notify the operator that you have or think you might have, COVID-19. If possible, put on a facemask before medical help arrives. Call ahead before visiting your doctor.  Call ahead. Many medical visits for routine care are being postponed or done by phone or telemedicine.  If you have a medical appointment that cannot be postponed, call your doctor's office. This will help the office protect themselves and other patients. If you are sick, wear a cloth covering over your nose and mouth.  You should wear a cloth face covering over your nose and mouth if you must be around other people or animals, including pets (even at home).  You don't need to wear the cloth face covering if you are alone. If you can't put on a cloth face covering (because of trouble breathing for example), cover your coughs and sneezes in some other way. Try to stay at least 6 feet away from other people. This will help protect the people around you. Note: During the COVID-19 pandemic, medical grade facemasks are reserved for healthcare workers and some first responders. You may need to make a cloth face covering using a scarf or bandana. Cover your coughs and sneezes.  Cover your mouth and nose with a tissue when you cough or sneeze.  Throw used tissues in a lined trash can.  Immediately wash your hands with soap and water for at least 20 seconds. If soap and water are not available, clean your hands with an alcohol-based hand sanitizer that contains at least 60% alcohol. Clean your hands often.    Wash your hands often with soap and water for at least 20 seconds. This is especially important after blowing your nose, coughing, or sneezing; going to the bathroom; and before eating or preparing food.  Use hand sanitizer if soap and water are not available. Use an alcohol-based hand sanitizer with at least 60% alcohol, covering all surfaces of your hands and rubbing  them together until they feel dry.  Soap and water are the best option, especially if your hands are visibly dirty.  Avoid touching your eyes, nose, and mouth with unwashed hands. Avoid sharing personal household items.  Do not share dishes, drinking glasses, cups, eating utensils, towels, or bedding with other people in your home.  Wash these items thoroughly after using them with soap and water or put them in the dishwasher. Clean all "high-touch" surfaces everyday.  Clean and disinfect high-touch surfaces in your "sick room" and bathroom. Let someone else clean and disinfect surfaces in common areas, but not your bedroom and bathroom.  If a caregiver or other person needs to clean and disinfect a sick person's bedroom or bathroom, they should do so on an as-needed basis. The caregiver/other person should wear a mask and wait as long as possible after the sick person has used the bathroom. High-touch surfaces include phones, remote controls, counters, tabletops, doorknobs, bathroom fixtures, toilets, keyboards, tablets, and bedside tables.  Clean and disinfect areas that may have blood, stool, or body fluids on them.  Use household cleaners and disinfectants. Clean the area or item with soap and water or another detergent if it is dirty. Then use a household disinfectant. ? Be sure to follow the instructions on the label to ensure safe and effective use of the product. Many products recommend keeping the surface wet for several minutes to ensure germs are killed. Many also recommend precautions such as wearing gloves and making sure you have good ventilation during use of the product. ? Most EPA-registered household disinfectants should be effective. How to discontinue home isolation  People with COVID-19 who have stayed home (home isolated) can stop home isolation under the following conditions: ? If you will not have a test to determine if you are still contagious, you can leave home  after these three things have happened:  You have had no fever for at least 72 hours (that is three full days of no fever without the use of medicine that reduces fevers) AND  other symptoms have improved (for example, when your cough or shortness of breath has improved) AND  at least 10 days have passed since your symptoms first appeared. ? If you will be tested to determine if you are still contagious, you can leave home after these three things have happened:  You no longer have a fever (without the use of medicine that reduces fevers) AND  other symptoms have improved (for example, when your cough or shortness of breath has improved) AND  you received two negative tests in a row, 24 hours apart. Your doctor will follow CDC guidelines. In all cases, follow the guidance of your healthcare provider and local health department. The decision to stop home isolation should be made in consultation with your healthcare provider and state and local health departments. Local decisions depend on local circumstances. cdc.gov/coronavirus 10/22/2018 This information is not intended to replace advice given to you by your health care provider. Make sure you discuss any questions you have with your health care provider. Document Released: 10/03/2018 Document Revised: 11/01/2018 Document Reviewed: 10/03/2018   Elsevier Patient Education  2020 [employer]vier Inc.  

## 2019-03-01 LAB — NOVEL CORONAVIRUS, NAA: SARS-CoV-2, NAA: NOT DETECTED

## 2019-04-03 ENCOUNTER — Other Ambulatory Visit: Payer: Self-pay | Admitting: Physician Assistant

## 2019-04-10 DIAGNOSIS — H26491 Other secondary cataract, right eye: Secondary | ICD-10-CM | POA: Diagnosis not present

## 2019-04-16 ENCOUNTER — Ambulatory Visit (INDEPENDENT_AMBULATORY_CARE_PROVIDER_SITE_OTHER): Payer: Medicare Other | Admitting: *Deleted

## 2019-04-16 DIAGNOSIS — I442 Atrioventricular block, complete: Secondary | ICD-10-CM | POA: Diagnosis not present

## 2019-04-16 DIAGNOSIS — I429 Cardiomyopathy, unspecified: Secondary | ICD-10-CM

## 2019-04-17 LAB — CUP PACEART REMOTE DEVICE CHECK
Battery Remaining Longevity: 16 mo
Battery Remaining Percentage: 12 %
Battery Voltage: 2.72 V
Brady Statistic AS VP Percent: 96 %
Brady Statistic AS VS Percent: 1.1 %
Brady Statistic RV Percent Paced: 96 %
Date Time Interrogation Session: 20201026064114
Implantable Lead Implant Date: 19920319
Implantable Lead Implant Date: 19920319
Implantable Lead Location: 753859
Implantable Lead Location: 753860
Implantable Pulse Generator Implant Date: 20120803
Lead Channel Impedance Value: 400 Ohm
Lead Channel Impedance Value: 540 Ohm
Lead Channel Pacing Threshold Amplitude: 1.375 V
Lead Channel Pacing Threshold Pulse Width: 0.5 ms
Lead Channel Sensing Intrinsic Amplitude: 2.3 mV
Lead Channel Sensing Intrinsic Amplitude: 9.9 mV
Lead Channel Setting Pacing Amplitude: 1.625
Lead Channel Setting Pacing Pulse Width: 0.5 ms
Lead Channel Setting Sensing Sensitivity: 4 mV
Pulse Gen Model: 2210
Pulse Gen Serial Number: 7252855

## 2019-04-24 ENCOUNTER — Other Ambulatory Visit: Payer: Self-pay | Admitting: Physician Assistant

## 2019-04-24 DIAGNOSIS — F411 Generalized anxiety disorder: Secondary | ICD-10-CM

## 2019-05-04 ENCOUNTER — Ambulatory Visit: Payer: Medicare Other

## 2019-05-04 NOTE — Progress Notes (Signed)
Remote pacemaker transmission.   

## 2019-05-05 ENCOUNTER — Other Ambulatory Visit: Payer: Self-pay | Admitting: Physician Assistant

## 2019-05-10 ENCOUNTER — Ambulatory Visit: Payer: Medicare Other

## 2019-05-14 ENCOUNTER — Encounter: Payer: Self-pay | Admitting: Physician Assistant

## 2019-05-14 ENCOUNTER — Ambulatory Visit (INDEPENDENT_AMBULATORY_CARE_PROVIDER_SITE_OTHER): Payer: Medicare Other | Admitting: Physician Assistant

## 2019-05-14 DIAGNOSIS — F411 Generalized anxiety disorder: Secondary | ICD-10-CM | POA: Diagnosis not present

## 2019-05-14 MED ORDER — LORAZEPAM 1 MG PO TABS: 1.0000 mg | ORAL_TABLET | Freq: Two times a day (BID) | ORAL | 1 refills | Status: DC

## 2019-05-14 MED ORDER — BUSPIRONE HCL 10 MG PO TABS: 10.0000 mg | ORAL_TABLET | Freq: Three times a day (TID) | ORAL | 2 refills | Status: DC

## 2019-05-20 NOTE — Progress Notes (Signed)
Telephone visit  Subjective: [id number] PCP: Terald Sleeper, PA-C [id number] Carmen Green is a 68 y.o. female calls for telephone consult today. Patient provides verbal consent for consult held via phone.  Patient is identified with 2 separate identifiers.  At this time the entire area is on COVID-19 social distancing and stay home orders are in place.  Patient is of higher risk and therefore we are performing this by a virtual method.  Location of patient: home Location of provider: HOME Others present for call: no  ANXIETY ASSESSMENT Cause of anxiety: GAD This patient returns for a  month recheck on narcotic use for the above named condition(s)  Current medications-lorazepam 1 mg twice daily Bupropion 300 mg 1 daily The patient has taken BuSpar in the past.  We will restart this.  Other medications tried: SSRI Medication side effects- none Any concerns- no Any change in general medical condition- no Effectiveness of current meds- good PMP AWARE website reviewed: Yes Any suspicious activity on PMP Aware: No LME daily dose: 3  Contract on file: due Last UDS  Due at next in office visit  History of overdose or risk of abuse no  GAD 7 : Generalized Anxiety Score 05/20/2019  Nervous, Anxious, on Edge 2  Control/stop worrying 2  Worry too much - different things 2  Trouble relaxing 1  Restless 1  Easily annoyed or irritable 2  Afraid - awful might happen 2  Total GAD 7 Score 12  Anxiety Difficulty Somewhat difficult        ROS: Per HPI  Allergies  Allergen Reactions  . Amoxapine And Related     Rash  . Zoloft [Sertraline Hcl]     Nausea   . Cephalexin Rash  . Oxycodone     "Loopy"  . Penicillins Rash   Past Medical History:  Diagnosis Date  . Allergy   . Anemia   . Anxiety   . Arthritis   . Cataract    bil cataracts removed  . Colon polyp   . Complete heart block ([place])   . Contusion of chest wall 10/10/2007   Centricity Description: CHEST WALL  CONTUSION Qualifier: Diagnosis of  By: Aline Brochure MD, Dorothyann Peng   Centricity Description: CONTUSION, RIGHT RIB Qualifier: Diagnosis of  By: Aline Brochure MD, Dorothyann Peng    . Depression   . Diverticulitis   . Esophagitis   . ESOPHAGITIS 06/25/2008   Qualifier: History of  By: Nelson-Smith CMA (AAMA), Dottie    . GERD (gastroesophageal reflux disease)   . H/O seasonal allergies   . Heart murmur   . Hiatal hernia   . 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 Hypertension   . Mitral valve prolapse   . Pacemaker     Current Outpatient Medications:  .  buPROPion (WELLBUTRIN XL) 300 MG 24 hr tablet, Take 1 tablet (300 mg total) by mouth daily., Disp: 90 tablet, Rfl: 3 .  busPIRone (BUSPAR) 10 MG tablet, Take 1 tablet (10 mg total) by mouth 3 (three) times daily., Disp: 90 tablet, Rfl: 2 .  cetirizine (ZYRTEC) 10 MG tablet, Take 10 mg by mouth daily as needed for allergies. , Disp: , Rfl:  .  Cholecalciferol (VITAMIN D3) 1000 units CAPS, Take 1 capsule by mouth daily., Disp: , Rfl:  .  cyclobenzaprine (FLEXERIL) 10 MG tablet, TAKE 1 TABLET THREE TIMES DAILY AS NEEDED FOR MUSCLE SPASM, Disp: 30 tablet,  Rfl: 0 .  esomeprazole (NEXIUM) 40 MG capsule, Take 1 capsule (40 mg total) by mouth daily at 12 noon., Disp: 90 capsule, Rfl: 3 .  estradiol (ESTRACE) 0.5 MG tablet, TAKE 1 TABLET BY MOUTH DAILY, Disp: 90 tablet, Rfl: 0 .  fluticasone (FLONASE) 50 MCG/ACT nasal spray, Place 1 spray into both nostrils daily as needed for allergies., Disp: 48 g, Rfl: 3 .  guaiFENesin (MUCINEX) 600 MG 12 hr tablet, Take 600 mg by mouth 2 (two) times daily as needed for cough. , Disp: , Rfl:  .  lisinopril-hydrochlorothiazide (ZESTORETIC) 20-12.5 MG tablet, Take 1 tablet by mouth every morning. (Needs to be seen before next refill), Disp: 90 tablet, Rfl: 0 .  LORazepam (ATIVAN) 1 MG tablet, Take 1 tablet (1 mg total) by mouth 2 (two) times daily. TAKE 1 TABLET  EVERY 8 HOURS AS NEEDED FOR ANXIETY, Disp: 180 tablet, Rfl: 1 .  meclizine (ANTIVERT) 25 MG tablet, Take 1 tablet (25 mg total) by mouth 3 (three) times daily as needed for dizziness., Disp: 30 tablet, Rfl: 0 .  medroxyPROGESTERone (PROVERA) 2.5 MG tablet, TAKE 1 TABLET BY MOUTH EVERY DAY, Disp: 90 tablet, Rfl: 3 .  meloxicam (MOBIC) 7.5 MG tablet, Take 1 tablet (7.5 mg total) by mouth as needed., Disp: 90 tablet, Rfl: 2 .  Multiple Vitamin (MULTIVITAMIN) tablet, Take 1 tablet by mouth daily.  , Disp: , Rfl:  .  polyethylene glycol powder (GLYCOLAX/MIRALAX) powder, Take 1 Container by mouth as needed for mild constipation. , Disp: , Rfl:   Assessment/ Plan: 68 y.o. female   1. Anxiety state - LORazepam (ATIVAN) 1 MG tablet; Take 1 tablet (1 mg total) by mouth 2 (two) times daily. TAKE 1 TABLET EVERY 8 HOURS AS NEEDED FOR ANXIETY  Dispense: 180 tablet; Refill: 1 - busPIRone (BUSPAR) 10 MG tablet; Take 1 tablet (10 mg total) by mouth 3 (three) times daily.  Dispense: 90 tablet; Refill: 2   Return in about 6 months (around 11/11/2019).  Continue all other maintenance medications as listed above.  Start time: 10:41 AM End time: 10:51 AM  Meds ordered this encounter  Medications  . LORazepam (ATIVAN) 1 MG tablet    Sig: Take 1 tablet (1 mg total) by mouth 2 (two) times daily. TAKE 1 TABLET EVERY 8 HOURS AS NEEDED FOR ANXIETY    Dispense:  180 tablet    Refill:  1    Order Specific Question:   Supervising Provider    Answer:   [name] [id number]  . busPIRone (BUSPAR) 10 MG tablet    Sig: Take 1 tablet (10 mg total) by mouth 3 (three) times daily.    Dispense:  90 tablet    Refill:  2    Order Specific Question:   Supervising Provider    Answer:   [name] [id number]    Particia Nearing PA-C [place] [phone]

## 2019-05-24 ENCOUNTER — Other Ambulatory Visit: Payer: Self-pay | Admitting: Physician Assistant

## 2019-05-24 ENCOUNTER — Telehealth: Payer: Self-pay | Admitting: Physician Assistant

## 2019-05-24 DIAGNOSIS — Z0000 Encounter for general adult medical examination without abnormal findings: Secondary | ICD-10-CM

## 2019-05-24 DIAGNOSIS — I10 Essential (primary) hypertension: Secondary | ICD-10-CM

## 2019-05-24 MED ORDER — LISINOPRIL-HYDROCHLOROTHIAZIDE 20-12.5 MG PO TABS: 1.0000 | ORAL_TABLET | Freq: Every morning | ORAL | 0 refills | Status: DC

## 2019-05-24 NOTE — Telephone Encounter (Signed)
Pt had visit for anxiety 05/14/19, last OV for HTN was 09/2018 and over the phone. Pt hasn't had blood work since 06/2017. Does she ntbs in office to get refill?

## 2019-05-24 NOTE — Telephone Encounter (Signed)
Pt aware of provider feedback and voiced understanding. 

## 2019-05-24 NOTE — Telephone Encounter (Signed)
I have placed orders for her to come in and have labs performed at her convenience.  I did send in a prescription for one-time of her Zestoretic.  I sent it to [facility].

## 2019-05-24 NOTE — Telephone Encounter (Signed)
What is the name of the medication? lisinopril-hydrochlorothiazide (ZESTORETIC) 20-12.5 MG tablet  Have you contacted your pharmacy to request a refill? No, pt has a televisit for med refill on 05/14/2019 with [facility] would you like this sent to? [facility]. Pt wants a 90 day supply. Please did not send to mail order because is completely out.   Patient notified that their request is being sent to the clinical staff for review and that they should receive a call once it is complete. If they do not receive a call within 24 hours they can check with their pharmacy or our office.

## 2019-06-12 ENCOUNTER — Other Ambulatory Visit: Payer: Self-pay

## 2019-06-12 ENCOUNTER — Other Ambulatory Visit: Payer: Medicare Other

## 2019-06-12 DIAGNOSIS — Z0000 Encounter for general adult medical examination without abnormal findings: Secondary | ICD-10-CM | POA: Diagnosis not present

## 2019-06-12 DIAGNOSIS — I10 Essential (primary) hypertension: Secondary | ICD-10-CM

## 2019-06-13 LAB — CMP14+EGFR
ALT: 11 IU/L (ref 0–32)
AST: 22 IU/L (ref 0–40)
Albumin/Globulin Ratio: 2.1 (ref 1.2–2.2)
Albumin: 4.5 g/dL (ref 3.8–4.8)
Alkaline Phosphatase: 53 IU/L (ref 39–117)
BUN/Creatinine Ratio: 11 — ABNORMAL LOW (ref 12–28)
BUN: 9 mg/dL (ref 8–27)
Bilirubin Total: 0.6 mg/dL (ref 0.0–1.2)
CO2: 24 mmol/L (ref 20–29)
Calcium: 9.1 mg/dL (ref 8.7–10.3)
Chloride: 100 mmol/L (ref 96–106)
Creatinine, Ser: 0.81 mg/dL (ref 0.57–1.00)
GFR calc Af Amer: 85 mL/min/{1.73_m2} (ref >59)
GFR calc non Af Amer: 74 mL/min/{1.73_m2} (ref >59)
Globulin, Total: 2.1 g/dL (ref 1.5–4.5)
Glucose: 91 mg/dL (ref 65–99)
Potassium: 3.9 mmol/L (ref 3.5–5.2)
Sodium: 135 mmol/L (ref 134–144)
Total Protein: 6.6 g/dL (ref 6.0–8.5)

## 2019-06-13 LAB — LIPID PANEL
Chol/HDL Ratio: 3.6 ratio (ref 0.0–4.4)
Cholesterol, Total: 174 mg/dL (ref 100–199)
HDL: 48 mg/dL (ref >39)
LDL Chol Calc (NIH): 112 mg/dL — ABNORMAL HIGH (ref 0–99)
Triglycerides: 77 mg/dL (ref 0–149)
VLDL Cholesterol Cal: 14 mg/dL (ref 5–40)

## 2019-06-13 LAB — CBC WITH DIFFERENTIAL/PLATELET
Basophils Absolute: 0.1 10*3/uL (ref 0.0–0.2)
Basos: 2 %
EOS (ABSOLUTE): 0.1 10*3/uL (ref 0.0–0.4)
Eos: 3 %
Hematocrit: 38.2 % (ref 34.0–46.6)
Hemoglobin: 12.9 g/dL (ref 11.1–15.9)
Immature Grans (Abs): 0 10*3/uL (ref 0.0–0.1)
Immature Granulocytes: 0 %
Lymphocytes Absolute: 1.5 10*3/uL (ref 0.7–3.1)
Lymphs: 38 %
MCH: 32.9 pg (ref 26.6–33.0)
MCHC: 33.8 g/dL (ref 31.5–35.7)
MCV: 97 fL (ref 79–97)
Monocytes Absolute: 0.4 10*3/uL (ref 0.1–0.9)
Monocytes: 11 %
Neutrophils Absolute: 1.8 10*3/uL (ref 1.4–7.0)
Neutrophils: 46 %
Platelets: 199 10*3/uL (ref 150–450)
RBC: 3.92 x10E6/uL (ref 3.77–5.28)
RDW: 11.3 % — ABNORMAL LOW (ref 11.7–15.4)
WBC: 3.9 10*3/uL (ref 3.4–10.8)

## 2019-06-13 LAB — TSH: TSH: 1.25 u[IU]/mL (ref 0.450–4.500)

## 2019-07-16 ENCOUNTER — Ambulatory Visit (INDEPENDENT_AMBULATORY_CARE_PROVIDER_SITE_OTHER): Payer: Medicare Other | Admitting: *Deleted

## 2019-07-16 DIAGNOSIS — I442 Atrioventricular block, complete: Secondary | ICD-10-CM | POA: Diagnosis not present

## 2019-07-17 LAB — CUP PACEART REMOTE DEVICE CHECK
Battery Remaining Longevity: 8 mo
Battery Remaining Percentage: 6 %
Battery Voltage: 2.68 V
Brady Statistic AS VP Percent: 95 %
Brady Statistic AS VS Percent: 1.5 %
Brady Statistic RV Percent Paced: 96 %
Date Time Interrogation Session: 20210125020922
Implantable Lead Implant Date: 19920319
Implantable Lead Implant Date: 19920319
Implantable Lead Location: 753859
Implantable Lead Location: 753860
Implantable Pulse Generator Implant Date: 20120803
Lead Channel Impedance Value: 440 Ohm
Lead Channel Impedance Value: 510 Ohm
Lead Channel Pacing Threshold Amplitude: 1.625 V
Lead Channel Pacing Threshold Pulse Width: 0.5 ms
Lead Channel Sensing Intrinsic Amplitude: 2.1 mV
Lead Channel Sensing Intrinsic Amplitude: 8.7 mV
Lead Channel Setting Pacing Amplitude: 1.875
Lead Channel Setting Pacing Pulse Width: 0.5 ms
Lead Channel Setting Sensing Sensitivity: 4 mV
Pulse Gen Model: 2210
Pulse Gen Serial Number: 7252855

## 2019-07-27 ENCOUNTER — Other Ambulatory Visit: Payer: Self-pay | Admitting: Physician Assistant

## 2019-07-27 DIAGNOSIS — Z1231 Encounter for screening mammogram for malignant neoplasm of breast: Secondary | ICD-10-CM

## 2019-08-06 ENCOUNTER — Other Ambulatory Visit: Payer: Self-pay

## 2019-08-06 ENCOUNTER — Ambulatory Visit: Payer: Medicare Other | Admitting: Student

## 2019-08-06 ENCOUNTER — Encounter: Payer: Self-pay | Admitting: Student

## 2019-08-06 VITALS — BP 138/72 | HR 89 | Ht 61.0 in | Wt 142.4 lb

## 2019-08-06 DIAGNOSIS — X32XXXA Exposure to sunlight, initial encounter: Secondary | ICD-10-CM | POA: Diagnosis not present

## 2019-08-06 DIAGNOSIS — I442 Atrioventricular block, complete: Secondary | ICD-10-CM | POA: Diagnosis not present

## 2019-08-06 DIAGNOSIS — I10 Essential (primary) hypertension: Secondary | ICD-10-CM

## 2019-08-06 DIAGNOSIS — I059 Rheumatic mitral valve disease, unspecified: Secondary | ICD-10-CM

## 2019-08-06 DIAGNOSIS — I340 Nonrheumatic mitral (valve) insufficiency: Secondary | ICD-10-CM

## 2019-08-06 DIAGNOSIS — I429 Cardiomyopathy, unspecified: Secondary | ICD-10-CM | POA: Diagnosis not present

## 2019-08-06 DIAGNOSIS — L570 Actinic keratosis: Secondary | ICD-10-CM | POA: Diagnosis not present

## 2019-08-06 LAB — CUP PACEART INCLINIC DEVICE CHECK
Battery Remaining Longevity: 6 mo
Battery Voltage: 2.66 V
Brady Statistic RA Percent Paced: 0 %
Brady Statistic RV Percent Paced: 95 %
Date Time Interrogation Session: 20210215120156
Implantable Lead Implant Date: 19920319
Implantable Lead Implant Date: 19920319
Implantable Lead Location: 753859
Implantable Lead Location: 753860
Implantable Pulse Generator Implant Date: 20120803
Lead Channel Impedance Value: 487.5 Ohm
Lead Channel Impedance Value: 512.5 Ohm
Lead Channel Pacing Threshold Amplitude: 1.375 V
Lead Channel Pacing Threshold Pulse Width: 0.5 ms
Lead Channel Sensing Intrinsic Amplitude: 1.5 mV
Lead Channel Sensing Intrinsic Amplitude: 9 mV
Lead Channel Setting Pacing Amplitude: 1.625
Lead Channel Setting Pacing Pulse Width: 0.5 ms
Lead Channel Setting Sensing Sensitivity: 4 mV
Pulse Gen Model: 2210
Pulse Gen Serial Number: 7252855

## 2019-08-06 NOTE — Progress Notes (Signed)
Electrophysiology Office Note Date: 08/06/2019  ID:  Carmen Green, DOB [birth year], MRN [id number]  PCP: Terald Sleeper, PA-C Electrophysiologist: Dr. Rayann Heman   CC: Pacemaker follow-up  Carmen Green is a 69 y.o. female seen today for Dr. Rayann Heman . she presents today for routine electrophysiology followup.  Since last being seen in our clinic, the patient reports doing very well.  she denies chest pain, palpitations, dyspnea, PND, orthopnea, nausea, vomiting, dizziness, syncope, edema, weight gain, or early satiety. High stress reported at home. Her husband recently had a stroke and lost part of his vision. He is still getting used to it and having to take medicines.   Device History: St. Jude Dual Chamber PPM implanted 1992 for CHB, gen change 2004, gen change 2012.  Past Medical History:  Diagnosis Date  . Allergy   . Anemia   . Anxiety   . Arthritis   . Cataract    bil cataracts removed  . Colon polyp   . Complete heart block ([place])   . Contusion of chest wall 10/10/2007   Centricity Description: CHEST WALL CONTUSION Qualifier: Diagnosis of  By: Aline Brochure MD, Dorothyann Peng   Centricity Description: CONTUSION, RIGHT RIB Qualifier: Diagnosis of  By: Aline Brochure MD, Dorothyann Peng    . Depression   . Diverticulitis   . Esophagitis   . ESOPHAGITIS 06/25/2008   Qualifier: History of  By: Nelson-Smith CMA (AAMA), Dottie    . GERD (gastroesophageal reflux disease)   . H/O seasonal allergies   . Heart murmur   . Hiatal hernia   . 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 Hypertension   . Mitral valve prolapse   . Pacemaker    Past Surgical History:  Procedure Laterality Date  . APPENDECTOMY    . BREAST EXCISIONAL BIOPSY Right   . BREAST SURGERY Right    biopsy  . COLONOSCOPY  10/19/2004   LEC  . EYE SURGERY Left    cataract extraction with IOL  . LAPAROSCOPIC LOW ANTERIOR RESECTION  11/16/2013   Robotic  .  LAPAROSCOPIC LYSIS OF ADHESIONS N/A 11/15/2013   Procedure: LAPAROSCOPIC LYSIS OF ADHESIONS;  Surgeon: Adin Hector, MD;  Location: [facility];  Service: General;  Laterality: N/A;  . left ovary cyst removal  1972  . [place], 2004, 2012   device dependant  . POLYPECTOMY    . PROCTOSCOPY  11/15/2013   Procedure: RIGID PROCTOSCOPY;  Surgeon: Adin Hector, MD;  Location: [facility];  Service: General;;  . ROBOTIC ASSISTED SALPINGO OOPHERECTOMY Left 11/16/2013   Robotic en bloc w LAR resection  . TUBAL LIGATION    . UPPER GASTROINTESTINAL ENDOSCOPY      Current Outpatient Medications  Medication Sig Dispense Refill  . buPROPion (WELLBUTRIN XL) 300 MG 24 hr tablet Take 1 tablet (300 mg total) by mouth daily. 90 tablet 3  . busPIRone (BUSPAR) 10 MG tablet Take 1 tablet (10 mg total) by mouth 3 (three) times daily. 90 tablet 2  . cetirizine (ZYRTEC) 10 MG tablet Take 10 mg by mouth daily as needed for allergies.     . Cholecalciferol (VITAMIN D3) 1000 units CAPS Take 1 capsule by mouth daily.    . cyclobenzaprine (FLEXERIL) 10 MG tablet TAKE 1 TABLET THREE TIMES DAILY AS NEEDED FOR MUSCLE SPASM 30 tablet 0  . esomeprazole (NEXIUM) 40 MG capsule Take 1 capsule (40 mg total)  by mouth daily at 12 noon. 90 capsule 3  . estradiol (ESTRACE) 0.5 MG tablet TAKE 1 TABLET BY MOUTH DAILY 90 tablet 0  . fluticasone (FLONASE) 50 MCG/ACT nasal spray Place 1 spray into both nostrils daily as needed for allergies. 48 g 3  . guaiFENesin (MUCINEX) 600 MG 12 hr tablet Take 600 mg by mouth 2 (two) times daily as needed for cough.     Marland Kitchen lisinopril-hydrochlorothiazide (ZESTORETIC) 20-12.5 MG tablet Take 1 tablet by mouth every morning. (Needs to be seen before next refill) 90 tablet 0  . LORazepam (ATIVAN) 1 MG tablet Take 1 tablet (1 mg total) by mouth 2 (two) times daily. TAKE 1 TABLET EVERY 8 HOURS AS NEEDED FOR ANXIETY 180 tablet 1  . meclizine (ANTIVERT) 25 MG tablet Take 1 tablet (25 mg total) by  mouth 3 (three) times daily as needed for dizziness. 30 tablet 0  . medroxyPROGESTERone (PROVERA) 2.5 MG tablet TAKE 1 TABLET BY MOUTH EVERY DAY 90 tablet 3  . meloxicam (MOBIC) 7.5 MG tablet Take 1 tablet (7.5 mg total) by mouth as needed. 90 tablet 2  . Multiple Vitamin (MULTIVITAMIN) tablet Take 1 tablet by mouth daily.      . polyethylene glycol powder (GLYCOLAX/MIRALAX) powder Take 1 Container by mouth as needed for mild constipation.      No current facility-administered medications for this visit.    Allergies:   Amoxapine and related, Zoloft [sertraline hcl], Cephalexin, Oxycodone, and Penicillins   Social History: Social History   Socioeconomic History  . Marital status: Married    Spouse name: Not on file  . Number of children: 1  . Years of education: Not on file  . Highest education level: Not on file  Occupational History  . Occupation: REGISTRATION  Tobacco Use  . Smoking status: Never Smoker  . Smokeless tobacco: Never Used  Substance and Sexual Activity  . Alcohol use: No  . Drug use: No  . Sexual activity: Not on file  Other Topics Concern  . Not on file  Social History Narrative   Lives with spouse in [place]   Works for an orthopoedic office in [place] Strain:   . Difficulty of Paying Living Expenses: Not on file  Food Insecurity:   . Worried About Charity fundraiser in the Last Year: Not on file  . Ran Out of Food in the Last Year: Not on file  Transportation Needs:   . Lack of Transportation (Medical): Not on file  . Lack of Transportation (Non-Medical): Not on file  Physical Activity:   . Days of Exercise per Week: Not on file  . Minutes of Exercise per Session: Not on file  Stress:   . Feeling of Stress : Not on file  Social Connections:   . Frequency of Communication with Friends and Family: Not on file  . Frequency of Social Gatherings with Friends and Family: Not on file  .  Attends Religious Services: Not on file  . Active Member of Clubs or Organizations: Not on file  . Attends Archivist Meetings: Not on file  . Marital Status: Not on file  Intimate Partner Violence:   . Fear of Current or Ex-Partner: Not on file  . Emotionally Abused: Not on file  . Physically Abused: Not on file  . Sexually Abused: Not on file    Family History: Family History  Problem  Relation Age of Onset  . Diabetes Mother   . Stroke Mother   . Diabetes Father   . Lung cancer Father   . Lung cancer Maternal Grandfather   . Heart disease Maternal Aunt   . Heart disease Maternal Grandmother   . Breast cancer Maternal Aunt   . Colon cancer Neg Hx   . Esophageal cancer Neg Hx   . Pancreatic cancer Neg Hx   . Rectal cancer Neg Hx   . Stomach cancer Neg Hx      Review of Systems: All other systems reviewed and are otherwise negative except as noted above.  Physical Exam: Vitals:   08/06/19 1133  BP: 138/72  Pulse: 89  SpO2: 92%  Weight: 142 lb 6.4 oz (64.6 kg)  Height: 5\' 1"  (1.549 m)     GEN- The patient is well appearing, alert and oriented x 3 today.   HEENT: normocephalic, atraumatic; sclera clear, conjunctiva pink; hearing intact; oropharynx clear; neck supple  Lungs- Clear to ausculation bilaterally, normal work of breathing.  No wheezes, rales, rhonchi Heart- Regular rate and rhythm, no murmurs, rubs or gallops  GI- soft, non-tender, non-distended, bowel sounds present  Extremities- no clubbing, cyanosis, or edema  MS- no significant deformity or atrophy Skin- warm and dry, no rash or lesion; PPM pocket well healed Psych- euthymic mood, full affect Neuro- strength and sensation are intact  PPM Interrogation- reviewed in detail today,  See PACEART report  EKG:  EKG is ordered today. The ekg ordered today shows V paced at 136 ms  Recent Labs: 06/12/2019: ALT 11; BUN 9; Creatinine, Ser 0.81; Hemoglobin 12.9; Platelets 199; Potassium 3.9; Sodium  135; TSH 1.250   Wt Readings from Last 3 Encounters:  08/06/19 142 lb 6.4 oz (64.6 kg)  01/08/19 135 lb (61.2 kg)  12/13/18 143 lb (64.9 kg)     Other studies Reviewed: Additional studies/ records that were reviewed today include: Previous EP office notes, Previous remote checks, Most recent labwork.   Assessment and Plan:  1.  CHB s/p St. Jude PPM  Normal PPM function See Pace Art report No changes today Atrial lead noise previously noted. Leads overall stable (from 1992). Will continue to follow with remotes.  Would be OK to schedule gen change without repeat office visit if within year, but patient would prefer to discuss virtually with Dr. Rayann Heman prior to. Will schedule for 6 months.   2. HTN Stable No change today  3. MR  Mild by echo 07/2017 Will repeat.  Stable clinically  Current medicines are reviewed at length with the patient today.   The patient does not have concerns regarding her medicines.  The following changes were made today:  none  Labs/ tests ordered today include:  No orders of the defined types were placed in this encounter.    Disposition: Repeat Echo. Follow up with Dr. Rayann Heman in 6 months to discuss gen change (virtually).     Jacalyn Lefevre, PA-C  08/06/2019 11:46 AM  [facility] [address] [place] [address] [phone] (office) [phone] (fax)

## 2019-08-06 NOTE — Patient Instructions (Signed)
Medication Instructions:  none *If you need a refill on your cardiac medications before your next appointment, please call your pharmacy*  Lab Work: none If you have labs (blood work) drawn today and your tests are completely normal, you will receive your results only by: Marland Kitchen MyChart Message (if you have MyChart) OR . A paper copy in the mail If you have any lab test that is abnormal or we need to change your treatment, we will call you to review the results.  Testing/Procedures: Your physician has requested that you have an echocardiogram. Echocardiography is a painless test that uses sound waves to create images of your heart. It provides your doctor with information about the size and shape of your heart and how well your heart's chambers and valves are working. This procedure takes approximately one hour. There are no restrictions for this procedure.    Follow-Up: At [facility], you and your health needs are our priority.  As part of our continuing mission to provide you with exceptional heart care, we have created designated Provider Care Teams.  These Care Teams include your primary Cardiologist (physician) and Advanced Practice Providers (APPs -  Physician Assistants and Nurse Practitioners) who all work together to provide you with the care you need, when you need it.  Your next appointment:   6 month(s)  The format for your next appointment:   Virtual Visit   Provider:   Dr Rayann Heman  Other Instructions

## 2019-08-08 DIAGNOSIS — H5213 Myopia, bilateral: Secondary | ICD-10-CM | POA: Diagnosis not present

## 2019-08-16 ENCOUNTER — Other Ambulatory Visit (HOSPITAL_COMMUNITY): Payer: Medicare Other

## 2019-08-17 ENCOUNTER — Ambulatory Visit (HOSPITAL_COMMUNITY): Payer: Medicare Other | Attending: Internal Medicine

## 2019-08-17 ENCOUNTER — Other Ambulatory Visit: Payer: Self-pay

## 2019-08-17 DIAGNOSIS — I059 Rheumatic mitral valve disease, unspecified: Secondary | ICD-10-CM

## 2019-08-17 DIAGNOSIS — I340 Nonrheumatic mitral (valve) insufficiency: Secondary | ICD-10-CM

## 2019-08-17 MED ORDER — PERFLUTREN LIPID MICROSPHERE
1.0000 mL | INTRAVENOUS | Status: AC | PRN
  Administered 2019-08-17: 1 mL via INTRAVENOUS

## 2019-09-05 ENCOUNTER — Telehealth: Payer: Self-pay

## 2019-09-05 NOTE — Telephone Encounter (Signed)
Spoke with patient to remind of missed remote transmission 

## 2019-09-06 ENCOUNTER — Ambulatory Visit: Payer: Medicare Other

## 2019-09-21 ENCOUNTER — Telehealth: Payer: Self-pay

## 2019-09-21 NOTE — Telephone Encounter (Signed)
Pt called wanting help on sending a transmission. I assured her to do the transmission while we werent on the phone since her monitor is hooked to her landline. I will call her back once I receive it.

## 2019-09-21 NOTE — Telephone Encounter (Signed)
We have received that transmission and pt aware. Letter will be sent out for next transmission date

## 2019-10-10 ENCOUNTER — Telehealth: Payer: Self-pay | Admitting: Internal Medicine

## 2019-10-10 NOTE — Telephone Encounter (Signed)
Patient transmission was received

## 2019-10-10 NOTE — Telephone Encounter (Signed)
Patient requesting to speak with Cheri Kearns in regards to her transmission. She states they had a problem last month sending in one and she wants to make sure this month's was received.

## 2019-10-12 ENCOUNTER — Telehealth: Payer: Self-pay | Admitting: *Deleted

## 2019-10-12 NOTE — Telephone Encounter (Signed)
Patient called regarding unscheduled transmission sent in on 10/10/19 for battery assessment. Explained that transmission shows ERI is estimated in <3 months. Explained will continue monthly battery checks for now, next scheduled for 11/02/19. Schedule updated in [place] to reflect q31 day automatic transmissions. Pt is aware that once ERI is reached, she will be scheduled for virtual visit or in-clinic visit with Dr. Rayann Heman to discuss gen change (per her preference). Pt reports she will need advanced notice prior to procedure so she can arrange for someone to drive her home as her husband does not drive in [place] since his stroke. Encouraged pt to call back with any further questions or concerns prior to ERI. Pt verbalizes understanding.

## 2019-10-15 ENCOUNTER — Ambulatory Visit (INDEPENDENT_AMBULATORY_CARE_PROVIDER_SITE_OTHER): Payer: Medicare Other | Admitting: *Deleted

## 2019-10-15 DIAGNOSIS — I442 Atrioventricular block, complete: Secondary | ICD-10-CM

## 2019-10-15 LAB — CUP PACEART REMOTE DEVICE CHECK
Battery Remaining Longevity: 1 mo
Battery Remaining Percentage: 1 %
Battery Voltage: 2.63 V
Brady Statistic AS VP Percent: 92 %
Brady Statistic AS VS Percent: 3.9 %
Brady Statistic RV Percent Paced: 92 %
Date Time Interrogation Session: 20210426034229
Implantable Lead Implant Date: 19920319
Implantable Lead Implant Date: 19920319
Implantable Lead Location: 753859
Implantable Lead Location: 753860
Implantable Pulse Generator Implant Date: 20120803
Lead Channel Impedance Value: 440 Ohm
Lead Channel Impedance Value: 530 Ohm
Lead Channel Pacing Threshold Amplitude: 1.75 V
Lead Channel Pacing Threshold Pulse Width: 0.5 ms
Lead Channel Sensing Intrinsic Amplitude: 2 mV
Lead Channel Sensing Intrinsic Amplitude: 9 mV
Lead Channel Setting Pacing Amplitude: 2 V
Lead Channel Setting Pacing Pulse Width: 0.5 ms
Lead Channel Setting Sensing Sensitivity: 4 mV
Pulse Gen Model: 2210
Pulse Gen Serial Number: 7252855

## 2019-10-15 NOTE — Progress Notes (Signed)
PPM Remote  

## 2019-11-01 ENCOUNTER — Ambulatory Visit: Payer: Medicare Other

## 2019-11-13 ENCOUNTER — Telehealth: Payer: Self-pay | Admitting: Physician Assistant

## 2019-11-13 DIAGNOSIS — F411 Generalized anxiety disorder: Secondary | ICD-10-CM

## 2019-11-13 MED ORDER — BUPROPION HCL ER (XL) 300 MG PO TB24: 300.0000 mg | ORAL_TABLET | Freq: Every day | ORAL | 0 refills | Status: DC

## 2019-11-13 NOTE — Telephone Encounter (Signed)
  Prescription Request  11/13/2019  What is the name of the medication or equipment? Wellbutrin  Have you contacted your pharmacy to request a refill? (if applicable) Yes  Which pharmacy would you like this sent to? [facility]  Pts next appt is with Dr Lajuana Ripple on 12/04/19. Pt is almost out and is requesting that enough of the medicine be sent to pharmacy to last her until she comes in for her appt.   Patient notified that their request is being sent to the clinical staff for review and that they should receive a response within 2 business days.

## 2019-11-13 NOTE — Telephone Encounter (Signed)
30 day supply of Wellbutrin sent to [facility] until patient is seen by Dr. Lajuana Ripple on 12/04/2019.  Patient aware.

## 2019-11-14 ENCOUNTER — Ambulatory Visit (INDEPENDENT_AMBULATORY_CARE_PROVIDER_SITE_OTHER): Payer: Medicare Other | Admitting: *Deleted

## 2019-11-14 DIAGNOSIS — I442 Atrioventricular block, complete: Secondary | ICD-10-CM

## 2019-11-14 LAB — CUP PACEART REMOTE DEVICE CHECK
Battery Remaining Longevity: 1 mo
Battery Remaining Percentage: 0.5 %
Battery Voltage: 2.6 V
Brady Statistic AS VP Percent: 91 %
Brady Statistic AS VS Percent: 4.8 %
Brady Statistic RV Percent Paced: 91 %
Date Time Interrogation Session: 20210526035304
Implantable Lead Implant Date: 19920319
Implantable Lead Implant Date: 19920319
Implantable Lead Location: 753859
Implantable Lead Location: 753860
Implantable Pulse Generator Implant Date: 20120803
Lead Channel Impedance Value: 410 Ohm
Lead Channel Impedance Value: 510 Ohm
Lead Channel Pacing Threshold Amplitude: 1.625 V
Lead Channel Pacing Threshold Pulse Width: 0.5 ms
Lead Channel Sensing Intrinsic Amplitude: 1.9 mV
Lead Channel Sensing Intrinsic Amplitude: 12 mV
Lead Channel Setting Pacing Amplitude: 1.875
Lead Channel Setting Pacing Pulse Width: 0.5 ms
Lead Channel Setting Sensing Sensitivity: 4 mV
Pulse Gen Model: 2210
Pulse Gen Serial Number: 7252855

## 2019-11-15 ENCOUNTER — Other Ambulatory Visit: Payer: Self-pay | Admitting: Family Medicine

## 2019-11-15 ENCOUNTER — Other Ambulatory Visit: Payer: Self-pay

## 2019-11-15 ENCOUNTER — Ambulatory Visit
Admission: RE | Admit: 2019-11-15 | Discharge: 2019-11-15 | Disposition: A | Discharge status: Home / Self Care | Payer: Medicare Other | Source: Ambulatory Visit | Attending: Physician Assistant | Admitting: Physician Assistant

## 2019-11-15 DIAGNOSIS — Z1231 Encounter for screening mammogram for malignant neoplasm of breast: Secondary | ICD-10-CM

## 2019-11-15 NOTE — Progress Notes (Signed)
Remote pacemaker transmission.   

## 2019-12-03 ENCOUNTER — Telehealth: Payer: Self-pay | Admitting: Emergency Medicine

## 2019-12-03 DIAGNOSIS — I442 Atrioventricular block, complete: Secondary | ICD-10-CM

## 2019-12-03 NOTE — Telephone Encounter (Signed)
Patient made aware of device reaching ERI. Will schedule for in person visit with Dr Rayann Heman or EP AP to discuss generator change.

## 2019-12-03 NOTE — Telephone Encounter (Signed)
Go ahead and schedule generator change. I can do virtual visit between then and now.

## 2019-12-04 ENCOUNTER — Other Ambulatory Visit: Payer: Self-pay

## 2019-12-04 ENCOUNTER — Ambulatory Visit (INDEPENDENT_AMBULATORY_CARE_PROVIDER_SITE_OTHER): Payer: Medicare Other | Admitting: Family Medicine

## 2019-12-04 VITALS — BP 125/62 | HR 65 | Temp 97.8°F | Ht 61.0 in | Wt 140.0 lb

## 2019-12-04 DIAGNOSIS — Z7689 Persons encountering health services in other specified circumstances: Secondary | ICD-10-CM

## 2019-12-04 DIAGNOSIS — Z79899 Other long term (current) drug therapy: Secondary | ICD-10-CM | POA: Diagnosis not present

## 2019-12-04 DIAGNOSIS — F411 Generalized anxiety disorder: Secondary | ICD-10-CM | POA: Diagnosis not present

## 2019-12-04 DIAGNOSIS — I10 Essential (primary) hypertension: Secondary | ICD-10-CM

## 2019-12-04 DIAGNOSIS — Z23 Encounter for immunization: Secondary | ICD-10-CM

## 2019-12-04 DIAGNOSIS — Z780 Asymptomatic menopausal state: Secondary | ICD-10-CM

## 2019-12-04 MED ORDER — BUSPIRONE HCL 10 MG PO TABS: 10.0000 mg | ORAL_TABLET | Freq: Three times a day (TID) | ORAL | 2 refills | Status: DC

## 2019-12-04 MED ORDER — LORAZEPAM 1 MG PO TABS: 0.5000 mg | ORAL_TABLET | Freq: Two times a day (BID) | ORAL | 1 refills | Status: DC | PRN

## 2019-12-04 MED ORDER — LISINOPRIL-HYDROCHLOROTHIAZIDE 20-12.5 MG PO TABS: 1.0000 | ORAL_TABLET | Freq: Every morning | ORAL | 3 refills | Status: DC

## 2019-12-04 MED ORDER — BUPROPION HCL ER (XL) 300 MG PO TB24: 300.0000 mg | ORAL_TABLET | Freq: Every day | ORAL | 3 refills | Status: DC

## 2019-12-04 NOTE — Progress Notes (Signed)
Subjective: CC: Establish care, hypertension, generalized anxiety disorder with panic and depression PCP: [name], DO Carmen Green is a 69 y.o. female presenting to clinic today for:  1.  Hypertension Reports compliance with lisinopril-hydrochlorothiazide.  No chest pain, shortness of breath, lower extremity edema  2.  Depressive disorder/anxiety Patient reports worsening anxiety and depression.  She finds that she is a bit overwhelmed by caring for her husband, who recently sustained a CVA and her disabled daughter.  She reports some moodiness and feeling like she needs to have an outburst when anxiety gets severe.  She does use the Ativan nightly and often will use 1/2 tablet in the morning and half a tablet in the afternoons, which is a change for her from just at bedtime.  She has been using the buspirone 5 mg every morning, in the afternoon and 10 mg at bedtime.  She was worried about taking too much medication.  She is not willing to see a counselor at this point.  However, she does report good support through her church.  3.  Postmenopausal estrogen deficiency Patient has been on Provera and Estrace for many years.  This was started by her previous PCP.  She does not follow-up with OB/GYN.  She does still have her uterus intact.  No abnormal vaginal bleeding.  She has about 3 days of medication left.  She is interested in tapering off of this medicine.  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  ROS: Per HPI  Allergies  Allergen Reactions   Amoxapine And Related     Rash   Zoloft [Sertraline Hcl]     Nausea    Cephalexin Rash   Oxycodone     "Loopy"   Penicillins Rash   Past Medical History:  Diagnosis Date   Allergy    Anemia    Anxiety    Arthritis    Cataract    bil cataracts removed   Colon polyp    Complete heart block ([facility])    Contusion of chest wall 10/10/2007   Centricity Description: CHEST  WALL CONTUSION Qualifier: Diagnosis of  By: Aline Brochure MD, Dorothyann Peng   Centricity Description: CONTUSION, RIGHT RIB Qualifier: Diagnosis of  By: Aline Brochure MD, Stanley     Depression    Diverticulitis    Esophagitis    ESOPHAGITIS 06/25/2008   Qualifier: History of  By: Harlon Ditty CMA (AAMA), Dottie     GERD (gastroesophageal reflux disease)    H/O seasonal allergies    Heart murmur    Hiatal hernia    Hyperlipidemia    s/p PPM implant by Dr Olevia Perches 1992 with generator change 2004 (SJM), she is device dependant   Hypertension    Mitral valve prolapse    Pacemaker     Current Outpatient Medications:    buPROPion (WELLBUTRIN XL) 300 MG 24 hr tablet, Take 1 tablet (300 mg total) by mouth daily., Disp: 30 tablet, Rfl: 0   busPIRone (BUSPAR) 10 MG tablet, Take 1 tablet (10 mg total) by mouth 3 (three) times daily., Disp: 90 tablet, Rfl: 2   cetirizine (ZYRTEC) 10 MG tablet, Take 10 mg by mouth daily as needed for allergies. , Disp: , Rfl:    Cholecalciferol (VITAMIN D3) 1000 units CAPS, Take 1 capsule by mouth daily., Disp: , Rfl:    esomeprazole (NEXIUM) 40 MG capsule, Take 1 capsule (40 mg total) by mouth daily at 12 noon., Disp: 90 capsule, Rfl: 3   estradiol (ESTRACE)  0.5 MG tablet, TAKE 1 TABLET BY MOUTH DAILY, Disp: 90 tablet, Rfl: 0   fluticasone (FLONASE) 50 MCG/ACT nasal spray, Place 1 spray into both nostrils daily as needed for allergies., Disp: 48 g, Rfl: 3   guaiFENesin (MUCINEX) 600 MG 12 hr tablet, Take 600 mg by mouth 2 (two) times daily as needed for cough. , Disp: , Rfl:    lisinopril-hydrochlorothiazide (ZESTORETIC) 20-12.5 MG tablet, Take 1 tablet by mouth every morning. (Needs to be seen before next refill), Disp: 90 tablet, Rfl: 0   LORazepam (ATIVAN) 1 MG tablet, Take 1 tablet (1 mg total) by mouth 2 (two) times daily. TAKE 1 TABLET EVERY 8 HOURS AS NEEDED FOR ANXIETY, Disp: 180 tablet, Rfl: 1   meclizine (ANTIVERT) 25 MG tablet, Take 1 tablet (25 mg  total) by mouth 3 (three) times daily as needed for dizziness., Disp: 30 tablet, Rfl: 0   medroxyPROGESTERone (PROVERA) 2.5 MG tablet, TAKE 1 TABLET BY MOUTH EVERY DAY, Disp: 90 tablet, Rfl: 3   Multiple Vitamin (MULTIVITAMIN) tablet, Take 1 tablet by mouth daily.  , Disp: , Rfl:    polyethylene glycol powder (GLYCOLAX/MIRALAX) powder, Take 1 Container by mouth as needed for mild constipation. , Disp: , Rfl:    cyclobenzaprine (FLEXERIL) 10 MG tablet, TAKE 1 TABLET THREE TIMES DAILY AS NEEDED FOR MUSCLE SPASM (Patient not taking: Reported on 12/04/2019), Disp: 30 tablet, Rfl: 0 Social History   Socioeconomic History   Marital status: Married    Spouse name: Not on file   Number of children: 1   Years of education: Not on file   Highest education level: Not on file  Occupational History   Occupation: REGISTRATION  Tobacco Use   Smoking status: Never Smoker   Smokeless tobacco: Never Used  Scientific laboratory technician Use: Never used  Substance and Sexual Activity   Alcohol use: No   Drug use: No   Sexual activity: Not on file  Other Topics Concern   Not on file  Social History Narrative   Lives with spouse in [place]   Works for an orthopoedic office in [place] Strain:    Difficulty of Paying Living Expenses:   Food Insecurity:    Worried About Charity fundraiser in the Last Year:    Arboriculturist in the Last Year:   Transportation Needs:    Film/video editor (Medical):    Lack of Transportation (Non-Medical):   Physical Activity:    Days of Exercise per Week:    Minutes of Exercise per Session:   Stress:    Feeling of Stress :   Social Connections:    Frequency of Communication with Friends and Family:    Frequency of Social Gatherings with Friends and Family:    Attends Religious Services:    Active Member of Clubs or Organizations:    Attends Music therapist:      Marital Status:   Intimate Partner Violence:    Fear of Current or Ex-Partner:    Emotionally Abused:    Physically Abused:    Sexually Abused:    Family History  Problem Relation Age of Onset   Diabetes Mother    Stroke Mother    Diabetes Father    Lung cancer Father    Lung cancer Maternal Grandfather    Heart disease Maternal Aunt    Heart disease Maternal  Grandmother    Breast cancer Maternal Aunt    Colon cancer Neg Hx    Esophageal cancer Neg Hx    Pancreatic cancer Neg Hx    Rectal cancer Neg Hx    Stomach cancer Neg Hx     Objective: Office vital signs reviewed. BP 125/62    Pulse 65    Temp 97.8 F (36.6 C) (Oral)    Ht 5\' 1"  (1.549 m)    Wt 140 lb (63.5 kg)    SpO2 97%    BMI 26.45 kg/m   Physical Examination:  General: Awake, alert, well nourished, No acute distress HEENT: Normal, sclera white, MMM Cardio: regular rate and rhythm, S1S2 heard, no murmurs appreciated Pulm: clear to auscultation bilaterally, no wheezes, rhonchi or rales; normal work of breathing on room air Extremities: warm, well perfused, No edema, cyanosis or clubbing; +2 pulses bilaterally Psych: Mood somewhat depressed.  Patient is intermittently tearful.  She is pleasant and interactive.  Affect is appropriate.  Depression screen [facility] 2/9 12/04/2019 09/02/2018 06/27/2018  Decreased Interest 0 0 1  Down, Depressed, Hopeless 1 0 0  PHQ - 2 Score 1 0 1  Altered sleeping 1 - -  Tired, decreased energy 1 - -  Change in appetite 0 - -  Feeling bad or failure about yourself  0 - -  Trouble concentrating 0 - -  Moving slowly or fidgety/restless 0 - -  Suicidal thoughts 0 - -  PHQ-9 Score 3 - -  Some recent data might be hidden   GAD 7 : Generalized Anxiety Score 12/04/2019 05/20/2019  Nervous, Anxious, on Edge 2 2  Control/stop worrying 3 2  Worry too much - different things 3 2  Trouble relaxing 3 1  Restless 1 1  Easily annoyed or irritable 1 2  Afraid - awful might  happen 2 2  Total GAD 7 Score 15 12  Anxiety Difficulty Very difficult Somewhat difficult    Assessment/ Plan: 69 y.o. female   1. Anxiety state She is under a lot of stress right now.  We discussed increasing her BuSpar to the full prescribed dose of 10 mg 3 times daily.  Continue to use the lorazepam sparingly.  The national narcotic database was reviewed and there were no red flags.  Controlled substance agreement and UDS obtained per office policy. - buPROPion (WELLBUTRIN XL) 300 MG 24 hr tablet; Take 1 tablet (300 mg total) by mouth daily.  Dispense: 90 tablet; Refill: 3 - ToxASSURE Select 13 (MW), Urine - busPIRone (BUSPAR) 10 MG tablet; Take 1 tablet (10 mg total) by mouth 3 (three) times daily.  Dispense: 90 tablet; Refill: 2 - LORazepam (ATIVAN) 1 MG tablet; Take 0.5-1 tablets (0.5-1 mg total) by mouth 2 (two) times daily as needed for anxiety.  Dispense: 180 tablet; Refill: 1  2. Controlled substance agreement signed - ToxASSURE Select 13 (MW), Urine  3. Essential hypertension Controlled.  4. Establishing care with new doctor, encounter for  5. Need for Zostavax administration - Varicella-zoster vaccine IM (Shingrix)  6. Postmenopausal Plan for prolonged taper of the estrogen.  We will continue the Provera so that she does not have unopposed estrogen given intact uterus. - estradiol (ESTRACE) 0.5 MG tablet; Take 0.5 tablets (0.25 mg total) by mouth daily for 56 days, THEN 0.5 tablets (0.25 mg total) every other day. Then stop.  Dispense: 42 tablet; Refill: 0 - medroxyPROGESTERone (PROVERA) 2.5 MG tablet; Take 1 tablet (2.5 mg total) by mouth daily.  Dispense: 90 tablet; Refill: 0    No orders of the defined types were placed in this encounter.  No orders of the defined types were placed in this encounter.    [name], DO [place] [phone]

## 2019-12-05 MED ORDER — ESTRADIOL 0.5 MG PO TABS: ORAL_TABLET | ORAL | 0 refills | Status: DC

## 2019-12-05 MED ORDER — MEDROXYPROGESTERONE ACETATE 2.5 MG PO TABS: 2.5000 mg | ORAL_TABLET | Freq: Every day | ORAL | 0 refills | Status: DC

## 2019-12-06 ENCOUNTER — Telehealth: Payer: Self-pay | Admitting: Family Medicine

## 2019-12-06 ENCOUNTER — Other Ambulatory Visit: Payer: Self-pay | Admitting: *Deleted

## 2019-12-06 DIAGNOSIS — F411 Generalized anxiety disorder: Secondary | ICD-10-CM

## 2019-12-06 MED ORDER — BUPROPION HCL ER (XL) 300 MG PO TB24: 300.0000 mg | ORAL_TABLET | Freq: Every day | ORAL | 3 refills | Status: DC

## 2019-12-06 MED ORDER — LORAZEPAM 1 MG PO TABS: 0.5000 mg | ORAL_TABLET | Freq: Two times a day (BID) | ORAL | 1 refills | Status: DC | PRN

## 2019-12-06 MED ORDER — BUSPIRONE HCL 10 MG PO TABS: 10.0000 mg | ORAL_TABLET | Freq: Three times a day (TID) | ORAL | 2 refills | Status: DC

## 2019-12-06 MED ORDER — LISINOPRIL-HYDROCHLOROTHIAZIDE 20-12.5 MG PO TABS: 1.0000 | ORAL_TABLET | Freq: Every morning | ORAL | 3 refills | Status: DC

## 2019-12-06 NOTE — Telephone Encounter (Signed)
Patient went to pick up meds and pharmacy has not received. I see where they were sent but does not show that pharmacy received. Can we resend?  I have pended the medications for you.

## 2019-12-06 NOTE — Telephone Encounter (Signed)
Outreach made to pt to discuss dates for gen change.  Pt not available.

## 2019-12-07 LAB — TOXASSURE SELECT 13 (MW), URINE

## 2019-12-11 NOTE — Telephone Encounter (Signed)
Call placed to pt.  Gen change scheduled for 01/22/2020  Will get lab 7/29   Will complete work up after visit 6/28

## 2019-12-14 ENCOUNTER — Telehealth: Payer: Self-pay

## 2019-12-14 NOTE — Telephone Encounter (Signed)
Left detailed message for patient to call and confirm Virtual Appoint scheduled for Mon 12/17/19 at 2:45 pm with Dr. Rayann Heman. Patient was instructed on message to call office and confirm which type of appt he would like and to give telehealth consent. Reiterated that it is a VIRTUAL visit and patient does not need to come into the office only to call and confirm preference of video or phone call.

## 2019-12-17 ENCOUNTER — Ambulatory Visit (INDEPENDENT_AMBULATORY_CARE_PROVIDER_SITE_OTHER): Payer: Medicare Other | Admitting: *Deleted

## 2019-12-17 ENCOUNTER — Other Ambulatory Visit: Payer: Self-pay

## 2019-12-17 ENCOUNTER — Telehealth (INDEPENDENT_AMBULATORY_CARE_PROVIDER_SITE_OTHER): Payer: Medicare Other | Admitting: Internal Medicine

## 2019-12-17 ENCOUNTER — Encounter: Payer: Self-pay | Admitting: Internal Medicine

## 2019-12-17 VITALS — BP 143/61 | HR 41 | Ht 61.0 in | Wt 140.0 lb

## 2019-12-17 DIAGNOSIS — I442 Atrioventricular block, complete: Secondary | ICD-10-CM

## 2019-12-17 DIAGNOSIS — I10 Essential (primary) hypertension: Secondary | ICD-10-CM | POA: Diagnosis not present

## 2019-12-17 NOTE — Telephone Encounter (Signed)
Spoke with Pt.  Pt scheduled for gen change on 01/22/2020 at 1:30 pm with Dr. Rayann Heman.  Will come to office on January 17, 2020 for labs/instruction letter/soap.  Work up complete.  Pt has virtual visit 6/28 to discuss with Dr. Rayann Heman.

## 2019-12-17 NOTE — Progress Notes (Signed)
Electrophysiology TeleHealth Note  Due to national recommendations of social distancing due to [place] 19, an audio telehealth visit is felt to be most appropriate for this patient at this time.  Verbal consent was obtained by me for the telehealth visit today.  The patient does not have capability for a virtual visit.  A phone visit is therefore required today.   Date:  12/17/2019   ID:  Carmen Green, DOB 1950-09-21, MRN [id number]  Location: patient's home  Provider location:  [place]  Evaluation Performed: Follow-up visit  PCP:  [name], DO   Electrophysiologist:  Dr Rayann Heman  Chief Complaint:  palpitations  History of Present Illness:    Carmen Green is a 70 y.o. female who presents via telehealth conferencing today.  Since last being seen in our clinic, the patient reports doing very well.  Today, she denies symptoms of palpitations, chest pain, shortness of breath,  lower extremity edema, dizziness, presyncope, or syncope.  The patient is otherwise without complaint today.     Past Medical History:  Diagnosis Date  . Allergy   . Anemia   . Anxiety   . Arthritis   . Cataract    bil cataracts removed  . Colon polyp   . Complete heart block ([place])   . Contusion of chest wall 10/10/2007   Centricity Description: CHEST WALL CONTUSION Qualifier: Diagnosis of  By: Aline Brochure MD, Dorothyann Peng   Centricity Description: CONTUSION, RIGHT RIB Qualifier: Diagnosis of  By: Aline Brochure MD, Dorothyann Peng    . Depression   . Diverticulitis   . Esophagitis   . ESOPHAGITIS 06/25/2008   Qualifier: History of  By: Nelson-Smith CMA (AAMA), Dottie    . GERD (gastroesophageal reflux disease)   . H/O seasonal allergies   . Heart murmur   . Hiatal hernia   . 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 Hypertension   . Mitral valve prolapse   . Pacemaker     Past Surgical History:  Procedure Laterality Date  .  APPENDECTOMY    . BREAST EXCISIONAL BIOPSY Right   . BREAST SURGERY Right    biopsy  . COLONOSCOPY  10/19/2004   [facility]C  . EYE SURGERY Left    cataract extraction with IOL  . LAPAROSCOPIC LOW ANTERIOR RESECTION  11/16/2013   Robotic  . LAPAROSCOPIC LYSIS OF ADHESIONS N/A 11/15/2013   Procedure: LAPAROSCOPIC LYSIS OF ADHESIONS;  Surgeon: Adin Hector, MD;  Location: [facility];  Service: General;  Laterality: N/A;  . left ovary cyst removal  1972  . [place], 2004, 2012   device dependant  . POLYPECTOMY    . PROCTOSCOPY  11/15/2013   Procedure: RIGID PROCTOSCOPY;  Surgeon: Adin Hector, MD;  Location: [facility];  Service: General;;  . ROBOTIC ASSISTED SALPINGO OOPHERECTOMY Left 11/16/2013   Robotic en bloc w LAR resection  . TUBAL LIGATION    . UPPER GASTROINTESTINAL ENDOSCOPY      Current Outpatient Medications  Medication Sig Dispense Refill  . buPROPion (WELLBUTRIN XL) 300 MG 24 hr tablet Take 1 tablet (300 mg total) by mouth daily. 90 tablet 3  . busPIRone (BUSPAR) 10 MG tablet Take 1 tablet (10 mg total) by mouth 3 (three) times daily. 90 tablet 2  . cetirizine (ZYRTEC) 10 MG tablet Take 10 mg by mouth daily as needed for allergies.     Marland Kitchen  Cholecalciferol (VITAMIN D3) 1000 units CAPS Take 1 capsule by mouth daily.    Marland Kitchen esomeprazole (NEXIUM) 40 MG capsule Take 1 capsule (40 mg total) by mouth daily at 12 noon. 90 capsule 3  . estradiol (ESTRACE) 0.5 MG tablet Take 0.5 tablets (0.25 mg total) by mouth daily for 56 days, THEN 0.5 tablets (0.25 mg total) every other day. Then stop. 42 tablet 0  . fluticasone (FLONASE) 50 MCG/ACT nasal spray Place 1 spray into both nostrils daily as needed for allergies. 48 g 3  . guaiFENesin (MUCINEX) 600 MG 12 hr tablet Take 600 mg by mouth 2 (two) times daily as needed for cough.     Marland Kitchen lisinopril-hydrochlorothiazide (ZESTORETIC) 20-12.5 MG tablet Take 1 tablet by mouth every morning. 90 tablet 3  . LORazepam (ATIVAN) 1 MG tablet Take 0.5-1  tablets (0.5-1 mg total) by mouth 2 (two) times daily as needed for anxiety. 180 tablet 1  . meclizine (ANTIVERT) 25 MG tablet Take 1 tablet (25 mg total) by mouth 3 (three) times daily as needed for dizziness. 30 tablet 0  . medroxyPROGESTERone (PROVERA) 2.5 MG tablet Take 1 tablet (2.5 mg total) by mouth daily. 90 tablet 0  . Multiple Vitamin (MULTIVITAMIN) tablet Take 1 tablet by mouth daily.      . polyethylene glycol powder (GLYCOLAX/MIRALAX) powder Take 1 Container by mouth as needed for mild constipation.      No current facility-administered medications for this visit.    Allergies:   Amoxapine and related, Zoloft [sertraline hcl], Cephalexin, Oxycodone, and Penicillins   Social History:  The patient  reports that she has never smoked. She has never used smokeless tobacco. She reports that she does not drink alcohol and does not use drugs.   ROS:  Please see the history of present illness.   All other systems are personally reviewed and negative.    Exam:    Vital Signs:  BP (!) 143/61   Pulse (!) 41   Ht 5\' 1"  (1.549 m)   Wt 140 lb (63.5 kg)   BMI 26.45 kg/m   Well sounding, alert and conversant   Labs/Other Tests and Data Reviewed:    Recent Labs: 06/12/2019: ALT 11; BUN 9; Creatinine, Ser 0.81; Hemoglobin 12.9; Platelets 199; Potassium 3.9; Sodium 135; TSH 1.250   Wt Readings from Last 3 Encounters:  12/17/19 140 lb (63.5 kg)  12/04/19 140 lb (63.5 kg)  08/06/19 142 lb 6.4 oz (64.6 kg)     Last device remote is reviewed from [employer] PDF which reveals normal device function, she has reached ERI.   ASSESSMENT & PLAN:    1.  Complete heart block She has done well chronically.  She is dependant.  Her atrial lead threshold is chronically elevated though she has done well with VDD pacing.  Her leads are from 59.   Risks, benefits, and alternatives to pacemaker pulse generator replacement with possible lead revision were discussed in detail today.  I have spoken  with [facility] representative Windle Guard who feels that we could potentially use her current leads for another 10 years.  As her old system is on the R side, it would also be reasonable to place a new system on the left side.  She is very functional and active.   The patient understands that risks include but are not limited to bleeding, infection, pneumothorax, perforation, tamponade, vascular damage, renal failure, MI, stroke, death, damage to his existing leads, and lead dislodgement and wishes to proceed.  We will therefore schedule the procedure at the next available time. I would probably favor placing new leads at that time.  2. HTN Stable No change required today   Risks, benefits and potential toxicities for medications prescribed and/or refilled reviewed with patient today.    Patient Risk:  after full review of this patients clinical status, I feel that they are at moderate risk at this time.  Today, I have spent 15 minutes with the patient with telehealth technology discussing arrhythmia management .    SignedThompson Grayer, MD  12/17/2019 2:56 PM     [address] [place] [address] [phone] (office) [phone] (fax)

## 2019-12-17 NOTE — Addendum Note (Signed)
Addended by: Willeen Cass A on: 12/17/2019 10:02 AM   Modules accepted: Orders

## 2019-12-18 LAB — CUP PACEART REMOTE DEVICE CHECK
Battery Remaining Longevity: 0 mo
Battery Voltage: 2.6 V
Brady Statistic AS VP Percent: 90 %
Brady Statistic AS VS Percent: 5 %
Brady Statistic RV Percent Paced: 91 %
Date Time Interrogation Session: 20210629000100
Implantable Lead Implant Date: 19920319
Implantable Lead Implant Date: 19920319
Implantable Lead Location: 753859
Implantable Lead Location: 753860
Implantable Pulse Generator Implant Date: 20120803
Lead Channel Impedance Value: 440 Ohm
Lead Channel Impedance Value: 540 Ohm
Lead Channel Pacing Threshold Amplitude: 1.5 V
Lead Channel Pacing Threshold Pulse Width: 0.5 ms
Lead Channel Sensing Intrinsic Amplitude: 2.1 mV
Lead Channel Sensing Intrinsic Amplitude: 9.4 mV
Lead Channel Setting Pacing Amplitude: 1.75 V
Lead Channel Setting Pacing Pulse Width: 0.5 ms
Lead Channel Setting Sensing Sensitivity: 4 mV
Pulse Gen Model: 2210
Pulse Gen Serial Number: 7252855

## 2019-12-19 NOTE — Addendum Note (Signed)
Addended by: Cheri Kearns A on: 12/19/2019 08:50 AM   Modules accepted: Level of Service

## 2019-12-19 NOTE — Progress Notes (Signed)
Remote pacemaker transmission.   

## 2020-01-03 ENCOUNTER — Other Ambulatory Visit: Payer: Self-pay | Admitting: Gastroenterology

## 2020-01-04 ENCOUNTER — Ambulatory Visit (INDEPENDENT_AMBULATORY_CARE_PROVIDER_SITE_OTHER): Payer: Medicare Other | Admitting: Family Medicine

## 2020-01-04 DIAGNOSIS — F439 Reaction to severe stress, unspecified: Secondary | ICD-10-CM

## 2020-01-04 DIAGNOSIS — F411 Generalized anxiety disorder: Secondary | ICD-10-CM

## 2020-01-04 NOTE — Progress Notes (Signed)
Telephone visit  Subjective: CC: f/u anxiety PCP: [name], DO JFH:[id number] Carmen Green is a 69 y.o. female calls for telephone consult today. Patient provides verbal consent for consult held via phone.  Due to COVID-19 pandemic this visit was conducted virtually. This visit type was conducted due to national recommendations for restrictions regarding the COVID-19 Pandemic (e.g. social distancing, sheltering in place) in an effort to limit this patient's exposure and mitigate transmission in our community. All issues noted in this document were discussed and addressed.  A physical exam was not performed with this format.   Location of patient: home Location of provider: [facility] Others present for call: none  1. GAD Patient reports not being as anxious as she was prior to increasing the Buspar.  She had a few bad days but she is having more good days than bad ones.  She is using the Ativan less than before.  She has been overall feeling better.  She does not wish to adjust medications today.  ROS: Per HPI  Allergies  Allergen Reactions  . Amoxapine And Related     Rash  . Zoloft [Sertraline Hcl]     Nausea   . Cephalexin Rash  . Oxycodone     "Loopy"  . Penicillins Rash   Past Medical History:  Diagnosis Date  . Allergy   . Anemia   . Anxiety   . Arthritis   . Cataract    bil cataracts removed  . Colon polyp   . Complete heart block ([place])   . Contusion of chest wall 10/10/2007   Centricity Description: CHEST WALL CONTUSION Qualifier: Diagnosis of  By: Aline Brochure MD, Dorothyann Peng   Centricity Description: CONTUSION, RIGHT RIB Qualifier: Diagnosis of  By: Aline Brochure MD, Dorothyann Peng    . Depression   . Diverticulitis   . Esophagitis   . ESOPHAGITIS 06/25/2008   Qualifier: History of  By: Nelson-Smith CMA (AAMA), Dottie    . GERD (gastroesophageal reflux disease)   . H/O seasonal allergies   . Heart murmur   . Hiatal hernia   . Hyperlipidemia    s/p PPM implant by Dr Olevia Perches 1992  with generator change 2004 ([facility]), she is device dependant  . Hypertension   . Mitral valve prolapse   . Pacemaker     Current Outpatient Medications:  .  buPROPion (WELLBUTRIN XL) 300 MG 24 hr tablet, Take 1 tablet (300 mg total) by mouth daily., Disp: 90 tablet, Rfl: 3 .  busPIRone (BUSPAR) 10 MG tablet, Take 1 tablet (10 mg total) by mouth 3 (three) times daily., Disp: 90 tablet, Rfl: 2 .  cetirizine (ZYRTEC) 10 MG tablet, Take 10 mg by mouth daily as needed for allergies. , Disp: , Rfl:  .  Cholecalciferol (VITAMIN D3) 1000 units CAPS, Take 1 capsule by mouth daily., Disp: , Rfl:  .  esomeprazole (NEXIUM) 40 MG capsule, TAKE 1 CAPSULE ONCE DAILY AT NOON, Disp: 90 capsule, Rfl: 0 .  estradiol (ESTRACE) 0.5 MG tablet, Take 0.5 tablets (0.25 mg total) by mouth daily for 56 days, THEN 0.5 tablets (0.25 mg total) every other day. Then stop., Disp: 42 tablet, Rfl: 0 .  fluticasone (FLONASE) 50 MCG/ACT nasal spray, Place 1 spray into both nostrils daily as needed for allergies., Disp: 48 g, Rfl: 3 .  guaiFENesin (MUCINEX) 600 MG 12 hr tablet, Take 600 mg by mouth 2 (two) times daily as needed for cough. , Disp: , Rfl:  .  lisinopril-hydrochlorothiazide (ZESTORETIC) 20-12.5 MG tablet, Take  1 tablet by mouth every morning., Disp: 90 tablet, Rfl: 3 .  LORazepam (ATIVAN) 1 MG tablet, Take 0.5-1 tablets (0.5-1 mg total) by mouth 2 (two) times daily as needed for anxiety., Disp: 180 tablet, Rfl: 1 .  meclizine (ANTIVERT) 25 MG tablet, Take 1 tablet (25 mg total) by mouth 3 (three) times daily as needed for dizziness., Disp: 30 tablet, Rfl: 0 .  medroxyPROGESTERone (PROVERA) 2.5 MG tablet, Take 1 tablet (2.5 mg total) by mouth daily., Disp: 90 tablet, Rfl: 0 .  Multiple Vitamin (MULTIVITAMIN) tablet, Take 1 tablet by mouth daily.  , Disp: , Rfl:  .  polyethylene glycol powder (GLYCOLAX/MIRALAX) powder, Take 1 Container by mouth as needed for mild constipation. , Disp: , Rfl:   Assessment/ Plan: 69 y.o.  female   1. Anxiety state Stable. Continue current regimen.  Follow up in 3-4 months  2. Stress at home   Start time: 11:39am End time: 11:43am  Total time spent on patient care (including telephone call/ virtual visit): 7 minutes  [place], [place] [phone]

## 2020-01-16 ENCOUNTER — Telehealth: Payer: Self-pay | Admitting: Internal Medicine

## 2020-01-16 NOTE — Telephone Encounter (Signed)
Patient coming 01/17/2020 for labs.  Reminded her that Sonia Baller with meet with her tomorrow after labs to go over instructions and give [facility] surgical scrub.

## 2020-01-16 NOTE — Telephone Encounter (Signed)
Patient is wanting to know when is a good time to come pick up the solution from the office, that she needs to shower with prior to procedure on 8/3, she is also wanting to know when is a good time to come for labs, so she doesn't want to have to wait, due to a 40-45 min drive. She is wanting to come for labs and pick up the solution in one trip. Please call.

## 2020-01-17 ENCOUNTER — Telehealth: Payer: Self-pay | Admitting: Emergency Medicine

## 2020-01-17 ENCOUNTER — Other Ambulatory Visit: Payer: Self-pay

## 2020-01-17 ENCOUNTER — Other Ambulatory Visit: Payer: Medicare Other | Admitting: *Deleted

## 2020-01-17 ENCOUNTER — Ambulatory Visit (INDEPENDENT_AMBULATORY_CARE_PROVIDER_SITE_OTHER): Payer: Medicare Other | Admitting: *Deleted

## 2020-01-17 DIAGNOSIS — I442 Atrioventricular block, complete: Secondary | ICD-10-CM | POA: Diagnosis not present

## 2020-01-17 LAB — CUP PACEART REMOTE DEVICE CHECK
Battery Remaining Longevity: 0 mo
Battery Voltage: 2.57 V
Brady Statistic AS VP Percent: 90 %
Brady Statistic AS VS Percent: 5.1 %
Brady Statistic RV Percent Paced: 91 %
Date Time Interrogation Session: 20210729021137
Implantable Lead Implant Date: 19920319
Implantable Lead Implant Date: 19920319
Implantable Lead Location: 753859
Implantable Lead Location: 753860
Implantable Pulse Generator Implant Date: 20120803
Lead Channel Impedance Value: 410 Ohm
Lead Channel Impedance Value: 550 Ohm
Lead Channel Pacing Threshold Amplitude: 1.5 V
Lead Channel Pacing Threshold Pulse Width: 0.5 ms
Lead Channel Sensing Intrinsic Amplitude: 2 mV
Lead Channel Sensing Intrinsic Amplitude: 8.7 mV
Lead Channel Setting Pacing Amplitude: 1.75 V
Lead Channel Setting Pacing Pulse Width: 0.5 ms
Lead Channel Setting Sensing Sensitivity: 4 mV
Pulse Gen Model: 2210
Pulse Gen Serial Number: 7252855

## 2020-01-17 NOTE — Telephone Encounter (Signed)
Patient aware of gen change date.

## 2020-01-17 NOTE — Telephone Encounter (Signed)
The pt was returning [place] call. I told her I will have the nurse to call her back.

## 2020-01-17 NOTE — Telephone Encounter (Addendum)
LM for patinet to call device clinic. Phone # and office hours provided. Notify patient that device at [facility]. Dr Rayann Heman has already discussed replacement and placed on schedule for gen change 01/22/20.

## 2020-01-18 ENCOUNTER — Telehealth: Payer: Self-pay | Admitting: Internal Medicine

## 2020-01-18 LAB — CBC WITH DIFFERENTIAL/PLATELET
Basophils Absolute: 0.1 10*3/uL (ref 0.0–0.2)
Basos: 1 %
EOS (ABSOLUTE): 0 10*3/uL (ref 0.0–0.4)
Eos: 1 %
Hematocrit: 38.9 % (ref 34.0–46.6)
Hemoglobin: 13.6 g/dL (ref 11.1–15.9)
Immature Grans (Abs): 0 10*3/uL (ref 0.0–0.1)
Immature Granulocytes: 0 %
Lymphocytes Absolute: 1.1 10*3/uL (ref 0.7–3.1)
Lymphs: 14 %
MCH: 32.9 pg (ref 26.6–33.0)
MCHC: 35 g/dL (ref 31.5–35.7)
MCV: 94 fL (ref 79–97)
Monocytes Absolute: 0.6 10*3/uL (ref 0.1–0.9)
Monocytes: 7 %
Neutrophils Absolute: 6.1 10*3/uL (ref 1.4–7.0)
Neutrophils: 77 %
Platelets: 200 10*3/uL (ref 150–450)
RBC: 4.14 x10E6/uL (ref 3.77–5.28)
RDW: 11.8 % (ref 11.7–15.4)
WBC: 7.9 10*3/uL (ref 3.4–10.8)

## 2020-01-18 LAB — BASIC METABOLIC PANEL
BUN/Creatinine Ratio: 15 (ref 12–28)
BUN: 18 mg/dL (ref 8–27)
CO2: 22 mmol/L (ref 20–29)
Calcium: 9.7 mg/dL (ref 8.7–10.3)
Chloride: 97 mmol/L (ref 96–106)
Creatinine, Ser: 1.24 mg/dL — ABNORMAL HIGH (ref 0.57–1.00)
GFR calc Af Amer: 50 mL/min/{1.73_m2} — ABNORMAL LOW (ref >59)
GFR calc non Af Amer: 44 mL/min/{1.73_m2} — ABNORMAL LOW (ref >59)
Glucose: 105 mg/dL — ABNORMAL HIGH (ref 65–99)
Potassium: 4 mmol/L (ref 3.5–5.2)
Sodium: 135 mmol/L (ref 134–144)

## 2020-01-18 NOTE — Telephone Encounter (Addendum)
Pt is calling back after speaking w/ Sonia Baller yesterday on advisement on if she can have a 2nd visitor on the day of her procedure.  She is calling to inform us that she spoke with someone at the hospital yesterday (as advised by Bing Neighbors) and was informed that "if Dr. Rayann Heman says it is ok then they will allow my second visitor".   She informs me that Dr. Rayann Heman knows her husband and his limitations, and he really wants to be in waiting room but don't want to make their driver wait in the hot car for hours during procedure.  She states that if he approves it then someone will need to call Anette Riedel (head of volunteer services) to let her know if approved.  Her number is [phone].  Aware that I will forward this to his covering RN, Otila Kluver, to address Monday when they are back in the office. (she wanted answer today but explained that [place] nor his nurse are the office today to address). She was agreeable to waiting until Monday and would like a call from the RN to let her know if this plan is approved.

## 2020-01-18 NOTE — Telephone Encounter (Signed)
Patient is calling to speak with dr. Jackalyn Lombard nurse. Please call back

## 2020-01-21 NOTE — Progress Notes (Signed)
Remote pacemaker transmission.   

## 2020-01-21 NOTE — Telephone Encounter (Signed)
Left message for Anette Riedel with volunteer services that it is ok for Pt to have 2 visitors in lobby while Pt has her gen change.  Notified Pt that this nurse had done that.  Advised Pt to call this nurse at [facility] if any issues tomorrow.

## 2020-01-22 ENCOUNTER — Other Ambulatory Visit: Payer: Self-pay

## 2020-01-22 ENCOUNTER — Encounter (HOSPITAL_COMMUNITY)
Admission: RE | Disposition: A | Discharge status: Home / Self Care | Payer: Medicare Other | Source: Home / Self Care | Attending: Internal Medicine

## 2020-01-22 ENCOUNTER — Ambulatory Visit (HOSPITAL_COMMUNITY)
Admission: RE | Admit: 2020-01-22 | Discharge: 2020-01-22 | Disposition: A | Discharge status: Home / Self Care | Payer: Medicare Other | Attending: Internal Medicine | Admitting: Internal Medicine

## 2020-01-22 ENCOUNTER — Ambulatory Visit (HOSPITAL_COMMUNITY): Payer: Medicare Other

## 2020-01-22 DIAGNOSIS — I341 Nonrheumatic mitral (valve) prolapse: Secondary | ICD-10-CM | POA: Insufficient documentation

## 2020-01-22 DIAGNOSIS — Z885 Allergy status to narcotic agent status: Secondary | ICD-10-CM | POA: Diagnosis not present

## 2020-01-22 DIAGNOSIS — Z880 Allergy status to penicillin: Secondary | ICD-10-CM | POA: Insufficient documentation

## 2020-01-22 DIAGNOSIS — I10 Essential (primary) hypertension: Secondary | ICD-10-CM | POA: Diagnosis not present

## 2020-01-22 DIAGNOSIS — Z45010 Encounter for checking and testing of cardiac pacemaker pulse generator [battery]: Secondary | ICD-10-CM | POA: Diagnosis not present

## 2020-01-22 DIAGNOSIS — Z881 Allergy status to other antibiotic agents status: Secondary | ICD-10-CM | POA: Diagnosis not present

## 2020-01-22 DIAGNOSIS — E785 Hyperlipidemia, unspecified: Secondary | ICD-10-CM | POA: Diagnosis not present

## 2020-01-22 DIAGNOSIS — F329 Major depressive disorder, single episode, unspecified: Secondary | ICD-10-CM | POA: Insufficient documentation

## 2020-01-22 DIAGNOSIS — I442 Atrioventricular block, complete: Secondary | ICD-10-CM | POA: Insufficient documentation

## 2020-01-22 DIAGNOSIS — Z79899 Other long term (current) drug therapy: Secondary | ICD-10-CM | POA: Diagnosis not present

## 2020-01-22 DIAGNOSIS — Z950 Presence of cardiac pacemaker: Secondary | ICD-10-CM

## 2020-01-22 DIAGNOSIS — K219 Gastro-esophageal reflux disease without esophagitis: Secondary | ICD-10-CM | POA: Insufficient documentation

## 2020-01-22 DIAGNOSIS — F419 Anxiety disorder, unspecified: Secondary | ICD-10-CM | POA: Diagnosis not present

## 2020-01-22 DIAGNOSIS — R21 Rash and other nonspecific skin eruption: Secondary | ICD-10-CM | POA: Diagnosis not present

## 2020-01-22 HISTORY — PX: PACEMAKER IMPLANT: EP1218

## 2020-01-22 SURGERY — PACEMAKER IMPLANT

## 2020-01-22 MED ORDER — LIDOCAINE HCL (PF) 1 % IJ SOLN
INTRAMUSCULAR | Status: AC
  Filled 2020-01-22: qty 30

## 2020-01-22 MED ORDER — MIDAZOLAM HCL 5 MG/5ML IJ SOLN
INTRAMUSCULAR | Status: AC
  Filled 2020-01-22: qty 5

## 2020-01-22 MED ORDER — SODIUM CHLORIDE 0.9% FLUSH: 3.0000 mL | Freq: Two times a day (BID) | INTRAVENOUS | Status: DC

## 2020-01-22 MED ORDER — IOHEXOL 350 MG/ML SOLN
INTRAVENOUS | Status: DC | PRN
  Administered 2020-01-22: 15 mL

## 2020-01-22 MED ORDER — SODIUM CHLORIDE 0.9 % IV SOLN
INTRAVENOUS | Status: DC | PRN
  Administered 2020-01-22: 500 mL

## 2020-01-22 MED ORDER — FENTANYL CITRATE (PF) 100 MCG/2ML IJ SOLN
INTRAMUSCULAR | Status: AC
  Filled 2020-01-22: qty 2

## 2020-01-22 MED ORDER — ONDANSETRON HCL 4 MG/2ML IJ SOLN: 4.0000 mg | Freq: Four times a day (QID) | INTRAMUSCULAR | Status: DC | PRN

## 2020-01-22 MED ORDER — SODIUM CHLORIDE 0.9% FLUSH: 3.0000 mL | INTRAVENOUS | Status: DC | PRN

## 2020-01-22 MED ORDER — VANCOMYCIN HCL IN DEXTROSE 1-5 GM/200ML-% IV SOLN
1000.0000 mg | INTRAVENOUS | Status: AC
  Administered 2020-01-22: 1000 mg via INTRAVENOUS
  Filled 2020-01-22: qty 200

## 2020-01-22 MED ORDER — FENTANYL CITRATE (PF) 100 MCG/2ML IJ SOLN
INTRAMUSCULAR | Status: DC | PRN
  Administered 2020-01-22: 12.5 ug via INTRAVENOUS
  Administered 2020-01-22: 25 ug via INTRAVENOUS

## 2020-01-22 MED ORDER — CHLORHEXIDINE GLUCONATE 4 % EX LIQD
4.0000 "application " | Freq: Once | CUTANEOUS | Status: DC
  Filled 2020-01-22: qty 60

## 2020-01-22 MED ORDER — ACETAMINOPHEN 325 MG PO TABS
325.0000 mg | ORAL_TABLET | ORAL | Status: DC | PRN
  Administered 2020-01-22: 650 mg via ORAL
  Filled 2020-01-22 (×3): qty 2

## 2020-01-22 MED ORDER — SODIUM CHLORIDE 0.9 % IV SOLN: 250.0000 mL | INTRAVENOUS | Status: DC | PRN

## 2020-01-22 MED ORDER — MIDAZOLAM HCL 5 MG/5ML IJ SOLN
INTRAMUSCULAR | Status: DC | PRN
  Administered 2020-01-22: 1 mg via INTRAVENOUS
  Administered 2020-01-22: 2 mg via INTRAVENOUS
  Administered 2020-01-22 (×2): 1 mg via INTRAVENOUS

## 2020-01-22 MED ORDER — LIDOCAINE HCL (PF) 1 % IJ SOLN
INTRAMUSCULAR | Status: DC | PRN
  Administered 2020-01-22: 30 mL
  Administered 2020-01-22: 60 mL

## 2020-01-22 MED ORDER — SODIUM CHLORIDE 0.9 % IV SOLN: 250.0000 mL | INTRAVENOUS | Status: DC

## 2020-01-22 MED ORDER — SODIUM CHLORIDE 0.9 % IV SOLN
80.0000 mg | INTRAVENOUS | Status: AC
  Administered 2020-01-22: 80 mg
  Filled 2020-01-22: qty 2

## 2020-01-22 MED ORDER — SODIUM CHLORIDE 0.9 % IV SOLN: INTRAVENOUS | Status: DC

## 2020-01-22 MED ORDER — SODIUM CHLORIDE 0.9 % IV SOLN
INTRAVENOUS | Status: AC
  Filled 2020-01-22 (×2): qty 2

## 2020-01-22 MED ORDER — HEPARIN (PORCINE) IN NACL 1000-0.9 UT/500ML-% IV SOLN
INTRAVENOUS | Status: DC | PRN
  Administered 2020-01-22: 500 mL

## 2020-01-22 MED ORDER — LIDOCAINE HCL (PF) 1 % IJ SOLN
INTRAMUSCULAR | Status: AC
  Filled 2020-01-22: qty 60

## 2020-01-22 MED ORDER — HEPARIN (PORCINE) IN NACL 1000-0.9 UT/500ML-% IV SOLN
INTRAVENOUS | Status: AC
  Filled 2020-01-22: qty 500

## 2020-01-22 SURGICAL SUPPLY — 8 items
CABLE SURGICAL S-101-97-12 (CABLE) ×2 IMPLANT
KIT MICROPUNCTURE NIT STIFF (SHEATH) ×2 IMPLANT
LEAD TENDRIL MRI 46CM LPA1200M (Lead) ×2 IMPLANT
LEAD TENDRIL MRI 58CM LPA1200M (Lead) ×2 IMPLANT
PACEMAKER ASSURITY DR-RF (Pacemaker) ×2 IMPLANT
PAD PRO RADIOLUCENT 2001M-C (PAD) ×2 IMPLANT
SHEATH 8FR PRELUDE SNAP 13 (SHEATH) ×4 IMPLANT
TRAY PACEMAKER INSERTION (PACKS) ×2 IMPLANT

## 2020-01-22 NOTE — Progress Notes (Signed)
Per Dr Rayann Heman, pt may be discharged at 2000, Dr saw rhythm and rate prior to dc.

## 2020-01-22 NOTE — H&P (Signed)
Chief Complaint:  palpitations  History of Present Illness:    Carmen Green is a 69 y.o. female who presents for pacemaker system revision.  Since last being seen in our clinic, the patient reports doing very well.  Today, she denies symptoms of palpitations, chest pain, shortness of breath,  lower extremity edema, dizziness, presyncope, or syncope.  The patient is otherwise without complaint today.         Past Medical History:  Diagnosis Date  . Allergy   . Anemia   . Anxiety   . Arthritis   . Cataract    bil cataracts removed  . Colon polyp   . Complete heart block ([place])   . Contusion of chest wall 10/10/2007   Centricity Description: CHEST WALL CONTUSION Qualifier: Diagnosis of  By: Aline Brochure MD, Dorothyann Peng   Centricity Description: CONTUSION, RIGHT RIB Qualifier: Diagnosis of  By: Aline Brochure MD, Dorothyann Peng    . Depression   . Diverticulitis   . Esophagitis   . ESOPHAGITIS 06/25/2008   Qualifier: History of  By: Nelson-Smith CMA (AAMA), Dottie    . GERD (gastroesophageal reflux disease)   . H/O seasonal allergies   . Heart murmur   . Hiatal hernia   . 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 Hypertension   . Mitral valve prolapse   . Pacemaker          Past Surgical History:  Procedure Laterality Date  . APPENDECTOMY    . BREAST EXCISIONAL BIOPSY Right   . BREAST SURGERY Right    biopsy  . COLONOSCOPY  10/19/2004   LEC  . EYE SURGERY Left    cataract extraction with IOL  . LAPAROSCOPIC LOW ANTERIOR RESECTION  11/16/2013   Robotic  . LAPAROSCOPIC LYSIS OF ADHESIONS N/A 11/15/2013   Procedure: LAPAROSCOPIC LYSIS OF ADHESIONS;  Surgeon: Adin Hector, MD;  Location: [facility];  Service: General;  Laterality: N/A;  . left ovary cyst removal  1972  . [place], 2004, 2012   device dependant  . POLYPECTOMY    . PROCTOSCOPY  11/15/2013    Procedure: RIGID PROCTOSCOPY;  Surgeon: Adin Hector, MD;  Location: [facility];  Service: General;;  . ROBOTIC ASSISTED SALPINGO OOPHERECTOMY Left 11/16/2013   Robotic en bloc w LAR resection  . TUBAL LIGATION    . UPPER GASTROINTESTINAL ENDOSCOPY            Current Outpatient Medications  Medication Sig Dispense Refill  . buPROPion (WELLBUTRIN XL) 300 MG 24 hr tablet Take 1 tablet (300 mg total) by mouth daily. 90 tablet 3  . busPIRone (BUSPAR) 10 MG tablet Take 1 tablet (10 mg total) by mouth 3 (three) times daily. 90 tablet 2  . cetirizine (ZYRTEC) 10 MG tablet Take 10 mg by mouth daily as needed for allergies.     . Cholecalciferol (VITAMIN D3) 1000 units CAPS Take 1 capsule by mouth daily.    Marland Kitchen esomeprazole (NEXIUM) 40 MG capsule Take 1 capsule (40 mg total) by mouth daily at 12 noon. 90 capsule 3  . estradiol (ESTRACE) 0.5 MG tablet Take 0.5 tablets (0.25 mg total) by mouth daily for 56 days, THEN 0.5 tablets (0.25 mg total) every other day. Then stop. 42 tablet 0  . fluticasone (FLONASE) 50 MCG/ACT nasal spray Place 1 spray into both nostrils daily as needed for allergies. 48 g 3  . guaiFENesin ([place])  600 MG 12 hr tablet Take 600 mg by mouth 2 (two) times daily as needed for cough.     Marland Kitchen lisinopril-hydrochlorothiazide (ZESTORETIC) 20-12.5 MG tablet Take 1 tablet by mouth every morning. 90 tablet 3  . LORazepam (ATIVAN) 1 MG tablet Take 0.5-1 tablets (0.5-1 mg total) by mouth 2 (two) times daily as needed for anxiety. 180 tablet 1  . meclizine (ANTIVERT) 25 MG tablet Take 1 tablet (25 mg total) by mouth 3 (three) times daily as needed for dizziness. 30 tablet 0  . medroxyPROGESTERone (PROVERA) 2.5 MG tablet Take 1 tablet (2.5 mg total) by mouth daily. 90 tablet 0  . Multiple Vitamin (MULTIVITAMIN) tablet Take 1 tablet by mouth daily.      . polyethylene glycol powder (GLYCOLAX/MIRALAX) powder Take 1 Container by mouth as needed for mild constipation.      No current  facility-administered medications for this visit.    Allergies:   Amoxapine and related, Zoloft [sertraline hcl], Cephalexin, Oxycodone, and Penicillins   Social History:  The patient  reports that she has never smoked. She has never used smokeless tobacco. She reports that she does not drink alcohol and does not use drugs.   ROS:  Please see the history of present illness.   All other systems are personally reviewed and negative.   Physical Exam: Vitals:   01/22/20 1147 01/22/20 1156  BP:  (!) 159/79  Resp: 16 16  TempSrc: Oral   SpO2:  98%  Weight: 63.5 kg   Height: 5\' 1"  (1.549 m)     GEN- The patient is well appearing, alert and oriented x 3 today.   Head- normocephalic, atraumatic Eyes-  Sclera clear, conjunctiva pink Ears- hearing intact Oropharynx- clear Neck- supple, Lungs-  normal work of breathing Heart- Regular rate and rhythm  GI- soft  Extremities- no clubbing, cyanosis, or edema, groin is without hematoma    Labs/Other Tests and Data Reviewed:    Recent Labs: 06/12/2019: ALT 11; BUN 9; Creatinine, Ser 0.81; Hemoglobin 12.9; Platelets 199; Potassium 3.9; Sodium 135; TSH 1.250      Wt Readings from Last 3 Encounters:  12/17/19 140 lb (63.5 kg)  12/04/19 140 lb (63.5 kg)  08/06/19 142 lb 6.4 oz (64.6 kg)     Last device remote is reviewed from [employer] PDF which reveals normal device function, she has reached ERI.   ASSESSMENT & PLAN:    1.  Complete heart block She has done well chronically.  She is dependant.  Her atrial lead threshold is chronically elevated though she has done well with VDD pacing.  Her leads are from 16.   I have spoken with [employer] team at length (especially Windle Guard).  Given signs of lead failure on her atrial lead and the fact that she is device dependant, I would favor abandoning her R sided pacing system and placing a new left sided pacemaker. Risks, benefits, alternatives to new L sided pacemaker implantation  with removal of the R sided generator and capping of the leads were discussed in detail with the patient today. The patient understands that the risks include but are not limited to bleeding, infection, pneumothorax, perforation, tamponade, vascular damage, renal failure, MI, stroke, death,  and lead dislodgement and wishes to proceed.   Thompson Grayer MD, [place] 01/22/2020 12:33 PM

## 2020-01-22 NOTE — Discharge Instructions (Signed)
° ° °  Supplemental Discharge Instructions for  Pacemaker/Defibrillator Patients   Tomorrow, 01/23/2020, PLEASE SEND A REMOTE DEVICE TRANSMISSION    Activity No heavy lifting or vigorous activity with your left/right arm for 6 to 8 weeks.  Do not raise your left/right arm above your head for one week.  Gradually raise your affected arm as drawn below.              01/26/2020                  01/27/2020                  01/28/2020                 01/29/2020 __  NO DRIVING for  1 week   ; you may begin driving on   11/20/930  .  WOUND CARE - Keep the wound area clean and dry.  Do not get either areas wet, no showers until cleared to at your wound check visit - Tomorrow, 01/23/2020, remove the arm sling - Tomorrow, 01/23/2020, remove the outer plastic dressings.  Underneath there are steri strips covering the wounds.  DO NOT remove these - The tape/steri-strips on your wound will fall off; do not pull them off.  No bandage is needed on the site.  DO  NOT apply any creams, oils, or ointments to the wound area. - If you notice any drainage or discharge from the wound, any swelling or bruising at the site, or you develop a fever > 101? F after you are discharged home, call the office at once.  Special Instructions - You are still able to use cellular telephones; use the ear opposite the side where you have your pacemaker/defibrillator.  Avoid carrying your cellular phone near your device. - When traveling through airports, show security personnel your identification card to avoid being screened in the metal detectors.  Ask the security personnel to use the hand wand. - Avoid arc welding equipment, MRI testing (magnetic resonance imaging), TENS units (transcutaneous nerve stimulators).  Call the office for questions about other devices. - Avoid electrical appliances that are in poor condition or are not properly grounded. - Microwave ovens are safe to be near or to operate.

## 2020-01-23 ENCOUNTER — Encounter (HOSPITAL_COMMUNITY): Payer: Self-pay | Admitting: Internal Medicine

## 2020-01-31 ENCOUNTER — Telehealth: Payer: Self-pay | Admitting: Family Medicine

## 2020-01-31 NOTE — Telephone Encounter (Signed)
Patient wants to cancel second [employer] vaccine appointment.  Will call back to reschedule.  Appointment cancelled.

## 2020-02-05 ENCOUNTER — Ambulatory Visit (INDEPENDENT_AMBULATORY_CARE_PROVIDER_SITE_OTHER): Payer: Medicare Other | Admitting: Emergency Medicine

## 2020-02-05 ENCOUNTER — Other Ambulatory Visit: Payer: Self-pay

## 2020-02-05 ENCOUNTER — Ambulatory Visit: Payer: Medicare Other

## 2020-02-05 DIAGNOSIS — I442 Atrioventricular block, complete: Secondary | ICD-10-CM | POA: Diagnosis not present

## 2020-02-13 LAB — CUP PACEART INCLINIC DEVICE CHECK
Battery Remaining Longevity: 133 mo
Battery Voltage: 3.11 V
Brady Statistic RA Percent Paced: 2.8 %
Brady Statistic RV Percent Paced: 93 %
Date Time Interrogation Session: 20210817100800
Implantable Lead Implant Date: 20210803
Implantable Lead Implant Date: 20210803
Implantable Lead Location: 753859
Implantable Lead Location: 753860
Implantable Pulse Generator Implant Date: 20210803
Lead Channel Impedance Value: 400 Ohm
Lead Channel Impedance Value: 612.5 Ohm
Lead Channel Pacing Threshold Amplitude: 0.5 V
Lead Channel Pacing Threshold Amplitude: 0.75 V
Lead Channel Pacing Threshold Pulse Width: 0.4 ms
Lead Channel Pacing Threshold Pulse Width: 0.5 ms
Lead Channel Sensing Intrinsic Amplitude: 3.3 mV
Lead Channel Setting Pacing Amplitude: 0.75 V
Lead Channel Setting Pacing Amplitude: 3.5 V
Lead Channel Setting Pacing Pulse Width: 0.5 ms
Lead Channel Setting Sensing Sensitivity: 4 mV
Pulse Gen Model: 2272
Pulse Gen Serial Number: 3852139

## 2020-02-13 NOTE — Progress Notes (Signed)
Wound check appointment. Steri-strips removed from left and right wound sites. Left and right wounds without redness or edema. Incision edges approximated, wound well healed. Normal device function. Thresholds, sensing, and impedances consistent with implant measurements. Device programmed at 3.5V/auto capture programmed on for extra safety margin until 3 month visit. Histogram distribution appropriate for patient and level of activity. No mode switches or high ventricular rates noted. Patient educated about wound care, arm mobility, lifting restrictions. ROV with Dr Rayann Heman on 05/12/20. Enrolled in remote monitoring and next remote scheduled for 04/23/20.

## 2020-03-08 ENCOUNTER — Other Ambulatory Visit: Payer: Self-pay | Admitting: Family Medicine

## 2020-03-08 DIAGNOSIS — Z780 Asymptomatic menopausal state: Secondary | ICD-10-CM

## 2020-04-08 ENCOUNTER — Other Ambulatory Visit: Payer: Self-pay | Admitting: Gastroenterology

## 2020-04-11 ENCOUNTER — Ambulatory Visit (INDEPENDENT_AMBULATORY_CARE_PROVIDER_SITE_OTHER): Payer: Medicare Other | Admitting: Nurse Practitioner

## 2020-04-11 DIAGNOSIS — J00 Acute nasopharyngitis [common cold]: Secondary | ICD-10-CM | POA: Diagnosis not present

## 2020-04-11 MED ORDER — PREDNISONE 20 MG PO TABS: ORAL_TABLET | ORAL | 0 refills | Status: DC

## 2020-04-11 NOTE — Progress Notes (Signed)
Virtual Visit via telephone Note Due to COVID-19 pandemic this visit was conducted virtually. This visit type was conducted due to national recommendations for restrictions regarding the COVID-19 Pandemic (e.g. social distancing, sheltering in place) in an effort to limit this patient's exposure and mitigate transmission in our community. All issues noted in this document were discussed and addressed.  A physical exam was not performed with this format.  I connected with Carmen Green on 04/11/20 at 3:20 by telephone and verified that I am speaking with the correct person using two identifiers. Carmen Green is currently located at home and no one is currently with her during visit. The provider, Mary-Margaret Hassell Done, FNP is located in their office at time of visit.  I discussed the limitations, risks, security and privacy concerns of performing an evaluation and management service by telephone and the availability of in person appointments. I also discussed with the patient that there may be a patient responsible charge related to this service. The patient expressed understanding and agreed to proceed.   History and Present Illness:   Chief Complaint: URI   HPI Patient says for the last 3 weeks she has had sinus issue that come and go. She takes her flonase daily. Yesterday hr congestion worsened and she became dizzy. She has had no fever, or loos of taste or smell. No body aches. She denies covid exposure and has had the moderna vaccines.   Review of Systems  Constitutional: Negative for chills and fever.  HENT: Positive for congestion. Negative for ear pain, sinus pain and sore throat.   Respiratory: Positive for cough (slight in mornings).   Neurological: Positive for dizziness. Negative for headaches.  All other systems reviewed and are negative.    Observations/Objective: Alert and oriented- answers all questions appropriately No distress Voice hoarse Sounds  congestion   Assessment and Plan: Carmen Green in today with chief complaint of URI   1. Acute nasopharyngitis 1. Take meds as prescribed 2. Use a cool mist humidifier especially during the winter months and when heat has been humid. 3. Use saline nose sprays frequently 4. Saline irrigations of the nose can be very helpful if done frequently.  * 4X daily for 1 week*  * Use of a nettie pot can be helpful with this. Follow directions with this* 5. Drink plenty of fluids 6. Keep thermostat turn down low 7.For any cough or congestion  Use plain Mucinex- regular strength or max strength is fine   * Children- consult with Pharmacist for dosing 8. For fever or aces or pains- take tylenol or ibuprofen appropriate for age and weight.  * for fevers greater than 101 orally you may alternate ibuprofen and tylenol every  3 hours.   Meds ordered this encounter  Medications  . predniSONE (DELTASONE) 20 MG tablet    Sig: 2 po at sametime daily for 5 days    Dispense:  10 tablet    Refill:  0    Order Specific Question:   Supervising Provider    Answer:   Caryl Pina A [[id number]]       Follow Up Instructions: prn    I discussed the assessment and treatment plan with the patient. The patient was provided an opportunity to ask questions and all were answered. The patient agreed with the plan and demonstrated an understanding of the instructions.   The patient was advised to call back or seek an in-person evaluation if the symptoms worsen or if the  condition fails to improve as anticipated.  The above assessment and management plan was discussed with the patient. The patient verbalized understanding of and has agreed to the management plan. Patient is aware to call the clinic if symptoms persist or worsen. Patient is aware when to return to the clinic for a follow-up visit. Patient educated on when it is appropriate to go to the emergency department.   Time call ended: 3:25   I  provided 15 minutes of non-face-to-face time during this encounter.    Mary-Margaret Hassell Done, FNP

## 2020-04-23 ENCOUNTER — Ambulatory Visit (INDEPENDENT_AMBULATORY_CARE_PROVIDER_SITE_OTHER): Payer: Medicare Other

## 2020-04-23 DIAGNOSIS — I442 Atrioventricular block, complete: Secondary | ICD-10-CM | POA: Diagnosis not present

## 2020-04-23 LAB — CUP PACEART REMOTE DEVICE CHECK
Battery Remaining Longevity: 115 mo
Battery Remaining Percentage: 95.5 %
Battery Voltage: 3.04 V
Brady Statistic AP VP Percent: 5.2 %
Brady Statistic AP VS Percent: 1 %
Brady Statistic AS VP Percent: 90 %
Brady Statistic AS VS Percent: 1.8 %
Brady Statistic RA Percent Paced: 3.3 %
Brady Statistic RV Percent Paced: 95 %
Date Time Interrogation Session: 20211103030226
Implantable Lead Implant Date: 20210803
Implantable Lead Implant Date: 20210803
Implantable Lead Location: 753859
Implantable Lead Location: 753860
Implantable Pulse Generator Implant Date: 20210803
Lead Channel Impedance Value: 400 Ohm
Lead Channel Impedance Value: 560 Ohm
Lead Channel Pacing Threshold Amplitude: 0.375 V
Lead Channel Pacing Threshold Amplitude: 0.75 V
Lead Channel Pacing Threshold Pulse Width: 0.4 ms
Lead Channel Pacing Threshold Pulse Width: 0.5 ms
Lead Channel Sensing Intrinsic Amplitude: 2.2 mV
Lead Channel Sensing Intrinsic Amplitude: 5.8 mV
Lead Channel Setting Pacing Amplitude: 0.625
Lead Channel Setting Pacing Amplitude: 3.5 V
Lead Channel Setting Pacing Pulse Width: 0.5 ms
Lead Channel Setting Sensing Sensitivity: 4 mV
Pulse Gen Model: 2272
Pulse Gen Serial Number: 3852139

## 2020-04-24 NOTE — Progress Notes (Signed)
Remote pacemaker transmission.   

## 2020-05-02 ENCOUNTER — Encounter: Payer: Self-pay | Admitting: Family Medicine

## 2020-05-02 ENCOUNTER — Other Ambulatory Visit: Payer: Self-pay

## 2020-05-02 ENCOUNTER — Ambulatory Visit (INDEPENDENT_AMBULATORY_CARE_PROVIDER_SITE_OTHER): Payer: Medicare Other | Admitting: Family Medicine

## 2020-05-02 VITALS — BP 136/78 | HR 86 | Temp 98.0°F | Resp 20 | Ht 61.0 in | Wt 137.4 lb

## 2020-05-02 DIAGNOSIS — J029 Acute pharyngitis, unspecified: Secondary | ICD-10-CM | POA: Diagnosis not present

## 2020-05-02 DIAGNOSIS — J358 Other chronic diseases of tonsils and adenoids: Secondary | ICD-10-CM | POA: Diagnosis not present

## 2020-05-02 LAB — CULTURE, GROUP A STREP

## 2020-05-02 LAB — RAPID STREP SCREEN (MED CTR MEBANE ONLY): Strep Gp A Ag, IA W/Reflex: NEGATIVE

## 2020-05-02 MED ORDER — NYSTATIN 100000 UNIT/ML MT SUSP: 5.0000 mL | Freq: Four times a day (QID) | OROMUCOSAL | 0 refills | Status: DC

## 2020-05-02 NOTE — Patient Instructions (Signed)

## 2020-05-02 NOTE — Progress Notes (Signed)
Subjective: CC: white patches on throat PCP: [name], DO  Carmen Green is a 69 y.o. female presenting to clinic today for:  1. White spots on throat Carmen Green reports white patches on her throat for 2 days. She reports her throat feels a little scratchy. Some of the white patches have resolved after gargling. She reports watery eyes, postnasal drip, and runny nose. She has a history of allergies and sinus issues. She takes flonase and zyrtec. She denies fever, cough, or exposure to strep. She has had a tonsil stone before and doesn't feel like this is the same. She reports that her dentist noticed a white patch on her throat about 2 months ago, it went away shortly after.    Relevant past medical, surgical, family, and social history reviewed and updated as indicated.  Allergies and medications reviewed and updated.  Allergies  Allergen Reactions  . Zoloft [Sertraline Hcl] Nausea Only       . Amoxapine And Related Rash  . Cephalexin Rash  . Oxycodone     "Loopy"  . Penicillins Rash   Past Medical History:  Diagnosis Date  . Allergy   . Anemia   . Anxiety   . Arthritis   . Cataract    bil cataracts removed  . Colon polyp   . Complete heart block ([place])   . Contusion of chest wall 10/10/2007   Centricity Description: CHEST WALL CONTUSION Qualifier: Diagnosis of  By: Aline Brochure MD, Dorothyann Peng   Centricity Description: CONTUSION, RIGHT RIB Qualifier: Diagnosis of  By: Aline Brochure MD, Dorothyann Peng    . Depression   . Diverticulitis   . Esophagitis   . ESOPHAGITIS 06/25/2008   Qualifier: History of  By: Nelson-Smith CMA (AAMA), Dottie    . GERD (gastroesophageal reflux disease)   . H/O seasonal allergies   . Heart murmur   . Hiatal hernia   . 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 Hypertension   . Mitral valve prolapse   . Pacemaker     Current Outpatient Medications:  .  buPROPion (WELLBUTRIN  XL) 300 MG 24 hr tablet, Take 1 tablet (300 mg total) by mouth daily., Disp: 90 tablet, Rfl: 3 .  busPIRone (BUSPAR) 10 MG tablet, Take 1 tablet (10 mg total) by mouth 3 (three) times daily., Disp: 90 tablet, Rfl: 2 .  cetirizine (ZYRTEC) 10 MG tablet, Take 10 mg by mouth daily as needed for allergies. , Disp: , Rfl:  .  cholecalciferol (VITAMIN D) 25 MCG (1000 UNIT) tablet, Take 1,000 Units by mouth daily. , Disp: , Rfl:  .  esomeprazole (NEXIUM) 40 MG capsule, TAKE 1 CAPSULE ONCE DAILY AT NOON, Disp: 90 capsule, Rfl: 0 .  fluticasone (FLONASE) 50 MCG/ACT nasal spray, Place 1 spray into both nostrils daily as needed for allergies., Disp: 48 g, Rfl: 3 .  guaiFENesin (MUCINEX) 600 MG 12 hr tablet, Take 600 mg by mouth 2 (two) times daily as needed for cough. , Disp: , Rfl:  .  hydroxypropyl methylcellulose / hypromellose (ISOPTO TEARS / GONIOVISC) 2.5 % ophthalmic solution, Place 1 drop into both eyes 2 (two) times daily as needed for dry eyes., Disp: , Rfl:  .  lisinopril-hydrochlorothiazide (ZESTORETIC) 20-12.5 MG tablet, Take 1 tablet by mouth every morning., Disp: 90 tablet, Rfl: 3 .  LORazepam (ATIVAN) 1 MG tablet, Take 0.5-1 tablets (0.5-1 mg total) by mouth 2 (two) times daily  as needed for anxiety., Disp: 180 tablet, Rfl: 1 .  meclizine (ANTIVERT) 25 MG tablet, Take 1 tablet (25 mg total) by mouth 3 (three) times daily as needed for dizziness., Disp: 30 tablet, Rfl: 0 .  medroxyPROGESTERone (PROVERA) 2.5 MG tablet, TAKE 1 TABLET DAILY, Disp: 90 tablet, Rfl: 1 .  Multiple Vitamin (MULTIVITAMIN) tablet, Take 1 tablet by mouth daily.  , Disp: , Rfl:  .  polyethylene glycol powder (GLYCOLAX/MIRALAX) powder, Take 1 Container by mouth daily as needed for mild constipation. , Disp: , Rfl:  .  trolamine salicylate (ASPERCREME) 10 % cream, Apply 1 application topically as needed for muscle pain., Disp: , Rfl:  .  estradiol (ESTRACE) 0.5 MG tablet, Take 0.5 tablets (0.25 mg total) by mouth daily for 56  days, THEN 0.5 tablets (0.25 mg total) every other day. Then stop., Disp: 42 tablet, Rfl: 0 Social History   Socioeconomic History  . Marital status: Married    Spouse name: Not on file  . Number of children: 1  . Years of education: Not on file  . Highest education level: Not on file  Occupational History  . Occupation: REGISTRATION  Tobacco Use  . Smoking status: Never Smoker  . Smokeless tobacco: Never Used  Vaping Use  . Vaping Use: Never used  Substance and Sexual Activity  . Alcohol use: No  . Drug use: No  . Sexual activity: Not on file  Other Topics Concern  . Not on file  Social History Narrative   Lives with spouse in [place]   Works for an orthopoedic office in [place] Strain:   . Difficulty of Paying Living Expenses: Not on file  Food Insecurity:   . Worried About Charity fundraiser in the Last Year: Not on file  . Ran Out of Food in the Last Year: Not on file  Transportation Needs:   . Lack of Transportation (Medical): Not on file  . Lack of Transportation (Non-Medical): Not on file  Physical Activity:   . Days of Exercise per Week: Not on file  . Minutes of Exercise per Session: Not on file  Stress:   . Feeling of Stress : Not on file  Social Connections:   . Frequency of Communication with Friends and Family: Not on file  . Frequency of Social Gatherings with Friends and Family: Not on file  . Attends Religious Services: Not on file  . Active Member of Clubs or Organizations: Not on file  . Attends Archivist Meetings: Not on file  . Marital Status: Not on file  Intimate Partner Violence:   . Fear of Current or Ex-Partner: Not on file  . Emotionally Abused: Not on file  . Physically Abused: Not on file  . Sexually Abused: Not on file   Family History  Problem Relation Age of Onset  . Diabetes Mother   . Stroke Mother   . Diabetes Father   . Lung cancer Father   . Lung  cancer Maternal Grandfather   . Heart disease Maternal Aunt   . Heart disease Maternal Grandmother   . Breast cancer Maternal Aunt   . Colon cancer Neg Hx   . Esophageal cancer Neg Hx   . Pancreatic cancer Neg Hx   . Rectal cancer Neg Hx   . Stomach cancer Neg Hx     Review of Systems  Per HPI.   Objective:  Office vital signs reviewed. BP 136/78   Pulse 86   Temp 98 F (36.7 C) (Temporal)   Resp 20   Ht 5\' 1"  (1.549 m)   Wt 137 lb 6 oz (62.3 kg)   SpO2 100%   BMI 25.96 kg/m   Physical Examination:  Physical Exam Vitals and nursing note reviewed.  Constitutional:      General: She is not in acute distress.    Appearance: She is not ill-appearing, toxic-appearing or diaphoretic.  HENT:     Head: Normocephalic and atraumatic.     Right Ear: Tympanic membrane and ear canal normal.     Left Ear: Tympanic membrane and ear canal normal.     Nose: Rhinorrhea present. No congestion.     Mouth/Throat:     Mouth: Mucous membranes are moist. No oral lesions.     Pharynx: Uvula midline. Oropharyngeal exudate (posterior oropharynx) present. No pharyngeal swelling, posterior oropharyngeal erythema or uvula swelling.     Tonsils: Tonsillar exudate (small white patches bilaterally) present. No tonsillar abscesses. 0 on the right. 0 on the left.  Eyes:     Extraocular Movements:     Right eye: Normal extraocular motion.     Left eye: Normal extraocular motion.     Conjunctiva/sclera: Conjunctivae normal.  Neck:     Thyroid: No thyromegaly.  Cardiovascular:     Rate and Rhythm: Normal rate and regular rhythm.     Heart sounds: Normal heart sounds. No murmur heard.   Pulmonary:     Effort: Pulmonary effort is normal. No respiratory distress.     Breath sounds: Normal breath sounds.  Musculoskeletal:     Cervical back: Normal range of motion and neck supple.  Lymphadenopathy:     Cervical: No cervical adenopathy.  Skin:    General: Skin is warm and dry.  Neurological:      General: No focal deficit present.     Mental Status: She is alert and oriented to person, place, and time.      Results for orders placed or performed in visit on 04/23/20  CUP PACEART REMOTE DEVICE CHECK  Result Value Ref Range   Date Time Interrogation Session [id number]    Pulse Generator Manufacturer [employer]    Pulse Gen Model 2272 Assurity MRI    Pulse Gen Serial Number [id number]    Clinic Name [facility] Pulse Generator Type Implantable Pulse Generator    Implantable Pulse Generator Implant Date [id number]    Implantable Lead Manufacturer [facility]    Implantable Lead Model LPA1200M Tendril MRI    Implantable Lead Serial Number [id number]    Implantable Lead Implant Date [id number]    Implantable Lead Location Detail 1 UNKNOWN    Implantable Lead Location [id number]    Implantable Lead Manufacturer [facility]    Implantable Lead Model LPA1200M Tendril MRI    Implantable Lead Serial Number [id number]    Implantable Lead Implant Date 44010272    Implantable Lead Location Detail 1 UNKNOWN    Implantable Lead Location [id number]    Lead Channel Setting Sensing Sensitivity 4.0 mV   Lead Channel Setting Sensing Adaptation Mode Fixed Pacing    Lead Channel Setting Pacing Amplitude 3.5 V   Lead Channel Setting Pacing Pulse Width 0.5 ms   Lead Channel Setting Pacing Amplitude 0.625    Lead Channel Status NULL    Lead Channel Impedance Value 400 ohm   Lead Channel Sensing Intrinsic Amplitude 2.2 mV   Lead Channel  Pacing Threshold Amplitude 0.75 V   Lead Channel Pacing Threshold Pulse Width 0.4 ms   Lead Channel Status NULL    Lead Channel Impedance Value 560 ohm   Lead Channel Sensing Intrinsic Amplitude 5.8 mV   Lead Channel Pacing Threshold Amplitude 0.375 V   Lead Channel Pacing Threshold Pulse Width 0.5 ms   Battery Status MOS    Battery Remaining Longevity 115 mo   Battery Remaining Percentage 95.5 %   Battery Voltage 3.04 V   Brady Statistic RA Percent Paced 3.3 %   Brady  Statistic RV Percent Paced 95.0 %   Brady Statistic AP VP Percent 5.2 %   Brady Statistic AS VP Percent 90.0 %   Brady Statistic AP VS Percent 1.0 %   Brady Statistic AS VS Percent 1.8 %     Assessment/ Plan: Revia was seen today for sore throat.  Diagnoses and all orders for this visit:  Tonsillar exudate Strep negative. No fever, enlarged tonsils, or cervical adenopathy on exam. No known exposure to strep. ?viral etiology vs ?candida. Will treat for candida today. Return to office for new or worsening symptoms, or if symptoms persist.  -     Rapid Strep Screen ([facility] ONLY) -     nystatin (MYCOSTATIN) 100000 UNIT/ML suspension; Use as directed 5 mLs (500,000 Units total) in the mouth or throat 4 (four) times daily.  Follow up as needed.   The above assessment and management plan was discussed with the patient. The patient verbalized understanding of and has agreed to the management plan. Patient is aware to call the clinic if symptoms persist or worsen. Patient is aware when to return to the clinic for a follow-up visit. Patient educated on when it is appropriate to go to the emergency department.   Marjorie Smolder, FNP-C [facility] [address] [place], [place] [address] [phone]

## 2020-05-04 DIAGNOSIS — N3001 Acute cystitis with hematuria: Secondary | ICD-10-CM | POA: Diagnosis not present

## 2020-05-04 DIAGNOSIS — R300 Dysuria: Secondary | ICD-10-CM | POA: Diagnosis not present

## 2020-05-12 ENCOUNTER — Other Ambulatory Visit: Payer: Self-pay

## 2020-05-12 ENCOUNTER — Ambulatory Visit: Payer: Medicare Other | Admitting: Internal Medicine

## 2020-05-12 ENCOUNTER — Encounter: Payer: Self-pay | Admitting: Internal Medicine

## 2020-05-12 VITALS — BP 134/76 | HR 86 | Ht 61.0 in | Wt 140.2 lb

## 2020-05-12 DIAGNOSIS — I10 Essential (primary) hypertension: Secondary | ICD-10-CM | POA: Diagnosis not present

## 2020-05-12 DIAGNOSIS — I442 Atrioventricular block, complete: Secondary | ICD-10-CM

## 2020-05-12 LAB — CUP PACEART INCLINIC DEVICE CHECK
Battery Remaining Longevity: 130 mo
Battery Voltage: 3.02 V
Brady Statistic RA Percent Paced: 3.2 %
Brady Statistic RV Percent Paced: 96 %
Date Time Interrogation Session: 20211122141741
Implantable Lead Implant Date: 20210803
Implantable Lead Implant Date: 20210803
Implantable Lead Location: 753859
Implantable Lead Location: 753860
Implantable Pulse Generator Implant Date: 20210803
Lead Channel Impedance Value: 400 Ohm
Lead Channel Impedance Value: 512.5 Ohm
Lead Channel Pacing Threshold Amplitude: 0.5 V
Lead Channel Pacing Threshold Amplitude: 0.5 V
Lead Channel Pacing Threshold Pulse Width: 0.4 ms
Lead Channel Pacing Threshold Pulse Width: 0.5 ms
Lead Channel Sensing Intrinsic Amplitude: 2.4 mV
Lead Channel Sensing Intrinsic Amplitude: 9.7 mV
Lead Channel Setting Pacing Amplitude: 0.75 V
Lead Channel Setting Pacing Amplitude: 2 V
Lead Channel Setting Pacing Pulse Width: 0.5 ms
Lead Channel Setting Sensing Sensitivity: 4 mV
Pulse Gen Model: 2272
Pulse Gen Serial Number: 3852139

## 2020-05-12 NOTE — Patient Instructions (Addendum)
Medication Instructions:  Your physician recommends that you continue on your current medications as directed. Please refer to the Current Medication list given to you today.  *If you need a refill on your cardiac medications before your next appointment, please call your pharmacy*  Lab Work: None ordered.  If you have labs (blood work) drawn today and your tests are completely normal, you will receive your results only by: Marland Kitchen MyChart Message (if you have MyChart) OR . A paper copy in the mail If you have any lab test that is abnormal or we need to change your treatment, we will call you to review the results.  Testing/Procedures: None ordered.  Follow-Up: At [facility], you and your health needs are our priority.  As part of our continuing mission to provide you with exceptional heart care, we have created designated Provider Care Teams.  These Care Teams include your primary Cardiologist (physician) and Advanced Practice Providers (APPs -  Physician Assistants and Nurse Practitioners) who all work together to provide you with the care you need, when you need it.  We recommend signing up for the patient portal called "MyChart".  Sign up information is provided on this After Visit Summary.  MyChart is used to connect with patients for Virtual Visits (Telemedicine).  Patients are able to view lab/test results, encounter notes, upcoming appointments, etc.  Non-urgent messages can be sent to your provider as well.   To learn more about what you can do with MyChart, go to [email or web address].    Your next appointment:   Your physician wants you to follow-up in: 1 year with Tommye Standard. You will receive a reminder letter in the mail two months in advance. If you don't receive a letter, please call our office to schedule the follow-up appointment.  Remote monitoring is used to monitor your Pacemaker from home. This monitoring reduces the number of office visits required to check your device  to one time per year. It allows [place] to keep an eye on the functioning of your device to ensure it is working properly. You are scheduled for a device check from home on 07/23/20. You may send your transmission at any time that day. If you have a wireless device, the transmission will be sent automatically. After your physician reviews your transmission, you will receive a postcard with your next transmission date.  Other Instructions:

## 2020-05-12 NOTE — Progress Notes (Signed)
PCP: [name], DO   Primary EP:  Dr Donzetta Kohut is a 69 y.o. female who presents today for routine electrophysiology followup.  Since her generator change, the patient reports doing very well.  She has healed nicely.  She has rare palpitations/ "twitches", likely due to PVCs.  Today, she denies symptoms of chest pain, shortness of breath,  lower extremity edema, dizziness, presyncope, or syncope.  The patient is otherwise without complaint today.   Past Medical History:  Diagnosis Date  . Allergy   . Anemia   . Anxiety   . Arthritis   . Cataract    bil cataracts removed  . Colon polyp   . Complete heart block ([place])   . Contusion of chest wall 10/10/2007   Centricity Description: CHEST WALL CONTUSION Qualifier: Diagnosis of  By: Aline Brochure MD, Dorothyann Peng   Centricity Description: CONTUSION, RIGHT RIB Qualifier: Diagnosis of  By: Aline Brochure MD, Dorothyann Peng    . Depression   . Diverticulitis   . Esophagitis   . ESOPHAGITIS 06/25/2008   Qualifier: History of  By: Nelson-Smith CMA (AAMA), Dottie    . GERD (gastroesophageal reflux disease)   . H/O seasonal allergies   . Heart murmur   . Hiatal hernia   . 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 Hypertension   . Mitral valve prolapse   . Pacemaker    Past Surgical History:  Procedure Laterality Date  . APPENDECTOMY    . BREAST EXCISIONAL BIOPSY Right   . BREAST SURGERY Right    biopsy  . COLONOSCOPY  10/19/2004   LEC  . EYE SURGERY Left    cataract extraction with IOL  . LAPAROSCOPIC LOW ANTERIOR RESECTION  11/16/2013   Robotic  . LAPAROSCOPIC LYSIS OF ADHESIONS N/A 11/15/2013   Procedure: LAPAROSCOPIC LYSIS OF ADHESIONS;  Surgeon: Adin Hector, MD;  Location: [facility];  Service: General;  Laterality: N/A;  . left ovary cyst removal  1972  . PACEMAKER IMPLANT N/A 01/22/2020   Procedure: PACEMAKER IMPLANT;  Surgeon: Thompson Grayer, MD;   Location: [facility];  Service: Cardiovascular;  Laterality: N/A;  . [place], 2004, 2012   device dependant  . POLYPECTOMY    . PROCTOSCOPY  11/15/2013   Procedure: RIGID PROCTOSCOPY;  Surgeon: Adin Hector, MD;  Location: [facility];  Service: General;;  . ROBOTIC ASSISTED SALPINGO OOPHERECTOMY Left 11/16/2013   Robotic en bloc w LAR resection  . TUBAL LIGATION    . UPPER GASTROINTESTINAL ENDOSCOPY      ROS- all systems are reviewed and negative except as per HPI above  Current Outpatient Medications  Medication Sig Dispense Refill  . buPROPion (WELLBUTRIN XL) 300 MG 24 hr tablet Take 1 tablet (300 mg total) by mouth daily. 90 tablet 3  . busPIRone (BUSPAR) 10 MG tablet Take 1 tablet (10 mg total) by mouth 3 (three) times daily. 90 tablet 2  . cetirizine (ZYRTEC) 10 MG tablet Take 10 mg by mouth daily as needed for allergies.     . cholecalciferol (VITAMIN D) 25 MCG (1000 UNIT) tablet Take 1,000 Units by mouth daily.     Marland Kitchen esomeprazole (NEXIUM) 40 MG capsule TAKE 1 CAPSULE ONCE DAILY AT NOON 90 capsule 0  . fluticasone (FLONASE) 50 MCG/ACT nasal spray Place 1 spray into both nostrils daily as needed for allergies. 48 g 3  .  guaiFENesin (MUCINEX) 600 MG 12 hr tablet Take 600 mg by mouth 2 (two) times daily as needed for cough.     . hydroxypropyl methylcellulose / hypromellose (ISOPTO TEARS / GONIOVISC) 2.5 % ophthalmic solution Place 1 drop into both eyes 2 (two) times daily as needed for dry eyes.    Marland Kitchen lisinopril-hydrochlorothiazide (ZESTORETIC) 20-12.5 MG tablet Take 1 tablet by mouth every morning. 90 tablet 3  . LORazepam (ATIVAN) 1 MG tablet Take 0.5-1 tablets (0.5-1 mg total) by mouth 2 (two) times daily as needed for anxiety. 180 tablet 1  . meclizine (ANTIVERT) 25 MG tablet Take 1 tablet (25 mg total) by mouth 3 (three) times daily as needed for dizziness. 30 tablet 0  . medroxyPROGESTERone (PROVERA) 2.5 MG tablet TAKE 1 TABLET DAILY 90 tablet 1  . Multiple  Vitamin (MULTIVITAMIN) tablet Take 1 tablet by mouth daily.      Marland Kitchen nystatin (MYCOSTATIN) 100000 UNIT/ML suspension Use as directed 5 mLs (500,000 Units total) in the mouth or throat 4 (four) times daily. 100 mL 0  . polyethylene glycol powder (GLYCOLAX/MIRALAX) powder Take 1 Container by mouth daily as needed for mild constipation.     . trolamine salicylate (ASPERCREME) 10 % cream Apply 1 application topically as needed for muscle pain.    Marland Kitchen estradiol (ESTRACE) 0.5 MG tablet Take 0.5 tablets (0.25 mg total) by mouth daily for 56 days, THEN 0.5 tablets (0.25 mg total) every other day. Then stop. 42 tablet 0   No current facility-administered medications for this visit.    Physical Exam: Vitals:   05/12/20 1344  BP: 134/76  Pulse: 86  SpO2: 91%  Weight: 140 lb 3.2 oz (63.6 kg)  Height: 5\' 1"  (1.549 m)    GEN- The patient is well appearing, alert and oriented x 3 today.   Head- normocephalic, atraumatic Eyes-  Sclera clear, conjunctiva pink Ears- hearing intact Oropharynx- clear Lungs-   normal work of breathing Chest- pacemaker pocket is well healed Heart- Regular rate and rhythm (paced) GI- soft, NT, ND, + BS Extremities- no clubbing, cyanosis, or edema  Pacemaker interrogation- reviewed in detail today,  See PACEART report  ekg tracing ordered today is personally reviewed and shows sinus with V pacing  Assessment and Plan:  1. Symptomatic complete heart block Normal pacemaker function See Claudia Desanctis Art report she is device dependant today  2. HTN Stable No change required today  3. PVCs Stable No change required today   Risks, benefits and potential toxicities for medications prescribed and/or refilled reviewed with patient today.   Return in a year to see EP PA  Thompson Grayer MD, [facility] 05/12/2020 2:09 PM

## 2020-05-18 DIAGNOSIS — N390 Urinary tract infection, site not specified: Secondary | ICD-10-CM | POA: Diagnosis not present

## 2020-05-18 DIAGNOSIS — J329 Chronic sinusitis, unspecified: Secondary | ICD-10-CM | POA: Diagnosis not present

## 2020-05-18 DIAGNOSIS — R319 Hematuria, unspecified: Secondary | ICD-10-CM | POA: Diagnosis not present

## 2020-05-18 DIAGNOSIS — R300 Dysuria: Secondary | ICD-10-CM | POA: Diagnosis not present

## 2020-05-22 ENCOUNTER — Telehealth: Payer: Self-pay | Admitting: Family Medicine

## 2020-05-22 ENCOUNTER — Other Ambulatory Visit: Payer: Self-pay | Admitting: *Deleted

## 2020-05-22 DIAGNOSIS — J358 Other chronic diseases of tonsils and adenoids: Secondary | ICD-10-CM

## 2020-05-22 MED ORDER — NYSTATIN 100000 UNIT/ML MT SUSP: 5.0000 mL | Freq: Four times a day (QID) | OROMUCOSAL | 0 refills | Status: DC

## 2020-05-22 NOTE — Telephone Encounter (Signed)
  Incoming Patient Call  05/22/2020  What symptoms do you have? White spots in throat (thrush), uti, sinus infection   How long have you been sick? 11/12  Have you been seen for this problem? yes  If your provider decides to give you a prescription, which pharmacy would you like for it to be sent to? [facility]  Pt seen TM on 11/12 for white spots in throat and given mouthwash for thrush, seen at [facility] urgent care 11/14 for uti - given antibiotic, seen 11/28 uti came back and sinus infection - given antibiotic, prednisone and nasal spray   States that uti and sinus infection is better but white spots are back, she stopped mouthwash when she thought it was better, wants to know if antibiotic could cause thrush  Patient informed that this information will be sent to the clinical staff for review and that they should receive a follow up call.

## 2020-05-22 NOTE — Telephone Encounter (Signed)
Okay to go ahead and send nystatin swish and swallow for the patient

## 2020-05-22 NOTE — Telephone Encounter (Signed)
Nystatin sent and patient aware

## 2020-05-22 NOTE — Telephone Encounter (Signed)
If continues, please schedule an appointment to come in and be seen so we can evaluate especially if she has another UTI.

## 2020-05-22 NOTE — Telephone Encounter (Signed)
Returned patient's phone call.  Patient states that she was diagnosed with thrush on 11/12 and was prescribed Nystatin mouthwash which got better after 3-4 days.  On 11/14 patient went to urgent care for a UTI and was prescribed macrobid and prednisone, finished macrobid on 11/24.  Woke up on 11/28 with another UTI and sinus infection at urgent care.  Patient was prescribed Pyridium, bactrim, nasal spray and prednisone.  Patient states that she woke up yesterday with white spot in her mouth like when she was diagnosed with thrush.  Advised patient she may want to start a probiotic and eat yogurt.   Patient would like to know if she can have Mouthwash and/or diflucan sent to pharmacy.  Patient states that she is miserable and just wants to feel better

## 2020-06-02 ENCOUNTER — Telehealth: Payer: Self-pay

## 2020-06-02 ENCOUNTER — Other Ambulatory Visit: Payer: Self-pay | Admitting: Family Medicine

## 2020-06-02 DIAGNOSIS — F411 Generalized anxiety disorder: Secondary | ICD-10-CM

## 2020-06-02 NOTE — Telephone Encounter (Signed)
Patient was seen at urgent care on 11/28 and was given bactrium for uti.  Patient states they just called her last week and told her that the urine culture came back and she needed to take macrobid.  Patient has not started new abx since she is no longer having urinary frequency or dysuria.  Patient would like to know if Dr. Darnell Level thinks she needs to start abx?

## 2020-06-02 NOTE — Telephone Encounter (Signed)
Patient aware and verbalizes understanding. 

## 2020-06-02 NOTE — Telephone Encounter (Signed)
If she is asymptomatic, unlikely that she needs meds

## 2020-06-19 ENCOUNTER — Other Ambulatory Visit: Payer: Self-pay | Admitting: Family Medicine

## 2020-06-19 DIAGNOSIS — F411 Generalized anxiety disorder: Secondary | ICD-10-CM

## 2020-06-25 ENCOUNTER — Other Ambulatory Visit: Payer: Self-pay | Admitting: Family Medicine

## 2020-06-25 DIAGNOSIS — F411 Generalized anxiety disorder: Secondary | ICD-10-CM

## 2020-06-30 ENCOUNTER — Other Ambulatory Visit: Payer: Self-pay | Admitting: Gastroenterology

## 2020-07-15 ENCOUNTER — Ambulatory Visit (INDEPENDENT_AMBULATORY_CARE_PROVIDER_SITE_OTHER): Payer: Medicare Other | Admitting: Family Medicine

## 2020-07-15 ENCOUNTER — Other Ambulatory Visit: Payer: Self-pay

## 2020-07-15 ENCOUNTER — Encounter: Payer: Self-pay | Admitting: Family Medicine

## 2020-07-15 VITALS — BP 126/68 | HR 62 | Temp 97.5°F | Resp 20 | Ht 61.0 in | Wt 141.0 lb

## 2020-07-15 DIAGNOSIS — F411 Generalized anxiety disorder: Secondary | ICD-10-CM

## 2020-07-15 DIAGNOSIS — Z79899 Other long term (current) drug therapy: Secondary | ICD-10-CM

## 2020-07-15 DIAGNOSIS — Z780 Asymptomatic menopausal state: Secondary | ICD-10-CM | POA: Diagnosis not present

## 2020-07-15 DIAGNOSIS — Z87440 Personal history of urinary (tract) infections: Secondary | ICD-10-CM | POA: Diagnosis not present

## 2020-07-15 DIAGNOSIS — N390 Urinary tract infection, site not specified: Secondary | ICD-10-CM

## 2020-07-15 DIAGNOSIS — I10 Essential (primary) hypertension: Secondary | ICD-10-CM

## 2020-07-15 LAB — URINALYSIS, COMPLETE
Bilirubin, UA: NEGATIVE
Glucose, UA: NEGATIVE
Ketones, UA: NEGATIVE
Nitrite, UA: POSITIVE — AB
Protein,UA: NEGATIVE
RBC, UA: NEGATIVE
Specific Gravity, UA: 1.015 (ref 1.005–1.030)
Urobilinogen, Ur: 0.2 mg/dL (ref 0.2–1.0)
pH, UA: 6.5 (ref 5.0–7.5)

## 2020-07-15 LAB — MICROSCOPIC EXAMINATION
Epithelial Cells (non renal): NONE SEEN /hpf (ref 0–10)
RBC, Urine: NONE SEEN /hpf (ref 0–2)

## 2020-07-15 MED ORDER — BUSPIRONE HCL 15 MG PO TABS: 15.0000 mg | ORAL_TABLET | Freq: Three times a day (TID) | ORAL | 0 refills | Status: DC

## 2020-07-15 MED ORDER — NITROFURANTOIN MONOHYD MACRO 100 MG PO CAPS: 100.0000 mg | ORAL_CAPSULE | Freq: Two times a day (BID) | ORAL | 0 refills | Status: AC

## 2020-07-15 MED ORDER — LORAZEPAM 1 MG PO TABS: ORAL_TABLET | ORAL | 1 refills | Status: DC

## 2020-07-15 NOTE — Progress Notes (Signed)
Subjective: CC: GAD/ estrogen deficiency PCP: [name], DO XFG:HWEXHB Carmen Green is a 70 y.o. female presenting to clinic today for:  1. GAD Doing better on increased dose of Buspar 10mg .  Thinks she would benefit from pushing dose.  Multiple stressors at home. Spouse and daughter dependent on her for most things.  This becomes overwhelming sometimes.    2. Estrogen deficiency Off estrogen.  Mood lability as above  3. Recurrent UTI Had 3 UTIs since November.  No change in diet, bowel movements.  Not currently having severe symptoms.  Would like urine checked.  Wears urine pads.  ROS: Per HPI  Allergies  Allergen Reactions  . Zoloft [Sertraline Hcl] Nausea Only       . Amoxapine And Related Rash  . Cephalexin Rash  . Oxycodone     "Loopy"  . Penicillins Rash   Past Medical History:  Diagnosis Date  . Allergy   . Anemia   . Anxiety   . Arthritis   . Cataract    bil cataracts removed  . Colon polyp   . Complete heart block ([place])   . Contusion of chest wall 10/10/2007   Centricity Description: CHEST WALL CONTUSION Qualifier: Diagnosis of  By: Aline Brochure MD, Dorothyann Peng   Centricity Description: CONTUSION, RIGHT RIB Qualifier: Diagnosis of  By: Aline Brochure MD, Dorothyann Peng    . Depression   . Diverticulitis   . Esophagitis   . ESOPHAGITIS 06/25/2008   Qualifier: History of  By: Nelson-Smith CMA (AAMA), Dottie    . GERD (gastroesophageal reflux disease)   . H/O seasonal allergies   . Heart murmur   . Hiatal hernia   . 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 Hypertension   . Mitral valve prolapse   . Pacemaker     Current Outpatient Medications:  .  buPROPion (WELLBUTRIN XL) 300 MG 24 hr tablet, Take 1 tablet (300 mg total) by mouth daily., Disp: 90 tablet, Rfl: 3 .  busPIRone (BUSPAR) 10 MG tablet, TAKE 1 TABLET 3 TIMES A DAY, Disp: 90 tablet, Rfl: 0 .  cetirizine (ZYRTEC) 10 MG tablet, Take 10  mg by mouth daily as needed for allergies. , Disp: , Rfl:  .  cholecalciferol (VITAMIN D) 25 MCG (1000 UNIT) tablet, Take 1,000 Units by mouth daily. , Disp: , Rfl:  .  esomeprazole (NEXIUM) 40 MG capsule, TAKE 1 CAPSULE ONCE DAILY AT NOON, Disp: 90 capsule, Rfl: 0 .  estradiol (ESTRACE) 0.5 MG tablet, Take 0.5 tablets (0.25 mg total) by mouth daily for 56 days, THEN 0.5 tablets (0.25 mg total) every other day. Then stop., Disp: 42 tablet, Rfl: 0 .  fluticasone (FLONASE) 50 MCG/ACT nasal spray, Place 1 spray into both nostrils daily as needed for allergies., Disp: 48 g, Rfl: 3 .  guaiFENesin (MUCINEX) 600 MG 12 hr tablet, Take 600 mg by mouth 2 (two) times daily as needed for cough. , Disp: , Rfl:  .  hydroxypropyl methylcellulose / hypromellose (ISOPTO TEARS / GONIOVISC) 2.5 % ophthalmic solution, Place 1 drop into both eyes 2 (two) times daily as needed for dry eyes., Disp: , Rfl:  .  lisinopril-hydrochlorothiazide (ZESTORETIC) 20-12.5 MG tablet, Take 1 tablet by mouth every morning., Disp: 90 tablet, Rfl: 3 .  LORazepam (ATIVAN) 1 MG tablet, Take 0.5-1 tablets (0.5-1 mg total) by mouth 2 (two) times daily as needed for anxiety., Disp: 180 tablet, Rfl: 1 .  meclizine (ANTIVERT) 25 MG tablet, Take 1 tablet (25 mg total) by mouth 3 (three) times daily as needed for dizziness., Disp: 30 tablet, Rfl: 0 .  medroxyPROGESTERone (PROVERA) 2.5 MG tablet, TAKE 1 TABLET DAILY, Disp: 90 tablet, Rfl: 1 .  Multiple Vitamin (MULTIVITAMIN) tablet, Take 1 tablet by mouth daily.  , Disp: , Rfl:  .  nystatin (MYCOSTATIN) 100000 UNIT/ML suspension, Use as directed 5 mLs (500,000 Units total) in the mouth or throat 4 (four) times daily., Disp: 100 mL, Rfl: 0 .  polyethylene glycol powder (GLYCOLAX/MIRALAX) powder, Take 1 Container by mouth daily as needed for mild constipation. , Disp: , Rfl:  .  trolamine salicylate (ASPERCREME) 10 % cream, Apply 1 application topically as needed for muscle pain., Disp: , Rfl:  Social  History   Socioeconomic History  . Marital status: Married    Spouse name: Not on file  . Number of children: 1  . Years of education: Not on file  . Highest education level: Not on file  Occupational History  . Occupation: REGISTRATION  Tobacco Use  . Smoking status: Never Smoker  . Smokeless tobacco: Never Used  Vaping Use  . Vaping Use: Never used  Substance and Sexual Activity  . Alcohol use: No  . Drug use: No  . Sexual activity: Not on file  Other Topics Concern  . Not on file  Social History Narrative   Lives with spouse in [place]   Works for an orthopoedic office in [place] Strain: Not on file  Food Insecurity: Not on file  Transportation Needs: Not on file  Physical Activity: Not on file  Stress: Not on file  Social Connections: Not on file  Intimate Partner Violence: Not on file   Family History  Problem Relation Age of Onset  . Diabetes Mother   . Stroke Mother   . Diabetes Father   . Lung cancer Father   . Lung cancer Maternal Grandfather   . Heart disease Maternal Aunt   . Heart disease Maternal Grandmother   . Breast cancer Maternal Aunt   . Colon cancer Neg Hx   . Esophageal cancer Neg Hx   . Pancreatic cancer Neg Hx   . Rectal cancer Neg Hx   . Stomach cancer Neg Hx     Objective: Office vital signs reviewed. BP 126/68   Pulse 62   Temp (!) 97.5 F (36.4 C)   Resp 20   Ht 5\' 1"  (1.549 m)   Wt 141 lb (64 kg)   SpO2 98%   BMI 26.64 kg/m   Physical Examination:  General: Awake, alert, well nourished, No acute distress HEENT: Normal, sclera white, MMM Cardio: regular rate and rhythm, S1S2 heard, no murmurs appreciated Pulm: clear to auscultation bilaterally, no wheezes, rhonchi or rales; normal work of breathing on room air Extremities: warm, well perfused, No edema, cyanosis or clubbing; +2 pulses bilaterally MSK: normal gait and station Neuro: no tremor Psych: mood  stable Depression screen [facility] 2/9 07/15/2020 05/02/2020 12/04/2019  Decreased Interest 0 0 0  Down, Depressed, Hopeless 0 0 1  PHQ - 2 Score 0 0 1  Altered sleeping - - 1  Tired, decreased energy - - 1  Change in appetite - - 0  Feeling bad or failure about yourself  - - 0  Trouble concentrating - - 0  Moving slowly or fidgety/restless - - 0  Suicidal  thoughts - - 0  PHQ-9 Score - - 3  Some recent data might be hidden   GAD 7 : Generalized Anxiety Score 07/15/2020 12/04/2019 05/20/2019  Nervous, Anxious, on Edge 1 2 2   Control/stop worrying 2 3 2   Worry too much - different things 2 3 2   Trouble relaxing 3 3 1   Restless 1 1 1   Easily annoyed or irritable 1 1 2   Afraid - awful might happen 1 2 2   Total GAD 7 Score 11 15 12   Anxiety Difficulty Somewhat difficult Very difficult Somewhat difficult      Results for orders placed or performed in visit on 07/15/20 (from the past 24 hour(s))  Urinalysis, Complete     Status: Abnormal   Collection Time: 07/15/20 10:23 AM  Result Value Ref Range   Specific Gravity, UA 1.015 1.005 - 1.030   pH, UA 6.5 5.0 - 7.5   Color, UA Yellow Yellow   Appearance Ur Clear Clear   Leukocytes,UA 2+ (A) Negative   Protein,UA Negative Negative/Trace   Glucose, UA Negative Negative   Ketones, UA Negative Negative   RBC, UA Negative Negative   Bilirubin, UA Negative Negative   Urobilinogen, Ur 0.2 0.2 - 1.0 mg/dL   Nitrite, UA Positive (A) Negative   Microscopic Examination See below:    Narrative   Performed at:  [facility] [address] [place], [place]  [id number] Lab Director: Colletta [place] [facility], Phone:  [phone]  Microscopic Examination     Status: Abnormal   Collection Time: 07/15/20 10:23 AM   Urine  Result Value Ref Range   WBC, UA 11-30 (A) 0 - 5 /hpf   RBC None seen 0 - 2 /hpf   Epithelial Cells (non renal) None seen 0 - 10 /hpf   Bacteria, UA Few None seen/Few   Narrative   Performed at:  [facility] [address], [place], [place]  [id number] Lab Director: Colletta [place] [facility], Phone:  [phone]     Assessment/ Plan: 70 y.o. female   Essential hypertension  Postmenopausal  Anxiety state - Plan: LORazepam (ATIVAN) 1 MG tablet  Chronic prescription benzodiazepine use  Recent urinary tract infection - Plan: Urine Culture, Urinalysis, Complete, nitrofurantoin, macrocrystal-monohydrate, (MACROBID) 100 MG capsule  Recurrent UTI - Plan: Ambulatory referral to Urology, nitrofurantoin, macrocrystal-monohydrate, (MACROBID) 100 MG capsule  BP controlled, continue current regimen  Off estrogen, doing well  Ativan renewed for prn use.  Caution.  The Narcotic Database has been reviewed.  There were no red flags.    Recurrent UTI, macrobid rx'd based on previous culture and current allergies.  Referral to urology.  No orders of the defined types were placed in this encounter.  No orders of the defined types were placed in this encounter.    [name], DO [place] [phone]

## 2020-07-18 LAB — URINE CULTURE

## 2020-07-22 ENCOUNTER — Other Ambulatory Visit: Payer: Self-pay

## 2020-07-22 ENCOUNTER — Ambulatory Visit (INDEPENDENT_AMBULATORY_CARE_PROVIDER_SITE_OTHER): Payer: Medicare Other

## 2020-07-22 DIAGNOSIS — Z23 Encounter for immunization: Secondary | ICD-10-CM

## 2020-07-23 ENCOUNTER — Ambulatory Visit (INDEPENDENT_AMBULATORY_CARE_PROVIDER_SITE_OTHER): Payer: Medicare Other

## 2020-07-23 DIAGNOSIS — I442 Atrioventricular block, complete: Secondary | ICD-10-CM | POA: Diagnosis not present

## 2020-07-23 LAB — CUP PACEART REMOTE DEVICE CHECK
Battery Remaining Longevity: 127 mo
Battery Remaining Percentage: 95.5 %
Battery Voltage: 3.02 V
Brady Statistic AP VP Percent: 4.4 %
Brady Statistic AP VS Percent: 1 %
Brady Statistic AS VP Percent: 93 %
Brady Statistic AS VS Percent: 1.1 %
Brady Statistic RA Percent Paced: 3.3 %
Brady Statistic RV Percent Paced: 98 %
Date Time Interrogation Session: 20220202020014
Implantable Lead Implant Date: 20210803
Implantable Lead Implant Date: 20210803
Implantable Lead Location: 753859
Implantable Lead Location: 753860
Implantable Pulse Generator Implant Date: 20210803
Lead Channel Impedance Value: 440 Ohm
Lead Channel Impedance Value: 480 Ohm
Lead Channel Pacing Threshold Amplitude: 0.5 V
Lead Channel Pacing Threshold Amplitude: 0.75 V
Lead Channel Pacing Threshold Pulse Width: 0.4 ms
Lead Channel Pacing Threshold Pulse Width: 0.5 ms
Lead Channel Sensing Intrinsic Amplitude: 3.6 mV
Lead Channel Sensing Intrinsic Amplitude: 8.1 mV
Lead Channel Setting Pacing Amplitude: 1 V
Lead Channel Setting Pacing Amplitude: 2 V
Lead Channel Setting Pacing Pulse Width: 0.5 ms
Lead Channel Setting Sensing Sensitivity: 4 mV
Pulse Gen Model: 2272
Pulse Gen Serial Number: 3852139

## 2020-07-28 NOTE — Progress Notes (Signed)
Remote pacemaker transmission.   

## 2020-08-08 DIAGNOSIS — H35373 Puckering of macula, bilateral: Secondary | ICD-10-CM | POA: Diagnosis not present

## 2020-08-08 DIAGNOSIS — H43812 Vitreous degeneration, left eye: Secondary | ICD-10-CM | POA: Diagnosis not present

## 2020-08-08 DIAGNOSIS — H10413 Chronic giant papillary conjunctivitis, bilateral: Secondary | ICD-10-CM | POA: Diagnosis not present

## 2020-08-08 DIAGNOSIS — H43391 Other vitreous opacities, right eye: Secondary | ICD-10-CM | POA: Diagnosis not present

## 2020-08-15 DIAGNOSIS — N952 Postmenopausal atrophic vaginitis: Secondary | ICD-10-CM | POA: Diagnosis not present

## 2020-08-15 DIAGNOSIS — N3000 Acute cystitis without hematuria: Secondary | ICD-10-CM | POA: Diagnosis not present

## 2020-08-15 DIAGNOSIS — R8271 Bacteriuria: Secondary | ICD-10-CM | POA: Diagnosis not present

## 2020-08-15 DIAGNOSIS — N3020 Other chronic cystitis without hematuria: Secondary | ICD-10-CM | POA: Diagnosis not present

## 2020-08-18 ENCOUNTER — Other Ambulatory Visit: Payer: Self-pay | Admitting: Family Medicine

## 2020-08-18 DIAGNOSIS — Z780 Asymptomatic menopausal state: Secondary | ICD-10-CM

## 2020-09-25 ENCOUNTER — Other Ambulatory Visit: Payer: Self-pay | Admitting: Gastroenterology

## 2020-10-09 DIAGNOSIS — N952 Postmenopausal atrophic vaginitis: Secondary | ICD-10-CM | POA: Diagnosis not present

## 2020-10-09 DIAGNOSIS — N3020 Other chronic cystitis without hematuria: Secondary | ICD-10-CM | POA: Diagnosis not present

## 2020-10-09 DIAGNOSIS — R102 Pelvic and perineal pain: Secondary | ICD-10-CM | POA: Diagnosis not present

## 2020-10-11 ENCOUNTER — Other Ambulatory Visit: Payer: Self-pay | Admitting: Family Medicine

## 2020-10-11 DIAGNOSIS — F411 Generalized anxiety disorder: Secondary | ICD-10-CM

## 2020-10-15 ENCOUNTER — Other Ambulatory Visit: Payer: Self-pay | Admitting: Family Medicine

## 2020-10-15 DIAGNOSIS — Z1231 Encounter for screening mammogram for malignant neoplasm of breast: Secondary | ICD-10-CM

## 2020-10-22 ENCOUNTER — Ambulatory Visit (INDEPENDENT_AMBULATORY_CARE_PROVIDER_SITE_OTHER): Payer: Medicare Other

## 2020-10-22 DIAGNOSIS — I442 Atrioventricular block, complete: Secondary | ICD-10-CM

## 2020-10-23 ENCOUNTER — Telehealth: Payer: Self-pay | Admitting: Internal Medicine

## 2020-10-23 LAB — CUP PACEART REMOTE DEVICE CHECK
Battery Remaining Longevity: 122 mo
Battery Remaining Percentage: 95.5 %
Battery Voltage: 3.02 V
Brady Statistic AP VP Percent: 6.2 %
Brady Statistic AP VS Percent: 1 %
Brady Statistic AS VP Percent: 91 %
Brady Statistic AS VS Percent: 1 %
Brady Statistic RA Percent Paced: 4.7 %
Brady Statistic RV Percent Paced: 98 %
Date Time Interrogation Session: 20220505114257
Implantable Lead Implant Date: 20210803
Implantable Lead Implant Date: 20210803
Implantable Lead Location: 753859
Implantable Lead Location: 753860
Implantable Pulse Generator Implant Date: 20210803
Lead Channel Impedance Value: 440 Ohm
Lead Channel Impedance Value: 450 Ohm
Lead Channel Pacing Threshold Amplitude: 0.5 V
Lead Channel Pacing Threshold Amplitude: 0.875 V
Lead Channel Pacing Threshold Pulse Width: 0.4 ms
Lead Channel Pacing Threshold Pulse Width: 0.5 ms
Lead Channel Sensing Intrinsic Amplitude: 10.4 mV
Lead Channel Sensing Intrinsic Amplitude: 2.9 mV
Lead Channel Setting Pacing Amplitude: 1.125
Lead Channel Setting Pacing Amplitude: 2 V
Lead Channel Setting Pacing Pulse Width: 0.5 ms
Lead Channel Setting Sensing Sensitivity: 4 mV
Pulse Gen Model: 2272
Pulse Gen Serial Number: 3852139

## 2020-10-23 NOTE — Telephone Encounter (Signed)
The patient needed help with her monitor. I called tech support. Transmission received.

## 2020-10-23 NOTE — Telephone Encounter (Signed)
Calling back to say that she miss home remote check but patient stated she didn't know she needed to do anything. Pease advise

## 2020-11-11 NOTE — Progress Notes (Signed)
Remote pacemaker transmission.   

## 2020-11-13 DIAGNOSIS — N3020 Other chronic cystitis without hematuria: Secondary | ICD-10-CM | POA: Diagnosis not present

## 2020-11-18 ENCOUNTER — Ambulatory Visit
Admission: RE | Admit: 2020-11-18 | Discharge: 2020-11-18 | Disposition: A | Discharge status: Home / Self Care | Payer: Medicare Other | Source: Ambulatory Visit | Attending: Family Medicine | Admitting: Family Medicine

## 2020-11-18 ENCOUNTER — Other Ambulatory Visit: Payer: Self-pay

## 2020-11-18 ENCOUNTER — Other Ambulatory Visit: Payer: Self-pay | Admitting: Family Medicine

## 2020-11-18 DIAGNOSIS — Z1231 Encounter for screening mammogram for malignant neoplasm of breast: Secondary | ICD-10-CM | POA: Diagnosis not present

## 2020-12-04 ENCOUNTER — Ambulatory Visit: Payer: Medicare Other | Admitting: Gastroenterology

## 2020-12-04 VITALS — BP 142/72 | HR 71 | Ht 61.0 in | Wt 138.5 lb

## 2020-12-04 DIAGNOSIS — K219 Gastro-esophageal reflux disease without esophagitis: Secondary | ICD-10-CM

## 2020-12-04 DIAGNOSIS — Z1211 Encounter for screening for malignant neoplasm of colon: Secondary | ICD-10-CM

## 2020-12-04 MED ORDER — NA SULFATE-K SULFATE-MG SULF 17.5-3.13-1.6 GM/177ML PO SOLN: ORAL | 0 refills | Status: DC

## 2020-12-04 MED ORDER — PEG-KCL-NACL-NASULF-NA ASC-C 100 G PO SOLR: 1.0000 | Freq: Once | ORAL | 0 refills | Status: AC

## 2020-12-04 MED ORDER — ESOMEPRAZOLE MAGNESIUM 40 MG PO CPDR: DELAYED_RELEASE_CAPSULE | ORAL | 3 refills | Status: DC

## 2020-12-04 NOTE — Patient Instructions (Signed)
You have been scheduled for a colonoscopy. Please follow written instructions given to you at your visit today.  Please pick up your prep supplies at the pharmacy within the next 1-3 days. If you use inhalers (even only as needed), please bring them with you on the day of your procedure.   Take Zofran as needed with colonoscopy prep  We will refill your Nexium  Conn's Current Therapy 2021 (pp. 213-216). [place], [place]: Elsevier.">  Gastroesophageal Reflux Disease, Adult Gastroesophageal reflux (GER) happens when acid from the stomach flows up into the tube that connects the mouth and the stomach (esophagus). Normally, food travels down the esophagus and stays in the stomach to be digested. However, when a person has GER, food and stomach acid sometimes move back up into the esophagus. If this becomes a more serious problem, the person may be diagnosed with a disease called gastroesophageal reflux disease (GERD). GERD occurs when the reflux: Happens often. Causes frequent or severe symptoms. Causes problems such as damage to the esophagus. When stomach acid comes in contact with the esophagus, the acid may cause inflammation in the esophagus. Over time, GERD may create small holes (ulcers) in the lining of the esophagus. What are the causes? This condition is caused by a problem with the muscle between the esophagus and the stomach (lower esophageal sphincter, or LES). Normally, the LES muscle closes after food passes through the esophagus to the stomach. When the LES is weakened or abnormal, it does not close properly, and that allows food and stomach acid to go back up into theesophagus. The LES can be weakened by certain dietary substances, medicines, and medical conditions, including: Tobacco use. Pregnancy. Having a hiatal hernia. Alcohol use. Certain foods and beverages, such as coffee, chocolate, onions, and peppermint. What increases the risk? You are more likely to develop this  condition if you: Have an increased body weight. Have a connective tissue disorder. Take NSAIDs, such as ibuprofen. What are the signs or symptoms? Symptoms of this condition include: Heartburn. Difficult or painful swallowing and the feeling of having a lump in the throat. A bitter taste in the mouth. Bad breath and having a large amount of saliva. Having an upset or bloated stomach and belching. Chest pain. Different conditions can cause chest pain. Make sure you see your health care provider if you experience chest pain. Shortness of breath or wheezing. Ongoing (chronic) cough or a nighttime cough. Wearing away of tooth enamel. Weight loss. How is this diagnosed? This condition may be diagnosed based on a medical history and a physical exam. To determine if you have mild or severe GERD, your health care provider may also monitor how you respond to treatment. You may also have tests, including: A test to examine your stomach and esophagus with a small camera (endoscopy). A test that measures the acidity level in your esophagus. A test that measures how much pressure is on your esophagus. A barium swallow or modified barium swallow test to show the shape, size, and functioning of your esophagus. How is this treated? Treatment for this condition may vary depending on how severe your symptoms are. Your health care provider may recommend: Changes to your diet. Medicine. Surgery. The goal of treatment is to help relieve your symptoms and to preventcomplications. Follow these instructions at home: Eating and drinking  Follow a diet as recommended by your health care provider. This may involve avoiding foods and drinks such as: Coffee and tea, with or without caffeine. Drinks that contain  alcohol. Energy drinks and sports drinks. Carbonated drinks or sodas. Chocolate and cocoa. Peppermint and mint flavorings. Garlic and onions. Horseradish. Spicy and acidic foods, including  peppers, chili powder, curry powder, vinegar, hot sauces, and barbecue sauce. Citrus fruit juices and citrus fruits, such as oranges, lemons, and limes. Tomato-based foods, such as red sauce, chili, salsa, and pizza with red sauce. Fried and fatty foods, such as donuts, french fries, potato chips, and high-fat dressings. High-fat meats, such as hot dogs and fatty cuts of red and white meats, such as rib eye steak, sausage, ham, and bacon. High-fat dairy items, such as whole milk, butter, and cream cheese. Eat small, frequent meals instead of large meals. Avoid drinking large amounts of liquid with your meals. Avoid eating meals during the 2-3 hours before bedtime. Avoid lying down right after you eat. Do not exercise right after you eat.  Lifestyle  Do not use any products that contain nicotine or tobacco. These products include cigarettes, chewing tobacco, and vaping devices, such as e-cigarettes. If you need help quitting, ask your health care provider. Try to reduce your stress by using methods such as yoga or meditation. If you need help reducing stress, ask your health care provider. If you are overweight, reduce your weight to an amount that is healthy for you. Ask your health care provider for guidance about a safe weight loss goal.  General instructions Pay attention to any changes in your symptoms. Take over-the-counter and prescription medicines only as told by your health care provider. Do not take aspirin, ibuprofen, or other NSAIDs unless your health care provider told you to take these medicines. Wear loose-fitting clothing. Do not wear anything tight around your waist that causes pressure on your abdomen. Raise (elevate) the head of your bed about 6 inches (15 cm). You can use a wedge to do this. Avoid bending over if this makes your symptoms worse. Keep all follow-up visits. This is important. Contact a health care provider if: You have: New symptoms. Unexplained weight  loss. Difficulty swallowing or it hurts to swallow. Wheezing or a persistent cough. A hoarse voice. Your symptoms do not improve with treatment. Get help right away if: You have sudden pain in your arms, neck, jaw, teeth, or back. You suddenly feel sweaty, dizzy, or light-headed. You have chest pain or shortness of breath. You vomit and the vomit is green, yellow, or black, or it looks like blood or coffee grounds. You faint. You have stool that is red, bloody, or black. You cannot swallow, drink, or eat. These symptoms may represent a serious problem that is an emergency. Do not wait to see if the symptoms will go away. Get medical help right away. Call your local emergency services (911 in the [place] Do not drive yourself to the hospital. Summary Gastroesophageal reflux happens when acid from the stomach flows up into the esophagus. GERD is a disease in which the reflux happens often, causes frequent or severe symptoms, or causes problems such as damage to the esophagus. Treatment for this condition may vary depending on how severe your symptoms are. Your health care provider may recommend diet and lifestyle changes, medicine, or surgery. Contact a health care provider if you have new or worsening symptoms. Take over-the-counter and prescription medicines only as told by your health care provider. Do not take aspirin, ibuprofen, or other NSAIDs unless your health care provider told you to do so. Keep all follow-up visits as told by your health care provider. This is  important. This information is not intended to replace advice given to you by your health care provider. Make sure you discuss any questions you have with your healthcare provider. Document Revised: 12/17/2019 Document Reviewed: 12/17/2019 Elsevier Patient Education  2022 [place].  Follow up in 1year  Due to recent changes in healthcare laws, you may see the results of your imaging and laboratory studies on MyChart  before your provider has had a chance to review them.  We understand that in some cases there may be results that are confusing or concerning to you. Not all laboratory results come back in the same time frame and the provider may be waiting for multiple results in order to interpret others.  Please give [place] 48 hours in order for your provider to thoroughly review all the results before contacting the office for clarification of your results.    If you are age 72 or older, your body mass index should be between 23-30. Your Body mass index is 26.17 kg/m. If this is out of the aforementioned range listed, please consider follow up with your Primary Care Provider.  If you are age 15 or younger, your body mass index should be between 19-25. Your Body mass index is 26.17 kg/m. If this is out of the aformentioned range listed, please consider follow up with your Primary Care Provider.   __________________________________________________________  The [facility] providers would like to encourage you to use [facility] to communicate with providers for non-urgent requests or questions.  Due to long hold times on the telephone, sending your provider a message by [facility] may be a faster and more efficient way to get a response.  Please allow 48 business hours for a response.  Please remember that this is for non-urgent requests.

## 2020-12-04 NOTE — Progress Notes (Signed)
Carmen Green    [id number]    [birth year]  Primary Care Physician:Gottschalk, Koleen Distance, DO  Referring Physician: [name], DO [place],  [place] [address]   Chief complaint:  GERD  HPI:  70 year old female with chronic GERD here for follow-up visit.  Last seen May 2019. She is doing overall well on Nexium daily and change in dietary habits along with lifestyle modifications. Denies any dysphagia, loss of appetite, weight loss, nausea, vomiting, abdominal pain, melena or bright red blood per rectum  She has occasional heartburn specially when she has heavy meals, she drinks diluted vinegar water and feels it helps with her symptoms.     EGD February 12, 2016 showed irregular Z line, wide open Schatzki's ring otherwise normal exam Colonoscopy December 04, 2010 severe sigmoid diverticulosis otherwise normal exam, recall in 10 years   Outpatient Encounter Medications as of 12/04/2020  Medication Sig   Ascorbic Acid (VITAMIN C) 1000 MG tablet Take 1,000 mg by mouth daily.   buPROPion (WELLBUTRIN XL) 300 MG 24 hr tablet Take 1 tablet (300 mg total) by mouth daily.   busPIRone (BUSPAR) 15 MG tablet TAKE  (1)  TABLET  THREE TIMES DAILY.   cetirizine (ZYRTEC) 10 MG tablet Take 10 mg by mouth daily as needed for allergies.   cholecalciferol (VITAMIN D) 25 MCG (1000 UNIT) tablet Take 1,000 Units by mouth daily.    D-MANNOSE PO Take 1 tablet by mouth daily.   esomeprazole (NEXIUM) 40 MG capsule TAKE 1 CAPSULE ONCE DAILY AT NOON   fluticasone (FLONASE) 50 MCG/ACT nasal spray Place 1 spray into both nostrils daily as needed for allergies.   guaiFENesin (MUCINEX) 600 MG 12 hr tablet Take 600 mg by mouth 2 (two) times daily as needed for cough.   lisinopril-hydrochlorothiazide (ZESTORETIC) 20-12.5 MG tablet Take 1 tablet by mouth daily. (NEEDS TO BE SEEN BEFORE NEXT REFILL)   LORazepam (ATIVAN) 1 MG tablet Take 1 tablet every night at bedtime.  May take 1/2 tablet  during daytime if needed.   meclizine (ANTIVERT) 25 MG tablet Take 1 tablet (25 mg total) by mouth 3 (three) times daily as needed for dizziness.   Multiple Vitamin (MULTIVITAMIN) tablet Take 1 tablet by mouth daily.     polyethylene glycol powder (GLYCOLAX/MIRALAX) powder Take 1 Container by mouth daily as needed for mild constipation.    trolamine salicylate (ASPERCREME) 10 % cream Apply 1 application topically as needed for muscle pain.   Zinc Sulfate (ZINC 15 PO) Take by mouth.   estradiol (ESTRACE) 0.1 MG/GM vaginal cream INSERT 1 GRAM VAGINALLY AT BEDTIME FOR 2 WEEKS, THEN TWICE A WEEK THEREAFTER   [DISCONTINUED] hydroxypropyl methylcellulose / hypromellose (ISOPTO TEARS / GONIOVISC) 2.5 % ophthalmic solution Place 1 drop into both eyes 2 (two) times daily as needed for dry eyes. (Patient not taking: Reported on 12/04/2020)   [DISCONTINUED] medroxyPROGESTERone (PROVERA) 2.5 MG tablet TAKE 1 TABLET DAILY (Patient not taking: Reported on 12/04/2020)   No facility-administered encounter medications on file as of 12/04/2020.    Allergies as of 12/04/2020 - Review Complete 12/04/2020  Allergen Reaction Noted   Zoloft [sertraline hcl] Nausea Only 05/14/2019   Amoxapine and related Rash 07/11/2015   Cephalexin Rash 10/10/2007   Oxycodone  12/05/2013   Penicillins Rash 10/10/2007    Past Medical History:  Diagnosis Date   Allergy    Anemia    Anxiety    Arthritis  Cataract    bil cataracts removed   Colon polyp    Complete heart block (HCC)    Contusion of chest wall 10/10/2007   Centricity Description: CHEST WALL CONTUSION Qualifier: Diagnosis of  By: Aline Brochure MD, Dorothyann Peng   Centricity Description: CONTUSION, RIGHT RIB Qualifier: Diagnosis of  By: Aline Brochure MD, Stanley     Depression    Diverticulitis    Esophagitis    ESOPHAGITIS 06/25/2008   Qualifier: History of  By: Harlon Ditty CMA (AAMA), Dottie     GERD (gastroesophageal reflux disease)    H/O seasonal allergies    Heart  murmur    Hiatal hernia    Hyperlipidemia    s/p PPM implant by Dr Olevia Perches 1992 with generator change 2004 ([facility]), she is device dependant   Hypertension    Mitral valve prolapse    Pacemaker     Past Surgical History:  Procedure Laterality Date   APPENDECTOMY     BREAST EXCISIONAL BIOPSY Right 07/08/1998   BREAST SURGERY Right    biopsy   COLONOSCOPY  10/19/2004   [place]   EYE SURGERY Left    cataract extraction with IOL   LAPAROSCOPIC LOW ANTERIOR RESECTION  11/16/2013   Robotic   LAPAROSCOPIC LYSIS OF ADHESIONS N/A 11/15/2013   Procedure: LAPAROSCOPIC LYSIS OF ADHESIONS;  Surgeon: Adin Hector, MD;  Location: [facility];  Service: General;  Laterality: N/A;   left ovary cyst removal  [place] 01/22/2020   Procedure: PACEMAKER IMPLANT;  Surgeon: Thompson Grayer, MD;  Location: [facility] [facility];  Service: Cardiovascular;  Laterality: N/A;   PACEMAKER INSERTION  1992, 2004, 2012   device dependant   POLYPECTOMY     PROCTOSCOPY  11/15/2013   Procedure: RIGID PROCTOSCOPY;  Surgeon: Adin Hector, MD;  Location: [facility];  Service: General;;   ROBOTIC ASSISTED SALPINGO OOPHERECTOMY Left 11/16/2013   Robotic en bloc w LAR resection   TUBAL LIGATION     UPPER GASTROINTESTINAL ENDOSCOPY      Family History  Problem Relation Age of Onset   Diabetes Mother    Stroke Mother    Diabetes Father    Lung cancer Father    Lung cancer Maternal Grandfather    Heart disease Maternal Aunt    Heart disease Maternal Grandmother    Breast cancer Maternal Aunt    Colon cancer Neg Hx    Esophageal cancer Neg Hx    Pancreatic cancer Neg Hx    Rectal cancer Neg Hx    Stomach cancer Neg Hx     Social History   Socioeconomic History   Marital status: Married    Spouse name: Not on file   Number of children: 1   Years of education: Not on file   Highest education level: Not on file  Occupational History   Occupation: REGISTRATION  Tobacco Use   Smoking status: Never    Smokeless tobacco: Never  Vaping Use   Vaping Use: Never used  Substance and Sexual Activity   Alcohol use: No   Drug use: No   Sexual activity: Not on file  Other Topics Concern   Not on file  Social History Narrative   Lives with spouse in [place]   Works for an orthopoedic office in [place]         Social Determinants of Health   Financial Resource Strain: Not on file  Food Insecurity: Not on file  Transportation Needs: Not on file  Physical Activity:  Not on file  Stress: Not on file  Social Connections: Not on file  Intimate Partner Violence: Not on file      Review of systems: All other review of systems negative except as mentioned in the HPI.   Physical Exam: Vitals:   12/04/20 1028  BP: (!) 142/72  Pulse: 71  SpO2: 99%   Body mass index is 26.17 kg/m. Gen:      No acute distress HEENT:  sclera anicteric Abd:      soft, non-tender; no palpable masses, no distension Ext:    No edema Neuro: alert and oriented x 3 Psych: normal mood and affect  Data Reviewed:  Reviewed labs, radiology imaging, old records and pertinent past GI work up   Assessment and Plan/Recommendations:  70 year old very pleasant female with history of cardiomyopathy, hiatal hernia, hypertension here for follow-up visit for chronic GERD GERD: Continue Nexium 40 mg daily Discussed antireflux measures Avoid NSAIDs  Due for colonoscopy for colorectal cancer screening, will schedule it The risks and benefits as well as alternatives of endoscopic procedure(s) have been discussed and reviewed. All questions answered. The patient agrees to proceed. Advised patient to use Zofran as needed with colonoscopy prep to prevent nausea or vomiting.  Follow-up in 1-2 years or sooner if needed  The patient was provided an opportunity to ask questions and all were answered. The patient agreed with the plan and demonstrated an understanding of the instructions.  Damaris Hippo , MD     CC: [name], DO

## 2020-12-15 ENCOUNTER — Telehealth: Payer: Self-pay | Admitting: Gastroenterology

## 2020-12-15 MED ORDER — ONDANSETRON HCL 4 MG PO TABS: ORAL_TABLET | ORAL | 0 refills | Status: DC

## 2020-12-15 NOTE — Telephone Encounter (Signed)
Sent 4 tablets in of Zofran

## 2020-12-15 NOTE — Telephone Encounter (Signed)
Inbound call from patient stating pharmacy did not receive Zofran prescription.  Please advise.

## 2020-12-16 ENCOUNTER — Encounter: Payer: Self-pay | Admitting: Gastroenterology

## 2020-12-19 DIAGNOSIS — R69 Illness, unspecified: Secondary | ICD-10-CM | POA: Diagnosis not present

## 2020-12-20 ENCOUNTER — Other Ambulatory Visit: Payer: Self-pay | Admitting: Family Medicine

## 2020-12-24 ENCOUNTER — Other Ambulatory Visit: Payer: Self-pay | Admitting: Family Medicine

## 2020-12-24 DIAGNOSIS — F411 Generalized anxiety disorder: Secondary | ICD-10-CM

## 2021-01-05 ENCOUNTER — Ambulatory Visit (INDEPENDENT_AMBULATORY_CARE_PROVIDER_SITE_OTHER): Payer: Medicare Other | Admitting: Family Medicine

## 2021-01-05 ENCOUNTER — Encounter: Payer: Self-pay | Admitting: Family Medicine

## 2021-01-05 VITALS — BP 138/71 | HR 72 | Temp 98.3°F | Ht 61.0 in | Wt 140.6 lb

## 2021-01-05 DIAGNOSIS — I10 Essential (primary) hypertension: Secondary | ICD-10-CM

## 2021-01-05 DIAGNOSIS — F410 Panic disorder [episodic paroxysmal anxiety] without agoraphobia: Secondary | ICD-10-CM

## 2021-01-05 DIAGNOSIS — J301 Allergic rhinitis due to pollen: Secondary | ICD-10-CM

## 2021-01-05 DIAGNOSIS — F411 Generalized anxiety disorder: Secondary | ICD-10-CM

## 2021-01-05 DIAGNOSIS — Z79899 Other long term (current) drug therapy: Secondary | ICD-10-CM | POA: Diagnosis not present

## 2021-01-05 DIAGNOSIS — Z87440 Personal history of urinary (tract) infections: Secondary | ICD-10-CM

## 2021-01-05 LAB — URINALYSIS, ROUTINE W REFLEX MICROSCOPIC
Bilirubin, UA: NEGATIVE
Glucose, UA: NEGATIVE
Ketones, UA: NEGATIVE
Leukocytes,UA: NEGATIVE
Nitrite, UA: NEGATIVE
Protein,UA: NEGATIVE
Specific Gravity, UA: 1.015 (ref 1.005–1.030)
Urobilinogen, Ur: 0.2 mg/dL (ref 0.2–1.0)
pH, UA: 7 (ref 5.0–7.5)

## 2021-01-05 LAB — MICROSCOPIC EXAMINATION
Bacteria, UA: NONE SEEN
Epithelial Cells (non renal): NONE SEEN /hpf (ref 0–10)
WBC, UA: NONE SEEN /hpf (ref 0–5)

## 2021-01-05 MED ORDER — BUSPIRONE HCL 15 MG PO TABS: ORAL_TABLET | ORAL | 3 refills | Status: DC

## 2021-01-05 MED ORDER — LORAZEPAM 1 MG PO TABS: ORAL_TABLET | ORAL | 1 refills | Status: DC

## 2021-01-05 MED ORDER — LISINOPRIL-HYDROCHLOROTHIAZIDE 20-12.5 MG PO TABS: ORAL_TABLET | ORAL | 3 refills | Status: DC

## 2021-01-05 MED ORDER — FLUTICASONE PROPIONATE 50 MCG/ACT NA SUSP: 1.0000 | Freq: Every day | NASAL | 3 refills | Status: DC | PRN

## 2021-01-05 MED ORDER — BUPROPION HCL ER (XL) 300 MG PO TB24: 300.0000 mg | ORAL_TABLET | Freq: Every day | ORAL | 3 refills | Status: DC

## 2021-01-05 NOTE — Progress Notes (Signed)
Subjective: CC: Follow-up anxiety disorder PCP: [name], DO JJH:ERDEYC Carmen Green is a 70 y.o. female presenting to clinic today for:  1.  Generalized anxiety disorder panic attack Patient is chronically treated with BuSpar 15 mg 3 times daily, Wellbutrin extended release 300 mg daily and Ativan 1/2 tablet in the morning with one tablet each evening.  Both times her anxiety is stable with this regimen but she admits recently she became extremely overwhelmed.  Both her daughter and husband depend on her for care.  She notes that sometimes she is overwhelmed by this and feels guilty about that.  She denies any excessive daytime sedation, falls, respiratory depression, visual or auditory hallucinations with the Ativan.  She really tries to use it as sparingly as possible but often finds daily need for it.  2.  Allergic rhinitis Patient reports stability of this but does need a renewal on her Flonase.  3.  Hypertension Patient is compliant with antihypertensives.  No chest pain, shortness of breath.  She is tolerating physical activity without difficulty.  ROS: Per HPI  Allergies  Allergen Reactions   Zoloft [Sertraline Hcl] Nausea Only        Amoxapine And Related Rash   Cephalexin Rash   Oxycodone     "Loopy"   Penicillins Rash   Past Medical History:  Diagnosis Date   Allergy    Anemia    Anxiety    Arthritis    Cataract    bil cataracts removed   Colon polyp    Complete heart block (HCC)    Contusion of chest wall 10/10/2007   Centricity Description: CHEST WALL CONTUSION Qualifier: Diagnosis of  By: Aline Brochure MD, Dorothyann Peng   Centricity Description: CONTUSION, RIGHT RIB Qualifier: Diagnosis of  By: Aline Brochure MD, Stanley     Depression    Diverticulitis    Esophagitis    ESOPHAGITIS 06/25/2008   Qualifier: History of  By: Harlon Ditty CMA (AAMA), Dottie     GERD (gastroesophageal reflux disease)    H/O seasonal allergies    Heart murmur    Hiatal hernia     Hyperlipidemia    s/p PPM implant by Dr Olevia Perches 1992 with generator change 2004 ([facility]), she is device dependant   Hypertension    Mitral valve prolapse    Pacemaker     Current Outpatient Medications:    Ascorbic Acid (VITAMIN C) 1000 MG tablet, Take 1,000 mg by mouth daily., Disp: , Rfl:    buPROPion (WELLBUTRIN XL) 300 MG 24 hr tablet, TAKE 1 TABLET ONCE DAILY, Disp: 30 tablet, Rfl: 0   busPIRone (BUSPAR) 15 MG tablet, TAKE  (1)  TABLET  THREE TIMES DAILY., Disp: 270 tablet, Rfl: 0   cetirizine (ZYRTEC) 10 MG tablet, Take 10 mg by mouth daily as needed for allergies., Disp: , Rfl:    cholecalciferol (VITAMIN D) 25 MCG (1000 UNIT) tablet, Take 1,000 Units by mouth daily. , Disp: , Rfl:    D-MANNOSE PO, Take 1 tablet by mouth daily., Disp: , Rfl:    esomeprazole (NEXIUM) 40 MG capsule, TAKE 1 CAPSULE ONCE DAILY AT NOON, Disp: 90 capsule, Rfl: 3   estradiol (ESTRACE) 0.1 MG/GM vaginal cream, INSERT 1 GRAM VAGINALLY AT BEDTIME FOR 2 WEEKS, THEN TWICE A WEEK THEREAFTER, Disp: , Rfl:    fluticasone (FLONASE) 50 MCG/ACT nasal spray, Place 1 spray into both nostrils daily as needed for allergies., Disp: 48 g, Rfl: 3   guaiFENesin (MUCINEX) 600 MG 12 hr tablet, Take  600 mg by mouth 2 (two) times daily as needed for cough., Disp: , Rfl:    lisinopril-hydrochlorothiazide (ZESTORETIC) 20-12.5 MG tablet, TAKE (1) TABLET DAILY IN THE MORNING., Disp: 30 tablet, Rfl: 0   LORazepam (ATIVAN) 1 MG tablet, Take 1 tablet every night at bedtime.  May take 1/2 tablet during daytime if needed., Disp: 135 tablet, Rfl: 1   meclizine (ANTIVERT) 25 MG tablet, Take 1 tablet (25 mg total) by mouth 3 (three) times daily as needed for dizziness., Disp: 30 tablet, Rfl: 0   Multiple Vitamin (MULTIVITAMIN) tablet, Take 1 tablet by mouth daily.  , Disp: , Rfl:    Na Sulfate-K Sulfate-Mg Sulf 17.5-3.13-1.6 GM/177ML SOLN, As per Colon prep instructions, Disp: 354 mL, Rfl: 0   ondansetron (ZOFRAN) 4 MG tablet, To use as directed  with colonoscopy prep,  1 30 minutes before the start of the prep and 1 30 minutes before the second part of her prep   the other two use as needed, Disp: 4 tablet, Rfl: 0   polyethylene glycol powder (GLYCOLAX/MIRALAX) powder, Take 1 Container by mouth daily as needed for mild constipation. , Disp: , Rfl:    trolamine salicylate (ASPERCREME) 10 % cream, Apply 1 application topically as needed for muscle pain., Disp: , Rfl:    Zinc Sulfate (ZINC 15 PO), Take by mouth., Disp: , Rfl:  Social History   Socioeconomic History   Marital status: Married    Spouse name: Not on file   Number of children: 1   Years of education: Not on file   Highest education level: Not on file  Occupational History   Occupation: REGISTRATION  Tobacco Use   Smoking status: Never   Smokeless tobacco: Never  Vaping Use   Vaping Use: Never used  Substance and Sexual Activity   Alcohol use: No   Drug use: No   Sexual activity: Not on file  Other Topics Concern   Not on file  Social History Narrative   Lives with spouse in [place]   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 Strain: Not on file  Food Insecurity: Not on file  Transportation Needs: Not on file  Physical Activity: Not on file  Stress: Not on file  Social Connections: Not on file  Intimate Partner Violence: Not on file   Family History  Problem Relation Age of Onset   Diabetes Mother    Stroke Mother    Diabetes Father    Lung cancer Father    Lung cancer Maternal Grandfather    Heart disease Maternal Aunt    Heart disease Maternal Grandmother    Breast cancer Maternal Aunt    Colon cancer Neg Hx    Esophageal cancer Neg Hx    Pancreatic cancer Neg Hx    Rectal cancer Neg Hx    Stomach cancer Neg Hx     Objective: Office vital signs reviewed. BP 138/71   Pulse 72   Temp 98.3 F (36.8 C)   Ht 5\' 1"  (1.549 m)   Wt 140 lb 9.6 oz (63.8 kg)   SpO2 97%   BMI 26.57  kg/m   Physical Examination:  General: Awake, alert, well nourished, No acute distress HEENT: Normal; sclera white Cardio: regular rate and rhythm, S1S2 heard Pulm: clear to auscultation bilaterally, no wheezes, rhonchi or rales; normal work of breathing on room air Psych: Mood is stable.  Patient  is pleasant and interactive  Depression screen [facility] 2/9 01/05/2021 07/15/2020 05/02/2020  Decreased Interest 0 0 0  Down, Depressed, Hopeless 0 0 0  PHQ - 2 Score 0 0 0  Altered sleeping - - -  Tired, decreased energy - - -  Change in appetite - - -  Feeling bad or failure about yourself  - - -  Trouble concentrating - - -  Moving slowly or fidgety/restless - - -  Suicidal thoughts - - -  PHQ-9 Score - - -  Some recent data might be hidden   GAD 7 : Generalized Anxiety Score 01/05/2021 07/15/2020 12/04/2019 05/20/2019  Nervous, Anxious, on Edge 0 1 2 2   Control/stop worrying 0 2 3 2   Worry too much - different things 0 2 3 2   Trouble relaxing 0 3 3 1   Restless 0 1 1 1   Easily annoyed or irritable 0 1 1 2   Afraid - awful might happen 0 1 2 2   Total GAD 7 Score 0 11 15 12   Anxiety Difficulty Not difficult at all Somewhat difficult Very difficult Somewhat difficult   Assessment/ Plan: 70 y.o. female   Generalized anxiety disorder with panic attacks - Plan: LORazepam (ATIVAN) 1 MG tablet, buPROPion (WELLBUTRIN XL) 300 MG 24 hr tablet, busPIRone (BUSPAR) 15 MG tablet  Chronic prescription benzodiazepine use - Plan: ToxASSURE Select 13 (MW), Urine  Controlled substance agreement signed - Plan: ToxASSURE Select 13 (MW), Urine  Essential hypertension - Plan: lisinopril-hydrochlorothiazide (ZESTORETIC) 20-12.5 MG tablet  History of frequent urinary tract infections - Plan: Urinalysis, Routine w reflex microscopic  Allergic rhinitis due to pollen, unspecified seasonality - Plan: fluticasone (FLONASE) 50 MCG/ACT nasal spray  Intermittent exacerbation of anxiety disorder.  I offered to  advance buspirone to 20 mg 3 times daily but she would like to hold off on this for now.  She does feel like she has good support by friends and is able to vent to them occasionally.  She is not having any complications from her current medication regimen.  The UDS and CSC were updated as per office policy national narcotic database demonstrated no red flags.  Medications have been renewed  Blood pressure under good control.  Continue current regimen  Flonase renewed for allergic rhinitis  Given history of frequent urinary tract infection she requested that UA be collected along with her urine drug screen today  A follow-up in 6 months for full physical exam and renewal of medication, sooner if needed  No orders of the defined types were placed in this encounter.  No orders of the defined types were placed in this encounter.    [name], DO [place] [phone]

## 2021-01-07 ENCOUNTER — Ambulatory Visit (INDEPENDENT_AMBULATORY_CARE_PROVIDER_SITE_OTHER): Payer: Medicare Other | Admitting: *Deleted

## 2021-01-07 DIAGNOSIS — Z0000 Encounter for general adult medical examination without abnormal findings: Secondary | ICD-10-CM | POA: Diagnosis not present

## 2021-01-07 NOTE — Patient Instructions (Signed)
  Cut Off Maintenance Summary and Written Plan of Care  Ms. Carmen Green ,  Thank you for allowing me to perform your Medicare Annual Wellness Visit and for your ongoing commitment to your health.   Health Maintenance & Immunization History Health Maintenance  Topic Date Due   COVID-19 Vaccine (4 - Booster for Moderna series) 01/21/2021 (Originally 10/13/2020)   Zoster Vaccines- Shingrix (2 of 2) 04/07/2021 (Originally 01/29/2020)   TETANUS/TDAP  07/15/2021 (Originally 09/29/1969)   Hepatitis C Screening  07/15/2021 (Originally 09/29/1968)   COLONOSCOPY (Pts 45-43yrs Insurance coverage will need to be confirmed)  01/05/2022 (Originally 12/03/2020)   INFLUENZA VACCINE  01/19/2021   MAMMOGRAM  11/19/2022   DEXA SCAN  Completed   PNA vac Low Risk Adult  Completed   HPV VACCINES  Aged Out   Immunization History  Administered Date(s) Administered   Fluad Quad(high Dose 65+) 07/22/2020   Influenza, High Dose Seasonal PF 05/05/2016, 05/19/2017, 04/26/2018   Influenza,inj,Quad PF,6+ Mos 05/02/2019   Influenza-Unspecified 05/05/2016, 05/19/2017, 04/26/2018   Moderna Sars-Covid-2 Vaccination 08/20/2019, 09/17/2019, 07/15/2020   Pneumococcal Conjugate-13 01/03/2015   Pneumococcal Polysaccharide-23 12/21/2016   Zoster Recombinat (Shingrix) 12/04/2019    These are the patient goals that we discussed:  Goals Addressed             This Visit's Progress    AWV       01/07/2021 AWV Goal: Exercise for General Health  Patient will verbalize understanding of the benefits of increased physical activity: Exercising regularly is important. It will improve your overall fitness, flexibility, and endurance. Regular exercise also will improve your overall health. It can help you control your weight, reduce stress, and improve your bone density. Over the next year, patient will increase physical activity as tolerated with a goal of at least 150 minutes of moderate physical  activity per week.  You can tell that you are exercising at a moderate intensity if your heart starts beating faster and you start breathing faster but can still hold a conversation. Moderate-intensity exercise ideas include: Walking 1 mile (1.6 km) in about 15 minutes Biking Hiking Golfing Dancing Water aerobics Patient will verbalize understanding of everyday activities that increase physical activity by providing examples like the following: Yard work, such as: Sales promotion account executive Gardening Washing windows or floors Patient will be able to explain general safety guidelines for exercising:  Before you start a new exercise program, talk with your health care provider. Do not exercise so much that you hurt yourself, feel dizzy, or get very short of breath. Wear comfortable clothes and wear shoes with good support. Drink plenty of water while you exercise to prevent dehydration or heat stroke. Work out until your breathing and your heartbeat get faster.          This is a list of Health Maintenance Items that are overdue or due now: There are no preventive care reminders to display for this patient.   Orders/Referrals Placed Today: No orders of the defined types were placed in this encounter.  (Contact our referral department at [phone] if you have not spoken with someone about your referral appointment within the next 5 days)    Follow-up Plan Follow-up with [name], DO as planned

## 2021-01-07 NOTE — Progress Notes (Signed)
MEDICARE ANNUAL WELLNESS VISIT  01/07/2021  Telephone Visit Disclaimer This Medicare AWV was conducted by telephone due to national recommendations for restrictions regarding the COVID-19 Pandemic (e.g. social distancing).  I verified, using two identifiers, that I am speaking with Carmen Green or their authorized healthcare agent. I discussed the limitations, risks, security, and privacy concerns of performing an evaluation and management service by telephone and the potential availability of an in-person appointment in the future. The patient expressed understanding and agreed to proceed.  Location of Patient: Home  Location of Provider (nurse):  [facility]  Subjective:    Carmen Green is a 70 y.o. female patient of Carmen Norlander, DO who had a Medicare Annual Wellness Visit today via telephone. Carmen Green is Retired and lives with their family. she has 1 children. she reports that she is socially active and does interact with friends/family regularly. she is not physically active and enjoys reading, going to church, and working in the yard.  Patient Care Team: Carmen Norlander, DO as PCP - General (Family Medicine) Altamese Dilling, RN as Registered Nurse Olevia Perches, Lowella Bandy, MD (Inactive) as Consulting Physician (Gastroenterology) Michael Boston, MD as Consulting Physician (General Surgery) Larey Dresser, MD as Consulting Physician (Cardiology)  Advanced Directives 01/07/2021 01/22/2020 10/25/2017 12/21/2016 11/15/2013 11/09/2013  Does Patient Have a Medical Advance Directive? Yes Yes No Yes Patient has advance directive, copy not in chart Patient has advance directive, copy not in chart  Type of Advance Directive [place];Living will [place];Living will - [place];Living will [place];Living will [place];Living will  Does patient want to make changes to  medical advance directive? - - - - No change requested -  Copy of [place] in Chart? Yes - validated most recent copy scanned in chart (See row information) - - Yes - Copy requested from family  Pre-existing out of facility DNR order (yellow form or pink MOST form) - - - - No -    Hospital Utilization Over the Past 12 Months: # of hospitalizations or ER visits: 0 # of surgeries: 1 - Pacemaker replaced  Review of Systems    Patient reports that her overall health is unchanged compared to last year.  No complaints or concerns   Patient Reported Readings (BP, Pulse, CBG, Weight, etc) none  Pain Assessment Pain : No/denies pain     Current Medications & Allergies (verified) Allergies as of 01/07/2021       Reactions   Zoloft [sertraline Hcl] Nausea Only      Amoxapine And Related Rash   Cephalexin Rash   Oxycodone    "Loopy"   Penicillins Rash        Medication List        Accurate as of January 07, 2021  9:44 AM. If you have any questions, ask your nurse or doctor.          buPROPion 300 MG 24 hr tablet Commonly known as: WELLBUTRIN XL Take 1 tablet (300 mg total) by mouth daily.   busPIRone 15 MG tablet Commonly known as: BUSPAR TAKE  (1)  TABLET  THREE TIMES DAILY.   cetirizine 10 MG tablet Commonly known as: ZYRTEC Take 10 mg by mouth daily as needed for allergies.   cholecalciferol 25 MCG (1000 UNIT) tablet Commonly known as: VITAMIN D Take 1,000 Units by mouth daily.   D-MANNOSE PO Take 1  tablet by mouth daily.   esomeprazole 40 MG capsule Commonly known as: NEXIUM TAKE 1 CAPSULE ONCE DAILY AT NOON   estradiol 0.1 MG/GM vaginal cream Commonly known as: ESTRACE INSERT 1 GRAM VAGINALLY AT BEDTIME FOR 2 WEEKS, THEN TWICE A WEEK THEREAFTER   fluticasone 50 MCG/ACT nasal spray Commonly known as: FLONASE Place 1 spray into both nostrils daily as needed for allergies.   guaiFENesin 600 MG 12 hr tablet Commonly known as:  MUCINEX Take 600 mg by mouth 2 (two) times daily as needed for cough.   lisinopril-hydrochlorothiazide 20-12.5 MG tablet Commonly known as: ZESTORETIC TAKE (1) TABLET DAILY IN THE MORNING.   LORazepam 1 MG tablet Commonly known as: ATIVAN Take 1 tablet every night at bedtime.  May take 1/2 tablet during daytime if needed.   meclizine 25 MG tablet Commonly known as: ANTIVERT Take 1 tablet (25 mg total) by mouth 3 (three) times daily as needed for dizziness.   multivitamin tablet Take 1 tablet by mouth daily.   Na Sulfate-K Sulfate-Mg Sulf 17.5-3.13-1.6 GM/177ML Soln As per Colon prep instructions   ondansetron 4 MG tablet Commonly known as: Zofran To use as directed with colonoscopy prep,  1 30 minutes before the start of the prep and 1 30 minutes before the second part of her prep   the other two use as needed   polyethylene glycol powder 17 GM/SCOOP powder Commonly known as: GLYCOLAX/MIRALAX Take 1 Container by mouth daily as needed for mild constipation.   trolamine salicylate 10 % cream Commonly known as: ASPERCREME Apply 1 application topically as needed for muscle pain.   vitamin C 1000 MG tablet Take 1,000 mg by mouth daily.   ZINC 15 PO Take by mouth.        History (reviewed): Past Medical History:  Diagnosis Date   Allergy    Anemia    Anxiety    Arthritis    Cataract    bil cataracts removed   Colon polyp    Complete heart block (HCC)    Contusion of chest wall 10/10/2007   Centricity Description: CHEST WALL CONTUSION Qualifier: Diagnosis of  By: Aline Brochure MD, Dorothyann Peng   Centricity Description: CONTUSION, RIGHT RIB Qualifier: Diagnosis of  By: Aline Brochure MD, Stanley     Depression    Diverticulitis    Esophagitis    ESOPHAGITIS 06/25/2008   Qualifier: History of  By: Harlon Ditty CMA (AAMA), Dottie     GERD (gastroesophageal reflux disease)    H/O seasonal allergies    Heart murmur    Hiatal hernia    Hyperlipidemia    s/p PPM implant by Dr Olevia Perches  1992 with generator change 2004 ([facility]), she is device dependant   Hypertension    Mitral valve prolapse    Pacemaker    Past Surgical History:  Procedure Laterality Date   APPENDECTOMY     BREAST EXCISIONAL BIOPSY Right 07/08/1998   BREAST SURGERY Right    biopsy   COLONOSCOPY  10/19/2004   [place]   EYE SURGERY Left    cataract extraction with IOL   LAPAROSCOPIC LOW ANTERIOR RESECTION  11/16/2013   Robotic   LAPAROSCOPIC LYSIS OF ADHESIONS N/A 11/15/2013   Procedure: LAPAROSCOPIC LYSIS OF ADHESIONS;  Surgeon: Adin Hector, MD;  Location: [facility];  Service: General;  Laterality: N/A;   left ovary cyst removal  [place] 01/22/2020   Procedure: PACEMAKER IMPLANT;  Surgeon: Thompson Grayer, MD;  Location: [facility];  Service: Cardiovascular;  Laterality: N/A;   PACEMAKER INSERTION  1992, 2004, 2012   device dependant   POLYPECTOMY     PROCTOSCOPY  11/15/2013   Procedure: RIGID PROCTOSCOPY;  Surgeon: Adin Hector, MD;  Location: [facility];  Service: General;;   ROBOTIC ASSISTED SALPINGO OOPHERECTOMY Left 11/16/2013   Robotic en bloc w LAR resection   TUBAL LIGATION     UPPER GASTROINTESTINAL ENDOSCOPY     Family History  Problem Relation Age of Onset   Diabetes Mother    Stroke Mother    Diabetes Father    Lung cancer Father    Heart disease Maternal Aunt    Breast cancer Maternal Aunt    Heart disease Maternal Grandmother    Lung cancer Maternal Grandfather    Colon cancer Neg Hx    Esophageal cancer Neg Hx    Pancreatic cancer Neg Hx    Rectal cancer Neg Hx    Stomach cancer Neg Hx    Social History   Socioeconomic History   Marital status: Married    Spouse name: Not on file   Number of children: 1   Years of education: Not on file   Highest education level: Not on file  Occupational History   Occupation: REGISTRATION   Occupation: retired  Tobacco Use   Smoking status: Never   Smokeless tobacco: Never  Vaping Use   Vaping Use: Never used   Substance and Sexual Activity   Alcohol use: No   Drug use: No   Sexual activity: Not on file  Other Topics Concern   Not on file  Social History Narrative   Lives with spouse in [place]   Social Determinants of Health   Financial Resource Strain: Not on file  Food Insecurity: Not on file  Transportation Needs: Not on file  Physical Activity: Not on file  Stress: Not on file  Social Connections: Not on file    Activities of Daily Living In your present state of health, do you have any difficulty performing the following activities: 01/07/2021  Hearing? Y  Comment Supposed to wear hearing aids  Vision? N  Comment Wears glasses  Difficulty concentrating or making decisions? N  Walking or climbing stairs? N  Dressing or bathing? N  Doing errands, shopping? N  Preparing Food and eating ? N  Using the Toilet? N  In the past six months, have you accidently leaked urine? Y  Do you have problems with loss of bowel control? N  Managing your Medications? N  Managing your Finances? N  Housekeeping or managing your Housekeeping? N  Some recent data might be hidden    Patient Education/ Literacy How often do you need to have someone help you when you read instructions, pamphlets, or other written materials from your doctor or pharmacy?: 1 - Never What is the last grade level you completed in school?: 12th  Exercise Current Exercise Habits: The patient does not participate in regular exercise at present  Diet Patient reports consuming 2 meals a day and 1 snack(s) a day Patient reports that her primary diet is: Regular Patient reports that she does have regular access to food.   Depression Screen PHQ 2/9 Scores 01/07/2021 01/05/2021 07/15/2020 05/02/2020 12/04/2019 09/02/2018 06/27/2018  PHQ - 2 Score 0 0 0 0 1 0 1  PHQ- 9 Score - - - - 3 - -     Fall Risk Fall Risk  01/07/2021 01/05/2021 05/02/2020 12/04/2019 09/02/2018  Falls in the past year?  0 0 0 0 0  Number falls in past yr:  0 - - - -  Injury with Fall? 0 - - - -  Risk for fall due to : No Fall Risks - - - -  Follow up Falls prevention discussed - Falls evaluation completed - -     Objective:  [facility] seemed alert and oriented and she participated appropriately during our telephone visit.  Blood Pressure Weight BMI  BP Readings from Last 3 Encounters:  01/05/21 138/71  12/04/20 (!) 142/72  07/15/20 126/68   Wt Readings from Last 3 Encounters:  01/05/21 140 lb 9.6 oz (63.8 kg)  12/04/20 138 lb 8 oz (62.8 kg)  07/15/20 141 lb (64 kg)   BMI Readings from Last 1 Encounters:  01/05/21 26.57 kg/m    *Unable to obtain current vital signs, weight, and BMI due to telephone visit type  Hearing/Vision  Munira did not seem to have difficulty with hearing/understanding during the telephone conversation Reports that she has had a formal eye exam by an eye care professional within the past year Reports that she has not had a formal hearing evaluation within the past year *Unable to fully assess hearing and vision during telephone visit type  Cognitive Function: 6CIT Screen 01/07/2021  What Year? 0 points  What month? 0 points  What time? 0 points  Count back from 20 0 points  Months in reverse 0 points  Repeat phrase 0 points  Total Score 0   (Normal:0-7, Significant for Dysfunction: >8)  Normal Cognitive Function Screening: Yes   Immunization & Health Maintenance Record Immunization History  Administered Date(s) Administered   Fluad Quad(high Dose 65+) 07/22/2020   Influenza, High Dose Seasonal PF 05/05/2016, 05/19/2017, 04/26/2018   Influenza,inj,Quad PF,6+ Mos 05/02/2019   Influenza-Unspecified 05/05/2016, 05/19/2017, 04/26/2018   Moderna Sars-Covid-2 Vaccination 08/20/2019, 09/17/2019, 07/15/2020   Pneumococcal Conjugate-13 01/03/2015   Pneumococcal Polysaccharide-23 12/21/2016   Zoster Recombinat (Shingrix) 12/04/2019    Health Maintenance  Topic Date Due   COVID-19 Vaccine (4 -  Booster for Moderna series) 01/21/2021 (Originally 10/13/2020)   Zoster Vaccines- Shingrix (2 of 2) 04/07/2021 (Originally 01/29/2020)   TETANUS/TDAP  07/15/2021 (Originally 09/29/1969)   Hepatitis C Screening  07/15/2021 (Originally 09/29/1968)   COLONOSCOPY (Pts 45-63yrs Insurance coverage will need to be confirmed)  01/05/2022 (Originally 12/03/2020)   INFLUENZA VACCINE  01/19/2021   MAMMOGRAM  11/19/2022   DEXA SCAN  Completed   PNA vac Low Risk Adult  Completed   HPV VACCINES  Aged Out       Assessment  This is a routine wellness examination for Carmen Green.  Health Maintenance: Due or Overdue There are no preventive care reminders to display for this patient.  Carmen Green does not need a referral for Community Assistance: Care Management:   no Social Work:    no Prescription Assistance:  no Nutrition/Diabetes Education:  no   Plan:  Personalized Goals  Goals Addressed             This Visit's Progress    AWV       01/07/2021 AWV Goal: Exercise for General Health  Patient will verbalize understanding of the benefits of increased physical activity: Exercising regularly is important. It will improve your overall fitness, flexibility, and endurance. Regular exercise also will improve your overall health. It can help you control your weight, reduce stress, and improve your bone density. Over the next year, patient will increase physical activity as tolerated with  a goal of at least 150 minutes of moderate physical activity per week.  You can tell that you are exercising at a moderate intensity if your heart starts beating faster and you start breathing faster but can still hold a conversation. Moderate-intensity exercise ideas include: Walking 1 mile (1.6 km) in about 15 minutes Biking Hiking Golfing Dancing Water aerobics Patient will verbalize understanding of everyday activities that increase physical activity by providing examples like the following: Yard  work, such as: Sales promotion account executive Gardening Washing windows or floors Patient will be able to explain general safety guidelines for exercising:  Before you start a new exercise program, talk with your health care provider. Do not exercise so much that you hurt yourself, feel dizzy, or get very short of breath. Wear comfortable clothes and wear shoes with good support. Drink plenty of water while you exercise to prevent dehydration or heat stroke. Work out until your breathing and your heartbeat get faster.        Personalized Health Maintenance & Screening Recommendations  Patient is up to date on health maintenance at this time  Lung Cancer Screening Recommended: no (Low Dose CT Chest recommended if Age 74-80 years, 30 pack-year currently smoking OR have quit w/in past 15 years) Hepatitis C Screening recommended: no HIV Screening recommended: no  Advanced Directives: Written information was not prepared per patient's request.  Referrals & Orders No orders of the defined types were placed in this encounter.   Follow-up Plan Follow-up with Carmen Norlander, DO as planned AVS printed and mailed to patient    I have personally reviewed and noted the following in the patient's chart:   Medical and social history Use of alcohol, tobacco or illicit drugs  Current medications and supplements Functional ability and status Nutritional status Physical activity Advanced directives List of other physicians Hospitalizations, surgeries, and ER visits in previous 12 months Vitals Screenings to include cognitive, depression, and falls Referrals and appointments  In addition, I have reviewed and discussed with Carmen Green certain preventive protocols, quality metrics, and best practice recommendations. A written personalized care plan for preventive services as well as general preventive health  recommendations is available and can be mailed to the patient at her request.      Gareth Morgan  01/07/2021

## 2021-01-08 LAB — TOXASSURE SELECT 13 (MW), URINE

## 2021-01-19 ENCOUNTER — Other Ambulatory Visit: Payer: Self-pay | Admitting: Family Medicine

## 2021-01-19 DIAGNOSIS — F410 Panic disorder [episodic paroxysmal anxiety] without agoraphobia: Secondary | ICD-10-CM

## 2021-01-19 DIAGNOSIS — F411 Generalized anxiety disorder: Secondary | ICD-10-CM

## 2021-01-20 ENCOUNTER — Other Ambulatory Visit: Payer: Self-pay | Admitting: Family Medicine

## 2021-01-20 DIAGNOSIS — F410 Panic disorder [episodic paroxysmal anxiety] without agoraphobia: Secondary | ICD-10-CM

## 2021-01-21 ENCOUNTER — Ambulatory Visit (INDEPENDENT_AMBULATORY_CARE_PROVIDER_SITE_OTHER): Payer: Medicare Other

## 2021-01-21 DIAGNOSIS — I442 Atrioventricular block, complete: Secondary | ICD-10-CM

## 2021-01-22 LAB — CUP PACEART REMOTE DEVICE CHECK
Battery Remaining Longevity: 117 mo
Battery Remaining Percentage: 93 %
Battery Voltage: 3.01 V
Brady Statistic AP VP Percent: 8 %
Brady Statistic AP VS Percent: 1 %
Brady Statistic AS VP Percent: 90 %
Brady Statistic AS VS Percent: 1 %
Brady Statistic RA Percent Paced: 6.3 %
Brady Statistic RV Percent Paced: 98 %
Date Time Interrogation Session: 20220804093918
Implantable Lead Implant Date: 20210803
Implantable Lead Implant Date: 20210803
Implantable Lead Location: 753859
Implantable Lead Location: 753860
Implantable Pulse Generator Implant Date: 20210803
Lead Channel Impedance Value: 400 Ohm
Lead Channel Impedance Value: 460 Ohm
Lead Channel Pacing Threshold Amplitude: 0.5 V
Lead Channel Pacing Threshold Amplitude: 0.625 V
Lead Channel Pacing Threshold Pulse Width: 0.4 ms
Lead Channel Pacing Threshold Pulse Width: 0.5 ms
Lead Channel Sensing Intrinsic Amplitude: 11.2 mV
Lead Channel Sensing Intrinsic Amplitude: 2.5 mV
Lead Channel Setting Pacing Amplitude: 0.875
Lead Channel Setting Pacing Amplitude: 2 V
Lead Channel Setting Pacing Pulse Width: 0.5 ms
Lead Channel Setting Sensing Sensitivity: 4 mV
Pulse Gen Model: 2272
Pulse Gen Serial Number: 3852139

## 2021-01-27 ENCOUNTER — Other Ambulatory Visit: Payer: Self-pay | Admitting: Family Medicine

## 2021-01-27 DIAGNOSIS — F410 Panic disorder [episodic paroxysmal anxiety] without agoraphobia: Secondary | ICD-10-CM

## 2021-01-27 DIAGNOSIS — I10 Essential (primary) hypertension: Secondary | ICD-10-CM

## 2021-02-17 DIAGNOSIS — S61217A Laceration without foreign body of left little finger without damage to nail, initial encounter: Secondary | ICD-10-CM | POA: Diagnosis not present

## 2021-02-17 DIAGNOSIS — Z23 Encounter for immunization: Secondary | ICD-10-CM | POA: Diagnosis not present

## 2021-02-17 NOTE — Progress Notes (Signed)
Remote pacemaker transmission.   

## 2021-02-18 ENCOUNTER — Telehealth: Payer: Self-pay | Admitting: Family Medicine

## 2021-02-18 DIAGNOSIS — Z780 Asymptomatic menopausal state: Secondary | ICD-10-CM

## 2021-02-18 MED ORDER — MEDROXYPROGESTERONE ACETATE 2.5 MG PO TABS: 2.5000 mg | ORAL_TABLET | Freq: Every day | ORAL | 1 refills | Status: DC

## 2021-02-18 NOTE — Telephone Encounter (Signed)
If she is on estrogen, she definitely should be on provera.  Please refill

## 2021-02-18 NOTE — Telephone Encounter (Signed)
Pt states she is on provera 2.'5mg'$  and isn't sure why it was taken off her med list. Ok to send in refill?

## 2021-02-18 NOTE — Addendum Note (Signed)
Addended by: Zannie Cove on: 02/18/2021 02:13 PM   Modules accepted: Orders

## 2021-02-18 NOTE — Telephone Encounter (Signed)
Sent in - pt aware

## 2021-02-26 DIAGNOSIS — Z4802 Encounter for removal of sutures: Secondary | ICD-10-CM | POA: Diagnosis not present

## 2021-02-27 ENCOUNTER — Ambulatory Visit (AMBULATORY_SURGERY_CENTER): Payer: Medicare Other | Admitting: Gastroenterology

## 2021-02-27 ENCOUNTER — Encounter: Payer: Self-pay | Admitting: Gastroenterology

## 2021-02-27 ENCOUNTER — Other Ambulatory Visit: Payer: Self-pay

## 2021-02-27 VITALS — BP 132/61 | HR 70 | Temp 97.8°F | Resp 16 | Ht 61.0 in | Wt 138.0 lb

## 2021-02-27 DIAGNOSIS — D120 Benign neoplasm of cecum: Secondary | ICD-10-CM

## 2021-02-27 DIAGNOSIS — Z1211 Encounter for screening for malignant neoplasm of colon: Secondary | ICD-10-CM

## 2021-02-27 MED ORDER — SODIUM CHLORIDE 0.9 % IV SOLN
500.0000 mL | Freq: Once | INTRAVENOUS | Status: DC
Start: 2021-02-27 — End: 2021-02-27

## 2021-02-27 NOTE — Progress Notes (Signed)
[facility] History and Physical   Primary Care Physician:  [name], DO   Reason for Procedure:  Colorectal cancer screening  Plan:    Screening colonoscopy with possible interventions as needed     HPI: Carmen Green is a very pleasant 70 y.o. female here for screening colonoscopy. Denies any nausea, vomiting, abdominal pain, melena or bright red blood per rectum  The risks and benefits as well as alternatives of endoscopic procedure(s) have been discussed and reviewed. All questions answered. The patient agrees to proceed.    Past Medical History:  Diagnosis Date   Allergy    Anemia    Anxiety    Arthritis    Cataract    bil cataracts removed   Colon polyp    Complete heart block (HCC)    Contusion of chest wall 10/10/2007   Centricity Description: CHEST WALL CONTUSION Qualifier: Diagnosis of  By: Aline Brochure MD, Dorothyann Peng   Centricity Description: CONTUSION, RIGHT RIB Qualifier: Diagnosis of  By: Aline Brochure MD, Stanley     Depression    Diverticulitis    Esophagitis    ESOPHAGITIS 06/25/2008   Qualifier: History of  By: Harlon Ditty CMA (AAMA), Dottie     GERD (gastroesophageal reflux disease)    H/O seasonal allergies    Heart murmur    Hiatal hernia    Hyperlipidemia    s/p PPM implant by Dr Olevia Perches 1992 with generator change 2004 ([facility]), she is device dependant   Hypertension    Mitral valve prolapse    Pacemaker     Past Surgical History:  Procedure Laterality Date   APPENDECTOMY     BREAST EXCISIONAL BIOPSY Right 07/08/1998   BREAST SURGERY Right    biopsy   COLONOSCOPY  10/19/2004   [place]   EYE SURGERY Left    cataract extraction with IOL   LAPAROSCOPIC LOW ANTERIOR RESECTION  11/16/2013   Robotic   LAPAROSCOPIC LYSIS OF ADHESIONS N/A 11/15/2013   Procedure: LAPAROSCOPIC LYSIS OF ADHESIONS;  Surgeon: Adin Hector, MD;  Location: [facility];  Service: General;  Laterality: N/A;   left ovary cyst removal  [place]  01/22/2020   Procedure: PACEMAKER IMPLANT;  Surgeon: Thompson Grayer, MD;  Location: [facility];  Service: Cardiovascular;  Laterality: N/A;   [place], 2004, 2012   device dependant   POLYPECTOMY     PROCTOSCOPY  11/15/2013   Procedure: RIGID PROCTOSCOPY;  Surgeon: Adin Hector, MD;  Location: [facility];  Service: General;;   ROBOTIC ASSISTED SALPINGO OOPHERECTOMY Left 11/16/2013   Robotic en bloc w LAR resection   TUBAL LIGATION     UPPER GASTROINTESTINAL ENDOSCOPY      Prior to Admission medications   Medication Sig Start Date End Date Taking? Authorizing Provider  Ascorbic Acid (VITAMIN C) 1000 MG tablet Take 1,000 mg by mouth daily.   Yes [provider]  buPROPion (WELLBUTRIN XL) 300 MG 24 hr tablet Take 1 tablet (300 mg total) by mouth daily. 01/05/21  Yes Gottschalk, Ashly M, DO  busPIRone (BUSPAR) 15 MG tablet TAKE  (1)  TABLET  THREE TIMES DAILY. 01/05/21  Yes Gottschalk, Leatrice Jewels M, DO  cetirizine (ZYRTEC) 10 MG tablet Take 10 mg by mouth daily as needed for allergies.   Yes [provider]  cholecalciferol (VITAMIN D) 25 MCG (1000 UNIT) tablet Take 1,000 Units by mouth daily.    Yes [provider]  D-MANNOSE PO Take 1 tablet  by mouth daily.   Yes [provider]  esomeprazole (NEXIUM) 40 MG capsule TAKE 1 CAPSULE ONCE DAILY AT NOON 12/04/20  Yes Rollo Farquhar, Venia Minks, MD  estradiol (ESTRACE) 0.1 MG/GM vaginal cream INSERT 1 GRAM VAGINALLY AT BEDTIME FOR 2 WEEKS, THEN TWICE A WEEK THEREAFTER 11/12/20  Yes [provider]  fluticasone (FLONASE) 50 MCG/ACT nasal spray Place 1 spray into both nostrils daily as needed for allergies. 01/05/21  Yes Gottschalk, Ashly M, DO  guaiFENesin (MUCINEX) 600 MG 12 hr tablet Take 600 mg by mouth 2 (two) times daily as needed for cough.   Yes [provider]  lisinopril-hydrochlorothiazide (ZESTORETIC) 20-12.5 MG tablet TAKE (1) TABLET DAILY IN THE MORNING. 01/05/21  Yes Gottschalk,  Ashly M, DO  LORazepam (ATIVAN) 1 MG tablet Take 1 tablet every night at bedtime.  May take 1/2 tablet during daytime if needed. 01/05/21  Yes Ronnie Doss M, DO  medroxyPROGESTERone (PROVERA) 2.5 MG tablet Take 1 tablet (2.5 mg total) by mouth daily. 02/18/21  Yes Ronnie Doss M, DO  Multiple Vitamin (MULTIVITAMIN) tablet Take 1 tablet by mouth daily.     Yes [provider]  ondansetron (ZOFRAN) 4 MG tablet To use as directed with colonoscopy prep,  1 30 minutes before the start of the prep and 1 30 minutes before the second part of her prep   the other two use as needed Patient taking differently: To use as directed with colonoscopy prep,  1 30 minutes before the start of the prep and 1 30 minutes before the second part of her prep   the other two use as needed 12/15/20  Yes Corvette Orser, Venia Minks, MD  polyethylene glycol powder (GLYCOLAX/MIRALAX) powder Take 1 Container by mouth daily as needed for mild constipation.    Yes [provider]  Zinc Sulfate (ZINC 15 PO) Take by mouth.   Yes [provider]  meclizine (ANTIVERT) 25 MG tablet Take 1 tablet (25 mg total) by mouth 3 (three) times daily as needed for dizziness. 04/07/18   Terald Sleeper, PA-C  trolamine salicylate (ASPERCREME) 10 % cream Apply 1 application topically as needed for muscle pain.    [provider]    Current Outpatient Medications  Medication Sig Dispense Refill   Ascorbic Acid (VITAMIN C) 1000 MG tablet Take 1,000 mg by mouth daily.     buPROPion (WELLBUTRIN XL) 300 MG 24 hr tablet Take 1 tablet (300 mg total) by mouth daily. 90 tablet 3   busPIRone (BUSPAR) 15 MG tablet TAKE  (1)  TABLET  THREE TIMES DAILY. 270 tablet 3   cetirizine (ZYRTEC) 10 MG tablet Take 10 mg by mouth daily as needed for allergies.     cholecalciferol (VITAMIN D) 25 MCG (1000 UNIT) tablet Take 1,000 Units by mouth daily.      D-MANNOSE PO Take 1 tablet by mouth daily.     esomeprazole (NEXIUM) 40 MG  capsule TAKE 1 CAPSULE ONCE DAILY AT NOON 90 capsule 3   estradiol (ESTRACE) 0.1 MG/GM vaginal cream INSERT 1 GRAM VAGINALLY AT BEDTIME FOR 2 WEEKS, THEN TWICE A WEEK THEREAFTER     fluticasone (FLONASE) 50 MCG/ACT nasal spray Place 1 spray into both nostrils daily as needed for allergies. 16 g 3   guaiFENesin (MUCINEX) 600 MG 12 hr tablet Take 600 mg by mouth 2 (two) times daily as needed for cough.     lisinopril-hydrochlorothiazide (ZESTORETIC) 20-12.5 MG tablet TAKE (1) TABLET DAILY IN THE MORNING. 90 tablet  3   LORazepam (ATIVAN) 1 MG tablet Take 1 tablet every night at bedtime.  May take 1/2 tablet during daytime if needed. 135 tablet 1   medroxyPROGESTERone (PROVERA) 2.5 MG tablet Take 1 tablet (2.5 mg total) by mouth daily. 90 tablet 1   Multiple Vitamin (MULTIVITAMIN) tablet Take 1 tablet by mouth daily.       ondansetron (ZOFRAN) 4 MG tablet To use as directed with colonoscopy prep,  1 30 minutes before the start of the prep and 1 30 minutes before the second part of her prep   the other two use as needed (Patient taking differently: To use as directed with colonoscopy prep,  1 30 minutes before the start of the prep and 1 30 minutes before the second part of her prep   the other two use as needed) 4 tablet 0   polyethylene glycol powder (GLYCOLAX/MIRALAX) powder Take 1 Container by mouth daily as needed for mild constipation.      Zinc Sulfate (ZINC 15 PO) Take by mouth.     meclizine (ANTIVERT) 25 MG tablet Take 1 tablet (25 mg total) by mouth 3 (three) times daily as needed for dizziness. 30 tablet 0   trolamine salicylate (ASPERCREME) 10 % cream Apply 1 application topically as needed for muscle pain.     Current Facility-Administered Medications  Medication Dose Route Frequency Provider Last Rate Last Admin   0.9 %  sodium chloride infusion  500 mL Intravenous Once Lakendra Helling, Venia Minks, MD        Allergies as of 02/27/2021 - Review Complete 02/27/2021  Allergen Reaction Noted    Zoloft [sertraline hcl] Nausea Only 05/14/2019   Amoxapine and related Rash 07/11/2015   Cephalexin Rash 10/10/2007   Oxycodone  12/05/2013   Penicillins Rash 10/10/2007    Family History  Problem Relation Age of Onset   Diabetes Mother    Stroke Mother    Diabetes Father    Lung cancer Father    Heart disease Maternal Aunt    Breast cancer Maternal Aunt    Heart disease Maternal Grandmother    Lung cancer Maternal Grandfather    Colon cancer Neg Hx    Esophageal cancer Neg Hx    Pancreatic cancer Neg Hx    Rectal cancer Neg Hx    Stomach cancer Neg Hx     Social History   Socioeconomic History   Marital status: Married    Spouse name: Not on file   Number of children: 1   Years of education: Not on file   Highest education level: Not on file  Occupational History   Occupation: REGISTRATION   Occupation: retired  Tobacco Use   Smoking status: Never   Smokeless tobacco: Never  Vaping Use   Vaping Use: Never used  Substance and Sexual Activity   Alcohol use: No   Drug use: No   Sexual activity: Not on file  Other Topics Concern   Not on file  Social History Narrative   Lives with spouse in [place]   Social Determinants of Health   Financial Resource Strain: Not on file  Food Insecurity: Not on file  Transportation Needs: Not on file  Physical Activity: Not on file  Stress: Not on file  Social Connections: Not on file  Intimate Partner Violence: Not on file    Review of Systems:  All other review of systems negative except as mentioned in the HPI.  Physical Exam: Vital signs in last 24 hours: BP Marland Kitchen)  161/90   Pulse 77   Temp 97.8 F (36.6 C)   Ht '5\' 1"'$  (1.549 m)   Wt 138 lb (62.6 kg)   SpO2 98%   BMI 26.07 kg/m     General:   Alert, NAD Lungs:  Clear .   Heart:  Regular rate and rhythm Abdomen:  Soft, nontender and nondistended. Neuro/Psych:  Alert and cooperative. Normal mood and affect. A and O x 3  Reviewed labs, radiology imaging, old  records and pertinent past GI work up  Patient is appropriate for planned procedure(s) and anesthesia in an ambulatory setting   K. Denzil Magnuson , MD [phone]

## 2021-02-27 NOTE — Op Note (Signed)
[facility] Patient Name: Carmen Green Procedure Date: 02/27/2021 10:34 AM MRN: [id number] Endoscopist: Mauri Pole , MD Age: 70 Referring MD:  Date of Birth: 1950-11-20 Gender: Female Account #: [id number] Procedure:                Colonoscopy Indications:              Screening for colorectal malignant neoplasm Medicines:                Monitored Anesthesia Care Procedure:                Pre-Anesthesia Assessment:                           - Prior to the procedure, a History and Physical                            was performed, and patient medications and                            allergies were reviewed. The patient's tolerance of                            previous anesthesia was also reviewed. The risks                            and benefits of the procedure and the sedation                            options and risks were discussed with the patient.                            All questions were answered, and informed consent                            was obtained. Prior Anticoagulants: The patient has                            taken no previous anticoagulant or antiplatelet                            agents. ASA Grade Assessment: II - A patient with                            mild systemic disease. After reviewing the risks                            and benefits, the patient was deemed in                            satisfactory condition to undergo the procedure.                           After obtaining informed consent, the colonoscope  was passed under direct vision. Throughout the                            procedure, the patient's blood pressure, pulse, and                            oxygen saturations were monitored continuously. The                            PCF-HQ190L Colonoscope was introduced through the                            anus and advanced to the the cecum, identified by                             appendiceal orifice and ileocecal valve. The                            colonoscopy was performed without difficulty. The                            patient tolerated the procedure well. The quality                            of the bowel preparation was excellent. The                            ileocecal valve, appendiceal orifice, and rectum                            were photographed. Scope In: 10:39:29 AM Scope Out: 10:53:21 AM Scope Withdrawal Time: 0 hours 9 minutes 10 seconds  Total Procedure Duration: 0 hours 13 minutes 52 seconds  Findings:                 The perianal and digital rectal examinations were                            normal.                           There was evidence of a prior functional end-to-end                            colo-colonic anastomosis in the recto-sigmoid                            colon. This was patent and was characterized by                            scar and visible small angiectasia near suture                            line. The anastomosis was traversed.  A 5 mm polyp was found in the cecum. The polyp was                            sessile. The polyp was removed with a cold snare.                            Resection and retrieval were complete.                           A few small-mouthed diverticula were found in the                            sigmoid colon.                           Non-bleeding external and internal hemorrhoids were                            found during retroflexion. The hemorrhoids were                            medium-sized. Complications:            No immediate complications. Estimated Blood Loss:     Estimated blood loss was minimal. Impression:               - Patent functional end-to-end colo-colonic                            anastomosis, characterized by scar and visible                            small angiectasia near suture line.                           - One 5 mm polyp  in the cecum, removed with a cold                            snare. Resected and retrieved.                           - Diverticulosis in the sigmoid colon.                           - Non-bleeding external and internal hemorrhoids. Recommendation:           - Patient has a contact number available for                            emergencies. The signs and symptoms of potential                            delayed complications were discussed with the                            patient. Return to normal activities  tomorrow.                            Written discharge instructions were provided to the                            patient.                           - Resume previous diet.                           - Continue present medications.                           - Await pathology results.                           - No repeat colonoscopy due to age.                           - Return to GI clinic PRN. Mauri Pole, MD 02/27/2021 11:01:14 AM This report has been signed electronically.

## 2021-02-27 NOTE — Progress Notes (Signed)
Called to room to assist during endoscopic procedure.  Patient ID and intended procedure confirmed with present staff. Received instructions for my participation in the procedure from the performing physician.  

## 2021-02-27 NOTE — Progress Notes (Signed)
Sedate, gd SR, tolerated procedure well, VSS, report to RN 

## 2021-02-27 NOTE — Progress Notes (Signed)
VS taken by C.W. 

## 2021-02-27 NOTE — Patient Instructions (Signed)
Handouts on hemorrhoids, polyps, and diverticulosis given.  Await pathology results. No repeat colonoscopy due to age.  Return to GI clinic as needed.    YOU HAD AN ENDOSCOPIC PROCEDURE TODAY AT [facility]:   Refer to the procedure report that was given to you for any specific questions about what was found during the examination.  If the procedure report does not answer your questions, please call your gastroenterologist to clarify.  If you requested that your care partner not be given the details of your procedure findings, then the procedure report has been included in a sealed envelope for you to review at your convenience later.  YOU SHOULD EXPECT: Some feelings of bloating in the abdomen. Passage of more gas than usual.  Walking can help get rid of the air that was put into your GI tract during the procedure and reduce the bloating. If you had a lower endoscopy (such as a colonoscopy or flexible sigmoidoscopy) you may notice spotting of blood in your stool or on the toilet paper. If you underwent a bowel prep for your procedure, you may not have a normal bowel movement for a few days.  Please Note:  You might notice some irritation and congestion in your nose or some drainage.  This is from the oxygen used during your procedure.  There is no need for concern and it should clear up in a day or so.  SYMPTOMS TO REPORT IMMEDIATELY:  Following lower endoscopy (colonoscopy or flexible sigmoidoscopy):  Excessive amounts of blood in the stool  Significant tenderness or worsening of abdominal pains  Swelling of the abdomen that is new, acute  Fever of 100F or higher   For urgent or emergent issues, a gastroenterologist can be reached at any hour by calling [phone]. Do not use MyChart messaging for urgent concerns.    DIET:  We do recommend a small meal at first, but then you may proceed to your regular diet.  Drink plenty of fluids but you should avoid alcoholic  beverages for 24 hours.  ACTIVITY:  You should plan to take it easy for the rest of today and you should NOT DRIVE or use heavy machinery until tomorrow (because of the sedation medicines used during the test).    FOLLOW UP: Our staff will call the number listed on your records 48-72 hours following your procedure to check on you and address any questions or concerns that you may have regarding the information given to you following your procedure. If we do not reach you, we will leave a message.  We will attempt to reach you two times.  During this call, we will ask if you have developed any symptoms of COVID 19. If you develop any symptoms (ie: fever, flu-like symptoms, shortness of breath, cough etc.) before then, please call [phone].  If you test positive for Covid 19 in the 2 weeks post procedure, please call and report this information to [place].    If any biopsies were taken you will be contacted by phone or by letter within the next 1-3 weeks.  Please call us at [phone] if you have not heard about the biopsies in 3 weeks.    SIGNATURES/CONFIDENTIALITY: You and/or your care partner have signed paperwork which will be entered into your electronic medical record.  These signatures attest to the fact that that the information above on your After Visit Summary has been reviewed and is understood.  Full responsibility of the confidentiality of this  discharge information lies with you and/or your care-partner.  

## 2021-03-03 ENCOUNTER — Telehealth: Payer: Self-pay | Admitting: *Deleted

## 2021-03-03 NOTE — Telephone Encounter (Signed)
  Follow up Call-  Call back number 02/27/2021  Post procedure Call Back phone  # [phone]  Permission to leave phone message Yes  Some recent data might be hidden     Patient questions:  Do you have a fever, pain , or abdominal swelling? No. Pain Score  0 *  Have you tolerated food without any problems? YES Have you been able to return to your normal activities? Yes.    Do you have any questions about your discharge instructions: Diet   No. Medications  No. Follow up visit  No.  Do you have questions or concerns about your Care? No.  Actions: * If pain score is 4 or above: No action needed, pain <4.

## 2021-03-08 ENCOUNTER — Encounter: Payer: Self-pay | Admitting: Gastroenterology

## 2021-03-11 ENCOUNTER — Encounter: Payer: Self-pay | Admitting: Gastroenterology

## 2021-03-23 ENCOUNTER — Ambulatory Visit (INDEPENDENT_AMBULATORY_CARE_PROVIDER_SITE_OTHER): Payer: Medicare Other | Admitting: Family

## 2021-03-23 ENCOUNTER — Encounter: Payer: Self-pay | Admitting: Family

## 2021-03-23 ENCOUNTER — Other Ambulatory Visit: Payer: Self-pay

## 2021-03-23 VITALS — BP 127/72 | HR 90 | Temp 97.9°F | Ht 61.0 in | Wt 139.6 lb

## 2021-03-23 DIAGNOSIS — Z87440 Personal history of urinary (tract) infections: Secondary | ICD-10-CM | POA: Diagnosis not present

## 2021-03-23 DIAGNOSIS — N3001 Acute cystitis with hematuria: Secondary | ICD-10-CM | POA: Diagnosis not present

## 2021-03-23 DIAGNOSIS — R399 Unspecified symptoms and signs involving the genitourinary system: Secondary | ICD-10-CM | POA: Diagnosis not present

## 2021-03-23 LAB — URINALYSIS, COMPLETE
Bilirubin, UA: NEGATIVE
Nitrite, UA: POSITIVE — AB
Specific Gravity, UA: 1.01 (ref 1.005–1.030)
Urobilinogen, Ur: 1 mg/dL (ref 0.2–1.0)
pH, UA: 5.5 (ref 5.0–7.5)

## 2021-03-23 LAB — MICROSCOPIC EXAMINATION
Renal Epithel, UA: NONE SEEN /hpf
WBC, UA: >30 /hpf — AB (ref 0–5)

## 2021-03-23 MED ORDER — ONDANSETRON HCL 4 MG PO TABS: 4.0000 mg | ORAL_TABLET | Freq: Three times a day (TID) | ORAL | 0 refills | Status: DC | PRN

## 2021-03-23 MED ORDER — DOXYCYCLINE HYCLATE 100 MG PO TABS: 100.0000 mg | ORAL_TABLET | Freq: Two times a day (BID) | ORAL | 0 refills | Status: DC

## 2021-03-23 NOTE — Progress Notes (Signed)
Subjective:    Patient ID: Carmen Green, female    DOB: 1950-07-23, 70 y.o.   MRN: [id number]  Chief Complaint  Patient presents with   Urinary Tract Infection   Urinary Frequency    Pressure and pain    Pt presents to the office today with UTI symptoms that started two days ago and worsen.  Urinary Tract Infection  Associated symptoms include frequency, hesitancy, nausea and urgency. Pertinent negatives include no discharge, flank pain, hematuria or vomiting.  Urinary Frequency  This is a new problem. The problem occurs intermittently. The problem has been unchanged. The quality of the pain is described as burning. The pain is at a severity of 8/10. The patient is experiencing no pain. Associated symptoms include frequency, hesitancy, nausea and urgency. Pertinent negatives include no discharge, flank pain, hematuria or vomiting. She has tried increased fluids (AZO) for the symptoms. The treatment provided mild relief.     Review of Systems  Gastrointestinal:  Positive for nausea. Negative for vomiting.  Genitourinary:  Positive for frequency, hesitancy and urgency. Negative for flank pain and hematuria.  All other systems reviewed and are negative.     Objective:   Physical Exam Vitals reviewed.  Constitutional:      General: She is not in acute distress.    Appearance: She is well-developed.  HENT:     Head: Normocephalic and atraumatic.  Eyes:     Pupils: Pupils are equal, round, and reactive to light.  Neck:     Thyroid: No thyromegaly.  Cardiovascular:     Rate and Rhythm: Normal rate and regular rhythm.     Heart sounds: Normal heart sounds. No murmur heard. Pulmonary:     Effort: Pulmonary effort is normal. No respiratory distress.     Breath sounds: Normal breath sounds. No wheezing.  Abdominal:     General: Bowel sounds are normal. There is no distension.     Palpations: Abdomen is soft.     Tenderness: There is no abdominal tenderness.  Musculoskeletal:         General: No tenderness. Normal range of motion.     Cervical back: Normal range of motion and neck supple.  Skin:    General: Skin is warm and dry.  Neurological:     Mental Status: She is alert and oriented to person, place, and time.     Cranial Nerves: No cranial nerve deficit.     Deep Tendon Reflexes: Reflexes are normal and symmetric.  Psychiatric:        Behavior: Behavior normal.        Thought Content: Thought content normal.        Judgment: Judgment normal.      BP 127/72   Pulse 90   Temp 97.9 F (36.6 C) (Temporal)   Ht 5\' 1"  (1.549 m)   Wt 139 lb 9.6 oz (63.3 kg)   BMI 26.38 kg/m      Assessment & Plan:  Carmen Green comes in today with chief complaint of Urinary Tract Infection and Urinary Frequency (Pressure and pain )   Diagnosis and orders addressed:  1. History of frequent urinary tract infections - Urinalysis, Complete - Urine Culture  2. UTI symptoms  3. Acute cystitis with hematuria -Start doxycycline, will give zofran as she states she get nausea with antibiotics.  Force fluids AZO over the counter X2 days RTO if symptoms worsen or do not improve  Culture pending - doxycycline (VIBRA-TABS) 100 MG tablet;  Take 1 tablet (100 mg total) by mouth 2 (two) times daily.  Dispense: 20 tablet; Refill: 0 - ondansetron (ZOFRAN) 4 MG tablet; Take 1 tablet (4 mg total) by mouth every 8 (eight) hours as needed for nausea or vomiting.  Dispense: 20 tablet; Refill: 0

## 2021-03-23 NOTE — Patient Instructions (Signed)
Urinary Tract Infection, Adult A urinary tract infection (UTI) is an infection of any part of the urinary tract. The urinary tract includes the kidneys, ureters, bladder, and urethra. These organs make, store, and get rid of urine in the body. An upper UTI affects the ureters and kidneys. A lower UTI affects the bladder and urethra. What are the causes? Most urinary tract infections are caused by bacteria in your genital area around your urethra, where urine leaves your body. These bacteria grow and cause inflammation of your urinary tract. What increases the risk? You are more likely to develop this condition if: You have a urinary catheter that stays in place. You are not able to control when you urinate or have a bowel movement (incontinence). You are female and you: Use a spermicide or diaphragm for birth control. Have low estrogen levels. Are pregnant. You have certain genes that increase your risk. You are sexually active. You take antibiotic medicines. You have a condition that causes your flow of urine to slow down, such as: An enlarged prostate, if you are female. Blockage in your urethra. A kidney stone. A nerve condition that affects your bladder control (neurogenic bladder). Not getting enough to drink, or not urinating often. You have certain medical conditions, such as: Diabetes. A weak disease-fighting system (immunesystem). Sickle cell disease. Gout. Spinal cord injury. What are the signs or symptoms? Symptoms of this condition include: Needing to urinate right away (urgency). Frequent urination. This may include small amounts of urine each time you urinate. Pain or burning with urination. Blood in the urine. Urine that smells bad or unusual. Trouble urinating. Cloudy urine. Vaginal discharge, if you are female. Pain in the abdomen or the lower back. You may also have: Vomiting or a decreased appetite. Confusion. Irritability or tiredness. A fever or  chills. Diarrhea. The first symptom in older adults may be confusion. In some cases, they may not have any symptoms until the infection has worsened. How is this diagnosed? This condition is diagnosed based on your medical history and a physical exam. You may also have other tests, including: Urine tests. Blood tests. Tests for STIs (sexually transmitted infections). If you have had more than one UTI, a cystoscopy or imaging studies may be done to determine the cause of the infections. How is this treated? Treatment for this condition includes: Antibiotic medicine. Over-the-counter medicines to treat discomfort. Drinking enough water to stay hydrated. If you have frequent infections or have other conditions such as a kidney stone, you may need to see a health care provider who specializes in the urinary tract (urologist). In rare cases, urinary tract infections can cause sepsis. Sepsis is a life-threatening condition that occurs when the body responds to an infection. Sepsis is treated in the hospital with IV antibiotics, fluids, and other medicines. Follow these instructions at home: Medicines Take over-the-counter and prescription medicines only as told by your health care provider. If you were prescribed an antibiotic medicine, take it as told by your health care provider. Do not stop using the antibiotic even if you start to feel better. General instructions Make sure you: Empty your bladder often and completely. Do not hold urine for long periods of time. Empty your bladder after sex. Wipe from front to back after urinating or having a bowel movement if you are female. Use each tissue only one time when you wipe. Drink enough fluid to keep your urine pale yellow. Keep all follow-up visits. This is important. Contact a health care provider   if: Your symptoms do not get better after 1-2 days. Your symptoms go away and then return. Get help right away if: You have severe pain in your  back or your lower abdomen. You have a fever or chills. You have nausea or vomiting. Summary A urinary tract infection (UTI) is an infection of any part of the urinary tract, which includes the kidneys, ureters, bladder, and urethra. Most urinary tract infections are caused by bacteria in your genital area. Treatment for this condition often includes antibiotic medicines. If you were prescribed an antibiotic medicine, take it as told by your health care provider. Do not stop using the antibiotic even if you start to feel better. Keep all follow-up visits. This is important. This information is not intended to replace advice given to you by your health care provider. Make sure you discuss any questions you have with your health care provider. Document Revised: 01/18/2020 Document Reviewed: 01/18/2020 Elsevier Patient Education  2022 Elsevier Inc.  

## 2021-03-26 LAB — URINE CULTURE

## 2021-04-16 DIAGNOSIS — N3020 Other chronic cystitis without hematuria: Secondary | ICD-10-CM | POA: Diagnosis not present

## 2021-04-16 DIAGNOSIS — N952 Postmenopausal atrophic vaginitis: Secondary | ICD-10-CM | POA: Diagnosis not present

## 2021-04-22 ENCOUNTER — Ambulatory Visit (INDEPENDENT_AMBULATORY_CARE_PROVIDER_SITE_OTHER): Payer: Medicare Other

## 2021-04-22 DIAGNOSIS — I442 Atrioventricular block, complete: Secondary | ICD-10-CM | POA: Diagnosis not present

## 2021-04-22 LAB — CUP PACEART REMOTE DEVICE CHECK
Battery Remaining Longevity: 113 mo
Battery Remaining Percentage: 91 %
Battery Voltage: 3.01 V
Brady Statistic AP VP Percent: 7.7 %
Brady Statistic AP VS Percent: 1 %
Brady Statistic AS VP Percent: 90 %
Brady Statistic AS VS Percent: 1.1 %
Brady Statistic RA Percent Paced: 5.9 %
Brady Statistic RV Percent Paced: 97 %
Date Time Interrogation Session: 20221102035200
Implantable Lead Implant Date: 20210803
Implantable Lead Implant Date: 20210803
Implantable Lead Location: 753859
Implantable Lead Location: 753860
Implantable Pulse Generator Implant Date: 20210803
Lead Channel Impedance Value: 410 Ohm
Lead Channel Impedance Value: 490 Ohm
Lead Channel Pacing Threshold Amplitude: 0.5 V
Lead Channel Pacing Threshold Amplitude: 0.875 V
Lead Channel Pacing Threshold Pulse Width: 0.4 ms
Lead Channel Pacing Threshold Pulse Width: 0.5 ms
Lead Channel Sensing Intrinsic Amplitude: 10.2 mV
Lead Channel Sensing Intrinsic Amplitude: 2.2 mV
Lead Channel Setting Pacing Amplitude: 1.125
Lead Channel Setting Pacing Amplitude: 2 V
Lead Channel Setting Pacing Pulse Width: 0.5 ms
Lead Channel Setting Sensing Sensitivity: 4 mV
Pulse Gen Model: 2272
Pulse Gen Serial Number: 3852139

## 2021-04-24 ENCOUNTER — Ambulatory Visit (INDEPENDENT_AMBULATORY_CARE_PROVIDER_SITE_OTHER): Payer: Medicare Other

## 2021-04-24 ENCOUNTER — Other Ambulatory Visit: Payer: Self-pay

## 2021-04-24 DIAGNOSIS — Z23 Encounter for immunization: Secondary | ICD-10-CM

## 2021-04-28 NOTE — Progress Notes (Signed)
Remote pacemaker transmission.   

## 2021-05-13 ENCOUNTER — Encounter: Payer: Self-pay | Admitting: Student

## 2021-05-13 ENCOUNTER — Other Ambulatory Visit: Payer: Self-pay

## 2021-05-13 ENCOUNTER — Ambulatory Visit: Payer: Medicare Other | Admitting: Student

## 2021-05-13 VITALS — BP 128/68 | HR 69 | Ht 61.0 in | Wt 139.0 lb

## 2021-05-13 DIAGNOSIS — I442 Atrioventricular block, complete: Secondary | ICD-10-CM

## 2021-05-13 LAB — CUP PACEART INCLINIC DEVICE CHECK
Battery Remaining Longevity: 109 mo
Battery Voltage: 3.01 V
Brady Statistic RA Percent Paced: 5.8 %
Brady Statistic RV Percent Paced: 97 %
Date Time Interrogation Session: 20221123120150
Implantable Lead Implant Date: 20210803
Implantable Lead Implant Date: 20210803
Implantable Lead Location: 753859
Implantable Lead Location: 753860
Implantable Pulse Generator Implant Date: 20210803
Lead Channel Impedance Value: 437.5 Ohm
Lead Channel Impedance Value: 475 Ohm
Lead Channel Pacing Threshold Amplitude: 0.75 V
Lead Channel Pacing Threshold Amplitude: 0.75 V
Lead Channel Pacing Threshold Amplitude: 0.875 V
Lead Channel Pacing Threshold Pulse Width: 0.4 ms
Lead Channel Pacing Threshold Pulse Width: 0.4 ms
Lead Channel Pacing Threshold Pulse Width: 0.5 ms
Lead Channel Sensing Intrinsic Amplitude: 2.2 mV
Lead Channel Sensing Intrinsic Amplitude: 4.5 mV
Lead Channel Setting Pacing Amplitude: 1.125
Lead Channel Setting Pacing Amplitude: 2 V
Lead Channel Setting Pacing Pulse Width: 0.5 ms
Lead Channel Setting Sensing Sensitivity: 4 mV
Pulse Gen Model: 2272
Pulse Gen Serial Number: 3852139

## 2021-05-13 NOTE — Patient Instructions (Signed)
Medication Instructions:  Your physician recommends that you continue on your current medications as directed. Please refer to the Current Medication list given to you today.  *If you need a refill on your cardiac medications before your next appointment, please call your pharmacy*   Lab Work: None ordered.  If you have labs (blood work) drawn today and your tests are completely normal, you will receive your results only by: [place] (if you have MyChart) OR A paper copy in the mail If you have any lab test that is abnormal or we need to change your treatment, we will call you to review the results.   Testing/Procedures: None ordered.    Follow-Up: At [facility], you and your health needs are our priority.  As part of our continuing mission to provide you with exceptional heart care, we have created designated Provider Care Teams.  These Care Teams include your primary Cardiologist (physician) and Advanced Practice Providers (APPs -  Physician Assistants and Nurse Practitioners) who all work together to provide you with the care you need, when you need it.  We recommend signing up for the patient portal called "MyChart".  Sign up information is provided on this After Visit Summary.  MyChart is used to connect with patients for Virtual Visits (Telemedicine).  Patients are able to view lab/test results, encounter notes, upcoming appointments, etc.  Non-urgent messages can be sent to your provider as well.   To learn more about what you can do with MyChart, go to [email or web address].    Your next appointment:   Follow up in 12 months

## 2021-05-13 NOTE — Progress Notes (Signed)
Electrophysiology Office Note Date: 05/13/2021  ID:  Carmen Green, DOB 07/01/1950, MRN [id number]  PCP: [name], DO Primary Cardiologist: None Electrophysiologist: Thompson Grayer, MD   CC: Pacemaker follow-up  Carmen Green is a 70 y.o. female seen today for Thompson Grayer, MD for routine electrophysiology followup.  Since last being seen in our clinic the patient reports doing very well.  she denies chest pain, palpitations, dyspnea, PND, orthopnea, nausea, vomiting, dizziness, syncope, edema, weight gain, or early satiety.  Device History: St. Jude Dual Chamber PPM implanted [phone] for CHB, gen change 2004, gen change 2012, Abandoned R side device and L sided device implant 01/2020  Past Medical History:  Diagnosis Date   Allergy    Anemia    Anxiety    Arthritis    Cataract    bil cataracts removed   Colon polyp    Complete heart block ([place])    Contusion of chest wall 10/10/2007   Centricity Description: CHEST WALL CONTUSION Qualifier: Diagnosis of  By: Aline Brochure MD, Dorothyann Peng   Centricity Description: CONTUSION, RIGHT RIB Qualifier: Diagnosis of  By: Aline Brochure MD, Stanley     Depression    Diverticulitis    Esophagitis    ESOPHAGITIS 06/25/2008   Qualifier: History of  By: Harlon Ditty CMA (AAMA), Dottie     GERD (gastroesophageal reflux disease)    H/O seasonal allergies    Heart murmur    Hiatal hernia    Hyperlipidemia    s/p PPM implant by Dr Olevia Perches 1992 with generator change 2004 ([facility]), she is device dependant   Hypertension    Mitral valve prolapse    Pacemaker    Past Surgical History:  Procedure Laterality Date   APPENDECTOMY     BREAST EXCISIONAL BIOPSY Right 07/08/1998   BREAST SURGERY Right    biopsy   COLONOSCOPY  10/19/2004   [place]   EYE SURGERY Left    cataract extraction with IOL   LAPAROSCOPIC LOW ANTERIOR RESECTION  11/16/2013   Robotic   LAPAROSCOPIC LYSIS OF ADHESIONS N/A 11/15/2013   Procedure: LAPAROSCOPIC LYSIS OF ADHESIONS;   Surgeon: Adin Hector, MD;  Location: [facility];  Service: General;  Laterality: N/A;   left ovary cyst removal  [place] N/A 01/22/2020   Procedure: PACEMAKER IMPLANT;  Surgeon: Thompson Grayer, MD;  Location: [place] [facility];  Service: Cardiovascular;  Laterality: N/A;   [place], 2004, 2012   device dependant   POLYPECTOMY     PROCTOSCOPY  11/15/2013   Procedure: RIGID PROCTOSCOPY;  Surgeon: Adin Hector, MD;  Location: [facility];  Service: General;;   ROBOTIC ASSISTED SALPINGO OOPHERECTOMY Left 11/16/2013   Robotic en bloc w LAR resection   TUBAL LIGATION     UPPER GASTROINTESTINAL ENDOSCOPY      Current Outpatient Medications  Medication Sig Dispense Refill   Ascorbic Acid (VITAMIN C) 1000 MG tablet Take 1,000 mg by mouth daily.     buPROPion (WELLBUTRIN XL) 300 MG 24 hr tablet Take 1 tablet (300 mg total) by mouth daily. 90 tablet 3   busPIRone (BUSPAR) 15 MG tablet TAKE  (1)  TABLET  THREE TIMES DAILY. 270 tablet 3   cetirizine (ZYRTEC) 10 MG tablet Take 10 mg by mouth daily as needed for allergies.     cholecalciferol (VITAMIN D) 25 MCG (1000 UNIT) tablet Take 1,000 Units by mouth daily.      D-MANNOSE PO Take 1 tablet by mouth  daily.     esomeprazole (NEXIUM) 40 MG capsule TAKE 1 CAPSULE ONCE DAILY AT NOON 90 capsule 3   estradiol (ESTRACE) 0.1 MG/GM vaginal cream INSERT 1 GRAM VAGINALLY AT BEDTIME FOR 2 WEEKS, THEN TWICE A WEEK THEREAFTER     fluticasone (FLONASE) 50 MCG/ACT nasal spray Place 1 spray into both nostrils daily as needed for allergies. 16 g 3   guaiFENesin (MUCINEX) 600 MG 12 hr tablet Take 600 mg by mouth 2 (two) times daily as needed for cough.     lisinopril-hydrochlorothiazide (ZESTORETIC) 20-12.5 MG tablet TAKE (1) TABLET DAILY IN THE MORNING. 90 tablet 3   LORazepam (ATIVAN) 1 MG tablet Take 1 tablet every night at bedtime.  May take 1/2 tablet during daytime if needed. 135 tablet 1   meclizine (ANTIVERT) 25 MG tablet Take 1  tablet (25 mg total) by mouth 3 (three) times daily as needed for dizziness. 30 tablet 0   medroxyPROGESTERone (PROVERA) 2.5 MG tablet Take 1 tablet (2.5 mg total) by mouth daily. 90 tablet 1   Multiple Vitamin (MULTIVITAMIN) tablet Take 1 tablet by mouth daily.       polyethylene glycol powder (GLYCOLAX/MIRALAX) powder Take 1 Container by mouth daily as needed for mild constipation.      trolamine salicylate (ASPERCREME) 10 % cream Apply 1 application topically as needed for muscle pain.     Zinc Sulfate (ZINC 15 PO) Take by mouth.     No current facility-administered medications for this visit.    Allergies:   Zoloft [sertraline hcl], Amoxapine and related, Cephalexin, Oxycodone, and Penicillins   Social History: Social History   Socioeconomic History   Marital status: Married    Spouse name: Not on file   Number of children: 1   Years of education: Not on file   Highest education level: Not on file  Occupational History   Occupation: REGISTRATION   Occupation: retired  Tobacco Use   Smoking status: Never   Smokeless tobacco: Never  Scientific laboratory technician Use: Never used  Substance and Sexual Activity   Alcohol use: No   Drug use: No   Sexual activity: Not on file  Other Topics Concern   Not on file  Social History Narrative   Lives with spouse in [place] Determinants of Health   Financial Resource Strain: Not on file  Food Insecurity: Not on file  Transportation Needs: Not on file  Physical Activity: Not on file  Stress: Not on file  Social Connections: Not on file  Intimate Partner Violence: Not on file    Family History: Family History  Problem Relation Age of Onset   Diabetes Mother    Stroke Mother    Diabetes Father    Lung cancer Father    Heart disease Maternal Aunt    Breast cancer Maternal Aunt    Heart disease Maternal Grandmother    Lung cancer Maternal Grandfather    Colon cancer Neg Hx    Esophageal cancer Neg Hx    Pancreatic cancer  Neg Hx    Rectal cancer Neg Hx    Stomach cancer Neg Hx      Review of Systems: All other systems reviewed and are otherwise negative except as noted above.  Physical Exam: There were no vitals filed for this visit.   GEN- The patient is well appearing, alert and oriented x 3 today.   HEENT: normocephalic, atraumatic; sclera clear, conjunctiva pink; hearing intact; oropharynx clear; neck supple  Lungs- Clear to ausculation bilaterally, normal work of breathing.  No wheezes, rales, rhonchi Heart- Regular rate and rhythm, no murmurs, rubs or gallops  GI- soft, non-tender, non-distended, bowel sounds present  Extremities- no clubbing or cyanosis. No edema MS- no significant deformity or atrophy Skin- warm and dry, no rash or lesion; PPM pocket well healed Psych- euthymic mood, full affect Neuro- strength and sensation are intact  PPM Interrogation- reviewed in detail today,  See PACEART report  EKG:  EKG is ordered today. Personal review of ekg ordered today shows AS VP at 70 bpm   Recent Labs: No results found for requested labs within last 8760 hours.   Wt Readings from Last 3 Encounters:  03/23/21 139 lb 9.6 oz (63.3 kg)  02/27/21 138 lb (62.6 kg)  01/05/21 140 lb 9.6 oz (63.8 kg)     Other studies Reviewed: Additional studies/ records that were reviewed today include: Previous EP office notes, Previous remote checks, Most recent labwork.   Assessment and Plan:  1. CHB s/p St. Jude PPM (R side abandoned, L sided implant 01/2020) Normal PPM function See Pace Art report No changes today  Current medicines are reviewed at length with the patient today.    Disposition:   Follow up with Dr. Rayann Heman in 12 months    Signed, Shirley Friar, PA-C  05/13/2021 11:20 AM  [place] [address] [place] [address] [phone] (office) [phone] (fax)

## 2021-05-28 ENCOUNTER — Other Ambulatory Visit: Payer: Self-pay | Admitting: Family Medicine

## 2021-05-28 DIAGNOSIS — J301 Allergic rhinitis due to pollen: Secondary | ICD-10-CM

## 2021-06-01 ENCOUNTER — Encounter: Payer: Self-pay | Admitting: Family

## 2021-06-01 ENCOUNTER — Ambulatory Visit (INDEPENDENT_AMBULATORY_CARE_PROVIDER_SITE_OTHER): Payer: Medicare Other | Admitting: Family

## 2021-06-01 VITALS — BP 144/66 | HR 98 | Temp 97.8°F | Ht 61.0 in | Wt 138.2 lb

## 2021-06-01 DIAGNOSIS — R399 Unspecified symptoms and signs involving the genitourinary system: Secondary | ICD-10-CM

## 2021-06-01 DIAGNOSIS — N3001 Acute cystitis with hematuria: Secondary | ICD-10-CM

## 2021-06-01 LAB — URINALYSIS, COMPLETE
Bilirubin, UA: NEGATIVE
Nitrite, UA: POSITIVE — AB
Specific Gravity, UA: 1.015 (ref 1.005–1.030)
Urobilinogen, Ur: 4 mg/dL — ABNORMAL HIGH (ref 0.2–1.0)
pH, UA: 5 (ref 5.0–7.5)

## 2021-06-01 LAB — MICROSCOPIC EXAMINATION
Renal Epithel, UA: NONE SEEN /hpf
WBC, UA: >30 /hpf — AB (ref 0–5)

## 2021-06-01 MED ORDER — SULFAMETHOXAZOLE-TRIMETHOPRIM 800-160 MG PO TABS: 1.0000 | ORAL_TABLET | Freq: Two times a day (BID) | ORAL | 0 refills | Status: DC

## 2021-06-01 NOTE — Patient Instructions (Signed)
Urinary Tract Infection, Adult A urinary tract infection (UTI) is an infection of any part of the urinary tract. The urinary tract includes the kidneys, ureters, bladder, and urethra. These organs make, store, and get rid of urine in the body. An upper UTI affects the ureters and kidneys. A lower UTI affects the bladder and urethra. What are the causes? Most urinary tract infections are caused by bacteria in your genital area around your urethra, where urine leaves your body. These bacteria grow and cause inflammation of your urinary tract. What increases the risk? You are more likely to develop this condition if: You have a urinary catheter that stays in place. You are not able to control when you urinate or have a bowel movement (incontinence). You are female and you: Use a spermicide or diaphragm for birth control. Have low estrogen levels. Are pregnant. You have certain genes that increase your risk. You are sexually active. You take antibiotic medicines. You have a condition that causes your flow of urine to slow down, such as: An enlarged prostate, if you are female. Blockage in your urethra. A kidney stone. A nerve condition that affects your bladder control (neurogenic bladder). Not getting enough to drink, or not urinating often. You have certain medical conditions, such as: Diabetes. A weak disease-fighting system (immunesystem). Sickle cell disease. Gout. Spinal cord injury. What are the signs or symptoms? Symptoms of this condition include: Needing to urinate right away (urgency). Frequent urination. This may include small amounts of urine each time you urinate. Pain or burning with urination. Blood in the urine. Urine that smells bad or unusual. Trouble urinating. Cloudy urine. Vaginal discharge, if you are female. Pain in the abdomen or the lower back. You may also have: Vomiting or a decreased appetite. Confusion. Irritability or tiredness. A fever or  chills. Diarrhea. The first symptom in older adults may be confusion. In some cases, they may not have any symptoms until the infection has worsened. How is this diagnosed? This condition is diagnosed based on your medical history and a physical exam. You may also have other tests, including: Urine tests. Blood tests. Tests for STIs (sexually transmitted infections). If you have had more than one UTI, a cystoscopy or imaging studies may be done to determine the cause of the infections. How is this treated? Treatment for this condition includes: Antibiotic medicine. Over-the-counter medicines to treat discomfort. Drinking enough water to stay hydrated. If you have frequent infections or have other conditions such as a kidney stone, you may need to see a health care provider who specializes in the urinary tract (urologist). In rare cases, urinary tract infections can cause sepsis. Sepsis is a life-threatening condition that occurs when the body responds to an infection. Sepsis is treated in the hospital with IV antibiotics, fluids, and other medicines. Follow these instructions at home: Medicines Take over-the-counter and prescription medicines only as told by your health care provider. If you were prescribed an antibiotic medicine, take it as told by your health care provider. Do not stop using the antibiotic even if you start to feel better. General instructions Make sure you: Empty your bladder often and completely. Do not hold urine for long periods of time. Empty your bladder after sex. Wipe from front to back after urinating or having a bowel movement if you are female. Use each tissue only one time when you wipe. Drink enough fluid to keep your urine pale yellow. Keep all follow-up visits. This is important. Contact a health care provider   if: Your symptoms do not get better after 1-2 days. Your symptoms go away and then return. Get help right away if: You have severe pain in your  back or your lower abdomen. You have a fever or chills. You have nausea or vomiting. Summary A urinary tract infection (UTI) is an infection of any part of the urinary tract, which includes the kidneys, ureters, bladder, and urethra. Most urinary tract infections are caused by bacteria in your genital area. Treatment for this condition often includes antibiotic medicines. If you were prescribed an antibiotic medicine, take it as told by your health care provider. Do not stop using the antibiotic even if you start to feel better. Keep all follow-up visits. This is important. This information is not intended to replace advice given to you by your health care provider. Make sure you discuss any questions you have with your health care provider. Document Revised: 01/18/2020 Document Reviewed: 01/18/2020 Elsevier Patient Education  2022 Elsevier Inc.  

## 2021-06-01 NOTE — Progress Notes (Signed)
   Subjective:    Patient ID: Carmen Green, female    DOB: 08-12-1950, 70 y.o.   MRN: [id number]  Chief Complaint  Patient presents with   Urinary Tract Infection    Dysuria  This is a new problem. The current episode started in the past 7 days. The problem occurs intermittently. The problem has been gradually worsening. The quality of the pain is described as burning. The pain is at a severity of 10/10. The pain is mild. Associated symptoms include flank pain, frequency, nausea and urgency. Pertinent negatives include no discharge, hematuria or vomiting. She has tried increased fluids (azo) for the symptoms.     Review of Systems  Gastrointestinal:  Positive for nausea. Negative for vomiting.  Genitourinary:  Positive for dysuria, flank pain, frequency and urgency. Negative for hematuria.  All other systems reviewed and are negative.     Objective:   Physical Exam Vitals reviewed.  Constitutional:      General: She is not in acute distress.    Appearance: She is well-developed.  HENT:     Head: Normocephalic and atraumatic.     Right Ear: Tympanic membrane normal.     Left Ear: Tympanic membrane normal.  Eyes:     Pupils: Pupils are equal, round, and reactive to light.  Neck:     Thyroid: No thyromegaly.  Cardiovascular:     Rate and Rhythm: Normal rate and regular rhythm.     Heart sounds: Normal heart sounds. No murmur heard. Pulmonary:     Effort: Pulmonary effort is normal. No respiratory distress.     Breath sounds: Normal breath sounds. No wheezing.  Abdominal:     General: Bowel sounds are normal. There is no distension.     Palpations: Abdomen is soft.     Tenderness: There is no abdominal tenderness.  Musculoskeletal:        General: No tenderness. Normal range of motion.     Cervical back: Normal range of motion and neck supple.  Skin:    General: Skin is warm and dry.  Neurological:     Mental Status: She is alert and oriented to person, place, and  time.     Cranial Nerves: No cranial nerve deficit.     Deep Tendon Reflexes: Reflexes are normal and symmetric.  Psychiatric:        Behavior: Behavior normal.        Thought Content: Thought content normal.        Judgment: Judgment normal.     BP (!) 144/66   Pulse 98   Temp 97.8 F (36.6 C) (Temporal)   Ht 5\' 1"  (1.549 m)   Wt 138 lb 3.2 oz (62.7 kg)   BMI 26.11 kg/m       Assessment & Plan:  Carmen Green comes in today with chief complaint of Urinary Tract Infection   Diagnosis and orders addressed:  1. UTI symptoms - Urinalysis, Complete - Urine Culture - sulfamethoxazole-trimethoprim (BACTRIM DS) 800-160 MG tablet; Take 1 tablet by mouth 2 (two) times daily.  Dispense: 14 tablet; Refill: 0  2. Acute cystitis with hematuria Force fluids AZO over the counter X2 days RTO if symptoms worsen or do not improve  Culture pending - sulfamethoxazole-trimethoprim (BACTRIM DS) 800-160 MG tablet; Take 1 tablet by mouth 2 (two) times daily.  Dispense: 14 tablet; Refill: 0    Carmen Dun, FNP

## 2021-06-05 LAB — URINE CULTURE

## 2021-06-08 ENCOUNTER — Other Ambulatory Visit: Payer: Self-pay | Admitting: Family

## 2021-06-08 MED ORDER — NITROFURANTOIN MONOHYD MACRO 100 MG PO CAPS: 100.0000 mg | ORAL_CAPSULE | Freq: Two times a day (BID) | ORAL | 0 refills | Status: DC

## 2021-06-11 IMAGING — MG MM DIGITAL SCREENING BILAT W/ TOMO AND CAD
8 series · 9 of 24 positions shown · non-contrast
Comparison: Previous exam(s).

CLINICAL DATA: Screening.

EXAM:
DIGITAL SCREENING BILATERAL MAMMOGRAM WITH TOMOSYNTHESIS AND CAD
TECHNIQUE: Bilateral screening digital craniocaudal and mediolateral oblique
mammograms were obtained. Bilateral screening digital breast
tomosynthesis was performed. The images were evaluated with
computer-aided detection.

[R MLO synth-2D]
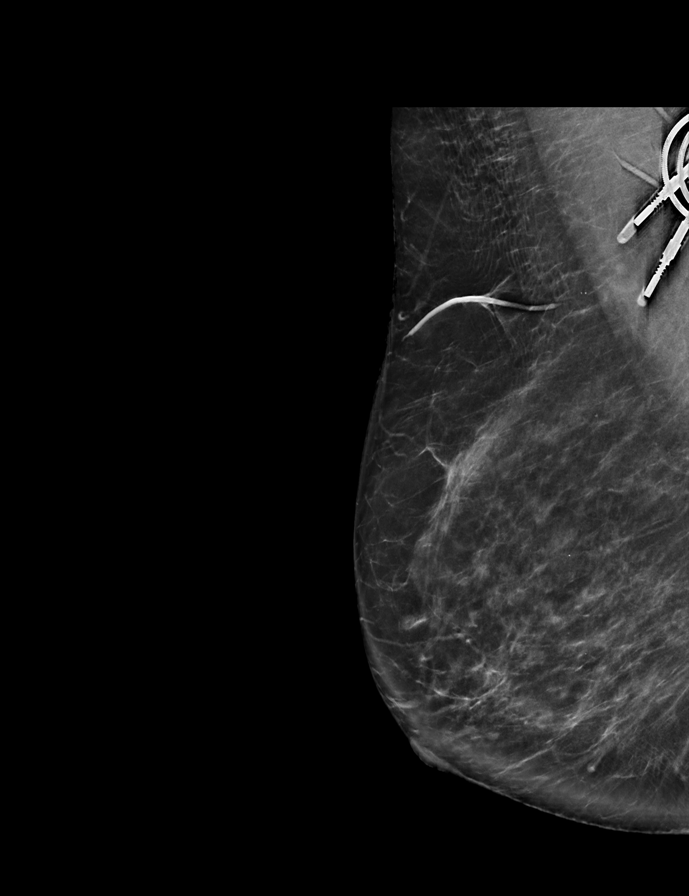

[L MLO synth-2D]
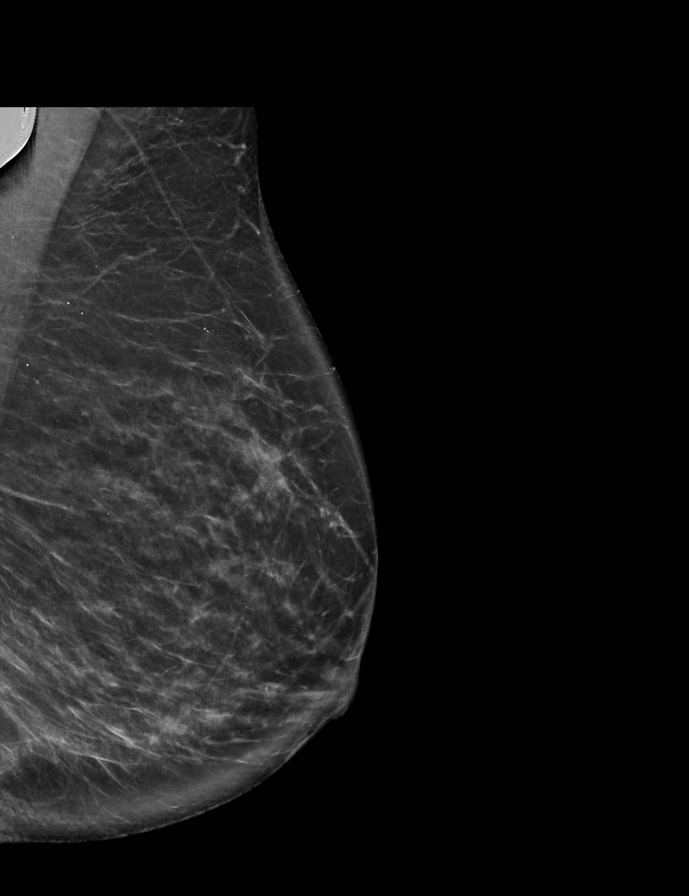

[R CC synth-2D]
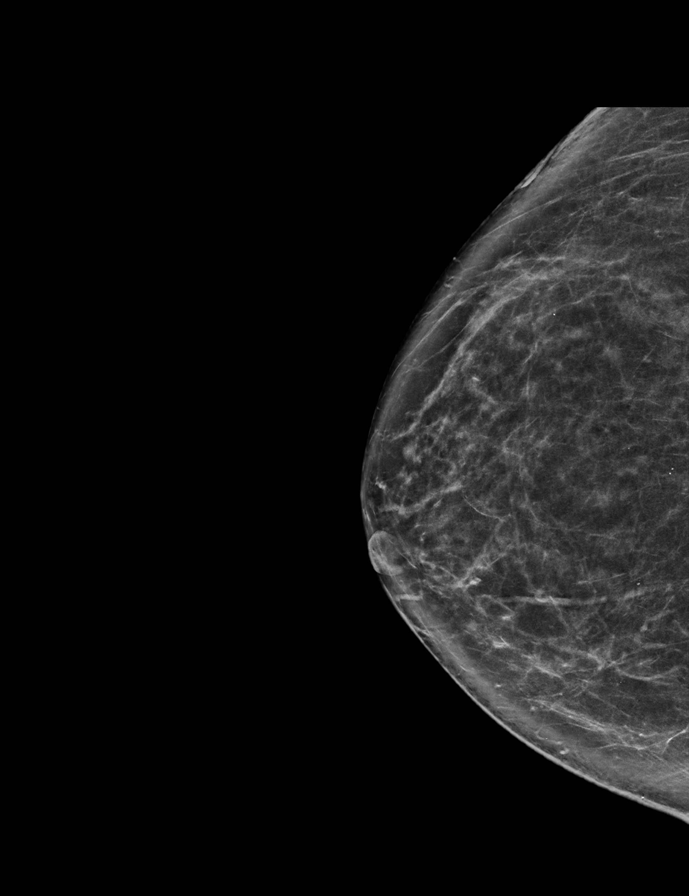

[L CC synth-2D]
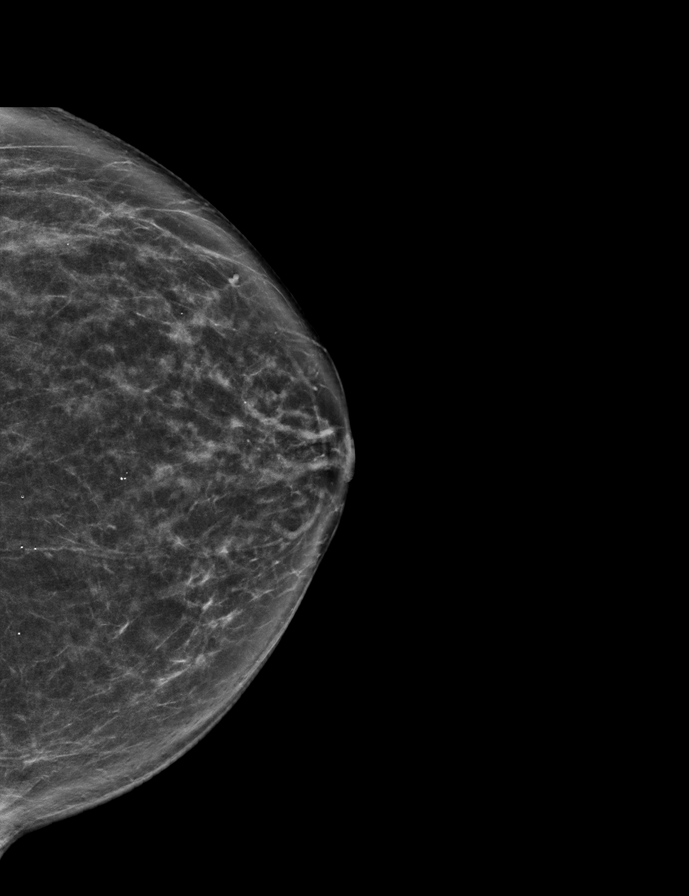

[L CC tomo · 2 of 72 frames shown]
[frame 24/72]
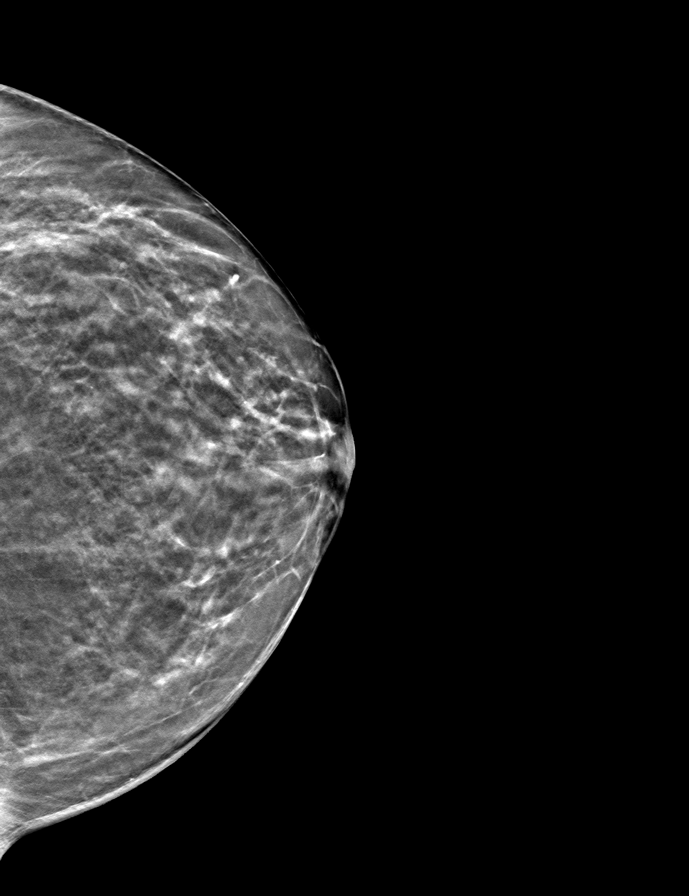
[frame 37/72]
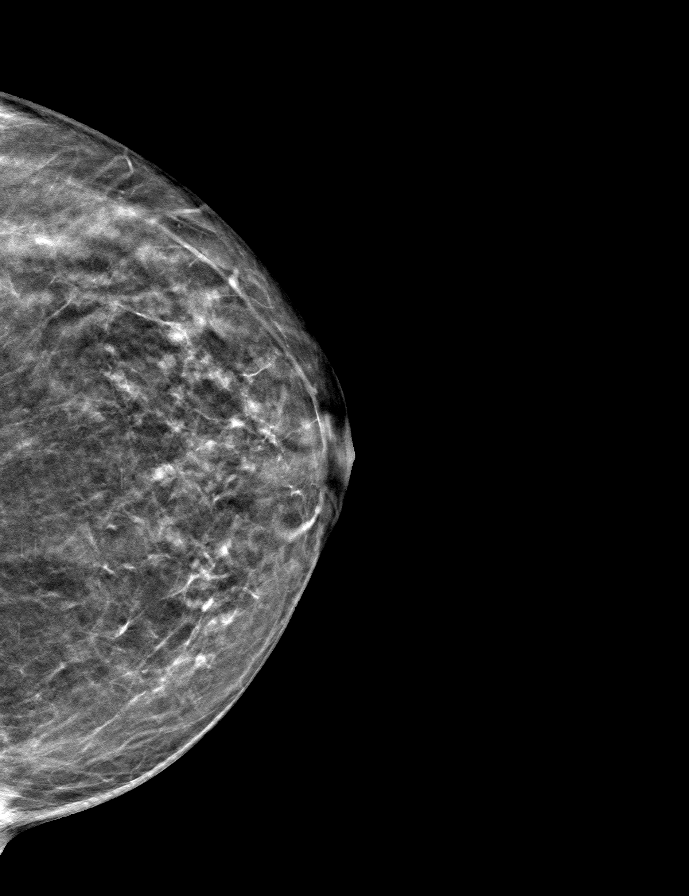

[R MLO tomo · tomo slice 37/72.0]
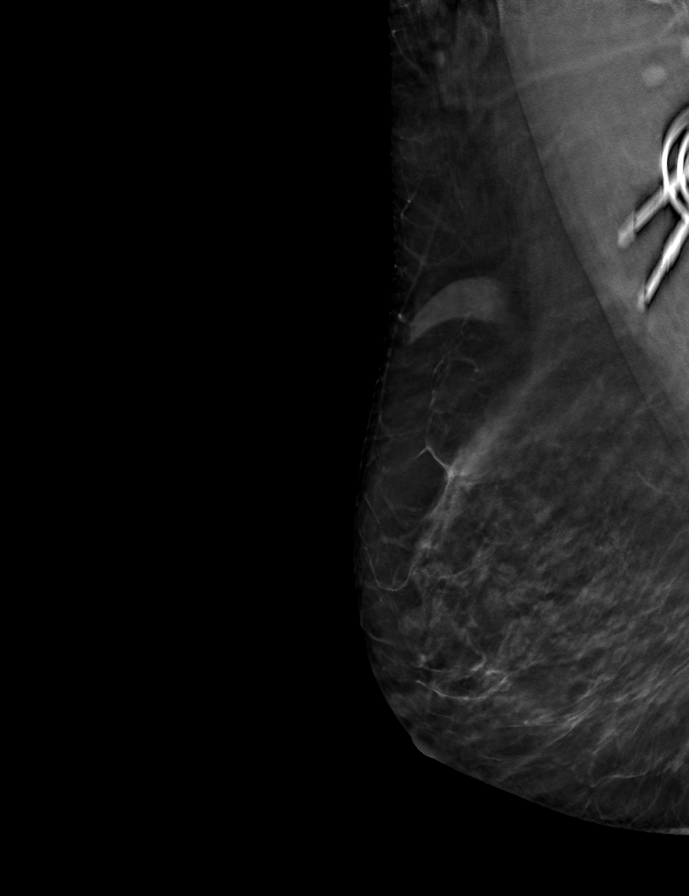

[L MLO tomo · tomo slice 39/78.0]
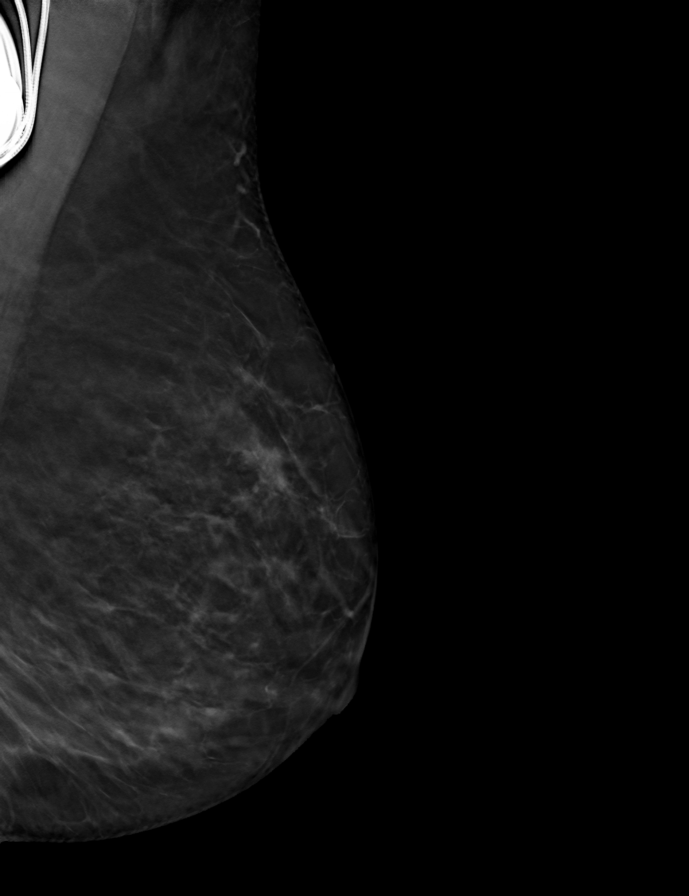

[R CC tomo · tomo slice 37/74.0]
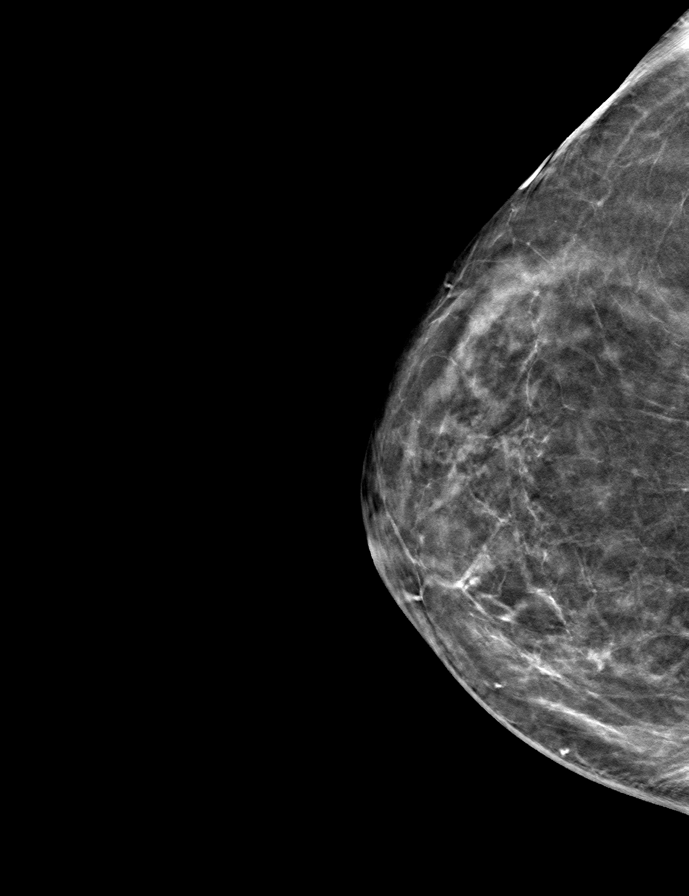

[9 of 24 positions shown; findings below may reference images not displayed]

ACR Breast Density Category b: There are scattered areas of
fibroglandular density.
FINDINGS: There are no findings suspicious for malignancy. The images were
evaluated with computer-aided detection.
IMPRESSION: No mammographic evidence of malignancy. A result letter of this
screening mammogram will be mailed directly to the patient.

RECOMMENDATION:
Screening mammogram in one year. (Code:[id number]-[id number])

BI-RADS CATEGORY  1: Negative.

## 2021-07-04 ENCOUNTER — Other Ambulatory Visit: Payer: Self-pay | Admitting: Family Medicine

## 2021-07-04 DIAGNOSIS — F411 Generalized anxiety disorder: Secondary | ICD-10-CM

## 2021-07-06 ENCOUNTER — Ambulatory Visit (INDEPENDENT_AMBULATORY_CARE_PROVIDER_SITE_OTHER): Payer: Medicare Other

## 2021-07-06 ENCOUNTER — Encounter: Payer: Self-pay | Admitting: Family Medicine

## 2021-07-06 ENCOUNTER — Ambulatory Visit (INDEPENDENT_AMBULATORY_CARE_PROVIDER_SITE_OTHER): Payer: Medicare Other | Admitting: Family Medicine

## 2021-07-06 VITALS — BP 133/68 | HR 83 | Temp 97.3°F | Ht 61.0 in | Wt 139.6 lb

## 2021-07-06 DIAGNOSIS — D229 Melanocytic nevi, unspecified: Secondary | ICD-10-CM | POA: Diagnosis not present

## 2021-07-06 DIAGNOSIS — Z79899 Other long term (current) drug therapy: Secondary | ICD-10-CM | POA: Diagnosis not present

## 2021-07-06 DIAGNOSIS — Z780 Asymptomatic menopausal state: Secondary | ICD-10-CM | POA: Diagnosis not present

## 2021-07-06 DIAGNOSIS — Z0001 Encounter for general adult medical examination with abnormal findings: Secondary | ICD-10-CM | POA: Diagnosis not present

## 2021-07-06 DIAGNOSIS — H6982 Other specified disorders of Eustachian tube, left ear: Secondary | ICD-10-CM

## 2021-07-06 DIAGNOSIS — M8580 Other specified disorders of bone density and structure, unspecified site: Secondary | ICD-10-CM | POA: Diagnosis not present

## 2021-07-06 DIAGNOSIS — E7800 Pure hypercholesterolemia, unspecified: Secondary | ICD-10-CM | POA: Diagnosis not present

## 2021-07-06 DIAGNOSIS — F410 Panic disorder [episodic paroxysmal anxiety] without agoraphobia: Secondary | ICD-10-CM | POA: Diagnosis not present

## 2021-07-06 DIAGNOSIS — I10 Essential (primary) hypertension: Secondary | ICD-10-CM

## 2021-07-06 DIAGNOSIS — M8588 Other specified disorders of bone density and structure, other site: Secondary | ICD-10-CM | POA: Diagnosis not present

## 2021-07-06 DIAGNOSIS — F411 Generalized anxiety disorder: Secondary | ICD-10-CM

## 2021-07-06 DIAGNOSIS — Z0000 Encounter for general adult medical examination without abnormal findings: Secondary | ICD-10-CM

## 2021-07-06 MED ORDER — LORAZEPAM 1 MG PO TABS: ORAL_TABLET | ORAL | 1 refills | Status: DC

## 2021-07-06 NOTE — Telephone Encounter (Signed)
Patient has appointment this morning.

## 2021-07-06 NOTE — Patient Instructions (Signed)
You had labs performed today.  You will be contacted with the results of the labs once they are available, usually in the next 3 business days for routine lab work.  If you have an active my chart account, they will be released to your MyChart.  If you prefer to have these labs released to you via telephone, please let us know.  If you had a pap smear or biopsy performed, expect to be contacted in about 7-10 days.  Eustachian Tube Dysfunction Eustachian tube dysfunction refers to a condition in which a blockage develops in the narrow passage that connects the middle ear to the back of the nose (eustachian tube). The eustachian tube regulates air pressure in the middle ear by letting air move between the ear and nose. It also helps to drain fluid from the middle ear space. Eustachian tube dysfunction can affect one or both ears. When the eustachian tube does not function properly, air pressure, fluid, or both can build up in the middle ear. What are the causes? This condition occurs when the eustachian tube becomes blocked or cannot open normally. Common causes of this condition include: Ear infections. Colds and other infections that affect the nose, mouth, and throat (upper respiratory tract). Allergies. Irritation from cigarette smoke. Irritation from stomach acid coming up into the esophagus (gastroesophageal reflux). The esophagus is the part of the body that moves food from the mouth to the stomach. Sudden changes in air pressure, such as from descending in an airplane or scuba diving. Abnormal growths in the nose or throat, such as: Growths that line the nose (nasal polyps). Abnormal growth of cells (tumors). Enlarged tissue at the back of the throat (adenoids). What increases the risk? You are more likely to develop this condition if: You smoke. You are overweight. You are a child who has: Certain birth defects of the mouth, such as cleft palate. Large tonsils or adenoids. What are  the signs or symptoms? Common symptoms of this condition include: A feeling of fullness in the ear. Ear pain. Clicking or popping noises in the ear. Ringing in the ear (tinnitus). Hearing loss. Loss of balance. Dizziness. Symptoms may get worse when the air pressure around you changes, such as when you travel to an area of high elevation, fly on an airplane, or go scuba diving. How is this diagnosed? This condition may be diagnosed based on: Your symptoms. A physical exam of your ears, nose, and throat. Tests, such as those that measure: The movement of your eardrum. Your hearing (audiometry). How is this treated? Treatment depends on the cause and severity of your condition. In mild cases, you may relieve your symptoms by moving air into your ears. This is called "popping the ears." In more severe cases, or if you have symptoms of fluid in your ears, treatment may include: Medicines to relieve congestion (decongestants). Medicines that treat allergies (antihistamines). Nasal sprays or ear drops that contain medicines that reduce swelling (steroids). A procedure to drain the fluid in your eardrum. In this procedure, a small tube may be placed in the eardrum to: Drain the fluid. Restore the air in the middle ear space. A procedure to insert a balloon device through the nose to inflate the opening of the eustachian tube (balloon dilation). Follow these instructions at home: Lifestyle Do not do any of the following until your health care provider approves: Travel to high altitudes. Fly in airplanes. Work in a Pension scheme manager or room. Scuba dive. Do not use any  products that contain nicotine or tobacco. These products include cigarettes, chewing tobacco, and vaping devices, such as e-cigarettes. If you need help quitting, ask your health care provider. Keep your ears dry. Wear fitted earplugs during showering and bathing. Dry your ears completely after. General instructions Take  over-the-counter and prescription medicines only as told by your health care provider. Use techniques to help pop your ears as recommended by your health care provider. These may include: Chewing gum. Yawning. Frequent, forceful swallowing. Closing your mouth, holding your nose closed, and gently blowing as if you are trying to blow air out of your nose. Keep all follow-up visits. This is important. Contact a health care provider if: Your symptoms do not go away after treatment. Your symptoms come back after treatment. You are unable to pop your ears. You have: A fever. Pain in your ear. Pain in your head or neck. Fluid draining from your ear. Your hearing suddenly changes. You become very dizzy. You lose your balance. Get help right away if: You have a sudden, severe increase in any of your symptoms. Summary Eustachian tube dysfunction refers to a condition in which a blockage develops in the eustachian tube. It can be caused by ear infections, allergies, inhaled irritants, or abnormal growths in the nose or throat. Symptoms may include ear pain or fullness, hearing loss, or ringing in the ears. Mild cases are treated with techniques to unblock the ears, such as yawning or chewing gum. More severe cases are treated with medicines or procedures. This information is not intended to replace advice given to you by your health care provider. Make sure you discuss any questions you have with your health care provider. Document Revised: 08/18/2020 Document Reviewed: 08/18/2020 Elsevier Patient Education  [place] 65 Years and Older, Female Preventive care refers to lifestyle choices and visits with your health care provider that can promote health and wellness. Preventive care visits are also called wellness exams. What can I expect for my preventive care visit? Counseling Your health care provider may ask you questions about your: Medical history,  including: Past medical problems. Family medical history. Pregnancy and menstrual history. History of falls. Current health, including: Memory and ability to understand (cognition). Emotional well-being. Home life and relationship well-being. Sexual activity and sexual health. Lifestyle, including: Alcohol, nicotine or tobacco, and drug use. Access to firearms. Diet, exercise, and sleep habits. Work and work Statistician. Sunscreen use. Safety issues such as seatbelt and bike helmet use. Physical exam Your health care provider will check your: Height and weight. These may be used to calculate your BMI (body mass index). BMI is a measurement that tells if you are at a healthy weight. Waist circumference. This measures the distance around your waistline. This measurement also tells if you are at a healthy weight and may help predict your risk of certain diseases, such as type 2 diabetes and high blood pressure. Heart rate and blood pressure. Body temperature. Skin for abnormal spots. What immunizations do I need? Vaccines are usually given at various ages, according to a schedule. Your health care provider will recommend vaccines for you based on your age, medical history, and lifestyle or other factors, such as travel or where you work. What tests do I need? Screening Your health care provider may recommend screening tests for certain conditions. This may include: Lipid and cholesterol levels. Hepatitis C test. Hepatitis B test. HIV (human immunodeficiency virus) test. STI (sexually transmitted infection) testing, if you are at  risk. Lung cancer screening. Colorectal cancer screening. Diabetes screening. This is done by checking your blood sugar (glucose) after you have not eaten for a while (fasting). Mammogram. Talk with your health care provider about how often you should have regular mammograms. BRCA-related cancer screening. This may be done if you have a family history of  breast, ovarian, tubal, or peritoneal cancers. Bone density scan. This is done to screen for osteoporosis. Talk with your health care provider about your test results, treatment options, and if necessary, the need for more tests. Follow these instructions at home: Eating and drinking  Eat a diet that includes fresh fruits and vegetables, whole grains, lean protein, and low-fat dairy products. Limit your intake of foods with high amounts of sugar, saturated fats, and salt. Take vitamin and mineral supplements as recommended by your health care provider. Do not drink alcohol if your health care provider tells you not to drink. If you drink alcohol: Limit how much you have to 0-1 drink a day. Know how much alcohol is in your drink. In the [place] one drink equals one 12 oz bottle of beer (355 mL), one 5 oz glass of wine (148 mL), or one 1 oz glass of hard liquor (44 mL). Lifestyle Brush your teeth every morning and night with fluoride toothpaste. Floss one time each day. Exercise for at least 30 minutes 5 or more days each week. Do not use any products that contain nicotine or tobacco. These products include cigarettes, chewing tobacco, and vaping devices, such as e-cigarettes. If you need help quitting, ask your health care provider. Do not use drugs. If you are sexually active, practice safe sex. Use a condom or other form of protection in order to prevent STIs. Take aspirin only as told by your health care provider. Make sure that you understand how much to take and what form to take. Work with your health care provider to find out whether it is safe and beneficial for you to take aspirin daily. Ask your health care provider if you need to take a cholesterol-lowering medicine (statin). Find healthy ways to manage stress, such as: Meditation, yoga, or listening to music. Journaling. Talking to a trusted person. Spending time with friends and family. Minimize exposure to UV radiation to reduce  your risk of skin cancer. Safety Always wear your seat belt while driving or riding in a vehicle. Do not drive: If you have been drinking alcohol. Do not ride with someone who has been drinking. When you are tired or distracted. While texting. If you have been using any mind-altering substances or drugs. Wear a helmet and other protective equipment during sports activities. If you have firearms in your house, make sure you follow all gun safety procedures. What's next? Visit your health care provider once a year for an annual wellness visit. Ask your health care provider how often you should have your eyes and teeth checked. Stay up to date on all vaccines. This information is not intended to replace advice given to you by your health care provider. Make sure you discuss any questions you have with your health care provider. Document Revised: 12/03/2020 Document Reviewed: 12/03/2020 Elsevier Patient Education  [place].

## 2021-07-06 NOTE — Progress Notes (Signed)
Carmen Green is a 71 y.o. female presents to office today for annual physical exam examination.    Concerns today include: 1.  Intermittent left ear pain Patient reports that she has been struggling with intermittent sharp left ear pain.  She is compliant with her Zyrtec, Flonase and Mucinex.  She maintains a relatively healthy lifestyle.  Denies any drainage, dizziness or bleeding.  No reports of hearing changes.  2.  Anxiety disorder Patient reports that she has been having some increased anxiety surrounding her husband's recent diagnosis of melanoma.  They have not seen the skin surgeon yet and are not sure what the next steps will be.  She continues to take her BuSpar, Ativan and Wellbutrin as prescribed.  Does not reporting excessive daytime sedation, falls or respiratory depression.  Occupation: Retired, Marital status: Married, Substance use: None Diet: Balanced, Exercise: Stays active Last eye exam: Needs to schedule Last dental exam: Needs to schedule Last colonoscopy: Up-to-date Last mammogram: Up-to-date Last pap smear: N/A Refills needed today: None Immunizations needed: Immunization History  Administered Date(s) Administered   Fluad Quad(high Dose 65+) 07/22/2020, 04/24/2021   Influenza, High Dose Seasonal PF 05/05/2016, 05/19/2017, 04/26/2018   Influenza,inj,Quad PF,6+ Mos 05/02/2019   Influenza-Unspecified 05/05/2016, 05/19/2017, 04/26/2018   Moderna Sars-Covid-2 Vaccination 08/20/2019, 09/17/2019, 07/15/2020   Pneumococcal Conjugate-13 01/03/2015   Pneumococcal Polysaccharide-23 12/21/2016   Tdap 02/17/2021   Zoster Recombinat (Shingrix) 12/04/2019     Past Medical History:  Diagnosis Date   Allergy    Anemia    Anxiety    Arthritis    Cataract    bil cataracts removed   Colon polyp    Complete heart block ([place])    Contusion of chest wall 10/10/2007   Centricity Description: CHEST WALL CONTUSION Qualifier: Diagnosis of  By: Aline Brochure MD, Dorothyann Peng    Centricity Description: CONTUSION, RIGHT RIB Qualifier: Diagnosis of  By: Aline Brochure MD, Stanley     Depression    Diverticulitis    Esophagitis    ESOPHAGITIS 06/25/2008   Qualifier: History of  By: Harlon Ditty CMA (AAMA), Dottie     GERD (gastroesophageal reflux disease)    H/O seasonal allergies    Heart murmur    Hiatal hernia    Hyperlipidemia    s/p PPM implant by Dr Olevia Perches 1992 with generator change 2004 ([facility]), she is device dependant   Hypertension    Mitral valve prolapse    Pacemaker    Social History   Socioeconomic History   Marital status: Married    Spouse name: Not on file   Number of children: 1   Years of education: Not on file   Highest education level: Not on file  Occupational History   Occupation: REGISTRATION   Occupation: retired  Tobacco Use   Smoking status: Never   Smokeless tobacco: Never  Vaping Use   Vaping Use: Never used  Substance and Sexual Activity   Alcohol use: No   Drug use: No   Sexual activity: Not on file  Other Topics Concern   Not on file  Social History Narrative   Lives with spouse in [place]   Social Determinants of Health   Financial Resource Strain: Not on file  Food Insecurity: Not on file  Transportation Needs: Not on file  Physical Activity: Not on file  Stress: Not on file  Social Connections: Not on file  Intimate Partner Violence: Not on file   Past Surgical History:  Procedure Laterality Date   APPENDECTOMY  BREAST EXCISIONAL BIOPSY Right 07/08/1998   BREAST SURGERY Right    biopsy   COLONOSCOPY  10/19/2004   [place]   EYE SURGERY Left    cataract extraction with IOL   LAPAROSCOPIC LOW ANTERIOR RESECTION  11/16/2013   Robotic   LAPAROSCOPIC LYSIS OF ADHESIONS N/A 11/15/2013   Procedure: LAPAROSCOPIC LYSIS OF ADHESIONS;  Surgeon: Adin Hector, MD;  Location: [facility];  Service: General;  Laterality: N/A;   left ovary cyst removal  [place] N/A 01/22/2020   Procedure: PACEMAKER IMPLANT;   Surgeon: Thompson Grayer, MD;  Location: [facility];  Service: Cardiovascular;  Laterality: N/A;   PACEMAKER INSERTION  1992, 2004, 2012   device dependant   POLYPECTOMY     PROCTOSCOPY  11/15/2013   Procedure: RIGID PROCTOSCOPY;  Surgeon: Adin Hector, MD;  Location: [facility];  Service: General;;   ROBOTIC ASSISTED SALPINGO OOPHERECTOMY Left 11/16/2013   Robotic en bloc w LAR resection   TUBAL LIGATION     UPPER GASTROINTESTINAL ENDOSCOPY     Family History  Problem Relation Age of Onset   Diabetes Mother    Stroke Mother    Diabetes Father    Lung cancer Father    Heart disease Maternal Aunt    Breast cancer Maternal Aunt    Heart disease Maternal Grandmother    Lung cancer Maternal Grandfather    Colon cancer Neg Hx    Esophageal cancer Neg Hx    Pancreatic cancer Neg Hx    Rectal cancer Neg Hx    Stomach cancer Neg Hx     Current Outpatient Medications:    Ascorbic Acid (VITAMIN C) 1000 MG tablet, Take 1,000 mg by mouth daily., Disp: , Rfl:    buPROPion (WELLBUTRIN XL) 300 MG 24 hr tablet, Take 1 tablet (300 mg total) by mouth daily., Disp: 90 tablet, Rfl: 3   busPIRone (BUSPAR) 15 MG tablet, TAKE  (1)  TABLET  THREE TIMES DAILY., Disp: 270 tablet, Rfl: 3   cetirizine (ZYRTEC) 10 MG tablet, Take 10 mg by mouth daily as needed for allergies., Disp: , Rfl:    cholecalciferol (VITAMIN D) 25 MCG (1000 UNIT) tablet, Take 1,000 Units by mouth daily. , Disp: , Rfl:    D-MANNOSE PO, Take 1 tablet by mouth daily., Disp: , Rfl:    esomeprazole (NEXIUM) 40 MG capsule, TAKE 1 CAPSULE ONCE DAILY AT NOON, Disp: 90 capsule, Rfl: 3   estradiol (ESTRACE) 0.1 MG/GM vaginal cream, INSERT 1 GRAM VAGINALLY AT BEDTIME FOR 2 WEEKS, THEN TWICE A WEEK THEREAFTER, Disp: , Rfl:    fluticasone (FLONASE) 50 MCG/ACT nasal spray, 1 SPRAY IN EACH NOSTRIL DAILY AS NEEDED FOR ALLERGIES, Disp: 16 g, Rfl: 3   guaiFENesin (MUCINEX) 600 MG 12 hr tablet, Take 600 mg by mouth 2 (two) times daily as needed for  cough., Disp: , Rfl:    lisinopril-hydrochlorothiazide (ZESTORETIC) 20-12.5 MG tablet, TAKE (1) TABLET DAILY IN THE MORNING., Disp: 90 tablet, Rfl: 3   LORazepam (ATIVAN) 1 MG tablet, Take 1 tablet every night at bedtime.  May take 1/2 tablet during daytime if needed., Disp: 135 tablet, Rfl: 1   meclizine (ANTIVERT) 25 MG tablet, Take 1 tablet (25 mg total) by mouth 3 (three) times daily as needed for dizziness., Disp: 30 tablet, Rfl: 0   medroxyPROGESTERone (PROVERA) 2.5 MG tablet, Take 1 tablet (2.5 mg total) by mouth daily., Disp: 90 tablet, Rfl: 1   Multiple Vitamin (MULTIVITAMIN) tablet,  Take 1 tablet by mouth daily.  , Disp: , Rfl:    nitrofurantoin (MACRODANTIN) 50 MG capsule, Take 50 mg by mouth 4 (four) times daily., Disp: , Rfl:    nitrofurantoin, macrocrystal-monohydrate, (MACROBID) 100 MG capsule, Take 1 capsule (100 mg total) by mouth 2 (two) times daily. 1 po BId, Disp: 14 capsule, Rfl: 0   polyethylene glycol powder (GLYCOLAX/MIRALAX) powder, Take 1 Container by mouth daily as needed for mild constipation. , Disp: , Rfl:    trolamine salicylate (ASPERCREME) 10 % cream, Apply 1 application topically as needed for muscle pain., Disp: , Rfl:    Zinc Sulfate (ZINC 15 PO), Take by mouth., Disp: , Rfl:   Allergies  Allergen Reactions   Zoloft [Sertraline Hcl] Nausea Only        Amoxapine And Related Rash   Cephalexin Rash   Oxycodone     "Loopy"   Penicillins Rash     ROS: Review of Systems Pertinent items noted in HPI and remainder of comprehensive ROS otherwise negative.    Physical exam BP 133/68    Pulse 83    Temp (!) 97.3 F (36.3 C)    Ht 5' 1" (1.549 m)    Wt 139 lb 9.6 oz (63.3 kg)    SpO2 100%    BMI 26.38 kg/m  General appearance: alert, cooperative, appears stated age, and no distress Head: Normocephalic, without obvious abnormality, atraumatic Eyes: negative findings: lids and lashes normal, conjunctivae and sclerae normal, corneas clear, and pupils equal,  round, reactive to light and accomodation Ears:  TMs with normal light reflex.  She has a slight clear effusion noted on the left.  No associated erythema or bulging of the tympanic membrane Nose: Nares normal. Septum midline. Mucosa normal. No drainage or sinus tenderness. Throat: lips, mucosa, and tongue normal; teeth and gums normal Neck: no adenopathy, supple, symmetrical, trachea midline, and thyroid not enlarged, symmetric, no tenderness/mass/nodules Back: symmetric, no curvature. ROM normal. No CVA tenderness. Lungs: clear to auscultation bilaterally Heart: regular rate and rhythm, S1, S2 normal, no murmur, click, rub or gallop Abdomen: soft, non-tender; bowel sounds normal; no masses,  no organomegaly Extremities: extremities normal, atraumatic, no cyanosis or edema Pulses: 2+ and symmetric Skin:  Multiple pigmented nevi throughout the trunk, lower extremities and upper extremities.  She has a flesh-colored, raised, scaly lesion along the left lateral lower leg.  No appreciable hypervascularity, central umbilication or bleeding Lymph nodes: Cervical, supraclavicular, and axillary nodes normal. Neurologic: Grossly normal Psych: Appears a little more stressed than her baseline. Depression screen [facility] 2/9 07/06/2021 06/01/2021 01/07/2021  Decreased Interest 1 0 0  Down, Depressed, Hopeless 0 0 0  PHQ - 2 Score 1 0 0  Altered sleeping 0 - -  Tired, decreased energy 0 - -  Change in appetite 0 - -  Feeling bad or failure about yourself  0 - -  Trouble concentrating 1 - -  Moving slowly or fidgety/restless 0 - -  Suicidal thoughts 0 - -  PHQ-9 Score 2 - -  Some recent data might be hidden   GAD 7 : Generalized Anxiety Score 07/06/2021 01/05/2021 07/15/2020 12/04/2019  Nervous, Anxious, on Edge 1 0 1 2  Control/stop worrying 1 0 2 3  Worry too much - different things 0 0 2 3  Trouble relaxing 1 0 3 3  Restless 0 0 1 1  Easily annoyed or irritable 0 0 1 1  Afraid - awful might happen 0 0  1  2  Total GAD 7 Score 3 0 11 15  Anxiety Difficulty Not difficult at all Not difficult at all Somewhat difficult Very difficult   Assessment/ Plan: Carmen Green here for annual physical exam.   Annual physical exam  Multiple pigmented nevi  Generalized anxiety disorder with panic attacks - Plan: LORazepam (ATIVAN) 1 MG tablet, TSH  Chronic prescription benzodiazepine use  Essential hypertension - Plan: CMP14+EGFR  Pure hypercholesterolemia - Plan: Lipid Panel, TSH, CMP14+EGFR  Osteopenia after menopause - Plan: CBC, DG [facility] DEXA  Dysfunction of left eustachian tube  Up-to-date on preventive healthcare except for her DEXA scan, which was ordered today.  I encouraged her to schedule an appointment with her dermatologist for full body exam.  She has multiple pigmented nevi and a lesion on the left lateral leg that seems consistent with a seborrheic keratosis but could represent actinic keratosis.  Her anxiety has been exacerbated as of late due to her husband's recent diagnosis of melanoma.  She will continue current regimen of medications.  Her Ativan has been renewed.  She is up-to-date on controlled substance contract and urine drug screen.  National narcotic database was reviewed and there were no red flags  Blood pressure controlled upon recheck.  No changes.  Check CMP  Has not had lipid panel done in a couple of years but last lipid panel showed elevated LDL.  Check fasting lipid, TSH and CMP today  Her left ear did show a slight effusion.  Suspect eustachian tube dysfunction.  She will continue current medications but I did demonstrate osteopathic maneuver today for eustachian tube drainage.  We will have her follow-up in the next 4 to 6 months, sooner if concerns arise   M. Lajuana Ripple, DO

## 2021-07-07 DIAGNOSIS — M8589 Other specified disorders of bone density and structure, multiple sites: Secondary | ICD-10-CM | POA: Diagnosis not present

## 2021-07-07 DIAGNOSIS — Z780 Asymptomatic menopausal state: Secondary | ICD-10-CM | POA: Diagnosis not present

## 2021-07-07 LAB — CBC
Hematocrit: 39.2 % (ref 34.0–46.6)
Hemoglobin: 13.5 g/dL (ref 11.1–15.9)
MCH: 32.3 pg (ref 26.6–33.0)
MCHC: 34.4 g/dL (ref 31.5–35.7)
MCV: 94 fL (ref 79–97)
Platelets: 261 10*3/uL (ref 150–450)
RBC: 4.18 x10E6/uL (ref 3.77–5.28)
RDW: 11.2 % — ABNORMAL LOW (ref 11.7–15.4)
WBC: 3.6 10*3/uL (ref 3.4–10.8)

## 2021-07-07 LAB — LIPID PANEL
Chol/HDL Ratio: 3.6 ratio (ref 0.0–4.4)
Cholesterol, Total: 202 mg/dL — ABNORMAL HIGH (ref 100–199)
HDL: 56 mg/dL (ref >39)
LDL Chol Calc (NIH): 132 mg/dL — ABNORMAL HIGH (ref 0–99)
Triglycerides: 75 mg/dL (ref 0–149)
VLDL Cholesterol Cal: 14 mg/dL (ref 5–40)

## 2021-07-07 LAB — CMP14+EGFR
ALT: 13 IU/L (ref 0–32)
AST: 23 IU/L (ref 0–40)
Albumin/Globulin Ratio: 1.9 (ref 1.2–2.2)
Albumin: 4.8 g/dL — ABNORMAL HIGH (ref 3.7–4.7)
Alkaline Phosphatase: 74 IU/L (ref 44–121)
BUN/Creatinine Ratio: 11 — ABNORMAL LOW (ref 12–28)
BUN: 10 mg/dL (ref 8–27)
Bilirubin Total: 0.5 mg/dL (ref 0.0–1.2)
CO2: 23 mmol/L (ref 20–29)
Calcium: 9.6 mg/dL (ref 8.7–10.3)
Chloride: 99 mmol/L (ref 96–106)
Creatinine, Ser: 0.93 mg/dL (ref 0.57–1.00)
Globulin, Total: 2.5 g/dL (ref 1.5–4.5)
Glucose: 103 mg/dL — ABNORMAL HIGH (ref 70–99)
Potassium: 4.7 mmol/L (ref 3.5–5.2)
Sodium: 135 mmol/L (ref 134–144)
Total Protein: 7.3 g/dL (ref 6.0–8.5)
eGFR: 65 mL/min/{1.73_m2} (ref >59)

## 2021-07-07 LAB — TSH: TSH: 0.849 u[IU]/mL (ref 0.450–4.500)

## 2021-07-07 MED ORDER — ROSUVASTATIN CALCIUM 10 MG PO TABS: 10.0000 mg | ORAL_TABLET | Freq: Every day | ORAL | 1 refills | Status: DC

## 2021-07-07 NOTE — Addendum Note (Signed)
Addended byCarrolyn Leigh on: 07/07/2021 11:52 AM   Modules accepted: Orders

## 2021-07-22 ENCOUNTER — Ambulatory Visit (INDEPENDENT_AMBULATORY_CARE_PROVIDER_SITE_OTHER): Payer: Medicare Other

## 2021-07-22 DIAGNOSIS — I442 Atrioventricular block, complete: Secondary | ICD-10-CM

## 2021-07-22 LAB — CUP PACEART REMOTE DEVICE CHECK
Battery Remaining Longevity: 109 mo
Battery Remaining Percentage: 88 %
Battery Voltage: 3.01 V
Brady Statistic AP VP Percent: 7 %
Brady Statistic AP VS Percent: 1 %
Brady Statistic AS VP Percent: 90 %
Brady Statistic AS VS Percent: 1.4 %
Brady Statistic RA Percent Paced: 5.4 %
Brady Statistic RV Percent Paced: 97 %
Date Time Interrogation Session: 20230201033456
Implantable Lead Implant Date: 20210803
Implantable Lead Implant Date: 20210803
Implantable Lead Location: 753859
Implantable Lead Location: 753860
Implantable Pulse Generator Implant Date: 20210803
Lead Channel Impedance Value: 400 Ohm
Lead Channel Impedance Value: 450 Ohm
Lead Channel Pacing Threshold Amplitude: 0.75 V
Lead Channel Pacing Threshold Amplitude: 0.75 V
Lead Channel Pacing Threshold Pulse Width: 0.4 ms
Lead Channel Pacing Threshold Pulse Width: 0.5 ms
Lead Channel Sensing Intrinsic Amplitude: 2.4 mV
Lead Channel Sensing Intrinsic Amplitude: 7.3 mV
Lead Channel Setting Pacing Amplitude: 1 V
Lead Channel Setting Pacing Amplitude: 2 V
Lead Channel Setting Pacing Pulse Width: 0.5 ms
Lead Channel Setting Sensing Sensitivity: 4 mV
Pulse Gen Model: 2272
Pulse Gen Serial Number: 3852139

## 2021-07-29 NOTE — Progress Notes (Signed)
Remote pacemaker transmission.   

## 2021-08-18 ENCOUNTER — Other Ambulatory Visit: Payer: Self-pay | Admitting: Family Medicine

## 2021-08-18 DIAGNOSIS — Z780 Asymptomatic menopausal state: Secondary | ICD-10-CM

## 2021-09-30 ENCOUNTER — Encounter: Payer: Self-pay | Admitting: Family Medicine

## 2021-09-30 ENCOUNTER — Ambulatory Visit (INDEPENDENT_AMBULATORY_CARE_PROVIDER_SITE_OTHER): Payer: Medicare Other | Admitting: Family Medicine

## 2021-09-30 VITALS — BP 136/78 | HR 81 | Temp 98.3°F | Ht 61.0 in | Wt 139.4 lb

## 2021-09-30 DIAGNOSIS — J301 Allergic rhinitis due to pollen: Secondary | ICD-10-CM | POA: Diagnosis not present

## 2021-09-30 DIAGNOSIS — M7582 Other shoulder lesions, left shoulder: Secondary | ICD-10-CM

## 2021-09-30 DIAGNOSIS — E7800 Pure hypercholesterolemia, unspecified: Secondary | ICD-10-CM

## 2021-09-30 MED ORDER — LEVOCETIRIZINE DIHYDROCHLORIDE 5 MG PO TABS: 5.0000 mg | ORAL_TABLET | Freq: Every evening | ORAL | 3 refills | Status: DC

## 2021-09-30 MED ORDER — NAPROXEN 375 MG PO TABS: 375.0000 mg | ORAL_TABLET | Freq: Two times a day (BID) | ORAL | 0 refills | Status: DC | PRN

## 2021-09-30 NOTE — Progress Notes (Signed)
? ?Subjective: ?CC:HLD ?PCP: Carmen Norlander, DO ?RAQ:TMAUQJ Carmen Green is a 71 y.o. female presenting to clinic today for: ? ?1. HLD ?Crestor '10mg'$  started in January for ASCVD score 16.6%.  She is tolerating the medication without difficulty.  Denies any chest pain, abdominal pain, jaundice. ? ?2.  Left shoulder pain ?Patient reports that she has been having some left arm and left shoulder pain.  It seems to be hurting when she goes to raise that left upper extremity.  She does not report any sensory changes or preceding injury.  Utilizing Tylenol arthritis 1-2 times per day.  This does not seem to be keeping her pain under control however ? ?3.  Allergic rhinitis ?Patient would like to be started on an alternative allergy medicine.  She is been utilizing Zyrtec and that used to work but it seems not to work anymore. ? ?ROS: Per HPI ? ?Allergies  ?Allergen Reactions  ? Zoloft [Sertraline Hcl] Nausea Only  ?   ?  ? Amoxapine And Related Rash  ? Cephalexin Rash  ? Oxycodone   ?  "Loopy"  ? Penicillins Rash  ? ?Past Medical History:  ?Diagnosis Date  ? Allergy   ? Anemia   ? Anxiety   ? Arthritis   ? Cataract   ? bil cataracts removed  ? Colon polyp   ? Complete heart block ([place])   ? Contusion of chest wall 10/10/2007  ? Centricity Description: CHEST WALL CONTUSION Qualifier: Diagnosis of  By: Aline Brochure MD, Dorothyann Peng   Centricity Description: CONTUSION, RIGHT RIB Qualifier: Diagnosis of  By: Aline Brochure MD, Dorothyann Peng    ? Depression   ? Diverticulitis   ? Esophagitis   ? ESOPHAGITIS 06/25/2008  ? Qualifier: History of  By: Nelson-Smith CMA (AAMA), Dottie    ? GERD (gastroesophageal reflux disease)   ? H/O seasonal allergies   ? Heart murmur   ? Hiatal hernia   ? Hyperlipidemia   ? s/p PPM implant by Dr Olevia Perches 1992 with generator change 2004 ([facility]), she is device dependant  ? Hypertension   ? Mitral valve prolapse   ? Pacemaker   ? ? ?Current Outpatient Medications:  ?  Ascorbic Acid (VITAMIN C) 1000 MG tablet, Take 1,000  mg by mouth daily., Disp: , Rfl:  ?  buPROPion (WELLBUTRIN XL) 300 MG 24 hr tablet, Take 1 tablet (300 mg total) by mouth daily., Disp: 90 tablet, Rfl: 3 ?  busPIRone (BUSPAR) 15 MG tablet, TAKE  (1)  TABLET  THREE TIMES DAILY., Disp: 270 tablet, Rfl: 3 ?  cetirizine (ZYRTEC) 10 MG tablet, Take 10 mg by mouth daily as needed for allergies., Disp: , Rfl:  ?  cholecalciferol (VITAMIN D) 25 MCG (1000 UNIT) tablet, Take 1,000 Units by mouth daily. , Disp: , Rfl:  ?  D-MANNOSE PO, Take 1 tablet by mouth daily., Disp: , Rfl:  ?  esomeprazole (NEXIUM) 40 MG capsule, TAKE 1 CAPSULE ONCE DAILY AT NOON, Disp: 90 capsule, Rfl: 3 ?  estradiol (ESTRACE) 0.1 MG/GM vaginal cream, INSERT 1 GRAM VAGINALLY AT BEDTIME FOR 2 WEEKS, THEN TWICE A WEEK THEREAFTER, Disp: , Rfl:  ?  fluticasone (FLONASE) 50 MCG/ACT nasal spray, 1 SPRAY IN EACH NOSTRIL DAILY AS NEEDED FOR ALLERGIES, Disp: 16 g, Rfl: 3 ?  guaiFENesin (MUCINEX) 600 MG 12 hr tablet, Take 600 mg by mouth 2 (two) times daily as needed for cough., Disp: , Rfl:  ?  lisinopril-hydrochlorothiazide (ZESTORETIC) 20-12.5 MG tablet, TAKE (1) TABLET DAILY IN THE  MORNING., Disp: 90 tablet, Rfl: 3 ?  LORazepam (ATIVAN) 1 MG tablet, Take 1 tablet every night at bedtime.  May take 1/2 tablet during daytime if needed., Disp: 135 tablet, Rfl: 1 ?  meclizine (ANTIVERT) 25 MG tablet, Take 1 tablet (25 mg total) by mouth 3 (three) times daily as needed for dizziness., Disp: 30 tablet, Rfl: 0 ?  medroxyPROGESTERone (PROVERA) 2.5 MG tablet, TAKE ONE TABLET ONCE DAILY, Disp: 90 tablet, Rfl: 0 ?  Multiple Vitamin (MULTIVITAMIN) tablet, Take 1 tablet by mouth daily.  , Disp: , Rfl:  ?  nitrofurantoin (MACRODANTIN) 50 MG capsule, Take 50 mg by mouth 4 (four) times daily., Disp: , Rfl:  ?  polyethylene glycol powder (GLYCOLAX/MIRALAX) powder, Take 1 Container by mouth daily as needed for mild constipation. , Disp: , Rfl:  ?  rosuvastatin (CRESTOR) 10 MG tablet, Take 1 tablet (10 mg total) by mouth  daily., Disp: 90 tablet, Rfl: 1 ?  trolamine salicylate (ASPERCREME) 10 % cream, Apply 1 application topically as needed for muscle pain., Disp: , Rfl:  ?  Zinc Sulfate (ZINC 15 PO), Take by mouth., Disp: , Rfl:  ?Social History  ? ?Socioeconomic History  ? Marital status: Married  ?  Spouse name: Not on file  ? Number of children: 1  ? Years of education: Not on file  ? Highest education level: Not on file  ?Occupational History  ? Occupation: REGISTRATION  ? Occupation: retired  ?Tobacco Use  ? Smoking status: Never  ? Smokeless tobacco: Never  ?Vaping Use  ? Vaping Use: Never used  ?Substance and Sexual Activity  ? Alcohol use: No  ? Drug use: No  ? Sexual activity: Not on file  ?Other Topics Concern  ? Not on file  ?Social History Narrative  ? Lives with spouse in [place]  ? ?Social Determinants of Health  ? ?Financial Resource Strain: Not on file  ?Food Insecurity: Not on file  ?Transportation Needs: Not on file  ?Physical Activity: Not on file  ?Stress: Not on file  ?Social Connections: Not on file  ?Intimate Partner Violence: Not on file  ? ?Family History  ?Problem Relation Age of Onset  ? Diabetes Mother   ? Stroke Mother   ? Diabetes Father   ? Lung cancer Father   ? Heart disease Maternal Aunt   ? Breast cancer Maternal Aunt   ? Heart disease Maternal Grandmother   ? Lung cancer Maternal Grandfather   ? Colon cancer Neg Hx   ? Esophageal cancer Neg Hx   ? Pancreatic cancer Neg Hx   ? Rectal cancer Neg Hx   ? Stomach cancer Neg Hx   ? ? ?Objective: ?Office vital signs reviewed. ?BP 136/78   Pulse 81   Temp 98.3 ?F (36.8 ?C)   Ht '5\' 1"'$  (1.549 m)   Wt 139 lb 6.4 oz (63.2 kg)   SpO2 100%   BMI 26.34 kg/m?  ? ?Physical Examination:  ?General: Awake, alert, well nourished, No acute distress ?HEENT: Sclera white.  Moist mucous membranes.  No gross rhinorrhea ?Cardio: regular rate and rhythm, S1S2 heard, no murmurs appreciated ?Pulm: clear to auscultation bilaterally, no wheezes, rhonchi or rales; normal  work of breathing on room air ?MSK:  ? Left shoulder: Painful arc sign present.  Limited active range of motion in internal rotation and AB duction.  She has pain with empty can but no weakness.  No weakness with Napoleon sign. ? ?Assessment/ Plan: ?71 y.o. female  ? ?  Pure hypercholesterolemia - Plan: Hepatic function panel, Lipid Panel ? ?Seasonal allergic rhinitis due to pollen - Plan: levocetirizine (XYZAL) 5 MG tablet ? ?Tendinitis of left rotator cuff - Plan: naproxen (NAPROSYN) 375 MG tablet ? ?Check fasting lipid, liver function test.  Continue statin for now ? ?Xyzal sent to help with allergic symptoms. ? ?Sounds like a tendinitis of that left rotator cuff.  She did not have any weakness on exam to suggest a tear.  Differential diagnosis includes bursitis.  I have placed her on Naprosyn twice daily.  Avoid other NSAIDs.  Okay to use Tylenol if she finds this helpful or topical analgesic of choice.  Home physical therapy exercises discussed and handout provided.  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 ?No orders of the defined types were placed in this encounter. ? ?No orders of the defined types were placed in this encounter. ? ? ? ?Carmen Norlander, DO ?[place] ?[phone] ? ?

## 2021-10-01 LAB — LIPID PANEL
Chol/HDL Ratio: 2.8 ratio (ref 0.0–4.4)
Cholesterol, Total: 143 mg/dL (ref 100–199)
HDL: 52 mg/dL (ref >39)
LDL Chol Calc (NIH): 79 mg/dL (ref 0–99)
Triglycerides: 57 mg/dL (ref 0–149)
VLDL Cholesterol Cal: 12 mg/dL (ref 5–40)

## 2021-10-01 LAB — HEPATIC FUNCTION PANEL
ALT: 18 IU/L (ref 0–32)
AST: 26 IU/L (ref 0–40)
Albumin: 4.7 g/dL (ref 3.7–4.7)
Alkaline Phosphatase: 73 IU/L (ref 44–121)
Bilirubin Total: 0.4 mg/dL (ref 0.0–1.2)
Bilirubin, Direct: 0.13 mg/dL (ref 0.00–0.40)
Total Protein: 7.1 g/dL (ref 6.0–8.5)

## 2021-10-08 DIAGNOSIS — H10023 Other mucopurulent conjunctivitis, bilateral: Secondary | ICD-10-CM | POA: Diagnosis not present

## 2021-10-15 ENCOUNTER — Other Ambulatory Visit: Payer: Self-pay | Admitting: Family Medicine

## 2021-10-15 DIAGNOSIS — Z1231 Encounter for screening mammogram for malignant neoplasm of breast: Secondary | ICD-10-CM

## 2021-10-16 DIAGNOSIS — N3020 Other chronic cystitis without hematuria: Secondary | ICD-10-CM | POA: Diagnosis not present

## 2021-10-16 DIAGNOSIS — N952 Postmenopausal atrophic vaginitis: Secondary | ICD-10-CM | POA: Diagnosis not present

## 2021-10-21 ENCOUNTER — Ambulatory Visit (INDEPENDENT_AMBULATORY_CARE_PROVIDER_SITE_OTHER): Payer: Medicare Other

## 2021-10-21 DIAGNOSIS — I442 Atrioventricular block, complete: Secondary | ICD-10-CM | POA: Diagnosis not present

## 2021-10-21 LAB — CUP PACEART REMOTE DEVICE CHECK
Battery Remaining Longevity: 105 mo
Battery Remaining Percentage: 86 %
Battery Voltage: 3.01 V
Brady Statistic AP VP Percent: 6.5 %
Brady Statistic AP VS Percent: 1 %
Brady Statistic AS VP Percent: 90 %
Brady Statistic AS VS Percent: 1.6 %
Brady Statistic RA Percent Paced: 4.9 %
Brady Statistic RV Percent Paced: 96 %
Date Time Interrogation Session: 20230503051828
Implantable Lead Implant Date: 20210803
Implantable Lead Implant Date: 20210803
Implantable Lead Location: 753859
Implantable Lead Location: 753860
Implantable Pulse Generator Implant Date: 20210803
Lead Channel Impedance Value: 410 Ohm
Lead Channel Impedance Value: 460 Ohm
Lead Channel Pacing Threshold Amplitude: 0.75 V
Lead Channel Pacing Threshold Amplitude: 0.875 V
Lead Channel Pacing Threshold Pulse Width: 0.4 ms
Lead Channel Pacing Threshold Pulse Width: 0.5 ms
Lead Channel Sensing Intrinsic Amplitude: 2.7 mV
Lead Channel Sensing Intrinsic Amplitude: 5.8 mV
Lead Channel Setting Pacing Amplitude: 1.125
Lead Channel Setting Pacing Amplitude: 2 V
Lead Channel Setting Pacing Pulse Width: 0.5 ms
Lead Channel Setting Sensing Sensitivity: 4 mV
Pulse Gen Model: 2272
Pulse Gen Serial Number: 3852139

## 2021-10-30 ENCOUNTER — Other Ambulatory Visit: Payer: Self-pay | Admitting: Family Medicine

## 2021-10-30 DIAGNOSIS — J301 Allergic rhinitis due to pollen: Secondary | ICD-10-CM

## 2021-11-04 NOTE — Progress Notes (Signed)
Remote pacemaker transmission.   

## 2021-11-17 ENCOUNTER — Encounter: Payer: Self-pay | Admitting: Family Medicine

## 2021-11-17 ENCOUNTER — Ambulatory Visit (INDEPENDENT_AMBULATORY_CARE_PROVIDER_SITE_OTHER): Payer: Medicare Other | Admitting: Family Medicine

## 2021-11-17 DIAGNOSIS — R399 Unspecified symptoms and signs involving the genitourinary system: Secondary | ICD-10-CM | POA: Diagnosis not present

## 2021-11-17 DIAGNOSIS — N3001 Acute cystitis with hematuria: Secondary | ICD-10-CM

## 2021-11-17 LAB — URINALYSIS, ROUTINE W REFLEX MICROSCOPIC
Bilirubin, UA: NEGATIVE
Glucose, UA: NEGATIVE
Nitrite, UA: NEGATIVE
Protein,UA: NEGATIVE
Specific Gravity, UA: 1.01 (ref 1.005–1.030)
Urobilinogen, Ur: 0.2 mg/dL (ref 0.2–1.0)
pH, UA: 6 (ref 5.0–7.5)

## 2021-11-17 LAB — MICROSCOPIC EXAMINATION: Renal Epithel, UA: NONE SEEN /hpf

## 2021-11-17 MED ORDER — CIPROFLOXACIN HCL 500 MG PO TABS: 500.0000 mg | ORAL_TABLET | Freq: Two times a day (BID) | ORAL | 0 refills | Status: AC

## 2021-11-17 NOTE — Progress Notes (Signed)
Virtual Visit via telephone Note Due to COVID-19 pandemic this visit was conducted virtually. This visit type was conducted due to national recommendations for restrictions regarding the COVID-19 Pandemic (e.g. social distancing, sheltering in place) in an effort to limit this patient's exposure and mitigate transmission in our community. All issues noted in this document were discussed and addressed.  A physical exam was not performed with this format.   I connected with Carmen Green on 11/17/2021 at 0930 by telephone and verified that I am speaking with the correct person using two identifiers. Carmen Green is currently located at home and patient is currently with them during visit. The provider, Monia Pouch, FNP is located in their office at time of visit.  I discussed the limitations, risks, security and privacy concerns of performing an evaluation and management service by virtual visit and the availability of in person appointments. I also discussed with the patient that there may be a patient responsible charge related to this service. The patient expressed understanding and agreed to proceed.  Subjective:  Patient ID: Carmen Green, female    DOB: [birth year], 71 y.o.   MRN: [id number]  Chief Complaint:  Dysuria   HPI: Carmen Green is a 71 y.o. female presenting on 11/17/2021 for Dysuria   Pt reports lower abdominal pressure and dysuria which started today. She has frequency and urgency as well. No fever, chills, weakness, confusion, or flank pain. No hematuria. She does have recurrent UTIs and is followed by Urology. She is currently on prophylactic macrobid daily and has not had a UTI since 05/2021.   Dysuria  This is a new problem. The current episode started today. The problem occurs every urination. The problem has been gradually worsening. The quality of the pain is described as burning and aching. The pain is mild. There has been no fever. She is Not sexually  active. There is No history of pyelonephritis. Associated symptoms include frequency, hesitancy and urgency. Pertinent negatives include no chills, discharge, flank pain, hematuria, nausea, possible pregnancy, sweats or vomiting. She has tried increased fluids and antibiotics for the symptoms. The treatment provided no relief. Her past medical history is significant for recurrent UTIs.    Relevant past medical, surgical, family, and social history reviewed and updated as indicated.  Allergies and medications reviewed and updated.   Past Medical History:  Diagnosis Date   Allergy    Anemia    Anxiety    Arthritis    Cataract    bil cataracts removed   Colon polyp    Complete heart block ([facility])    Contusion of chest wall 10/10/2007   Centricity Description: CHEST WALL CONTUSION Qualifier: Diagnosis of  By: Aline Brochure MD, Dorothyann Peng   Centricity Description: CONTUSION, RIGHT RIB Qualifier: Diagnosis of  By: Aline Brochure MD, Stanley     Depression    Diverticulitis    Esophagitis    ESOPHAGITIS 06/25/2008   Qualifier: History of  By: Harlon Ditty CMA (AAMA), Dottie     GERD (gastroesophageal reflux disease)    H/O seasonal allergies    Heart murmur    Hiatal hernia    Hyperlipidemia    s/p PPM implant by Dr Olevia Perches 1992 with generator change 2004 ([facility]), she is device dependant   Hypertension    Mitral valve prolapse    Pacemaker     Past Surgical History:  Procedure Laterality Date   APPENDECTOMY     BREAST EXCISIONAL BIOPSY Right 07/08/1998   BREAST SURGERY Right  biopsy   COLONOSCOPY  10/19/2004   [place]   EYE SURGERY Left    cataract extraction with IOL   LAPAROSCOPIC LOW ANTERIOR RESECTION  11/16/2013   Robotic   LAPAROSCOPIC LYSIS OF ADHESIONS N/A 11/15/2013   Procedure: LAPAROSCOPIC LYSIS OF ADHESIONS;  Surgeon: Adin Hector, MD;  Location: [facility];  Service: General;  Laterality: N/A;   left ovary cyst removal  [place] N/A 01/22/2020   Procedure: PACEMAKER  IMPLANT;  Surgeon: Thompson Grayer, MD;  Location: [facility];  Service: Cardiovascular;  Laterality: N/A;   PACEMAKER INSERTION  1992, 2004, 2012   device dependant   POLYPECTOMY     PROCTOSCOPY  11/15/2013   Procedure: RIGID PROCTOSCOPY;  Surgeon: Adin Hector, MD;  Location: [facility];  Service: General;;   ROBOTIC ASSISTED SALPINGO OOPHERECTOMY Left 11/16/2013   Robotic en bloc w LAR resection   TUBAL LIGATION     UPPER GASTROINTESTINAL ENDOSCOPY      Social History   Socioeconomic History   Marital status: Married    Spouse name: Not on file   Number of children: 1   Years of education: Not on file   Highest education level: Not on file  Occupational History   Occupation: REGISTRATION   Occupation: retired  Tobacco Use   Smoking status: Never   Smokeless tobacco: Never  Vaping Use   Vaping Use: Never used  Substance and Sexual Activity   Alcohol use: No   Drug use: No   Sexual activity: Not on file  Other Topics Concern   Not on file  Social History Narrative   Lives with spouse in [place] Determinants of Health   Financial Resource Strain: Not on file  Food Insecurity: Not on file  Transportation Needs: Not on file  Physical Activity: Not on file  Stress: Not on file  Social Connections: Not on file  Intimate Partner Violence: Not on file    Outpatient Encounter Medications as of 11/17/2021  Medication Sig   Ascorbic Acid (VITAMIN C) 1000 MG tablet Take 1,000 mg by mouth daily.   buPROPion (WELLBUTRIN XL) 300 MG 24 hr tablet Take 1 tablet (300 mg total) by mouth daily.   busPIRone (BUSPAR) 15 MG tablet TAKE  (1)  TABLET  THREE TIMES DAILY.   cholecalciferol (VITAMIN D) 25 MCG (1000 UNIT) tablet Take 1,000 Units by mouth daily.    D-MANNOSE PO Take 1 tablet by mouth daily.   esomeprazole (NEXIUM) 40 MG capsule TAKE 1 CAPSULE ONCE DAILY AT NOON   estradiol (ESTRACE) 0.1 MG/GM vaginal cream INSERT 1 GRAM VAGINALLY AT BEDTIME FOR 2 WEEKS, THEN  TWICE A WEEK THEREAFTER   fluticasone (FLONASE) 50 MCG/ACT nasal spray 1 SPRAY IN EACH NOSTRIL DAILY AS NEEDED FOR ALLERGIES   guaiFENesin (MUCINEX) 600 MG 12 hr tablet Take 600 mg by mouth 2 (two) times daily as needed for cough.   levocetirizine (XYZAL) 5 MG tablet Take 1 tablet (5 mg total) by mouth every evening.   lisinopril-hydrochlorothiazide (ZESTORETIC) 20-12.5 MG tablet TAKE (1) TABLET DAILY IN THE MORNING.   LORazepam (ATIVAN) 1 MG tablet Take 1 tablet every night at bedtime.  May take 1/2 tablet during daytime if needed.   meclizine (ANTIVERT) 25 MG tablet Take 1 tablet (25 mg total) by mouth 3 (three) times daily as needed for dizziness.   medroxyPROGESTERone (PROVERA) 2.5 MG tablet TAKE ONE TABLET ONCE DAILY   Multiple Vitamin (MULTIVITAMIN) tablet Take  1 tablet by mouth daily.     naproxen (NAPROSYN) 375 MG tablet Take 1 tablet (375 mg total) by mouth 2 (two) times daily as needed for moderate pain (take with food and full glass of water).   nitrofurantoin (MACRODANTIN) 50 MG capsule Take 50 mg by mouth 4 (four) times daily.   polyethylene glycol powder (GLYCOLAX/MIRALAX) powder Take 1 Container by mouth daily as needed for mild constipation.    rosuvastatin (CRESTOR) 10 MG tablet Take 1 tablet (10 mg total) by mouth daily.   trolamine salicylate (ASPERCREME) 10 % cream Apply 1 application topically as needed for muscle pain.   Zinc Sulfate (ZINC 15 PO) Take by mouth.   No facility-administered encounter medications on file as of 11/17/2021.    Allergies  Allergen Reactions   Zoloft [Sertraline Hcl] Nausea Only        Amoxapine And Related Rash   Cephalexin Rash   Oxycodone     "Loopy"   Penicillins Rash    Review of Systems  Constitutional:  Negative for activity change, appetite change, chills, diaphoresis, fatigue, fever and unexpected weight change.  Respiratory:  Negative for cough and shortness of breath.   Cardiovascular:  Negative for chest pain, palpitations  and leg swelling.  Gastrointestinal:  Positive for abdominal pain (lower abdominal pressure with voiding). Negative for abdominal distention, anal bleeding, blood in stool, constipation, diarrhea, nausea, rectal pain and vomiting.  Genitourinary:  Positive for decreased urine volume, dysuria, frequency, hesitancy and urgency. Negative for difficulty urinating, dyspareunia, enuresis, flank pain, genital sores, hematuria, pelvic pain, vaginal bleeding, vaginal discharge and vaginal pain.  Musculoskeletal:  Negative for back pain.  Neurological:  Negative for dizziness, weakness and headaches.  Psychiatric/Behavioral:  Negative for confusion.   All other systems reviewed and are negative.       Observations/Objective: No vital signs or physical exam, this was a virtual health encounter.  Pt alert and oriented, answers all questions appropriately, and able to speak in full sentences.    Assessment and Plan: Lauramae was seen today for dysuria.  Diagnoses and all orders for this visit:  UTI symptoms Pt to come by office and provide urine sample. Further treatment pending results. Pt aware to increase water intake and avoid bladder irritants such as caffeine. No red flags concerning for acute pyelonephritis. Report any new, worsening, or persistent symptoms.  -     Urine Culture -     Urinalysis, Routine w reflex microscopic     Follow Up Instructions: Return if symptoms worsen or fail to improve.    I discussed the assessment and treatment plan with the patient. The patient was provided an opportunity to ask questions and all were answered. The patient agreed with the plan and demonstrated an understanding of the instructions.   The patient was advised to call back or seek an in-person evaluation if the symptoms worsen or if the condition fails to improve as anticipated.  The above assessment and management plan was discussed with the patient. The patient verbalized understanding of and  has agreed to the management plan. Patient is aware to call the clinic if they develop any new symptoms or if symptoms persist or worsen. Patient is aware when to return to the clinic for a follow-up visit. Patient educated on when it is appropriate to go to the emergency department.    I provided 15 minutes of time during this telephone encounter.   Monia Pouch, FNP-C [facility] [address]  [place], [place] [address] [phone] 11/17/2021

## 2021-11-17 NOTE — Addendum Note (Signed)
Addended by: [name] on: 11/17/2021 10:57 AM   Modules accepted: Orders

## 2021-11-18 LAB — URINE CULTURE

## 2021-11-21 ENCOUNTER — Other Ambulatory Visit: Payer: Self-pay | Admitting: Family Medicine

## 2021-11-21 DIAGNOSIS — Z780 Asymptomatic menopausal state: Secondary | ICD-10-CM

## 2021-12-01 ENCOUNTER — Ambulatory Visit
Admission: RE | Admit: 2021-12-01 | Discharge: 2021-12-01 | Disposition: A | Discharge status: Home / Self Care | Payer: Medicare Other | Source: Ambulatory Visit | Attending: Family Medicine | Admitting: Family Medicine

## 2021-12-01 DIAGNOSIS — Z1231 Encounter for screening mammogram for malignant neoplasm of breast: Secondary | ICD-10-CM | POA: Diagnosis not present

## 2021-12-02 ENCOUNTER — Other Ambulatory Visit: Payer: Self-pay | Admitting: Gastroenterology

## 2021-12-07 ENCOUNTER — Ambulatory Visit: Payer: Medicare Other | Admitting: Orthopedic Surgery

## 2021-12-07 ENCOUNTER — Encounter: Payer: Self-pay | Admitting: Orthopedic Surgery

## 2021-12-07 ENCOUNTER — Ambulatory Visit (INDEPENDENT_AMBULATORY_CARE_PROVIDER_SITE_OTHER): Payer: Medicare Other

## 2021-12-07 VITALS — BP 177/115 | HR 116 | Ht 61.5 in | Wt 140.0 lb

## 2021-12-07 DIAGNOSIS — M25512 Pain in left shoulder: Secondary | ICD-10-CM

## 2021-12-07 DIAGNOSIS — G8929 Other chronic pain: Secondary | ICD-10-CM

## 2021-12-07 NOTE — Progress Notes (Signed)
Patient ID: Carmen Green, female   DOB: August 03, 1950, 71 y.o.   MRN: [id number]  ASSESSMENT AND PLAN:  Carmen Green does not seem to have a rotator cuff tear despite the small amount of proximal migration of the humeral head which suggest there is some chronic rotator cuff disease  Recommend subacromial injection, combination of stretching strengthening exercises  Ibuprofen  Doubt any surgical intervention will be needed at this time   Chief Complaint  Patient presents with   Shoulder Pain    Left for a couple months       HPI Carmen Green is a 71 y.o. female.  71 year old female no specific trauma.  She has been doing a lot of yard work complains of left shoulder pain some decreased range of motion no real weakness.  No fall no injury  Treatment:  Nsaids yes initially naproxen which did not help ibuprofen seems to help PT no Injection no   Review of Systems ROS Pertinent negatives no dizziness no tremor  Positive for heartburn urgency easy bruising bleeding pollen allergy some tingling left hand  Past Medical History:  Diagnosis Date   Allergy    Anemia    Anxiety    Arthritis    Cataract    bil cataracts removed   Colon polyp    Complete heart block (HCC)    Contusion of chest wall 10/10/2007   Centricity Description: CHEST WALL CONTUSION Qualifier: Diagnosis of  By: Aline Brochure MD, Dorothyann Peng   Centricity Description: CONTUSION, RIGHT RIB Qualifier: Diagnosis of  By: Aline Brochure MD, Amylee Lodato     Depression    Diverticulitis    Esophagitis    ESOPHAGITIS 06/25/2008   Qualifier: History of  By: Harlon Ditty CMA (AAMA), Dottie     GERD (gastroesophageal reflux disease)    H/O seasonal allergies    Heart murmur    Hiatal hernia    Hyperlipidemia    s/p PPM implant by Dr Olevia Perches 1992 with generator change 2004 ([facility]), she is device dependant   Hypertension    Mitral valve prolapse    Pacemaker     Past Surgical History:  Procedure Laterality Date   APPENDECTOMY      BREAST EXCISIONAL BIOPSY Right 07/08/1998   BREAST SURGERY Right    biopsy   COLONOSCOPY  10/19/2004   [place]   EYE SURGERY Left    cataract extraction with IOL   LAPAROSCOPIC LOW ANTERIOR RESECTION  11/16/2013   Robotic   LAPAROSCOPIC LYSIS OF ADHESIONS N/A 11/15/2013   Procedure: LAPAROSCOPIC LYSIS OF ADHESIONS;  Surgeon: Adin Hector, MD;  Location: [facility];  Service: General;  Laterality: N/A;   left ovary cyst removal  [place] N/A 01/22/2020   Procedure: PACEMAKER IMPLANT;  Surgeon: Thompson Grayer, MD;  Location: [facility];  Service: Cardiovascular;  Laterality: N/A;   PACEMAKER INSERTION  1992, 2004, 2012   device dependant   POLYPECTOMY     PROCTOSCOPY  11/15/2013   Procedure: RIGID PROCTOSCOPY;  Surgeon: Adin Hector, MD;  Location: [facility];  Service: General;;   ROBOTIC ASSISTED SALPINGO OOPHERECTOMY Left 11/16/2013   Robotic en bloc w LAR resection   TUBAL LIGATION     UPPER GASTROINTESTINAL ENDOSCOPY      Family History  Problem Relation Age of Onset   Diabetes Mother    Stroke Mother    Diabetes Father    Lung cancer Father    Heart disease Maternal Aunt    Breast  cancer Maternal Aunt    Heart disease Maternal Grandmother    Lung cancer Maternal Grandfather    Colon cancer Neg Hx    Esophageal cancer Neg Hx    Pancreatic cancer Neg Hx    Rectal cancer Neg Hx    Stomach cancer Neg Hx      Social History   Tobacco Use   Smoking status: Never   Smokeless tobacco: Never  Vaping Use   Vaping Use: Never used  Substance Use Topics   Alcohol use: No   Drug use: No    Allergies  Allergen Reactions   Zoloft [Sertraline Hcl] Nausea Only        Amoxapine And Related Rash   Cephalexin Rash   Oxycodone     "Loopy"   Penicillins Rash    Allergies  Allergen Reactions   Zoloft [Sertraline Hcl] Nausea Only        Amoxapine And Related Rash   Cephalexin Rash   Oxycodone     "Loopy"   Penicillins Rash     Current Meds   Medication Sig   Ascorbic Acid (VITAMIN C) 1000 MG tablet Take 1,000 mg by mouth daily.   buPROPion (WELLBUTRIN XL) 300 MG 24 hr tablet Take 1 tablet (300 mg total) by mouth daily.   busPIRone (BUSPAR) 15 MG tablet TAKE  (1)  TABLET  THREE TIMES DAILY.   cholecalciferol (VITAMIN D) 25 MCG (1000 UNIT) tablet Take 1,000 Units by mouth daily.    D-MANNOSE PO Take 1 tablet by mouth daily.   esomeprazole (NEXIUM) 40 MG capsule TAKE ONE CAPSULE DAILY AT NOON   estradiol (ESTRACE) 0.1 MG/GM vaginal cream INSERT 1 GRAM VAGINALLY AT BEDTIME FOR 2 WEEKS, THEN TWICE A WEEK THEREAFTER   fluticasone (FLONASE) 50 MCG/ACT nasal spray 1 SPRAY IN EACH NOSTRIL DAILY AS NEEDED FOR ALLERGIES   guaiFENesin (MUCINEX) 600 MG 12 hr tablet Take 600 mg by mouth 2 (two) times daily as needed for cough.   levocetirizine (XYZAL) 5 MG tablet Take 1 tablet (5 mg total) by mouth every evening.   lisinopril-hydrochlorothiazide (ZESTORETIC) 20-12.5 MG tablet TAKE (1) TABLET DAILY IN THE MORNING.   LORazepam (ATIVAN) 1 MG tablet Take 1 tablet every night at bedtime.  May take 1/2 tablet during daytime if needed.   meclizine (ANTIVERT) 25 MG tablet Take 1 tablet (25 mg total) by mouth 3 (three) times daily as needed for dizziness.   medroxyPROGESTERone (PROVERA) 2.5 MG tablet TAKE ONE TABLET ONCE DAILY   Multiple Vitamin (MULTIVITAMIN) tablet Take 1 tablet by mouth daily.     nitrofurantoin (MACRODANTIN) 50 MG capsule Take 50 mg by mouth 4 (four) times daily.   polyethylene glycol powder (GLYCOLAX/MIRALAX) powder Take 1 Container by mouth daily as needed for mild constipation.    rosuvastatin (CRESTOR) 10 MG tablet Take 1 tablet (10 mg total) by mouth daily.   trolamine salicylate (ASPERCREME) 10 % cream Apply 1 application topically as needed for muscle pain.   Zinc Sulfate (ZINC 15 PO) Take by mouth.   [DISCONTINUED] naproxen (NAPROSYN) 375 MG tablet Take 1 tablet (375 mg total) by mouth 2 (two) times daily as needed for  moderate pain (take with food and full glass of water).       Physical Exam BP (!) 177/115   Pulse (!) 116   Ht 5' 1.5" (1.562 m)   Wt 140 lb (63.5 kg)   BMI 26.02 kg/m   Ambulatory status normal with no assistive devices  GENERAL : APPEARANCE IS NORMAL GROOMING IS GOOD  NORMAL MOOD AND AFFECT  AWAKE ALERT AND ORIENTED X 3   Left SHOULDER  TENDERNESS rotator interval ROM flexion normal passive abnormal active only to 90 decreased internal rotation STABLE ANTERIORLY POSTERIORLY AND INFERIORLY SKIN CLEAN     PROVOCATIVE TESTS  DROP ARM TEST normal PAINFUL ARC 93 150 EMPTY CAN -JOBST TEST normal EXTERNAL ROTATION LAG TEST not tested LIFT OFF TEST not tested BELLYPRESS TEST normal   MEDICAL DECISION MAKING  A.  Encounter Diagnosis  Name Primary?   Chronic pain in left shoulder Yes    B. DATA ANALYSED:    IMAGING: Independent interpretation of images: Imaging no arthritis in the shoulder.  Medial Shenton's line is slightly broken with proximal migration of the humeral head decreased acromiohumeral distance  Orders: none  Outside records reviewed: no  C. MANAGEMENT as above  Procedure note the subacromial injection shoulder left   Verbal consent was obtained to inject the  Left   Shoulder  Timeout was completed to confirm the injection site is a subacromial space of the  left  shoulder  Medication used Depo-Medrol 40 mg and lidocaine 1% 3 cc  Anesthesia was provided by ethyl chloride  The injection was performed in the left  posterior subacromial space. After pinning the skin with alcohol and anesthetized the skin with ethyl chloride the subacromial space was injected using a 20-gauge needle. There were no complications  Sterile dressing was applied.          No orders of the defined types were placed in this encounter.

## 2021-12-07 NOTE — Patient Instructions (Signed)
You have received an injection of steroids into the joint. 15% of patients will have increased pain within the 24 hours postinjection.   This is transient and will go away.   We recommend that you use ice packs on the injection site for 20 minutes every 2 hours and extra strength Tylenol 2 tablets every 8 as needed until the pain resolves.  If you continue to have pain after taking the Tylenol and using the ice please call the office for further instructions.  Do the exercises   Continue the ibuprofen

## 2021-12-11 DIAGNOSIS — H43812 Vitreous degeneration, left eye: Secondary | ICD-10-CM | POA: Diagnosis not present

## 2021-12-11 DIAGNOSIS — H35373 Puckering of macula, bilateral: Secondary | ICD-10-CM | POA: Diagnosis not present

## 2021-12-15 ENCOUNTER — Ambulatory Visit (INDEPENDENT_AMBULATORY_CARE_PROVIDER_SITE_OTHER): Payer: Medicare Other | Admitting: Family Medicine

## 2021-12-15 VITALS — BP 122/74 | HR 87 | Temp 98.4°F | Ht 61.5 in | Wt 138.2 lb

## 2021-12-15 DIAGNOSIS — Z79899 Other long term (current) drug therapy: Secondary | ICD-10-CM | POA: Diagnosis not present

## 2021-12-15 DIAGNOSIS — F410 Panic disorder [episodic paroxysmal anxiety] without agoraphobia: Secondary | ICD-10-CM | POA: Diagnosis not present

## 2021-12-15 DIAGNOSIS — F411 Generalized anxiety disorder: Secondary | ICD-10-CM | POA: Diagnosis not present

## 2021-12-15 MED ORDER — LORAZEPAM 1 MG PO TABS: ORAL_TABLET | ORAL | 1 refills | Status: DC

## 2021-12-15 NOTE — Progress Notes (Signed)
Subjective: CC: Follow-up panic attacks PCP: Raliegh Ip, DO NWG:NFAOZH LAMARI PILKENTON is a 71 y.o. female presenting to clinic today for:  1.  Generalized anxiety or panic attacks Patient notes that she continues to have some slight increased anxiety, specifically surrounding the help of her spouse and daughter.  Her daughter had a recent episode where she essentially got dehydrated and had strokelike symptoms.  Luckily, stroke was ruled out but Mrs. Kanak still worries about her quite a bit.  Additionally, her husband has had some repeat scans done recently which showed clearing.  This has been good news but he will continue to follow-up with specialty and have repeat scans in the next several months.  She denies any excessive daytime sedation, falls, respiratory pression, visual auditory hallucinations or memory issues with the chronic benzodiazepine.   ROS: Per HPI  Allergies  Allergen Reactions   Zoloft [Sertraline Hcl] Nausea Only        Amoxapine And Related Rash   Cephalexin Rash   Oxycodone     "Loopy"   Penicillins Rash   Past Medical History:  Diagnosis Date   Allergy    Anemia    Anxiety    Arthritis    Cataract    bil cataracts removed   Colon polyp    Complete heart block (HCC)    Contusion of chest wall 10/10/2007   Centricity Description: CHEST WALL CONTUSION Qualifier: Diagnosis of  By: Romeo Apple MD, Duffy Rhody   Centricity Description: CONTUSION, RIGHT RIB Qualifier: Diagnosis of  By: Romeo Apple MD, Stanley     Depression    Diverticulitis    Esophagitis    ESOPHAGITIS 06/25/2008   Qualifier: History of  By: Candice Camp CMA (AAMA), Dottie     GERD (gastroesophageal reflux disease)    H/O seasonal allergies    Heart murmur    Hiatal hernia    Hyperlipidemia    s/p PPM implant by Dr Juanda Chance 1992 with generator change 2004 ([facility]), she is device dependant   Hypertension    Mitral valve prolapse    Pacemaker     Current Outpatient Medications:     Ascorbic Acid (VITAMIN C) 1000 MG tablet, Take 1,000 mg by mouth daily., Disp: , Rfl:    buPROPion (WELLBUTRIN XL) 300 MG 24 hr tablet, Take 1 tablet (300 mg total) by mouth daily., Disp: 90 tablet, Rfl: 3   busPIRone (BUSPAR) 15 MG tablet, TAKE  (1)  TABLET  THREE TIMES DAILY., Disp: 270 tablet, Rfl: 3   cholecalciferol (VITAMIN D) 25 MCG (1000 UNIT) tablet, Take 1,000 Units by mouth daily. , Disp: , Rfl:    D-MANNOSE PO, Take 1 tablet by mouth daily., Disp: , Rfl:    esomeprazole (NEXIUM) 40 MG capsule, TAKE ONE CAPSULE DAILY AT NOON, Disp: 90 capsule, Rfl: 3   estradiol (ESTRACE) 0.1 MG/GM vaginal cream, INSERT 1 GRAM VAGINALLY AT BEDTIME FOR 2 WEEKS, THEN TWICE A WEEK THEREAFTER, Disp: , Rfl:    fluticasone (FLONASE) 50 MCG/ACT nasal spray, 1 SPRAY IN EACH NOSTRIL DAILY AS NEEDED FOR ALLERGIES, Disp: 16 g, Rfl: 3   guaiFENesin (MUCINEX) 600 MG 12 hr tablet, Take 600 mg by mouth 2 (two) times daily as needed for cough., Disp: , Rfl:    levocetirizine (XYZAL) 5 MG tablet, Take 1 tablet (5 mg total) by mouth every evening., Disp: 90 tablet, Rfl: 3   lisinopril-hydrochlorothiazide (ZESTORETIC) 20-12.5 MG tablet, TAKE (1) TABLET DAILY IN THE MORNING., Disp: 90 tablet, Rfl: 3  meclizine (ANTIVERT) 25 MG tablet, Take 1 tablet (25 mg total) by mouth 3 (three) times daily as needed for dizziness., Disp: 30 tablet, Rfl: 0   medroxyPROGESTERone (PROVERA) 2.5 MG tablet, TAKE ONE TABLET ONCE DAILY, Disp: 90 tablet, Rfl: 0   Multiple Vitamin (MULTIVITAMIN) tablet, Take 1 tablet by mouth daily.  , Disp: , Rfl:    nitrofurantoin (MACRODANTIN) 50 MG capsule, Take 50 mg by mouth 4 (four) times daily., Disp: , Rfl:    polyethylene glycol powder (GLYCOLAX/MIRALAX) powder, Take 1 Container by mouth daily as needed for mild constipation. , Disp: , Rfl:    rosuvastatin (CRESTOR) 10 MG tablet, Take 1 tablet (10 mg total) by mouth daily., Disp: 90 tablet, Rfl: 1   trolamine salicylate (ASPERCREME) 10 % cream, Apply 1  application topically as needed for muscle pain., Disp: , Rfl:    Zinc Sulfate (ZINC 15 PO), Take by mouth., Disp: , Rfl:    LORazepam (ATIVAN) 1 MG tablet, Take 1 tablet every night at bedtime.  May take 1/2 tablet during daytime if needed., Disp: 135 tablet, Rfl: 1 Social History   Socioeconomic History   Marital status: Married    Spouse name: Not on file   Number of children: 1   Years of education: Not on file   Highest education level: Not on file  Occupational History   Occupation: REGISTRATION   Occupation: retired  Tobacco Use   Smoking status: Never   Smokeless tobacco: Never  Vaping Use   Vaping Use: Never used  Substance and Sexual Activity   Alcohol use: No   Drug use: No   Sexual activity: Not on file  Other Topics Concern   Not on file  Social History Narrative   Lives with spouse in [place]   Social Determinants of Health   Financial Resource Strain: Not on file  Food Insecurity: Not on file  Transportation Needs: Not on file  Physical Activity: Not on file  Stress: Not on file  Social Connections: Not on file  Intimate Partner Violence: Not on file   Family History  Problem Relation Age of Onset   Diabetes Mother    Stroke Mother    Diabetes Father    Lung cancer Father    Heart disease Maternal Aunt    Breast cancer Maternal Aunt    Heart disease Maternal Grandmother    Lung cancer Maternal Grandfather    Colon cancer Neg Hx    Esophageal cancer Neg Hx    Pancreatic cancer Neg Hx    Rectal cancer Neg Hx    Stomach cancer Neg Hx     Objective: Office vital signs reviewed. BP 122/74   Pulse 87   Temp 98.4 F (36.9 C) (Temporal)   Ht 5' 1.5" (1.562 m)   Wt 138 lb 4 oz (62.7 kg)   SpO2 99%   BMI 25.70 kg/m   Physical Examination:  General: Awake, alert, well nourished, No acute distress Psych: Mood stable, speech normal, affect appropriate.  Very pleasant, interactive.  Thought process is linear.  Good eye contact     12/15/2021     3:55 PM 09/30/2021    8:35 AM 07/06/2021    8:53 AM  Depression screen PHQ 2/9  Decreased Interest 0 0 1  Down, Depressed, Hopeless 0 0 0  PHQ - 2 Score 0 0 1  Altered sleeping 0  0  Tired, decreased energy 0  0  Change in appetite 0  0  Feeling  bad or failure about yourself  0  0  Trouble concentrating 0  1  Moving slowly or fidgety/restless 0  0  Suicidal thoughts 0  0  PHQ-9 Score 0  2  Difficult doing work/chores Not difficult at all        12/15/2021    3:56 PM 09/30/2021    8:35 AM 07/06/2021    8:53 AM 01/05/2021   10:32 AM  GAD 7 : Generalized Anxiety Score  Nervous, Anxious, on Edge 0 0 1 0  Control/stop worrying 0 0 1 0  Worry too much - different things 0 0 0 0  Trouble relaxing 0 0 1 0  Restless 0 0 0 0  Easily annoyed or irritable 0 0 0 0  Afraid - awful might happen 0 0 0 0  Total GAD 7 Score 0 0 3 0  Anxiety Difficulty Not difficult at all Not difficult at all Not difficult at all Not difficult at all     Assessment/ Plan: 71 y.o. female   Generalized anxiety disorder with panic attacks - Plan: LORazepam (ATIVAN) 1 MG tablet, ToxASSURE Select 13 (MW), Urine  Controlled substance agreement signed - Plan: ToxASSURE Select 13 (MW), Urine  Anxiety and panic are stable with chronic benzodiazepine use.  While technically high risk in this greater than 70 year old patient, she demonstrates no red flag signs or symptoms.  She was updated on UDS and CSC today.  National narcotic database reviewed and there were no red flags.  She will follow-up in 6 months, sooner if concerns arise  Orders Placed This Encounter  Procedures   ToxASSURE Select 13 (MW), Urine    Current Outpatient Medications:    Ascorbic Acid (VITAMIN C) 1000 MG tablet, Take 1,000 mg by mouth daily., Disp: , Rfl:    buPROPion (WELLBUTRIN XL) 300 MG 24 hr tablet, Take 1 tablet (300 mg total) by mouth daily., Disp: 90 tablet, Rfl: 3   busPIRone (BUSPAR) 15 MG tablet, TAKE  (1)  TABLET  THREE TIMES  DAILY., Disp: 270 tablet, Rfl: 3   cholecalciferol (VITAMIN D) 25 MCG (1000 UNIT) tablet, Take 1,000 Units by mouth daily. , Disp: , Rfl:    D-MANNOSE PO, Take 1 tablet by mouth daily., Disp: , Rfl:    esomeprazole (NEXIUM) 40 MG capsule, TAKE ONE CAPSULE DAILY AT NOON, Disp: 90 capsule, Rfl: 3   estradiol (ESTRACE) 0.1 MG/GM vaginal cream, INSERT 1 GRAM VAGINALLY AT BEDTIME FOR 2 WEEKS, THEN TWICE A WEEK THEREAFTER, Disp: , Rfl:    fluticasone (FLONASE) 50 MCG/ACT nasal spray, 1 SPRAY IN EACH NOSTRIL DAILY AS NEEDED FOR ALLERGIES, Disp: 16 g, Rfl: 3   guaiFENesin (MUCINEX) 600 MG 12 hr tablet, Take 600 mg by mouth 2 (two) times daily as needed for cough., Disp: , Rfl:    levocetirizine (XYZAL) 5 MG tablet, Take 1 tablet (5 mg total) by mouth every evening., Disp: 90 tablet, Rfl: 3   lisinopril-hydrochlorothiazide (ZESTORETIC) 20-12.5 MG tablet, TAKE (1) TABLET DAILY IN THE MORNING., Disp: 90 tablet, Rfl: 3   LORazepam (ATIVAN) 1 MG tablet, Take 1 tablet every night at bedtime.  May take 1/2 tablet during daytime if needed., Disp: 135 tablet, Rfl: 1   meclizine (ANTIVERT) 25 MG tablet, Take 1 tablet (25 mg total) by mouth 3 (three) times daily as needed for dizziness., Disp: 30 tablet, Rfl: 0   medroxyPROGESTERone (PROVERA) 2.5 MG tablet, TAKE ONE TABLET ONCE DAILY, Disp: 90 tablet, Rfl: 0   Multiple Vitamin (  MULTIVITAMIN) tablet, Take 1 tablet by mouth daily.  , Disp: , Rfl:    nitrofurantoin (MACRODANTIN) 50 MG capsule, Take 50 mg by mouth 4 (four) times daily., Disp: , Rfl:    polyethylene glycol powder (GLYCOLAX/MIRALAX) powder, Take 1 Container by mouth daily as needed for mild constipation. , Disp: , Rfl:    rosuvastatin (CRESTOR) 10 MG tablet, Take 1 tablet (10 mg total) by mouth daily., Disp: 90 tablet, Rfl: 1   trolamine salicylate (ASPERCREME) 10 % cream, Apply 1 application topically as needed for muscle pain., Disp: , Rfl:    Zinc Sulfate (ZINC 15 PO), Take by mouth.,  Disp: , Rfl:   Meds ordered this encounter  Medications   LORazepam (ATIVAN) 1 MG tablet    Sig: Take 1 tablet every night at bedtime.  May take 1/2 tablet during daytime if needed.    Dispense:  135 tablet    Refill:  1     Denessa Cavan Hulen Skains, DO [facility] [phone]

## 2021-12-18 LAB — TOXASSURE SELECT 13 (MW), URINE

## 2021-12-29 ENCOUNTER — Other Ambulatory Visit: Payer: Self-pay | Admitting: Family Medicine

## 2021-12-29 DIAGNOSIS — F410 Panic disorder [episodic paroxysmal anxiety] without agoraphobia: Secondary | ICD-10-CM

## 2021-12-30 ENCOUNTER — Ambulatory Visit: Payer: Medicare Other | Admitting: Family Medicine

## 2022-01-08 ENCOUNTER — Ambulatory Visit (INDEPENDENT_AMBULATORY_CARE_PROVIDER_SITE_OTHER): Payer: Medicare Other

## 2022-01-08 VITALS — Wt 138.0 lb

## 2022-01-08 DIAGNOSIS — Z0000 Encounter for general adult medical examination without abnormal findings: Secondary | ICD-10-CM | POA: Diagnosis not present

## 2022-01-08 NOTE — Progress Notes (Signed)
Subjective:   Carmen Green is a 71 y.o. female who presents for Medicare Annual (Subsequent) preventive examination.  Virtual Visit via Telephone Note  I connected with  [name] on 01/08/22 at 10:30 AM EDT by telephone and verified that I am speaking with the correct person using two identifiers.  Location: Patient: Home Provider: [facility] Persons participating in the virtual visit: patient/Nurse Health Advisor   I discussed the limitations, risks, security and privacy concerns of performing an evaluation and management service by telephone and the availability of in person appointments. The patient expressed understanding and agreed to proceed.  Interactive audio and video telecommunications were attempted between this nurse and patient, however failed, due to patient having technical difficulties OR patient did not have access to video capability.  We continued and completed visit with audio only.  Some vital signs may be absent or patient reported.   Lucious Zou E Destanie Tibbetts, LPN   Review of Systems     Cardiac Risk Factors include: advanced age (>66mn, >>49women)     Objective:    Today's Vitals   01/08/22 1015  Weight: 138 lb (62.6 kg)  PainSc: 5    Body mass index is 25.65 kg/m.     01/08/2022   10:21 AM 01/07/2021    9:38 AM 01/22/2020   11:54 AM 10/25/2017    9:10 AM 12/21/2016   12:00 PM 11/15/2013    4:00 PM 11/09/2013    1:17 PM  Advanced Directives  Does Patient Have a Medical Advance Directive? Yes Yes Yes No Yes Patient has advance directive, copy not in chart Patient has advance directive, copy not in chart  Type of Advance Directive [place] will [place]Living will [place]War[place] ParkLiving will  [place] CityLiving will HPrince George'sLiving will [place]Living will  Does patient want to make changes to medical advance directive?      No change  requested   Copy of HAccordin Chart? Yes - validated most recent copy scanned in chart (See row information) Yes - validated most recent copy scanned in chart (See row information)   Yes  Copy requested from family  Pre-existing out of facility DNR order (yellow form or pink MOST form)      No     Current Medications (verified) Outpatient Encounter Medications as of 01/08/2022  Medication Sig   Ascorbic Acid (VITAMIN C) 1000 MG tablet Take 1,000 mg by mouth daily.   buPROPion (WELLBUTRIN XL) 300 MG 24 hr tablet Take 1 tablet (300 mg total) by mouth daily.   busPIRone (BUSPAR) 15 MG tablet TAKE 1 TABLET 3 TIMES A DAY   cholecalciferol (VITAMIN D) 25 MCG (1000 UNIT) tablet Take 1,000 Units by mouth daily.    D-MANNOSE PO Take 1 tablet by mouth daily.   esomeprazole (NEXIUM) 40 MG capsule TAKE ONE CAPSULE DAILY AT NOON   estradiol (ESTRACE) 0.1 MG/GM vaginal cream INSERT 1 GRAM VAGINALLY AT BEDTIME FOR 2 WEEKS, THEN TWICE A WEEK THEREAFTER   fluticasone (FLONASE) 50 MCG/ACT nasal spray 1 SPRAY IN EACH NOSTRIL DAILY AS NEEDED FOR ALLERGIES   levocetirizine (XYZAL) 5 MG tablet Take 1 tablet (5 mg total) by mouth every evening.   lisinopril-hydrochlorothiazide (ZESTORETIC) 20-12.5 MG tablet TAKE (1) TABLET DAILY IN THE MORNING.   LORazepam (ATIVAN) 1 MG tablet Take 1 tablet every night at bedtime.  May take 1/2 tablet during daytime if needed.  medroxyPROGESTERone (PROVERA) 2.5 MG tablet TAKE ONE TABLET ONCE DAILY   Multiple Vitamin (MULTIVITAMIN) tablet Take 1 tablet by mouth daily.     nitrofurantoin (MACRODANTIN) 50 MG capsule Take 50 mg by mouth 4 (four) times daily.   rosuvastatin (CRESTOR) 10 MG tablet TAKE 1 TABLET DAILY   Zinc Sulfate (ZINC 15 PO) Take by mouth.   guaiFENesin (MUCINEX) 600 MG 12 hr tablet Take 600 mg by mouth 2 (two) times daily as needed for cough. (Patient not taking: Reported on 01/08/2022)   meclizine (ANTIVERT) 25 MG tablet Take 1 tablet (25 mg  total) by mouth 3 (three) times daily as needed for dizziness. (Patient not taking: Reported on 01/08/2022)   polyethylene glycol powder (GLYCOLAX/MIRALAX) powder Take 1 Container by mouth daily as needed for mild constipation.  (Patient not taking: Reported on 8/93/8101)   trolamine salicylate (ASPERCREME) 10 % cream Apply 1 application topically as needed for muscle pain. (Patient not taking: Reported on 01/08/2022)   No facility-administered encounter medications on file as of 01/08/2022.    Allergies (verified) Zoloft [sertraline hcl], Amoxapine and related, Cephalexin, Oxycodone, and Penicillins   History: Past Medical History:  Diagnosis Date   Allergy    Anemia    Anxiety    Arthritis    Cataract    bil cataracts removed   Colon polyp    Complete heart block ([place])    Contusion of chest wall 10/10/2007   Centricity Description: CHEST WALL CONTUSION Qualifier: Diagnosis of  By: Aline Brochure MD, Dorothyann Peng   Centricity Description: CONTUSION, RIGHT RIB Qualifier: Diagnosis of  By: Aline Brochure MD, Stanley     Depression    Diverticulitis    Esophagitis    ESOPHAGITIS 06/25/2008   Qualifier: History of  By: Harlon Ditty CMA (AAMA), Dottie     GERD (gastroesophageal reflux disease)    H/O seasonal allergies    Heart murmur    Hiatal hernia    Hyperlipidemia    s/p PPM implant by Dr Olevia Perches 1992 with generator change 2004 ([facility]), she is device dependant   Hypertension    Mitral valve prolapse    Pacemaker    Past Surgical History:  Procedure Laterality Date   APPENDECTOMY     BREAST EXCISIONAL BIOPSY Right 07/08/1998   BREAST SURGERY Right    biopsy   COLONOSCOPY  10/19/2004   [place]   EYE SURGERY Left    cataract extraction with IOL   LAPAROSCOPIC LOW ANTERIOR RESECTION  11/16/2013   Robotic   LAPAROSCOPIC LYSIS OF ADHESIONS N/A 11/15/2013   Procedure: LAPAROSCOPIC LYSIS OF ADHESIONS;  Surgeon: Adin Hector, MD;  Location: [facility];  Service: General;  Laterality: N/A;   left ovary  cyst removal  [place] 01/22/2020   Procedure: PACEMAKER IMPLANT;  Surgeon: Thompson Grayer, MD;  Location: [facility];  Service: Cardiovascular;  Laterality: N/A;   PACEMAKER INSERTION  1992, 2004, 2012   device dependant   POLYPECTOMY     PROCTOSCOPY  11/15/2013   Procedure: RIGID PROCTOSCOPY;  Surgeon: Adin Hector, MD;  Location: [facility];  Service: General;;   ROBOTIC ASSISTED SALPINGO OOPHERECTOMY Left 11/16/2013   Robotic en bloc w LAR resection   TUBAL LIGATION     UPPER GASTROINTESTINAL ENDOSCOPY     Family History  Problem Relation Age of Onset   Diabetes Mother    Stroke Mother    Diabetes Father    Lung cancer Father    Heart disease Maternal  Aunt    Breast cancer Maternal Aunt    Heart disease Maternal Grandmother    Lung cancer Maternal Grandfather    Colon cancer Neg Hx    Esophageal cancer Neg Hx    Pancreatic cancer Neg Hx    Rectal cancer Neg Hx    Stomach cancer Neg Hx    Social History   Socioeconomic History   Marital status: Married    Spouse name: Gwyndolyn Saxon   Number of children: 1   Years of education: Not on file   Highest education level: Not on file  Occupational History   Occupation: REGISTRATION   Occupation: retired  Tobacco Use   Smoking status: Never   Smokeless tobacco: Never  Vaping Use   Vaping Use: Never used  Substance and Sexual Activity   Alcohol use: No   Drug use: No   Sexual activity: Not on file  Other Topics Concern   Not on file  Social History Narrative   Lives with spouse in [place] Determinants of Health   Financial Resource Strain: [place]  (01/08/2022)   Overall Financial Resource Strain (CARDIA)    Difficulty of Paying Living Expenses: Not hard at all  Food Insecurity: No Food Insecurity (01/08/2022)   Hunger Vital Sign    Worried About Running Out of Food in the Last Year: Never true    [place] in the Last Year: Never true  Transportation Needs: No Transportation  Needs (01/08/2022)   PRAPARE - Hydrologist (Medical): No    Lack of Transportation (Non-Medical): No  Physical Activity: Insufficiently Active (01/08/2022)   Exercise Vital Sign    Days of Exercise per Week: 1 day    Minutes of Exercise per Session: 30 min  Stress: No Stress Concern Present (01/08/2022)   [place]    Feeling of Stress : Not at all  Social Connections: [place] (01/08/2022)   Social Connection and Isolation Panel [NHANES]    Frequency of Communication with Friends and Family: More than three times a week    Frequency of Social Gatherings with Friends and Family: More than three times a week    Attends Religious Services: More than 4 times per year    Active Member of [employer] or Organizations: Yes    Attends Music therapist: More than 4 times per year    Marital Status: Married    Tobacco Counseling Counseling given: Not Answered   Clinical Intake:  Pre-visit preparation completed: Yes  Pain : 0-10 Pain Score: 5  Pain Type: Chronic pain Pain Location: Shoulder Pain Orientation: Left Pain Descriptors / Indicators: Aching, Sore Pain Onset: More than a month ago Pain Frequency: Intermittent     BMI - recorded: 25.65 Nutritional Status: BMI 25 -29 Overweight Nutritional Risks: None Diabetes: No  How often do you need to have someone help you when you read instructions, pamphlets, or other written materials from your doctor or pharmacy?: 1 - Never  Diabetic? no  Interpreter Needed?: No  Information entered by :: Ronnica Dreese, LPN   Activities of Daily Living    01/08/2022   10:21 AM  In your present state of health, do you have any difficulty performing the following activities:  Hearing? 1  Comment has hearing aids  Vision? 0  Difficulty concentrating or making decisions? 0  Walking or climbing stairs? 0  Dressing or bathing? 0  Doing errands, shopping? 0  Preparing Food and eating ? N  Using the Toilet? N  In the past six months, have you accidently leaked urine? Y  Do you have problems with loss of bowel control? N  Managing your Medications? N  Managing your Finances? N  Housekeeping or managing your Housekeeping? N    Patient Care Team: [name], DO as PCP - General (Family Medicine) Thompson Grayer, MD as PCP - Electrophysiology (Cardiology) Mauri Pole, MD as Consulting Physician (Gastroenterology) Warden Fillers, MD as Consulting Physician (Ophthalmology) Carole Civil, MD as Consulting Physician (Orthopedic Surgery) Robley Fries, MD as Consulting Physician (Urology)  Indicate any recent Medical Services you may have received from other than [facility] providers in the past year (date may be approximate).     Assessment:   This is a routine wellness examination for Margel.  Hearing/Vision screen Hearing Screening - Comments:: Wears hearing aids at times, they are uncomfortable - from [facility] in [place] - Comments:: Wears rx glasses - up to date with routine eye exams with [facility]  Dietary issues and exercise activities discussed:     Goals Addressed             This Visit's Progress    AWV   On track    01/08/2022 AWV Goal: Exercise for General Health  Patient will verbalize understanding of the benefits of increased physical activity: Exercising regularly is important. It will improve your overall fitness, flexibility, and endurance. Regular exercise also will improve your overall health. It can help you control your weight, reduce stress, and improve your bone density. Over the next year, patient will increase physical activity as tolerated with a goal of at least 150 minutes of moderate physical activity per week.  You can tell that you are exercising at a moderate intensity if your heart starts beating faster and you start breathing  faster but can still hold a conversation. Moderate-intensity exercise ideas include: Walking 1 mile (1.6 km) in about 15 minutes Biking Hiking Golfing Dancing Water aerobics Patient will verbalize understanding of everyday activities that increase physical activity by providing examples like the following: Yard work, such as: Sales promotion account executive Gardening Washing windows or floors Patient will be able to explain general safety guidelines for exercising:  Before you start a new exercise program, talk with your health care provider. Do not exercise so much that you hurt yourself, feel dizzy, or get very short of breath. Wear comfortable clothes and wear shoes with good support. Drink plenty of water while you exercise to prevent dehydration or heat stroke. Work out until your breathing and your heartbeat get faster.        Depression Screen    01/08/2022   10:20 AM 12/15/2021    3:55 PM 09/30/2021    8:35 AM 07/06/2021    8:53 AM 06/01/2021    8:44 AM 01/07/2021    9:42 AM 01/05/2021   10:32 AM  PHQ 2/9 Scores  PHQ - 2 Score 0 0 0 1 0 0 0  PHQ- 9 Score 0 0  2       Fall Risk    01/08/2022   10:16 AM 12/15/2021    3:55 PM 09/30/2021    8:35 AM 07/06/2021    8:53 AM 01/07/2021    9:38 AM  Fall Risk   Falls in the past year? 0 0 0  0 0  Number falls in past yr: 0    0  Injury with Fall? 0    0  Risk for fall due to : No Fall Risks    No Fall Risks  Follow up Falls prevention discussed    Falls prevention discussed    FALL RISK PREVENTION PERTAINING TO THE HOME:  Any stairs in or around the home? Yes  If so, are there any without handrails? No  Home free of loose throw rugs in walkways, pet beds, electrical cords, etc? Yes  Adequate lighting in your home to reduce risk of falls? Yes   ASSISTIVE DEVICES UTILIZED TO PREVENT FALLS:  Life alert? No  Use of a cane, walker or w/c? No  Grab bars  in the bathroom? Yes  Shower chair or bench in shower? Yes  Elevated toilet seat or a handicapped toilet? No   TIMED UP AND GO:  Was the test performed? No . Telephonic visit  Cognitive Function:    12/21/2016    4:42 PM  MMSE - Mini Mental State Exam  Orientation to time 5  Orientation to Place 5  Registration 3  Attention/ Calculation 5  Recall 3  Language- name 2 objects 2  Language- repeat 1  Language- follow 3 step command 3  Language- read & follow direction 1  Write a sentence 1  Copy design 1  Total score 30        01/08/2022   10:22 AM 01/07/2021    9:42 AM  6CIT Screen  What Year? 0 points 0 points  What month? 0 points 0 points  What time? 0 points 0 points  Count back from 20 0 points 0 points  Months in reverse 0 points 0 points  Repeat phrase 0 points 0 points  Total Score 0 points 0 points    Immunizations Immunization History  Administered Date(s) Administered   Fluad Quad(high Dose 65+) 07/22/2020, 04/24/2021   Influenza, High Dose Seasonal PF 05/05/2016, 05/19/2017, 04/26/2018   Influenza,inj,Quad PF,6+ Mos 05/02/2019   Influenza-Unspecified 05/05/2016, 05/19/2017, 04/26/2018   Moderna Sars-Covid-2 Vaccination 08/20/2019, 09/17/2019, 07/15/2020   Pneumococcal Conjugate-13 01/03/2015   Pneumococcal Polysaccharide-23 12/21/2016   Tdap 02/17/2021   Zoster Recombinat (Shingrix) 12/04/2019    TDAP status: Up to date  Flu Vaccine status: Up to date  Pneumococcal vaccine status: Up to date  Covid-19 vaccine status: Completed vaccines  Qualifies for Shingles Vaccine? Yes   Zostavax completed Yes   Shingrix Completed?: No.    Education has been provided regarding the importance of this vaccine. Patient has been advised to call insurance company to determine out of pocket expense if they have not yet received this vaccine. Advised may also receive vaccine at local pharmacy or Health Dept. Verbalized acceptance and understanding.  Screening  Tests Health Maintenance  Topic Date Due   Zoster Vaccines- Shingrix (2 of 2) 01/29/2020   COVID-19 Vaccine (4 - Booster for Moderna series) 09/09/2020   Hepatitis C Screening  10/01/2022 (Originally 09/29/1968)   INFLUENZA VACCINE  01/19/2022   MAMMOGRAM  12/02/2023   TETANUS/TDAP  02/18/2031   COLONOSCOPY (Pts 45-54yr Insurance coverage will need to be confirmed)  02/28/2031   Pneumonia Vaccine 71 Years old  Completed   DEXA SCAN  Completed   HPV VACCINES  Aged Out    Health Maintenance  Health Maintenance Due  Topic Date Due   Zoster Vaccines- Shingrix (2 of 2) 01/29/2020   COVID-19 Vaccine (4 - Booster for Moderna series)  09/09/2020    Colorectal cancer screening: No longer required.   Mammogram status: Completed 12/01/2021. Repeat every year  Bone Density status: Completed 07/07/2021. Results reflect: Bone density results: OSTEOPOROSIS. Repeat every 2 years.  Lung Cancer Screening: (Low Dose CT Chest recommended if Age 66-80 years, 30 pack-year currently smoking OR have quit w/in 15years.) does not qualify.  Additional Screening:  Hepatitis C Screening: does qualify; DUE  Vision Screening: Recommended annual ophthalmology exams for early detection of glaucoma and other disorders of the eye. Is the patient up to date with their annual eye exam?  Yes  Who is the provider or what is the name of the office in which the patient attends annual eye exams? [facility] If pt is not established with a provider, would they like to be referred to a provider to establish care? No .   Dental Screening: Recommended annual dental exams for proper oral hygiene  Community Resource Referral / Chronic Care Management: CRR required this visit?  No   CCM required this visit?  No      Plan:     I have personally reviewed and noted the following in the patient's chart:   Medical and social history Use of alcohol, tobacco or illicit drugs  Current medications and supplements including  opioid prescriptions.  Functional ability and status Nutritional status Physical activity Advanced directives List of other physicians Hospitalizations, surgeries, and ER visits in previous 12 months Vitals Screenings to include cognitive, depression, and falls Referrals and appointments  In addition, I have reviewed and discussed with patient certain preventive protocols, quality metrics, and best practice recommendations. A written personalized care plan for preventive services as well as general preventive health recommendations were provided to patient.     Sandrea Hammond, LPN   [id number]   Nurse Notes: None

## 2022-01-08 NOTE — Patient Instructions (Signed)
Ms. Tregre , Thank you for taking time to come for your Medicare Wellness Visit. I appreciate your ongoing commitment to your health goals. Please review the following plan we discussed and let me know if I can assist you in the future.   Screening recommendations/referrals: Colonoscopy: Done 02/27/2021 - no repeat required Mammogram: Done 12/01/2021 - Repeat annually  Bone Density: Done 07/07/2021 - Repeat every 2 years Recommended yearly ophthalmology/optometry visit for glaucoma screening and checkup Recommended yearly dental visit for hygiene and checkup  Vaccinations: Influenza vaccine: Done 04/24/2021 - Repeat annually  Pneumococcal vaccine: Done 01/03/2015 & 12/21/2016 Tdap vaccine: Done 02/17/2021 - Repeat in 10 years  Shingles vaccine: Done 12/04/2019 - declines second dose   Covid-19: Done 08/20/2019, 09/17/2019, 07/15/2020  Advanced directives: in chart  Conditions/risks identified: Aim for 30 minutes of exercise or brisk walking, 6-8 glasses of water, and 5 servings of fruits and vegetables each day.   Next appointment: Follow up in one year for your annual wellness visit    Preventive Care 65 Years and Older, Female Preventive care refers to lifestyle choices and visits with your health care provider that can promote health and wellness. What does preventive care include? A yearly physical exam. This is also called an annual well check. Dental exams once or twice a year. Routine eye exams. Ask your health care provider how often you should have your eyes checked. Personal lifestyle choices, including: Daily care of your teeth and gums. Regular physical activity. Eating a healthy diet. Avoiding tobacco and drug use. Limiting alcohol use. Practicing safe sex. Taking low-dose aspirin every day. Taking vitamin and mineral supplements as recommended by your health care provider. What happens during an annual well check? The services and screenings done by your health care provider  during your annual well check will depend on your age, overall health, lifestyle risk factors, and family history of disease. Counseling  Your health care provider may ask you questions about your: Alcohol use. Tobacco use. Drug use. Emotional well-being. Home and relationship well-being. Sexual activity. Eating habits. History of falls. Memory and ability to understand (cognition). Work and work Statistician. Reproductive health. Screening  You may have the following tests or measurements: Height, weight, and BMI. Blood pressure. Lipid and cholesterol levels. These may be checked every 5 years, or more frequently if you are over 71 years old. Skin check. Lung cancer screening. You may have this screening every year starting at age 46 if you have a 30-pack-year history of smoking and currently smoke or have quit within the past 15 years. Fecal occult blood test (FOBT) of the stool. You may have this test every year starting at age 2. Flexible sigmoidoscopy or colonoscopy. You may have a sigmoidoscopy every 5 years or a colonoscopy every 10 years starting at age 75. Hepatitis C blood test. Hepatitis B blood test. Sexually transmitted disease (STD) testing. Diabetes screening. This is done by checking your blood sugar (glucose) after you have not eaten for a while (fasting). You may have this done every 1-3 years. Bone density scan. This is done to screen for osteoporosis. You may have this done starting at age 33. Mammogram. This may be done every 1-2 years. Talk to your health care provider about how often you should have regular mammograms. Talk with your health care provider about your test results, treatment options, and if necessary, the need for more tests. Vaccines  Your health care provider may recommend certain vaccines, such as: Influenza vaccine. This is recommended  every year. Tetanus, diphtheria, and acellular pertussis (Tdap, Td) vaccine. You may need a Td booster every  10 years. Zoster vaccine. You may need this after age 9. Pneumococcal 13-valent conjugate (PCV13) vaccine. One dose is recommended after age 22. Pneumococcal polysaccharide (PPSV23) vaccine. One dose is recommended after age 54. Talk to your health care provider about which screenings and vaccines you need and how often you need them. This information is not intended to replace advice given to you by your health care provider. Make sure you discuss any questions you have with your health care provider. Document Released: 07/04/2015 Document Revised: 02/25/2016 Document Reviewed: 04/08/2015 Elsevier Interactive Patient Education  2017 [place] Prevention in the Home Falls can cause injuries. They can happen to people of all ages. There are many things you can do to make your home safe and to help prevent falls. What can I do on the outside of my home? Regularly fix the edges of walkways and driveways and fix any cracks. Remove anything that might make you trip as you walk through a door, such as a raised step or threshold. Trim any bushes or trees on the path to your home. Use bright outdoor lighting. Clear any walking paths of anything that might make someone trip, such as rocks or tools. Regularly check to see if handrails are loose or broken. Make sure that both sides of any steps have handrails. Any raised decks and porches should have guardrails on the edges. Have any leaves, snow, or ice cleared regularly. Use sand or salt on walking paths during winter. Clean up any spills in your garage right away. This includes oil or grease spills. What can I do in the bathroom? Use night lights. Install grab bars by the toilet and in the tub and shower. Do not use towel bars as grab bars. Use non-skid mats or decals in the tub or shower. If you need to sit down in the shower, use a plastic, non-slip stool. Keep the floor dry. Clean up any water that spills on the floor as soon as it  happens. Remove soap buildup in the tub or shower regularly. Attach bath mats securely with double-sided non-slip rug tape. Do not have throw rugs and other things on the floor that can make you trip. What can I do in the bedroom? Use night lights. Make sure that you have a light by your bed that is easy to reach. Do not use any sheets or blankets that are too big for your bed. They should not hang down onto the floor. Have a firm chair that has side arms. You can use this for support while you get dressed. Do not have throw rugs and other things on the floor that can make you trip. What can I do in the kitchen? Clean up any spills right away. Avoid walking on wet floors. Keep items that you use a lot in easy-to-reach places. If you need to reach something above you, use a strong step stool that has a grab bar. Keep electrical cords out of the way. Do not use floor polish or wax that makes floors slippery. If you must use wax, use non-skid floor wax. Do not have throw rugs and other things on the floor that can make you trip. What can I do with my stairs? Do not leave any items on the stairs. Make sure that there are handrails on both sides of the stairs and use them. Fix handrails that are broken  or loose. Make sure that handrails are as long as the stairways. Check any carpeting to make sure that it is firmly attached to the stairs. Fix any carpet that is loose or worn. Avoid having throw rugs at the top or bottom of the stairs. If you do have throw rugs, attach them to the floor with carpet tape. Make sure that you have a light switch at the top of the stairs and the bottom of the stairs. If you do not have them, ask someone to add them for you. What else can I do to help prevent falls? Wear shoes that: Do not have high heels. Have rubber bottoms. Are comfortable and fit you well. Are closed at the toe. Do not wear sandals. If you use a stepladder: Make sure that it is fully opened.  Do not climb a closed stepladder. Make sure that both sides of the stepladder are locked into place. Ask someone to hold it for you, if possible. Clearly mark and make sure that you can see: Any grab bars or handrails. First and last steps. Where the edge of each step is. Use tools that help you move around (mobility aids) if they are needed. These include: Canes. Walkers. Scooters. Crutches. Turn on the lights when you go into a dark area. Replace any light bulbs as soon as they burn out. Set up your furniture so you have a clear path. Avoid moving your furniture around. If any of your floors are uneven, fix them. If there are any pets around you, be aware of where they are. Review your medicines with your doctor. Some medicines can make you feel dizzy. This can increase your chance of falling. Ask your doctor what other things that you can do to help prevent falls. This information is not intended to replace advice given to you by your health care provider. Make sure you discuss any questions you have with your health care provider. Document Released: 04/03/2009 Document Revised: 11/13/2015 Document Reviewed: 07/12/2014 Elsevier Interactive Patient Education  2017 [employer].

## 2022-01-15 DIAGNOSIS — N3020 Other chronic cystitis without hematuria: Secondary | ICD-10-CM | POA: Diagnosis not present

## 2022-01-15 DIAGNOSIS — N952 Postmenopausal atrophic vaginitis: Secondary | ICD-10-CM | POA: Diagnosis not present

## 2022-01-18 ENCOUNTER — Other Ambulatory Visit: Payer: Self-pay | Admitting: Family Medicine

## 2022-01-18 DIAGNOSIS — F410 Panic disorder [episodic paroxysmal anxiety] without agoraphobia: Secondary | ICD-10-CM

## 2022-01-18 DIAGNOSIS — I10 Essential (primary) hypertension: Secondary | ICD-10-CM

## 2022-01-20 ENCOUNTER — Ambulatory Visit (INDEPENDENT_AMBULATORY_CARE_PROVIDER_SITE_OTHER): Payer: Medicare Other

## 2022-01-20 DIAGNOSIS — I442 Atrioventricular block, complete: Secondary | ICD-10-CM | POA: Diagnosis not present

## 2022-01-20 LAB — CUP PACEART REMOTE DEVICE CHECK
Battery Remaining Longevity: 103 mo
Battery Remaining Percentage: 83 %
Battery Voltage: 3.01 V
Brady Statistic AP VP Percent: 7.3 %
Brady Statistic AP VS Percent: 1 %
Brady Statistic AS VP Percent: 89 %
Brady Statistic AS VS Percent: 1.6 %
Brady Statistic RA Percent Paced: 5.7 %
Brady Statistic RV Percent Paced: 97 %
Date Time Interrogation Session: 20230802035936
Implantable Lead Implant Date: 20210803
Implantable Lead Implant Date: 20210803
Implantable Lead Location: 753859
Implantable Lead Location: 753860
Implantable Pulse Generator Implant Date: 20210803
Lead Channel Impedance Value: 400 Ohm
Lead Channel Impedance Value: 460 Ohm
Lead Channel Pacing Threshold Amplitude: 0.75 V
Lead Channel Pacing Threshold Amplitude: 0.875 V
Lead Channel Pacing Threshold Pulse Width: 0.4 ms
Lead Channel Pacing Threshold Pulse Width: 0.5 ms
Lead Channel Sensing Intrinsic Amplitude: 2.3 mV
Lead Channel Sensing Intrinsic Amplitude: 5 mV
Lead Channel Setting Pacing Amplitude: 1.125
Lead Channel Setting Pacing Amplitude: 2 V
Lead Channel Setting Pacing Pulse Width: 0.5 ms
Lead Channel Setting Sensing Sensitivity: 4 mV
Pulse Gen Model: 2272
Pulse Gen Serial Number: 3852139

## 2022-02-12 ENCOUNTER — Ambulatory Visit (INDEPENDENT_AMBULATORY_CARE_PROVIDER_SITE_OTHER): Payer: Medicare Other | Admitting: Family Medicine

## 2022-02-12 ENCOUNTER — Encounter: Payer: Self-pay | Admitting: Family Medicine

## 2022-02-12 VITALS — BP 131/73 | HR 98 | Temp 97.9°F | Ht 61.5 in | Wt 138.4 lb

## 2022-02-12 DIAGNOSIS — R300 Dysuria: Secondary | ICD-10-CM

## 2022-02-12 DIAGNOSIS — N3001 Acute cystitis with hematuria: Secondary | ICD-10-CM | POA: Diagnosis not present

## 2022-02-12 LAB — URINALYSIS, ROUTINE W REFLEX MICROSCOPIC
Bilirubin, UA: NEGATIVE
Nitrite, UA: POSITIVE — AB
Specific Gravity, UA: 1.01 (ref 1.005–1.030)
Urobilinogen, Ur: 1 mg/dL (ref 0.2–1.0)
pH, UA: 6 (ref 5.0–7.5)

## 2022-02-12 LAB — MICROSCOPIC EXAMINATION
Renal Epithel, UA: NONE SEEN /hpf
WBC, UA: >30 /hpf — AB (ref 0–5)

## 2022-02-12 MED ORDER — SULFAMETHOXAZOLE-TRIMETHOPRIM 800-160 MG PO TABS: 1.0000 | ORAL_TABLET | Freq: Two times a day (BID) | ORAL | 0 refills | Status: DC

## 2022-02-12 NOTE — Progress Notes (Signed)
Remote pacemaker transmission.   

## 2022-02-12 NOTE — Progress Notes (Signed)
Assessment & Plan:  1. Acute cystitis with hematuria Education provided on UTIs. Encouraged adequate hydration.  - Urine Culture - sulfamethoxazole-trimethoprim (BACTRIM DS) 800-160 MG tablet; Take 1 tablet by mouth 2 (two) times daily for 7 days.  Dispense: 14 tablet; Refill: 0  2. Dysuria - Urinalysis, Routine w reflex microscopic - Urine dipstick shows positive for RBC's, positive for glucose, positive for nitrates, positive for leukocytes, and positive for ketones.  Micro exam: >30 WBC's per HPF, 0-2 RBC's per HPF, and few bacteria.   Follow up plan: Return if symptoms worsen or fail to improve.  Hendricks Limes, MSN, APRN, FNP-C [facility]  Subjective:   Patient ID: Carmen Green, female    DOB: 06-10-1951, 71 y.o.   MRN: [id number]  HPI: Carmen Green is a 71 y.o. female presenting on 02/12/2022 for vaginal pressure (X 1 day- has been taking AZO) and Urinary Frequency (X 1 day)  Patient complains of frequency and suprapubic pressure. She has had symptoms for 1 day. Patient also complains of fever of 99.6 last night. Patient does have a history of recurrent UTI.  Patient does not have a history of pyelonephritis. Patient is followed by urology.  She was on preventive antibiotic therapy x6 months previously; her urologist then discontinued x3 months and plans to follow-up in October to see how she did without the antibiotic daily.  This is her first UTI since she went off the antibiotic.   ROS: Negative unless specifically indicated above in HPI.   Relevant past medical history reviewed and updated as indicated.   Allergies and medications reviewed and updated.   Current Outpatient Medications:    Ascorbic Acid (VITAMIN C) 1000 MG tablet, Take 1,000 mg by mouth daily., Disp: , Rfl:    buPROPion (WELLBUTRIN XL) 300 MG 24 hr tablet, TAKE 1 TABLET DAILY, Disp: 90 tablet, Rfl: 1   busPIRone (BUSPAR) 15 MG tablet, TAKE 1 TABLET 3 TIMES A DAY, Disp: 270  tablet, Rfl: 1   cholecalciferol (VITAMIN D) 25 MCG (1000 UNIT) tablet, Take 1,000 Units by mouth daily. , Disp: , Rfl:    D-MANNOSE PO, Take 1 tablet by mouth daily., Disp: , Rfl:    esomeprazole (NEXIUM) 40 MG capsule, TAKE ONE CAPSULE DAILY AT NOON, Disp: 90 capsule, Rfl: 3   estradiol (ESTRACE) 0.1 MG/GM vaginal cream, INSERT 1 GRAM VAGINALLY AT BEDTIME FOR 2 WEEKS, THEN TWICE A WEEK THEREAFTER, Disp: , Rfl:    fluticasone (FLONASE) 50 MCG/ACT nasal spray, 1 SPRAY IN EACH NOSTRIL DAILY AS NEEDED FOR ALLERGIES, Disp: 16 g, Rfl: 3   guaiFENesin (MUCINEX) 600 MG 12 hr tablet, Take 600 mg by mouth 2 (two) times daily as needed for cough., Disp: , Rfl:    levocetirizine (XYZAL) 5 MG tablet, Take 1 tablet (5 mg total) by mouth every evening., Disp: 90 tablet, Rfl: 3   lisinopril-hydrochlorothiazide (ZESTORETIC) 20-12.5 MG tablet, TAKE 1 TABLET EVERY MORNING, Disp: 90 tablet, Rfl: 1   LORazepam (ATIVAN) 1 MG tablet, Take 1 tablet every night at bedtime.  May take 1/2 tablet during daytime if needed., Disp: 135 tablet, Rfl: 1   meclizine (ANTIVERT) 25 MG tablet, Take 1 tablet (25 mg total) by mouth 3 (three) times daily as needed for dizziness., Disp: 30 tablet, Rfl: 0   medroxyPROGESTERone (PROVERA) 2.5 MG tablet, TAKE ONE TABLET ONCE DAILY, Disp: 90 tablet, Rfl: 0   Multiple Vitamin (MULTIVITAMIN) tablet, Take 1 tablet by mouth daily.  , Disp: ,  Rfl:    polyethylene glycol powder (GLYCOLAX/MIRALAX) powder, Take 1 Container by mouth daily as needed for mild constipation., Disp: , Rfl:    rosuvastatin (CRESTOR) 10 MG tablet, TAKE 1 TABLET DAILY, Disp: 90 tablet, Rfl: 1   trolamine salicylate (ASPERCREME) 10 % cream, Apply 1 application  topically as needed for muscle pain., Disp: , Rfl:    Zinc Sulfate (ZINC 15 PO), Take by mouth., Disp: , Rfl:    nitrofurantoin (MACRODANTIN) 50 MG capsule, Take 50 mg by mouth 4 (four) times daily. (Patient not taking: Reported on 02/12/2022), Disp: , Rfl:   Allergies   Allergen Reactions   Zoloft [Sertraline Hcl] Nausea Only        Amoxapine And Related Rash   Cephalexin Rash   Oxycodone     "Loopy"   Penicillins Rash    Objective:   BP 131/73   Pulse 98   Temp 97.9 F (36.6 C) (Temporal)   Ht 5' 1.5" (1.562 m)   Wt 138 lb 6.4 oz (62.8 kg)   SpO2 99%   BMI 25.73 kg/m    Physical Exam Vitals reviewed.  Constitutional:      General: She is not in acute distress.    Appearance: Normal appearance. She is not ill-appearing, toxic-appearing or diaphoretic.  HENT:     Head: Normocephalic and atraumatic.  Eyes:     General: No scleral icterus.       Right eye: No discharge.        Left eye: No discharge.     Conjunctiva/sclera: Conjunctivae normal.  Cardiovascular:     Rate and Rhythm: Normal rate.  Pulmonary:     Effort: Pulmonary effort is normal. No respiratory distress.  Musculoskeletal:        General: Normal range of motion.     Cervical back: Normal range of motion.  Skin:    General: Skin is warm and dry.     Capillary Refill: Capillary refill takes less than 2 seconds.  Neurological:     General: No focal deficit present.     Mental Status: She is alert and oriented to person, place, and time. Mental status is at baseline.  Psychiatric:        Mood and Affect: Mood normal.        Behavior: Behavior normal.        Thought Content: Thought content normal.        Judgment: Judgment normal.

## 2022-02-14 LAB — URINE CULTURE

## 2022-02-15 ENCOUNTER — Other Ambulatory Visit: Payer: Self-pay | Admitting: Family Medicine

## 2022-02-15 DIAGNOSIS — N3001 Acute cystitis with hematuria: Secondary | ICD-10-CM

## 2022-02-15 MED ORDER — NITROFURANTOIN MONOHYD MACRO 100 MG PO CAPS: 100.0000 mg | ORAL_CAPSULE | Freq: Two times a day (BID) | ORAL | 0 refills | Status: AC

## 2022-03-01 ENCOUNTER — Other Ambulatory Visit: Payer: Self-pay | Admitting: Family Medicine

## 2022-03-01 DIAGNOSIS — Z780 Asymptomatic menopausal state: Secondary | ICD-10-CM

## 2022-03-13 ENCOUNTER — Other Ambulatory Visit: Payer: Self-pay | Admitting: Family Medicine

## 2022-03-13 DIAGNOSIS — J301 Allergic rhinitis due to pollen: Secondary | ICD-10-CM

## 2022-04-05 ENCOUNTER — Encounter: Payer: Self-pay | Admitting: Nurse Practitioner

## 2022-04-05 ENCOUNTER — Ambulatory Visit (INDEPENDENT_AMBULATORY_CARE_PROVIDER_SITE_OTHER): Payer: Medicare Other | Admitting: Nurse Practitioner

## 2022-04-05 VITALS — BP 144/83 | HR 91 | Temp 98.1°F | Resp 20 | Ht 61.0 in | Wt 138.0 lb

## 2022-04-05 DIAGNOSIS — N3000 Acute cystitis without hematuria: Secondary | ICD-10-CM

## 2022-04-05 DIAGNOSIS — R300 Dysuria: Secondary | ICD-10-CM | POA: Diagnosis not present

## 2022-04-05 LAB — MICROSCOPIC EXAMINATION
RBC, Urine: NONE SEEN /hpf (ref 0–2)
Renal Epithel, UA: NONE SEEN /hpf
WBC, UA: >30 /hpf — AB (ref 0–5)

## 2022-04-05 LAB — URINALYSIS, COMPLETE
Bilirubin, UA: NEGATIVE
Glucose, UA: NEGATIVE
Ketones, UA: NEGATIVE
Nitrite, UA: POSITIVE — AB
RBC, UA: NEGATIVE
Specific Gravity, UA: 1.01 (ref 1.005–1.030)
Urobilinogen, Ur: 0.2 mg/dL (ref 0.2–1.0)
pH, UA: 7 (ref 5.0–7.5)

## 2022-04-05 MED ORDER — DOXYCYCLINE HYCLATE 100 MG PO TABS: 100.0000 mg | ORAL_TABLET | Freq: Two times a day (BID) | ORAL | 0 refills | Status: DC

## 2022-04-05 NOTE — Patient Instructions (Signed)
Urinary Tract Infection, Adult A urinary tract infection (UTI) is an infection of any part of the urinary tract. The urinary tract includes: The kidneys. The ureters. The bladder. The urethra. These organs make, store, and get rid of pee (urine) in the body. What are the causes? This infection is caused by germs (bacteria) in your genital area. These germs grow and cause swelling (inflammation) of your urinary tract. What increases the risk? The following factors may make you more likely to develop this condition: Using a small, thin tube (catheter) to drain pee. Not being able to control when you pee or poop (incontinence). Being female. If you are female, these things can increase the risk: Using these methods to prevent pregnancy: A medicine that kills sperm (spermicide). A device that blocks sperm (diaphragm). Having low levels of a female hormone (estrogen). Being pregnant. You are more likely to develop this condition if: You have genes that add to your risk. You are sexually active. You take antibiotic medicines. You have trouble peeing because of: A prostate that is bigger than normal, if you are female. A blockage in the part of your body that drains pee from the bladder. A kidney stone. A nerve condition that affects your bladder. Not getting enough to drink. Not peeing often enough. You have other conditions, such as: Diabetes. A weak disease-fighting system (immune system). Sickle cell disease. Gout. Injury of the spine. What are the signs or symptoms? Symptoms of this condition include: Needing to pee right away. Peeing small amounts often. Pain or burning when peeing. Blood in the pee. Pee that smells bad or not like normal. Trouble peeing. Pee that is cloudy. Fluid coming from the vagina, if you are female. Pain in the belly or lower back. Other symptoms include: Vomiting. Not feeling hungry. Feeling mixed up (confused). This may be the first symptom in  older adults. Being tired and grouchy (irritable). A fever. Watery poop (diarrhea). How is this treated? Taking antibiotic medicine. Taking other medicines. Drinking enough water. In some cases, you may need to see a specialist. Follow these instructions at home:  Medicines Take over-the-counter and prescription medicines only as told by your doctor. If you were prescribed an antibiotic medicine, take it as told by your doctor. Do not stop taking it even if you start to feel better. General instructions Make sure you: Pee until your bladder is empty. Do not hold pee for a long time. Empty your bladder after sex. Wipe from front to back after peeing or pooping if you are a female. Use each tissue one time when you wipe. Drink enough fluid to keep your pee pale yellow. Keep all follow-up visits. Contact a doctor if: You do not get better after 1-2 days. Your symptoms go away and then come back. Get help right away if: You have very bad back pain. You have very bad pain in your lower belly. You have a fever. You have chills. You feeling like you will vomit or you vomit. Summary A urinary tract infection (UTI) is an infection of any part of the urinary tract. This condition is caused by germs in your genital area. There are many risk factors for a UTI. Treatment includes antibiotic medicines. Drink enough fluid to keep your pee pale yellow. This information is not intended to replace advice given to you by your health care provider. Make sure you discuss any questions you have with your health care provider. Document Revised: 01/18/2020 Document Reviewed: 01/18/2020 Elsevier Patient Education    2023 [employer] Inc[employer]  

## 2022-04-05 NOTE — Progress Notes (Signed)
   Subjective:    Patient ID: Carmen Green, female    DOB: Apr 07, 1951, 71 y.o.   MRN: [id number]   Chief Complaint: Dysuria   Dysuria  This is a new problem. The current episode started yesterday. The problem occurs every urination. The quality of the pain is described as burning. The pain is at a severity of 9/10. The pain is moderate. There has been no fever. She is Not sexually active. There is No history of pyelonephritis. Associated symptoms include frequency and urgency. She has tried nothing for the symptoms. The treatment provided mild relief.       Review of Systems  Genitourinary:  Positive for dysuria, frequency and urgency.       Objective:   Physical Exam Vitals reviewed.  Constitutional:      Appearance: Normal appearance.  Cardiovascular:     Rate and Rhythm: Normal rate and regular rhythm.     Heart sounds: Normal heart sounds.  Pulmonary:     Effort: Pulmonary effort is normal.     Breath sounds: Normal breath sounds.  Skin:    General: Skin is warm.  Neurological:     General: No focal deficit present.     Mental Status: She is alert and oriented to person, place, and time.  Psychiatric:        Mood and Affect: Mood normal.        Behavior: Behavior normal.    BP (!) 144/83   Pulse 91   Temp 98.1 F (36.7 C) (Temporal)   Resp 20   Ht '5\' 1"'$  (1.549 m)   Wt 138 lb (62.6 kg)   SpO2 100%   BMI 26.07 kg/m         Assessment & Plan:   [name] in today with chief complaint of Dysuria   1. Dysuria  - Urinalysis, Complete  2. Acute cystitis without hematuria Take medication as prescribe Cotton underwear Take shower not bath Cranberry juice, yogurt Force fluids AZO over the counter X2 days Culture pending RTO prn  - doxycycline (VIBRA-TABS) 100 MG tablet; Take 1 tablet (100 mg total) by mouth 2 (two) times daily.  Dispense: 20 tablet; Refill: 0 - Urine Culture    The above assessment and management plan was discussed  with the patient. The patient verbalized understanding of and has agreed to the management plan. Patient is aware to call the clinic if symptoms persist or worsen. Patient is aware when to return to the clinic for a follow-up visit. Patient educated on when it is appropriate to go to the emergency department.   Mary-Margaret Hassell Done, FNP

## 2022-04-08 LAB — URINE CULTURE

## 2022-04-16 DIAGNOSIS — N3020 Other chronic cystitis without hematuria: Secondary | ICD-10-CM | POA: Diagnosis not present

## 2022-04-16 DIAGNOSIS — N952 Postmenopausal atrophic vaginitis: Secondary | ICD-10-CM | POA: Diagnosis not present

## 2022-04-21 ENCOUNTER — Ambulatory Visit (INDEPENDENT_AMBULATORY_CARE_PROVIDER_SITE_OTHER): Payer: Medicare Other

## 2022-04-21 DIAGNOSIS — I442 Atrioventricular block, complete: Secondary | ICD-10-CM

## 2022-04-21 LAB — CUP PACEART REMOTE DEVICE CHECK
Battery Remaining Longevity: 97 mo
Battery Remaining Percentage: 80 %
Battery Voltage: 3.01 V
Brady Statistic AP VP Percent: 8.6 %
Brady Statistic AP VS Percent: 1 %
Brady Statistic AS VP Percent: 88 %
Brady Statistic AS VS Percent: 1.6 %
Brady Statistic RA Percent Paced: 6.8 %
Brady Statistic RV Percent Paced: 96 %
Date Time Interrogation Session: 20231101023833
Implantable Lead Connection Status: 753985
Implantable Lead Connection Status: 753985
Implantable Lead Implant Date: 20210803
Implantable Lead Implant Date: 20210803
Implantable Lead Location: 753859
Implantable Lead Location: 753860
Implantable Pulse Generator Implant Date: 20210803
Lead Channel Impedance Value: 400 Ohm
Lead Channel Impedance Value: 460 Ohm
Lead Channel Pacing Threshold Amplitude: 0.75 V
Lead Channel Pacing Threshold Amplitude: 1 V
Lead Channel Pacing Threshold Pulse Width: 0.4 ms
Lead Channel Pacing Threshold Pulse Width: 0.5 ms
Lead Channel Sensing Intrinsic Amplitude: 2.4 mV
Lead Channel Sensing Intrinsic Amplitude: 6.3 mV
Lead Channel Setting Pacing Amplitude: 1.25 V
Lead Channel Setting Pacing Amplitude: 2 V
Lead Channel Setting Pacing Pulse Width: 0.5 ms
Lead Channel Setting Sensing Sensitivity: 4 mV
Pulse Gen Model: 2272
Pulse Gen Serial Number: 3852139

## 2022-05-04 NOTE — Progress Notes (Signed)
Remote pacemaker transmission.   

## 2022-05-11 ENCOUNTER — Ambulatory Visit (INDEPENDENT_AMBULATORY_CARE_PROVIDER_SITE_OTHER): Payer: Medicare Other

## 2022-05-11 DIAGNOSIS — Z23 Encounter for immunization: Secondary | ICD-10-CM

## 2022-05-26 NOTE — Progress Notes (Deleted)
Electrophysiology Office Note Date: 05/26/2022  ID:  Carmen Green, DOB 11-Sep-1950, MRN [id number]  PCP: Carmen Norlander, DO Primary Cardiologist: None Electrophysiologist: Carmen Green -> Dr. Myles Green    CC: Pacemaker follow-up  Carmen Green is a 71 y.o. female seen today for Carmen Quitter, MD for routine electrophysiology followup. Since last being seen in our clinic the patient reports doing ***.  she denies chest pain, palpitations, dyspnea, PND, orthopnea, nausea, vomiting, dizziness, syncope, edema, weight gain, or early satiety.   Device History: St. Jude Dual Chamber PPM implanted [id number] for CHB, gen change 2004, gen change 2012, Abandoned R side device and L sided device implant 01/2020   Past Medical History:  Diagnosis Date   Allergy    Anemia    Anxiety    Arthritis    Cataract    bil cataracts removed   Colon polyp    Complete heart block ([place])    Contusion of chest wall 10/10/2007   Centricity Description: CHEST WALL CONTUSION Qualifier: Diagnosis of  By: Carmen Brochure MD, Carmen Green   Centricity Description: CONTUSION, RIGHT RIB Qualifier: Diagnosis of  By: Carmen Brochure MD, Carmen Green     Depression    Diverticulitis    Esophagitis    ESOPHAGITIS 06/25/2008   Qualifier: History of  By: Carmen Green CMA (AAMA), Carmen Green     GERD (gastroesophageal reflux disease)    H/O seasonal allergies    Heart murmur    Hiatal hernia    Hyperlipidemia    s/p PPM implant by Dr Carmen Green 1992 with generator change 2004 (S[facility]), she is device dependant   Hypertension    Mitral valve prolapse    Pacemaker    Past Surgical History:  Procedure Laterality Date   APPENDECTOMY     BREAST EXCISIONAL BIOPSY Right 07/08/1998   BREAST SURGERY Right    biopsy   COLONOSCOPY  10/19/2004   [place]   EYE SURGERY Left    cataract extraction with IOL   LAPAROSCOPIC LOW ANTERIOR RESECTION  11/16/2013   Robotic   LAPAROSCOPIC LYSIS OF ADHESIONS N/A 11/15/2013   Procedure: LAPAROSCOPIC LYSIS OF  ADHESIONS;  Surgeon: Carmen Hector, MD;  Location: [facility];  Service: General;  Laterality: N/A;   left ovary cyst removal  [place] N/A 01/22/2020   Procedure: PACEMAKER IMPLANT;  Surgeon: Carmen Grayer, MD;  Location: [facility];  Service: Cardiovascular;  Laterality: N/A;   [place], 2004, 2012   device dependant   POLYPECTOMY     PROCTOSCOPY  11/15/2013   Procedure: RIGID PROCTOSCOPY;  Surgeon: Carmen Hector, MD;  Location: [facility];  Service: General;;   ROBOTIC ASSISTED SALPINGO OOPHERECTOMY Left 11/16/2013   Robotic en bloc w LAR resection   TUBAL LIGATION     UPPER GASTROINTESTINAL ENDOSCOPY      Current Outpatient Medications  Medication Sig Dispense Refill   Ascorbic Acid (VITAMIN C) 1000 MG tablet Take 1,000 mg by mouth daily.     buPROPion (WELLBUTRIN XL) 300 MG 24 hr tablet TAKE 1 TABLET DAILY 90 tablet 1   busPIRone (BUSPAR) 15 MG tablet TAKE 1 TABLET 3 TIMES A DAY 270 tablet 1   cholecalciferol (VITAMIN D) 25 MCG (1000 UNIT) tablet Take 1,000 Units by mouth daily.      D-MANNOSE PO Take 1 tablet by mouth daily.     doxycycline (VIBRA-TABS) 100 MG tablet Take 1 tablet (100 mg total) by mouth 2 (two) times daily.  20 tablet 0   esomeprazole (NEXIUM) 40 MG capsule TAKE ONE CAPSULE DAILY AT NOON 90 capsule 3   estradiol (ESTRACE) 0.1 MG/GM vaginal cream INSERT 1 GRAM VAGINALLY AT BEDTIME FOR 2 WEEKS, THEN TWICE A WEEK THEREAFTER     fluticasone (FLONASE) 50 MCG/ACT nasal spray 1 SPRAY IN EACH NOSTRIL DAILY AS NEEDED FOR ALLERGIES 16 g 3   guaiFENesin (MUCINEX) 600 MG 12 hr tablet Take 600 mg by mouth 2 (two) times daily as needed for cough.     levocetirizine (XYZAL) 5 MG tablet Take 1 tablet (5 mg total) by mouth every evening. 90 tablet 3   lisinopril-hydrochlorothiazide (ZESTORETIC) 20-12.5 MG tablet TAKE 1 TABLET EVERY MORNING 90 tablet 1   LORazepam (ATIVAN) 1 MG tablet Take 1 tablet every night at bedtime.  May take 1/2 tablet during  daytime if needed. 135 tablet 1   meclizine (ANTIVERT) 25 MG tablet Take 1 tablet (25 mg total) by mouth 3 (three) times daily as needed for dizziness. 30 tablet 0   medroxyPROGESTERone (PROVERA) 2.5 MG tablet TAKE ONE TABLET BY MOUTH ONCE DAILY 90 tablet 0   Multiple Vitamin (MULTIVITAMIN) tablet Take 1 tablet by mouth daily.       nitrofurantoin (MACRODANTIN) 50 MG capsule Take 50 mg by mouth 4 (four) times daily.     polyethylene glycol powder (GLYCOLAX/MIRALAX) powder Take 1 Container by mouth daily as needed for mild constipation.     rosuvastatin (CRESTOR) 10 MG tablet TAKE 1 TABLET DAILY 90 tablet 1   trolamine salicylate (ASPERCREME) 10 % cream Apply 1 application  topically as needed for muscle pain.     Zinc Sulfate (ZINC 15 PO) Take by mouth.     No current facility-administered medications for this visit.    Allergies:   Zoloft [sertraline hcl], Amoxapine and related, Cephalexin, Oxycodone, and Penicillins   Social History: Social History   Socioeconomic History   Marital status: Married    Spouse name: Carmen Green   Number of children: 1   Years of education: Not on file   Highest education level: Not on file  Occupational History   Occupation: REGISTRATION   Occupation: retired  Tobacco Use   Smoking status: Never   Smokeless tobacco: Never  Vaping Use   Vaping Use: Never used  Substance and Sexual Activity   Alcohol use: No   Drug use: No   Sexual activity: Not on file  Other Topics Concern   Not on file  Social History Narrative   Lives with spouse in [place] Strain: [place]  (01/08/2022)   Overall Financial Resource Strain (CARDIA)    Difficulty of Paying Living Expenses: Not hard at all  Food Insecurity: No Food Insecurity (01/08/2022)   Hunger Vital Sign    Worried About Running Out of Food in the Last Year: Never true    [place] in the Last Year: Never true  Transportation Needs: No  Transportation Needs (01/08/2022)   PRAPARE - Hydrologist (Medical): No    Lack of Transportation (Non-Medical): No  Physical Activity: Insufficiently Active (01/08/2022)   Exercise Vital Sign    Days of Exercise per Week: 1 day    Minutes of Exercise per Session: 30 min  Stress: No Stress Concern Present (01/08/2022)   [place]    Feeling of Stress : Not at all  Social  Connections: Socially Integrated (01/08/2022)   Social Connection and Isolation Panel [NHANES]    Frequency of Communication with Friends and Family: More than three times a week    Frequency of Social Gatherings with Friends and Family: More than three times a week    Attends Religious Services: More than 4 times per year    Active Member of [employer] or Organizations: Yes    Attends Music therapist: More than 4 times per year    Marital Status: Married  Human resources officer Violence: Not At Risk (01/08/2022)   Humiliation, Afraid, Rape, and Kick questionnaire    Fear of Current or Ex-Partner: No    Emotionally Abused: No    Physically Abused: No    Sexually Abused: No    Family History: Family History  Problem Relation Age of Onset   Diabetes Mother    Stroke Mother    Diabetes Father    Lung cancer Father    Heart disease Maternal Aunt    Breast cancer Maternal Aunt    Heart disease Maternal Grandmother    Lung cancer Maternal Grandfather    Colon cancer Neg Hx    Esophageal cancer Neg Hx    Pancreatic cancer Neg Hx    Rectal cancer Neg Hx    Stomach cancer Neg Hx      Review of Systems: All other systems reviewed and are otherwise negative except as noted above.  Physical Exam: There were no vitals filed for this visit.   GEN- The patient is well appearing, alert and oriented x 3 today.   HEENT: normocephalic, atraumatic; sclera clear, conjunctiva pink; hearing intact; oropharynx clear; neck  supple, no JVP Lymph- no cervical lymphadenopathy Lungs- Clear to ausculation bilaterally, normal work of breathing.  No wheezes, rales, rhonchi Heart- {Blank single:19197::"Regular","Irregularly irregular"}  rate and rhythm, no murmurs, rubs or gallops, PMI not laterally displaced GI- soft, non-tender, non-distended, bowel sounds present, no hepatosplenomegaly Extremities- no clubbing or cyanosis. {EDEMA HALPF:79024} peripheral edema; DP/PT/radial pulses 2+ bilaterally MS- no significant deformity or atrophy Skin- warm and dry, no rash or lesion; PPM pocket well healed Psych- euthymic mood, full affect Neuro- strength and sensation are intact  PPM Interrogation-  reviewed in detail today,  See PACEART report.  EKG:  EKG {ACTION; IS/IS OXB:[id number]} ordered today. Personal review of ekg ordered {Blank single:19197::"today","***"} shows ***   Recent Labs: 07/06/2021: BUN 10; Creatinine, Ser 0.93; Hemoglobin 13.5; Platelets 261; Potassium 4.7; Sodium 135; TSH 0.849 09/30/2021: ALT 18   Wt Readings from Last 3 Encounters:  04/05/22 138 lb (62.6 kg)  02/12/22 138 lb 6.4 oz (62.8 kg)  01/08/22 138 lb (62.6 kg)     Other studies Reviewed: Additional studies/ records that were reviewed today include: Previous EP office notes, Previous remote checks, Most recent labwork.     Assessment and Plan:  1. CHB s/p St. Jude PPM ( R side abandoned, L sided implant 01/2020) Normal PPM function See Pace Art report No changes today   Current medicines are reviewed at length with the patient today.    Labs/ tests ordered today include: *** No orders of the defined types were placed in this encounter.   Disposition:   Follow up with {EPPROVIDERS:28135} in {Blank single:19197::"2 weeks","4 weeks","3 months","6 months","12 months","as usual post gen change"}    Signed, Shirley Friar, PA-C  05/26/2022 11:16 AM  [facility] [address] [place]  [address] [phone] (office) [phone] (fax)

## 2022-05-27 ENCOUNTER — Ambulatory Visit (INDEPENDENT_AMBULATORY_CARE_PROVIDER_SITE_OTHER): Payer: Medicare Other | Admitting: Family Medicine

## 2022-05-27 ENCOUNTER — Encounter: Payer: Self-pay | Admitting: Family Medicine

## 2022-05-27 ENCOUNTER — Ambulatory Visit (INDEPENDENT_AMBULATORY_CARE_PROVIDER_SITE_OTHER): Payer: Medicare Other

## 2022-05-27 VITALS — BP 127/69 | HR 88 | Temp 98.2°F | Ht 61.0 in | Wt 139.2 lb

## 2022-05-27 DIAGNOSIS — R0602 Shortness of breath: Secondary | ICD-10-CM

## 2022-05-27 DIAGNOSIS — J0140 Acute pansinusitis, unspecified: Secondary | ICD-10-CM

## 2022-05-27 MED ORDER — DOXYCYCLINE HYCLATE 100 MG PO TABS: 100.0000 mg | ORAL_TABLET | Freq: Two times a day (BID) | ORAL | 0 refills | Status: AC

## 2022-05-27 NOTE — Progress Notes (Addendum)
Acute Office Visit  Subjective:     Patient ID: Carmen Green, female    DOB: 04-30-1951, 71 y.o.   MRN: [id number]  Chief Complaint  Patient presents with   Nasal Congestion    HPI Patient is in today for nasal congestion for 10-14 days without improvement in symptoms. She reports chest congestion but doesn't really have a cough. She has a lot of facial pressure. She reports that her husband has told her that she has been wheezing some. She feels like she can't get a good breath and has been more fatigued than usual and shortness of breath with activity. She denies productive cough, chest pain, edema, or fever. She had pneumonia years ago. She has had bronchitis in the past. Denies COPD or asthma. She has been taking mucinex, advil, and flonase with some improvement.    ROS As per HPI.      Objective:    BP 127/69   Pulse 88   Temp 98.2 F (36.8 C) (Temporal)   Ht '5\' 1"'$  (1.549 m)   Wt 139 lb 4 oz (63.2 kg)   SpO2 100%   BMI 26.31 kg/m    Physical Exam Vitals and nursing note reviewed.  Constitutional:      General: She is not in acute distress.    Appearance: She is not ill-appearing, toxic-appearing or diaphoretic.  HENT:     Right Ear: Tympanic membrane, ear canal and external ear normal.     Left Ear: Tympanic membrane, ear canal and external ear normal.     Nose: Congestion present.     Right Sinus: No maxillary sinus tenderness or frontal sinus tenderness.     Left Sinus: No maxillary sinus tenderness or frontal sinus tenderness.     Mouth/Throat:     Pharynx: Posterior oropharyngeal erythema present. No pharyngeal swelling or oropharyngeal exudate.     Tonsils: No tonsillar exudate or tonsillar abscesses. 1+ on the right. 1+ on the left.  Eyes:     General:        Right eye: No discharge.        Left eye: No discharge.     Conjunctiva/sclera: Conjunctivae normal.  Cardiovascular:     Rate and Rhythm: Normal rate and regular rhythm.     Heart sounds:  Normal heart sounds. No murmur heard. Pulmonary:     Effort: Pulmonary effort is normal. No respiratory distress.     Breath sounds: Normal breath sounds.  Abdominal:     General: There is no distension.     Palpations: Abdomen is soft.     Tenderness: There is no abdominal tenderness. There is no guarding or rebound.  Musculoskeletal:     Cervical back: No rigidity.     Right lower leg: No edema.     Left lower leg: No edema.  Lymphadenopathy:     Cervical: No cervical adenopathy.  Skin:    General: Skin is warm and dry.  Neurological:     General: No focal deficit present.     Mental Status: She is alert and oriented to person, place, and time.  Psychiatric:        Mood and Affect: Mood normal.        Behavior: Behavior normal.     No results found for any visits on 05/27/22.      Assessment & Plan:   Carmen Green was seen today for nasal congestion.  Diagnoses and all orders for this visit:  Shortness of breath  CXR negative for pneumonia or pulmonary edema. She has follow up with cardiology in the next few weeks. -     DG Chest 2 View; Future  Acute non-recurrent pansinusitis Doxycyline as below. Discussed symptomatic care and return precautions.  -     doxycycline (VIBRA-TABS) 100 MG tablet; Take 1 tablet (100 mg total) by mouth 2 (two) times daily for 7 days.  Return if symptoms worsen or fail to improve.  The patient indicates understanding of these issues and agrees with the plan.  Gwenlyn Perking, FNP

## 2022-05-28 ENCOUNTER — Ambulatory Visit: Payer: Medicare Other

## 2022-05-28 ENCOUNTER — Encounter: Payer: Medicare Other | Admitting: Student

## 2022-05-31 ENCOUNTER — Other Ambulatory Visit: Payer: Self-pay | Admitting: Family Medicine

## 2022-05-31 DIAGNOSIS — Z780 Asymptomatic menopausal state: Secondary | ICD-10-CM

## 2022-06-16 ENCOUNTER — Encounter: Payer: Self-pay | Admitting: Family Medicine

## 2022-06-16 ENCOUNTER — Ambulatory Visit (INDEPENDENT_AMBULATORY_CARE_PROVIDER_SITE_OTHER): Payer: Medicare Other | Admitting: Family Medicine

## 2022-06-16 VITALS — BP 144/72 | HR 97 | Temp 97.7°F | Ht 61.0 in | Wt 139.4 lb

## 2022-06-16 DIAGNOSIS — R630 Anorexia: Secondary | ICD-10-CM | POA: Diagnosis not present

## 2022-06-16 DIAGNOSIS — F411 Generalized anxiety disorder: Secondary | ICD-10-CM | POA: Diagnosis not present

## 2022-06-16 DIAGNOSIS — F410 Panic disorder [episodic paroxysmal anxiety] without agoraphobia: Secondary | ICD-10-CM

## 2022-06-16 DIAGNOSIS — I10 Essential (primary) hypertension: Secondary | ICD-10-CM

## 2022-06-16 DIAGNOSIS — Z79899 Other long term (current) drug therapy: Secondary | ICD-10-CM

## 2022-06-16 MED ORDER — MIRTAZAPINE 7.5 MG PO TABS: 7.5000 mg | ORAL_TABLET | Freq: Every day | ORAL | 0 refills | Status: DC

## 2022-06-16 MED ORDER — MEDROXYPROGESTERONE ACETATE 2.5 MG PO TABS: 2.5000 mg | ORAL_TABLET | Freq: Every day | ORAL | 3 refills | Status: DC

## 2022-06-16 MED ORDER — LISINOPRIL-HYDROCHLOROTHIAZIDE 20-12.5 MG PO TABS: ORAL_TABLET | ORAL | 1 refills | Status: DC

## 2022-06-16 MED ORDER — LORAZEPAM 1 MG PO TABS: ORAL_TABLET | ORAL | 1 refills | Status: DC

## 2022-06-16 MED ORDER — ROSUVASTATIN CALCIUM 10 MG PO TABS: 10.0000 mg | ORAL_TABLET | Freq: Every day | ORAL | 1 refills | Status: DC

## 2022-06-16 MED ORDER — BUSPIRONE HCL 15 MG PO TABS: ORAL_TABLET | ORAL | 1 refills | Status: DC

## 2022-06-16 MED ORDER — BUPROPION HCL ER (XL) 300 MG PO TB24: 300.0000 mg | ORAL_TABLET | Freq: Every day | ORAL | 1 refills | Status: DC

## 2022-06-16 NOTE — Progress Notes (Signed)
Subjective: CC:GAD w/ panic attacks PCP: [name], DO WSF:KCLEXN DESHEA POOLEY is a 71 y.o. female presenting to clinic today for:  1. GAD w/ panic She reports that she has been having some increased frequency of panic attacks such that she has been taking a full milligram of Ativan both morning and nighttime over the last several days.  She is treated with Wellbutrin, buspirone.  She reports low appetite, decreased motivation.  She does note that she has a lot of stress related to her husband's health and mood as well as her daughter.  She feels like she has to tiptoe around her husband quite a bit so as not to cause tension.  She feels guilty for "complaining" about these issues but they have been causing quite a bit of somatic symptoms including feeling like she cannot take a deep breath and sometimes.  This is alleviated by the Ativan but recurs intermittently.  No chest pain associated with this.  No hemoptysis.  She does not report any fevers or wheezing but has had some intermittent nasal congestion.  Status post treatment with oral antibiotics for this sinus issue.  Chest x-ray was obtained and this was negative for any acute issues.  She has an appointment with her cardiologist soon   ROS: Per HPI  Allergies  Allergen Reactions   Zoloft [Sertraline Hcl] Nausea Only        Amoxapine And Related Rash   Cephalexin Rash   Oxycodone     "Loopy"   Penicillins Rash   Past Medical History:  Diagnosis Date   Allergy    Anemia    Anxiety    Arthritis    Cataract    bil cataracts removed   Colon polyp    Complete heart block ([place])    Contusion of chest wall 10/10/2007   Centricity Description: CHEST WALL CONTUSION Qualifier: Diagnosis of  By: Aline Brochure MD, Dorothyann Peng   Centricity Description: CONTUSION, RIGHT RIB Qualifier: Diagnosis of  By: Aline Brochure MD, [place]     Depression    Diverticulitis    Esophagitis    ESOPHAGITIS 06/25/2008   Qualifier: History of  By:  Harlon Ditty CMA (AAMA), Dottie     GERD (gastroesophageal reflux disease)    H/O seasonal allergies    Heart murmur    Hiatal hernia    Hyperlipidemia    s/p PPM implant by Dr Olevia Perches 1992 with generator change 2004 (SJM), she is device dependant   Hypertension    Mitral valve prolapse    Pacemaker     Current Outpatient Medications:    Ascorbic Acid (VITAMIN C) 1000 MG tablet, Take 1,000 mg by mouth daily., Disp: , Rfl:    buPROPion (WELLBUTRIN XL) 300 MG 24 hr tablet, TAKE 1 TABLET DAILY, Disp: 90 tablet, Rfl: 1   busPIRone (BUSPAR) 15 MG tablet, TAKE 1 TABLET 3 TIMES A DAY, Disp: 270 tablet, Rfl: 1   cholecalciferol (VITAMIN D) 25 MCG (1000 UNIT) tablet, Take 1,000 Units by mouth daily. , Disp: , Rfl:    D-MANNOSE PO, Take 1 tablet by mouth daily., Disp: , Rfl:    esomeprazole (NEXIUM) 40 MG capsule, TAKE ONE CAPSULE DAILY AT NOON, Disp: 90 capsule, Rfl: 3   estradiol (ESTRACE) 0.1 MG/GM vaginal cream, INSERT 1 GRAM VAGINALLY AT BEDTIME FOR 2 WEEKS, THEN TWICE A WEEK THEREAFTER, Disp: , Rfl:    fluticasone (FLONASE) 50 MCG/ACT nasal spray, 1 SPRAY IN EACH NOSTRIL DAILY AS NEEDED FOR ALLERGIES, Disp: 16 g,  Rfl: 3   guaiFENesin (MUCINEX) 600 MG 12 hr tablet, Take 600 mg by mouth 2 (two) times daily as needed for cough., Disp: , Rfl:    levocetirizine (XYZAL) 5 MG tablet, Take 1 tablet (5 mg total) by mouth every evening., Disp: 90 tablet, Rfl: 3   lisinopril-hydrochlorothiazide (ZESTORETIC) 20-12.5 MG tablet, TAKE 1 TABLET EVERY MORNING, Disp: 90 tablet, Rfl: 1   LORazepam (ATIVAN) 1 MG tablet, Take 1 tablet every night at bedtime.  May take 1/2 tablet during daytime if needed., Disp: 135 tablet, Rfl: 1   meclizine (ANTIVERT) 25 MG tablet, Take 1 tablet (25 mg total) by mouth 3 (three) times daily as needed for dizziness., Disp: 30 tablet, Rfl: 0   medroxyPROGESTERone (PROVERA) 2.5 MG tablet, TAKE ONE TABLET BY MOUTH ONCE DAILY, Disp: 90 tablet, Rfl: 0   methenamine (HIPREX) 1 g tablet,  Take 1 g by mouth 2 (two) times daily., Disp: , Rfl:    polyethylene glycol powder (GLYCOLAX/MIRALAX) powder, Take 1 Container by mouth daily as needed for mild constipation., Disp: , Rfl:    rosuvastatin (CRESTOR) 10 MG tablet, TAKE 1 TABLET DAILY, Disp: 90 tablet, Rfl: 1   trolamine salicylate (ASPERCREME) 10 % cream, Apply 1 application  topically as needed for muscle pain., Disp: , Rfl:    Zinc Sulfate (ZINC 15 PO), Take by mouth., Disp: , Rfl:  Social History   Socioeconomic History   Marital status: Married    Spouse name: Gwyndolyn Saxon   Number of children: 1   Years of education: Not on file   Highest education level: Not on file  Occupational History   Occupation: REGISTRATION   Occupation: retired  Tobacco Use   Smoking status: Never   Smokeless tobacco: Never  Vaping Use   Vaping Use: Never used  Substance and Sexual Activity   Alcohol use: No   Drug use: No   Sexual activity: Not on file  Other Topics Concern   Not on file  Social History Narrative   Lives with spouse in [place]   Social Determinants of Health   Financial Resource Strain: Low Risk  (01/08/2022)   Overall Financial Resource Strain (CARDIA)    Difficulty of Paying Living Expenses: Not hard at all  Food Insecurity: No Food Insecurity (01/08/2022)   Hunger Vital Sign    Worried About Running Out of Food in the Last Year: Never true    [place] in the Last Year: Never true  Transportation Needs: No Transportation Needs (01/08/2022)   PRAPARE - Hydrologist (Medical): No    Lack of Transportation (Non-Medical): No  Physical Activity: Insufficiently Active (01/08/2022)   Exercise Vital Sign    Days of Exercise per Week: 1 day    Minutes of Exercise per Session: 30 min  Stress: No Stress Concern Present (01/08/2022)   [place]    Feeling of Stress : Not at all  Social Connections: [place]  (01/08/2022)   Social Connection and Isolation Panel [NHANES]    Frequency of Communication with Friends and Family: More than three times a week    Frequency of Social Gatherings with Friends and Family: More than three times a week    Attends Religious Services: More than 4 times per year    Active Member of [employer] or Organizations: Yes    Attends Archivist Meetings: More than 4 times per year    Marital  Status: Married  Human resources officer Violence: Not At Risk (01/08/2022)   Humiliation, Afraid, Rape, and Kick questionnaire    Fear of Current or Ex-Partner: No    Emotionally Abused: No    Physically Abused: No    Sexually Abused: No   Family History  Problem Relation Age of Onset   Diabetes Mother    Stroke Mother    Diabetes Father    Lung cancer Father    Heart disease Maternal Aunt    Breast cancer Maternal Aunt    Heart disease Maternal Grandmother    Lung cancer Maternal Grandfather    Colon cancer Neg Hx    Esophageal cancer Neg Hx    Pancreatic cancer Neg Hx    Rectal cancer Neg Hx    Stomach cancer Neg Hx     Objective: Office vital signs reviewed. BP (!) 146/83   Pulse 97   Temp 97.7 F (36.5 C)   Ht '5\' 1"'$  (1.549 m)   Wt 139 lb 6.4 oz (63.2 kg)   SpO2 99%   BMI 26.34 kg/m   Physical Examination:  General: Awake, alert, well nourished, No acute distress HEENT: Sclera white.  Nasal turbinates moist and without any purulent discharge Cardio: regular rate and rhythm, S1S2 heard, no murmurs appreciated Pulm: clear to auscultation bilaterally, no wheezes, rhonchi or rales; normal work of breathing on room air Psych: Anxious appearing     06/16/2022   10:13 AM 05/27/2022    2:09 PM 04/05/2022   10:59 AM  Depression screen PHQ 2/9  Decreased Interest 0 0 0  Down, Depressed, Hopeless 0 0 0  PHQ - 2 Score 0 0 0  Altered sleeping 0 0 0  Tired, decreased energy '1 2 2  '$ Change in appetite 1 0 0  Feeling bad or failure about yourself  0 0 0  Trouble  concentrating 0 0 0  Moving slowly or fidgety/restless 0 0 0  Suicidal thoughts 0 0 0  PHQ-9 Score '2 2 2  '$ Difficult doing work/chores Somewhat difficult Somewhat difficult Not difficult at all      06/16/2022   10:24 AM 05/27/2022    2:11 PM 04/05/2022   11:00 AM 12/15/2021    3:56 PM  GAD 7 : Generalized Anxiety Score  Nervous, Anxious, on Edge 1 2 0 0  Control/stop worrying 0 0 0 0  Worry too much - different things 0 0 0 0  Trouble relaxing 0 0 0 0  Restless 0 0 0 0  Easily annoyed or irritable 0 0 0 0  Afraid - awful might happen 0 0 0 0  Total GAD 7 Score 1 2 0 0  Anxiety Difficulty Somewhat difficult Somewhat difficult Not difficult at all Not difficult at all      Assessment/ Plan: 71 y.o. female   Generalized anxiety disorder with panic attacks - Plan: LORazepam (ATIVAN) 1 MG tablet, busPIRone (BUSPAR) 15 MG tablet, buPROPion (WELLBUTRIN XL) 300 MG 24 hr tablet, mirtazapine (REMERON) 7.5 MG tablet  Poor appetite - Plan: mirtazapine (REMERON) 7.5 MG tablet  Current use of estrogen therapy - Plan: medroxyPROGESTERone (PROVERA) 2.5 MG tablet  Essential hypertension - Plan: lisinopril-hydrochlorothiazide (ZESTORETIC) 20-12.5 MG tablet  Lung exam was totally unremarkable today.  I agree that her symptoms sound like manifestations of uncontrolled panic and anxiety and for this reason I am adding mirtazapine.  She may start backing down on the buspirone to twice daily and ultimately to once daily and off if she  finds the mirtazapine to be very helpful.  Could also consider backing down on the Wellbutrin as this is known to exacerbate anxiety symptoms.  I am going to plan to see her again in 6 weeks, sooner if concerns arise.  We discussed potential side effects of mirtazapine including sleepiness, increased appetite.  Provera has been renewed given her use of vaginal estrogens  Blood pressure is borderline today but we will make no changes given anxiety exacerbation.   Zestoretic renewed  No orders of the defined types were placed in this encounter.  No orders of the defined types were placed in this encounter.    [name], DO [place] [phone]

## 2022-06-16 NOTE — Patient Instructions (Signed)
Ok to decrease Buspar to twice daily Start Mirtazapine at bedtime (works well to take roughly 2 hours after supper) Ok to continue Ativan, but consider moving that night time dose to earlier in the evening for the next couple of weeks. If panic getting better but shortness of breath sensation persists, let me know and I'll order CT lung scan. Though your lung exam was NORMAL today.

## 2022-06-22 NOTE — Progress Notes (Signed)
Electrophysiology Office Note Date: 06/28/2022  ID:  Carmen Green, DOB Sep 16, 1950, MRN [id number]  PCP: [name], DO Primary Cardiologist: None Electrophysiologist: Dr. Rayann Heman ->  [name], MD   CC: Pacemaker follow-up  Carmen Green is a 72 y.o. female seen today for [name], MD for routine electrophysiology followup. Since last being seen in our clinic the patient reports more SOB over the past several weeks. She first noticed after starting on ABx for recurrent UTIs.  Mostly has issues with stairs and more moderate exertion. But occasional is SOB even just climbing into their very tall bed.  she denies chest pain, palpitations, PND, orthopnea, nausea, vomiting, dizziness, syncope, edema, weight gain, or early satiety.   Device History: St. Jude Dual Chamber PPM implanted [phone] for CHB, gen change 2004, gen change 2012, Abandoned R side device and L sided device implant 01/2020   Past Medical History:  Diagnosis Date   Allergy    Anemia    Anxiety    Arthritis    Cataract    bil cataracts removed   Colon polyp    Complete heart block ([place])    Contusion of chest wall 10/10/2007   Centricity Description: CHEST WALL CONTUSION Qualifier: Diagnosis of  By: Aline Brochure MD, Dorothyann Peng   Centricity Description: CONTUSION, RIGHT RIB Qualifier: Diagnosis of  By: Aline Brochure MD, Stanley     Depression    Diverticulitis    Esophagitis    ESOPHAGITIS 06/25/2008   Qualifier: History of  By: Harlon Ditty CMA (AAMA), Dottie     GERD (gastroesophageal reflux disease)    H/O seasonal allergies    Heart murmur    Hiatal hernia    Hyperlipidemia    s/p PPM implant by Dr Olevia Perches 1992 with generator change 2004 ([facility]), she is device dependant   Hypertension    Mitral valve prolapse    Pacemaker    Past Surgical History:  Procedure Laterality Date   APPENDECTOMY     BREAST EXCISIONAL BIOPSY Right 07/08/1998   BREAST SURGERY Right    biopsy   COLONOSCOPY   10/19/2004   [place]   EYE SURGERY Left    cataract extraction with IOL   LAPAROSCOPIC LOW ANTERIOR RESECTION  11/16/2013   Robotic   LAPAROSCOPIC LYSIS OF ADHESIONS N/A 11/15/2013   Procedure: LAPAROSCOPIC LYSIS OF ADHESIONS;  Surgeon: Adin Hector, MD;  Location: [facility];  Service: General;  Laterality: N/A;   left ovary cyst removal  [place] N/A 01/22/2020   Procedure: PACEMAKER IMPLANT;  Surgeon: Thompson Grayer, MD;  Location: [facility];  Service: Cardiovascular;  Laterality: N/A;   [place], 2004, 2012   device dependant   POLYPECTOMY     PROCTOSCOPY  11/15/2013   Procedure: RIGID PROCTOSCOPY;  Surgeon: Adin Hector, MD;  Location: [facility];  Service: General;;   ROBOTIC ASSISTED SALPINGO OOPHERECTOMY Left 11/16/2013   Robotic en bloc w LAR resection   TUBAL LIGATION     UPPER GASTROINTESTINAL ENDOSCOPY      Current Outpatient Medications  Medication Sig Dispense Refill   Ascorbic Acid (VITAMIN C) 1000 MG tablet Take 1,000 mg by mouth daily.     buPROPion (WELLBUTRIN XL) 300 MG 24 hr tablet Take 1 tablet (300 mg total) by mouth daily. 90 tablet 1   busPIRone (BUSPAR) 15 MG tablet TAKE 1 TABLET 3 TIMES A DAY 270 tablet 1   cholecalciferol (VITAMIN D) 25 MCG (  1000 UNIT) tablet Take 1,000 Units by mouth daily.      D-MANNOSE PO Take 1 tablet by mouth daily.     esomeprazole (NEXIUM) 40 MG capsule TAKE ONE CAPSULE DAILY AT NOON 90 capsule 3   estradiol (ESTRACE) 0.1 MG/GM vaginal cream INSERT 1 GRAM VAGINALLY AT BEDTIME FOR 2 WEEKS, THEN TWICE A WEEK THEREAFTER     fluticasone (FLONASE) 50 MCG/ACT nasal spray 1 SPRAY IN EACH NOSTRIL DAILY AS NEEDED FOR ALLERGIES 16 g 3   guaiFENesin (MUCINEX) 600 MG 12 hr tablet Take 600 mg by mouth 2 (two) times daily as needed for cough.     levocetirizine (XYZAL) 5 MG tablet Take 1 tablet (5 mg total) by mouth every evening. 90 tablet 3   lisinopril-hydrochlorothiazide (ZESTORETIC) 20-12.5 MG tablet TAKE 1 TABLET  EVERY MORNING 90 tablet 1   LORazepam (ATIVAN) 1 MG tablet Take 1 tablet every night at bedtime.  May take 1/2 tablet during daytime if needed. 135 tablet 1   meclizine (ANTIVERT) 25 MG tablet Take 1 tablet (25 mg total) by mouth 3 (three) times daily as needed for dizziness. 30 tablet 0   medroxyPROGESTERone (PROVERA) 2.5 MG tablet Take 1 tablet (2.5 mg total) by mouth daily. 90 tablet 3   methenamine (HIPREX) 1 g tablet Take 1 g by mouth 2 (two) times daily.     mirtazapine (REMERON) 7.5 MG tablet Take 1 tablet (7.5 mg total) by mouth at bedtime. For panic/ sleep/ appetite 90 tablet 0   polyethylene glycol powder (GLYCOLAX/MIRALAX) powder Take 1 Container by mouth daily as needed for mild constipation.     rosuvastatin (CRESTOR) 10 MG tablet Take 1 tablet (10 mg total) by mouth daily. 90 tablet 1   trolamine salicylate (ASPERCREME) 10 % cream Apply 1 application  topically as needed for muscle pain.     Zinc Sulfate (ZINC 15 PO) Take by mouth.     No current facility-administered medications for this visit.    Allergies:   Zoloft [sertraline hcl], Amoxapine and related, Cephalexin, Oxycodone, and Penicillins   Social History: Social History   Socioeconomic History   Marital status: Married    Spouse name: Gwyndolyn Saxon   Number of children: 1   Years of education: Not on file   Highest education level: Not on file  Occupational History   Occupation: REGISTRATION   Occupation: retired  Tobacco Use   Smoking status: Never   Smokeless tobacco: Never  Vaping Use   Vaping Use: Never used  Substance and Sexual Activity   Alcohol use: No   Drug use: No   Sexual activity: Not on file  Other Topics Concern   Not on file  Social History Narrative   Lives with spouse in [place] Strain: [place]  (01/08/2022)   Overall Financial Resource Strain (CARDIA)    Difficulty of Paying Living Expenses: Not hard at all  Food Insecurity: No Food  Insecurity (01/08/2022)   Hunger Vital Sign    Worried About Running Out of Food in the Last Year: Never true    [place] in the Last Year: Never true  Transportation Needs: No Transportation Needs (01/08/2022)   PRAPARE - Hydrologist (Medical): No    Lack of Transportation (Non-Medical): No  Physical Activity: Insufficiently Active (01/08/2022)   Exercise Vital Sign    Days of Exercise per Week: 1 day    Minutes  of Exercise per Session: 30 min  Stress: No Stress Concern Present (01/08/2022)   [place]    Feeling of Stress : Not at all  Social Connections: [place] (01/08/2022)   Social Connection and Isolation Panel [NHANES]    Frequency of Communication with Friends and Family: More than three times a week    Frequency of Social Gatherings with Friends and Family: More than three times a week    Attends Religious Services: More than 4 times per year    Active Member of [employer] or Organizations: Yes    Attends Music therapist: More than 4 times per year    Marital Status: Married  Human resources officer Violence: Not At Risk (01/08/2022)   Humiliation, Afraid, Rape, and Kick questionnaire    Fear of Current or Ex-Partner: No    Emotionally Abused: No    Physically Abused: No    Sexually Abused: No    Family History: Family History  Problem Relation Age of Onset   Diabetes Mother    Stroke Mother    Diabetes Father    Lung cancer Father    Heart disease Maternal Aunt    Breast cancer Maternal Aunt    Heart disease Maternal Grandmother    Lung cancer Maternal Grandfather    Colon cancer Neg Hx    Esophageal cancer Neg Hx    Pancreatic cancer Neg Hx    Rectal cancer Neg Hx    Stomach cancer Neg Hx      Review of Systems: All other systems reviewed and are otherwise negative except as noted above.  Physical Exam: Vitals:   06/28/22 1001  BP: 132/80   Pulse: (!) 102  SpO2: 98%  Weight: 140 lb 3.2 oz (63.6 kg)  Height: '5\' 1"'$  (1.549 m)     GEN- The patient is well appearing, alert and oriented x 3 today.   HEENT: normocephalic, atraumatic; sclera clear, conjunctiva pink; hearing intact; oropharynx clear; neck supple, no JVP Lungs- Clear to ausculation bilaterally, normal work of breathing.  No wheezes, rales, rhonchi Heart- Regular  rate and rhythm, at least 3/6 systolic murmur, PMI not laterally displaced GI- soft, non-tender, non-distended, bowel sounds present, no hepatosplenomegaly Extremities- no clubbing or cyanosis. No peripheral edema; DP/PT/radial pulses 2+ bilaterally MS- no significant deformity or atrophy Skin- warm and dry, no rash or lesion; PPM pocket well healed Psych- euthymic mood, full affect Neuro- strength and sensation are intact  PPM Interrogation-  reviewed in detail today,  See PACEART report.  EKG:  EKG is ordered today. Personal review of ekg ordered today shows NSR vs  borderline ST at 99 bpm   Recent Labs: 07/06/2021: BUN 10; Creatinine, Ser 0.93; Hemoglobin 13.5; Platelets 261; Potassium 4.7; Sodium 135; TSH 0.849 09/30/2021: ALT 18   Wt Readings from Last 3 Encounters:  06/28/22 140 lb 3.2 oz (63.6 kg)  06/16/22 139 lb 6.4 oz (63.2 kg)  05/27/22 139 lb 4 oz (63.2 kg)     Other studies Reviewed: Additional studies/ records that were reviewed today include: Previous EP office notes, Previous remote checks, Most recent labwork.   Assessment and Plan:  1. CHB s/p St. Jude PPM (R side abandoned, L sided implant 01/2020) Normal PPM function See Pace Art report No changes today With elevated HRs and occasional AMS episodes (AT), will add toprol 25 mg daily.   2. SOB/Fatigue Suspect multifactorial.  Her murmur does sound slightly louder than previously described.  Update Echo. If MR has significantly increased, will need to consider TEE.  Her HRs are elevated 90-100s today, but Histograms have a  very long follow up period of >1 year. Labs today including TSH and BNP  Current medicines are reviewed at length with the patient today.    Labs/ tests ordered today include:  Orders Placed This Encounter  Procedures   Basic metabolic panel   Pro b natriuretic peptide (BNP)   CBC   TSH   CUP PACEART INCLINIC DEVICE CHECK   EKG 12-Lead   ECHOCARDIOGRAM COMPLETE   Disposition:   Follow up with EP APP in 3 months, sooner pending echo results.    Jacalyn Lefevre, PA-C  06/28/2022 10:07 AM  [facility] [address] [place] [address] [phone] (office) [phone] (fax)

## 2022-06-28 ENCOUNTER — Encounter: Payer: Self-pay | Admitting: Student

## 2022-06-28 ENCOUNTER — Ambulatory Visit: Payer: Medicare Other | Attending: Student | Admitting: Student

## 2022-06-28 VITALS — BP 132/80 | HR 102 | Ht 61.0 in | Wt 140.2 lb

## 2022-06-28 DIAGNOSIS — R5383 Other fatigue: Secondary | ICD-10-CM | POA: Diagnosis not present

## 2022-06-28 DIAGNOSIS — R0602 Shortness of breath: Secondary | ICD-10-CM

## 2022-06-28 DIAGNOSIS — I442 Atrioventricular block, complete: Secondary | ICD-10-CM | POA: Diagnosis not present

## 2022-06-28 LAB — CUP PACEART INCLINIC DEVICE CHECK
Battery Remaining Longevity: 92 mo
Battery Voltage: 3.01 V
Brady Statistic RA Percent Paced: 6.8 %
Brady Statistic RV Percent Paced: 96 %
Date Time Interrogation Session: 20240108103254
Implantable Lead Connection Status: 753985
Implantable Lead Connection Status: 753985
Implantable Lead Implant Date: 20210803
Implantable Lead Implant Date: 20210803
Implantable Lead Location: 753859
Implantable Lead Location: 753860
Implantable Pulse Generator Implant Date: 20210803
Lead Channel Impedance Value: 400 Ohm
Lead Channel Impedance Value: 450 Ohm
Lead Channel Pacing Threshold Amplitude: 0.5 V
Lead Channel Pacing Threshold Amplitude: 0.5 V
Lead Channel Pacing Threshold Amplitude: 0.75 V
Lead Channel Pacing Threshold Pulse Width: 0.4 ms
Lead Channel Pacing Threshold Pulse Width: 0.4 ms
Lead Channel Pacing Threshold Pulse Width: 0.5 ms
Lead Channel Sensing Intrinsic Amplitude: 12 mV
Lead Channel Sensing Intrinsic Amplitude: 2.6 mV
Lead Channel Setting Pacing Amplitude: 1 V
Lead Channel Setting Pacing Amplitude: 2 V
Lead Channel Setting Pacing Pulse Width: 0.5 ms
Lead Channel Setting Sensing Sensitivity: 4 mV
Pulse Gen Model: 2272
Pulse Gen Serial Number: 3852139

## 2022-06-28 LAB — CBC

## 2022-06-28 MED ORDER — METOPROLOL SUCCINATE ER 25 MG PO TB24: 25.0000 mg | ORAL_TABLET | Freq: Every day | ORAL | 6 refills | Status: DC

## 2022-06-28 NOTE — Patient Instructions (Signed)
Medication Instructions:  Your physician recommends that you continue on your current medications as directed. Please refer to the Current Medication list given to you today.  *If you need a refill on your cardiac medications before your next appointment, please call your pharmacy*   Lab Work: TODAY: BMET, CBC, TSH, BNP  If you have labs (blood work) drawn today and your tests are completely normal, you will receive your results only by: [place] (if you have MyChart) OR A paper copy in the mail If you have any lab test that is abnormal or we need to change your treatment, we will call you to review the results.   Testing/Procedures: Your physician has requested that you have an echocardiogram. Echocardiography is a painless test that uses sound waves to create images of your heart. It provides your doctor with information about the size and shape of your heart and how well your heart's chambers and valves are working. This procedure takes approximately one hour. There are no restrictions for this procedure. Please do NOT wear cologne, perfume, aftershave, or lotions (deodorant is allowed). Please arrive 15 minutes prior to your appointment time.  Follow-Up: At [facility], you and your health needs are our priority.  As part of our continuing mission to provide you with exceptional heart care, we have created designated Provider Care Teams.  These Care Teams include your primary Cardiologist (physician) and Advanced Practice Providers (APPs -  Physician Assistants and Nurse Practitioners) who all work together to provide you with the care you need, when you need it.  We recommend signing up for the patient portal called "MyChart".  Sign up information is provided on this After Visit Summary.  MyChart is used to connect with patients for Virtual Visits (Telemedicine).  Patients are able to view lab/test results, encounter notes, upcoming appointments, etc.  Non-urgent messages  can be sent to your provider as well.   To learn more about what you can do with MyChart, go to [email or web address].    Your next appointment:   3 month(s)  The format for your next appointment:   In Person  Provider:   Legrand Como "Jonni Sanger" Chalmers Cater, PA-C     Important Information About Sugar

## 2022-06-29 LAB — TSH: TSH: 1.53 u[IU]/mL (ref 0.450–4.500)

## 2022-06-29 LAB — BASIC METABOLIC PANEL
BUN/Creatinine Ratio: 9 — ABNORMAL LOW (ref 12–28)
BUN: 9 mg/dL (ref 8–27)
CO2: 21 mmol/L (ref 20–29)
Calcium: 9.7 mg/dL (ref 8.7–10.3)
Chloride: 101 mmol/L (ref 96–106)
Creatinine, Ser: 0.98 mg/dL (ref 0.57–1.00)
Glucose: 96 mg/dL (ref 70–99)
Potassium: 4.1 mmol/L (ref 3.5–5.2)
Sodium: 138 mmol/L (ref 134–144)
eGFR: 61 mL/min/{1.73_m2} (ref >59)

## 2022-06-29 LAB — CBC
Hematocrit: 40.8 % (ref 34.0–46.6)
Hemoglobin: 13.8 g/dL (ref 11.1–15.9)
MCH: 32.9 pg (ref 26.6–33.0)
MCHC: 33.8 g/dL (ref 31.5–35.7)
MCV: 97 fL (ref 79–97)
Platelets: 239 10*3/uL (ref 150–450)
RBC: 4.19 x10E6/uL (ref 3.77–5.28)
RDW: 11.8 % (ref 11.7–15.4)
WBC: 4.7 10*3/uL (ref 3.4–10.8)

## 2022-06-29 LAB — PRO B NATRIURETIC PEPTIDE: NT-Pro BNP: 3230 pg/mL — ABNORMAL HIGH (ref 0–301)

## 2022-06-30 ENCOUNTER — Other Ambulatory Visit: Payer: Self-pay

## 2022-06-30 MED ORDER — FUROSEMIDE 20 MG PO TABS: ORAL_TABLET | ORAL | 3 refills | Status: DC

## 2022-07-03 ENCOUNTER — Other Ambulatory Visit: Payer: Self-pay | Admitting: Family Medicine

## 2022-07-03 DIAGNOSIS — F410 Panic disorder [episodic paroxysmal anxiety] without agoraphobia: Secondary | ICD-10-CM

## 2022-07-06 ENCOUNTER — Ambulatory Visit (HOSPITAL_COMMUNITY): Payer: Medicare Other | Attending: Cardiology

## 2022-07-06 DIAGNOSIS — R5383 Other fatigue: Secondary | ICD-10-CM | POA: Diagnosis not present

## 2022-07-06 DIAGNOSIS — I442 Atrioventricular block, complete: Secondary | ICD-10-CM | POA: Insufficient documentation

## 2022-07-06 LAB — ECHOCARDIOGRAM COMPLETE
Area-P 1/2: 3.21 cm2
MV M vel: 5.35 m/s
MV Peak grad: 114.5 mmHg
S' Lateral: 4.8 cm

## 2022-07-06 MED ORDER — PERFLUTREN LIPID MICROSPHERE
1.0000 mL | INTRAVENOUS | Status: AC | PRN
  Administered 2022-07-06: 2 mL via INTRAVENOUS

## 2022-07-12 ENCOUNTER — Other Ambulatory Visit: Payer: Self-pay

## 2022-07-12 ENCOUNTER — Ambulatory Visit (INDEPENDENT_AMBULATORY_CARE_PROVIDER_SITE_OTHER): Payer: Medicare Other | Admitting: Student

## 2022-07-12 ENCOUNTER — Emergency Department (HOSPITAL_COMMUNITY): Payer: Medicare Other

## 2022-07-12 ENCOUNTER — Inpatient Hospital Stay (HOSPITAL_COMMUNITY)
Admission: EM | Admit: 2022-07-12 | Discharge: 2022-07-23 | DRG: 308 | Disposition: A | Discharge status: Hospice | Payer: Medicare Other | Attending: Internal Medicine | Admitting: Internal Medicine

## 2022-07-12 ENCOUNTER — Emergency Department (HOSPITAL_COMMUNITY)
Admission: EM | Admit: 2022-07-12 | Discharge: 2022-07-12 | Disposition: A | Discharge status: Home / Self Care | Payer: Medicare Other | Source: Home / Self Care | Attending: Emergency Medicine | Admitting: Emergency Medicine

## 2022-07-12 ENCOUNTER — Encounter: Payer: Self-pay | Admitting: Student

## 2022-07-12 ENCOUNTER — Encounter (HOSPITAL_COMMUNITY): Payer: Self-pay | Admitting: Emergency Medicine

## 2022-07-12 VITALS — BP 120/66 | HR 60 | Ht 61.0 in | Wt 137.0 lb

## 2022-07-12 DIAGNOSIS — D696 Thrombocytopenia, unspecified: Secondary | ICD-10-CM | POA: Diagnosis present

## 2022-07-12 DIAGNOSIS — G931 Anoxic brain damage, not elsewhere classified: Secondary | ICD-10-CM | POA: Diagnosis present

## 2022-07-12 DIAGNOSIS — E872 Acidosis, unspecified: Secondary | ICD-10-CM | POA: Diagnosis present

## 2022-07-12 DIAGNOSIS — I5023 Acute on chronic systolic (congestive) heart failure: Secondary | ICD-10-CM

## 2022-07-12 DIAGNOSIS — I4892 Unspecified atrial flutter: Secondary | ICD-10-CM | POA: Diagnosis present

## 2022-07-12 DIAGNOSIS — I442 Atrioventricular block, complete: Secondary | ICD-10-CM | POA: Diagnosis present

## 2022-07-12 DIAGNOSIS — Z66 Do not resuscitate: Secondary | ICD-10-CM | POA: Diagnosis present

## 2022-07-12 DIAGNOSIS — Z79818 Long term (current) use of other agents affecting estrogen receptors and estrogen levels: Secondary | ICD-10-CM

## 2022-07-12 DIAGNOSIS — Z1152 Encounter for screening for COVID-19: Secondary | ICD-10-CM | POA: Insufficient documentation

## 2022-07-12 DIAGNOSIS — F419 Anxiety disorder, unspecified: Secondary | ICD-10-CM | POA: Diagnosis present

## 2022-07-12 DIAGNOSIS — Z950 Presence of cardiac pacemaker: Secondary | ICD-10-CM | POA: Insufficient documentation

## 2022-07-12 DIAGNOSIS — Z833 Family history of diabetes mellitus: Secondary | ICD-10-CM

## 2022-07-12 DIAGNOSIS — I472 Ventricular tachycardia, unspecified: Secondary | ICD-10-CM | POA: Diagnosis present

## 2022-07-12 DIAGNOSIS — Z803 Family history of malignant neoplasm of breast: Secondary | ICD-10-CM

## 2022-07-12 DIAGNOSIS — J1569 Pneumonia due to other gram-negative bacteria: Secondary | ICD-10-CM | POA: Diagnosis not present

## 2022-07-12 DIAGNOSIS — N179 Acute kidney failure, unspecified: Secondary | ICD-10-CM

## 2022-07-12 DIAGNOSIS — F32A Depression, unspecified: Secondary | ICD-10-CM | POA: Diagnosis present

## 2022-07-12 DIAGNOSIS — R079 Chest pain, unspecified: Secondary | ICD-10-CM | POA: Diagnosis not present

## 2022-07-12 DIAGNOSIS — I081 Rheumatic disorders of both mitral and tricuspid valves: Secondary | ICD-10-CM | POA: Diagnosis present

## 2022-07-12 DIAGNOSIS — M96A3 Multiple fractures of ribs associated with chest compression and cardiopulmonary resuscitation: Secondary | ICD-10-CM | POA: Diagnosis present

## 2022-07-12 DIAGNOSIS — Z823 Family history of stroke: Secondary | ICD-10-CM

## 2022-07-12 DIAGNOSIS — I469 Cardiac arrest, cause unspecified: Secondary | ICD-10-CM

## 2022-07-12 DIAGNOSIS — Z515 Encounter for palliative care: Secondary | ICD-10-CM

## 2022-07-12 DIAGNOSIS — R55 Syncope and collapse: Secondary | ICD-10-CM | POA: Diagnosis not present

## 2022-07-12 DIAGNOSIS — I5020 Unspecified systolic (congestive) heart failure: Secondary | ICD-10-CM | POA: Insufficient documentation

## 2022-07-12 DIAGNOSIS — Z9911 Dependence on respirator [ventilator] status: Secondary | ICD-10-CM | POA: Diagnosis not present

## 2022-07-12 DIAGNOSIS — W19XXXA Unspecified fall, initial encounter: Secondary | ICD-10-CM | POA: Diagnosis not present

## 2022-07-12 DIAGNOSIS — Z880 Allergy status to penicillin: Secondary | ICD-10-CM

## 2022-07-12 DIAGNOSIS — E785 Hyperlipidemia, unspecified: Secondary | ICD-10-CM | POA: Diagnosis present

## 2022-07-12 DIAGNOSIS — R4182 Altered mental status, unspecified: Secondary | ICD-10-CM | POA: Diagnosis not present

## 2022-07-12 DIAGNOSIS — J9601 Acute respiratory failure with hypoxia: Secondary | ICD-10-CM | POA: Diagnosis not present

## 2022-07-12 DIAGNOSIS — J90 Pleural effusion, not elsewhere classified: Secondary | ICD-10-CM | POA: Diagnosis not present

## 2022-07-12 DIAGNOSIS — G9340 Encephalopathy, unspecified: Secondary | ICD-10-CM

## 2022-07-12 DIAGNOSIS — A4153 Sepsis due to Serratia: Secondary | ICD-10-CM | POA: Diagnosis not present

## 2022-07-12 DIAGNOSIS — I4901 Ventricular fibrillation: Secondary | ICD-10-CM

## 2022-07-12 DIAGNOSIS — Z801 Family history of malignant neoplasm of trachea, bronchus and lung: Secondary | ICD-10-CM

## 2022-07-12 DIAGNOSIS — I110 Hypertensive heart disease with heart failure: Secondary | ICD-10-CM | POA: Diagnosis present

## 2022-07-12 DIAGNOSIS — E871 Hypo-osmolality and hyponatremia: Secondary | ICD-10-CM | POA: Diagnosis present

## 2022-07-12 DIAGNOSIS — J811 Chronic pulmonary edema: Secondary | ICD-10-CM | POA: Diagnosis not present

## 2022-07-12 DIAGNOSIS — Z79899 Other long term (current) drug therapy: Secondary | ICD-10-CM

## 2022-07-12 DIAGNOSIS — T361X5A Adverse effect of cephalosporins and other beta-lactam antibiotics, initial encounter: Secondary | ICD-10-CM | POA: Diagnosis present

## 2022-07-12 DIAGNOSIS — D649 Anemia, unspecified: Secondary | ICD-10-CM | POA: Diagnosis present

## 2022-07-12 DIAGNOSIS — Z8249 Family history of ischemic heart disease and other diseases of the circulatory system: Secondary | ICD-10-CM

## 2022-07-12 DIAGNOSIS — E876 Hypokalemia: Secondary | ICD-10-CM | POA: Diagnosis not present

## 2022-07-12 DIAGNOSIS — I340 Nonrheumatic mitral (valve) insufficiency: Secondary | ICD-10-CM

## 2022-07-12 DIAGNOSIS — J9811 Atelectasis: Secondary | ICD-10-CM | POA: Diagnosis not present

## 2022-07-12 DIAGNOSIS — I462 Cardiac arrest due to underlying cardiac condition: Secondary | ICD-10-CM | POA: Diagnosis present

## 2022-07-12 DIAGNOSIS — R570 Cardiogenic shock: Secondary | ICD-10-CM | POA: Diagnosis not present

## 2022-07-12 DIAGNOSIS — I4891 Unspecified atrial fibrillation: Secondary | ICD-10-CM | POA: Diagnosis present

## 2022-07-12 DIAGNOSIS — R0600 Dyspnea, unspecified: Secondary | ICD-10-CM | POA: Diagnosis not present

## 2022-07-12 DIAGNOSIS — S2241XA Multiple fractures of ribs, right side, initial encounter for closed fracture: Secondary | ICD-10-CM | POA: Diagnosis not present

## 2022-07-12 DIAGNOSIS — Z83438 Family history of other disorder of lipoprotein metabolism and other lipidemia: Secondary | ICD-10-CM

## 2022-07-12 DIAGNOSIS — R410 Disorientation, unspecified: Secondary | ICD-10-CM | POA: Diagnosis not present

## 2022-07-12 DIAGNOSIS — E8770 Fluid overload, unspecified: Secondary | ICD-10-CM | POA: Diagnosis not present

## 2022-07-12 DIAGNOSIS — E8779 Other fluid overload: Secondary | ICD-10-CM | POA: Insufficient documentation

## 2022-07-12 DIAGNOSIS — K7200 Acute and subacute hepatic failure without coma: Secondary | ICD-10-CM | POA: Diagnosis not present

## 2022-07-12 DIAGNOSIS — I5043 Acute on chronic combined systolic (congestive) and diastolic (congestive) heart failure: Secondary | ICD-10-CM | POA: Diagnosis not present

## 2022-07-12 DIAGNOSIS — R0602 Shortness of breath: Secondary | ICD-10-CM | POA: Diagnosis not present

## 2022-07-12 DIAGNOSIS — J690 Pneumonitis due to inhalation of food and vomit: Secondary | ICD-10-CM | POA: Diagnosis not present

## 2022-07-12 DIAGNOSIS — Z7189 Other specified counseling: Secondary | ICD-10-CM | POA: Diagnosis not present

## 2022-07-12 DIAGNOSIS — K219 Gastro-esophageal reflux disease without esophagitis: Secondary | ICD-10-CM | POA: Diagnosis present

## 2022-07-12 DIAGNOSIS — Z888 Allergy status to other drugs, medicaments and biological substances status: Secondary | ICD-10-CM

## 2022-07-12 DIAGNOSIS — Z885 Allergy status to narcotic agent status: Secondary | ICD-10-CM

## 2022-07-12 DIAGNOSIS — Z4682 Encounter for fitting and adjustment of non-vascular catheter: Secondary | ICD-10-CM | POA: Diagnosis not present

## 2022-07-12 DIAGNOSIS — I493 Ventricular premature depolarization: Secondary | ICD-10-CM | POA: Diagnosis not present

## 2022-07-12 DIAGNOSIS — Z881 Allergy status to other antibiotic agents status: Secondary | ICD-10-CM

## 2022-07-12 DIAGNOSIS — R54 Age-related physical debility: Secondary | ICD-10-CM | POA: Diagnosis present

## 2022-07-12 DIAGNOSIS — R739 Hyperglycemia, unspecified: Secondary | ICD-10-CM | POA: Diagnosis present

## 2022-07-12 DIAGNOSIS — I6529 Occlusion and stenosis of unspecified carotid artery: Secondary | ICD-10-CM | POA: Diagnosis not present

## 2022-07-12 LAB — CBC WITH DIFFERENTIAL/PLATELET
Abs Immature Granulocytes: 0.02 10*3/uL (ref 0.00–0.07)
Abs Immature Granulocytes: 0.56 10*3/uL — ABNORMAL HIGH (ref 0.00–0.07)
Basophils Absolute: 0.1 10*3/uL (ref 0.0–0.1)
Basophils Absolute: 0.1 10*3/uL (ref 0.0–0.1)
Basophils Relative: 1 %
Basophils Relative: 1 %
Eosinophils Absolute: 0.1 10*3/uL (ref 0.0–0.5)
Eosinophils Absolute: 0.1 10*3/uL (ref 0.0–0.5)
Eosinophils Relative: 1 %
Eosinophils Relative: 1 %
HCT: 36.9 % (ref 36.0–46.0)
HCT: 40.4 % (ref 36.0–46.0)
Hemoglobin: 12.9 g/dL (ref 12.0–15.0)
Hemoglobin: 13.5 g/dL (ref 12.0–15.0)
Immature Granulocytes: 0 %
Immature Granulocytes: 5 %
Lymphocytes Relative: 11 %
Lymphocytes Relative: 23 %
Lymphs Abs: 0.9 10*3/uL (ref 0.7–4.0)
Lymphs Abs: 2.8 10*3/uL (ref 0.7–4.0)
MCH: 33.1 pg (ref 26.0–34.0)
MCH: 33.2 pg (ref 26.0–34.0)
MCHC: 35 g/dL (ref 30.0–36.0)
MCHC: 33.4 g/dL (ref 30.0–36.0)
MCV: 94.6 fL (ref 80.0–100.0)
MCV: 99.3 fL (ref 80.0–100.0)
Monocytes Absolute: 0.5 10*3/uL (ref 0.1–1.0)
Monocytes Absolute: 0.4 10*3/uL (ref 0.1–1.0)
Monocytes Relative: 6 %
Monocytes Relative: 3 %
Neutro Abs: 6.4 10*3/uL (ref 1.7–7.7)
Neutro Abs: 8.5 10*3/uL — ABNORMAL HIGH (ref 1.7–7.7)
Neutrophils Relative %: 81 %
Neutrophils Relative %: 67 %
Platelets: 201 10*3/uL (ref 150–400)
Platelets: 177 10*3/uL (ref 150–400)
RBC: 3.9 MIL/uL (ref 3.87–5.11)
RBC: 4.07 MIL/uL (ref 3.87–5.11)
RDW: 11.6 % (ref 11.5–15.5)
RDW: 11.6 % (ref 11.5–15.5)
WBC: 7.9 10*3/uL (ref 4.0–10.5)
WBC: 12.3 10*3/uL — ABNORMAL HIGH (ref 4.0–10.5)
nRBC: 0 % (ref 0.0–0.2)
nRBC: 0 % (ref 0.0–0.2)

## 2022-07-12 LAB — COMPREHENSIVE METABOLIC PANEL
ALT: 16 U/L (ref 0–44)
ALT: 627 U/L — ABNORMAL HIGH (ref 0–44)
AST: 26 U/L (ref 15–41)
AST: 717 U/L — ABNORMAL HIGH (ref 15–41)
Albumin: 3.9 g/dL (ref 3.5–5.0)
Albumin: 3.7 g/dL (ref 3.5–5.0)
Alkaline Phosphatase: 53 U/L (ref 38–126)
Alkaline Phosphatase: 77 U/L (ref 38–126)
Anion gap: 9 (ref 5–15)
Anion gap: 13 (ref 5–15)
BUN: 11 mg/dL (ref 8–23)
BUN: 16 mg/dL (ref 8–23)
CO2: 22 mmol/L (ref 22–32)
CO2: 17 mmol/L — ABNORMAL LOW (ref 22–32)
Calcium: 9 mg/dL (ref 8.9–10.3)
Calcium: 8 mg/dL — ABNORMAL LOW (ref 8.9–10.3)
Chloride: 96 mmol/L — ABNORMAL LOW (ref 98–111)
Chloride: 100 mmol/L (ref 98–111)
Creatinine, Ser: 0.95 mg/dL (ref 0.44–1.00)
Creatinine, Ser: 1.44 mg/dL — ABNORMAL HIGH (ref 0.44–1.00)
GFR, Estimated: >60 mL/min (ref >60)
GFR, Estimated: 38 mL/min — ABNORMAL LOW (ref >60)
Glucose, Bld: 173 mg/dL — ABNORMAL HIGH (ref 70–99)
Glucose, Bld: 198 mg/dL — ABNORMAL HIGH (ref 70–99)
Potassium: 3.7 mmol/L (ref 3.5–5.1)
Potassium: 3.2 mmol/L — ABNORMAL LOW (ref 3.5–5.1)
Sodium: 127 mmol/L — ABNORMAL LOW (ref 135–145)
Sodium: 130 mmol/L — ABNORMAL LOW (ref 135–145)
Total Bilirubin: 0.6 mg/dL (ref 0.3–1.2)
Total Bilirubin: 0.9 mg/dL (ref 0.3–1.2)
Total Protein: 6.7 g/dL (ref 6.5–8.1)
Total Protein: 6.4 g/dL — ABNORMAL LOW (ref 6.5–8.1)

## 2022-07-12 LAB — I-STAT ARTERIAL BLOOD GAS, ED
Acid-base deficit: 8 mmol/L — ABNORMAL HIGH (ref 0.0–2.0)
Acid-base deficit: 8 mmol/L — ABNORMAL HIGH (ref 0.0–2.0)
Bicarbonate: 18.3 mmol/L — ABNORMAL LOW (ref 20.0–28.0)
Bicarbonate: 20.7 mmol/L (ref 20.0–28.0)
Calcium, Ion: 1.14 mmol/L — ABNORMAL LOW (ref 1.15–1.40)
Calcium, Ion: 1.18 mmol/L (ref 1.15–1.40)
HCT: 39 % (ref 36.0–46.0)
HCT: 40 % (ref 36.0–46.0)
Hemoglobin: 13.3 g/dL (ref 12.0–15.0)
Hemoglobin: 13.6 g/dL (ref 12.0–15.0)
O2 Saturation: 100 %
O2 Saturation: 97 %
Patient temperature: 35.5
Patient temperature: 98.7
Potassium: 3.7 mmol/L (ref 3.5–5.1)
Potassium: 5.3 mmol/L — ABNORMAL HIGH (ref 3.5–5.1)
Sodium: 129 mmol/L — ABNORMAL LOW (ref 135–145)
Sodium: 135 mmol/L (ref 135–145)
TCO2: 20 mmol/L — ABNORMAL LOW (ref 22–32)
TCO2: 22 mmol/L (ref 22–32)
pCO2 arterial: 40.5 mmHg (ref 32–48)
pCO2 arterial: 52.3 mmHg — ABNORMAL HIGH (ref 32–48)
pH, Arterial: 7.196 — CL (ref 7.35–7.45)
pH, Arterial: 7.264 — ABNORMAL LOW (ref 7.35–7.45)
pO2, Arterial: 100 mmHg (ref 83–108)
pO2, Arterial: 224 mmHg — ABNORMAL HIGH (ref 83–108)

## 2022-07-12 LAB — I-STAT CHEM 8, ED
BUN: 18 mg/dL (ref 8–23)
BUN: 19 mg/dL (ref 8–23)
Calcium, Ion: 1.04 mmol/L — ABNORMAL LOW (ref 1.15–1.40)
Calcium, Ion: 1.05 mmol/L — ABNORMAL LOW (ref 1.15–1.40)
Chloride: 100 mmol/L (ref 98–111)
Chloride: 100 mmol/L (ref 98–111)
Creatinine, Ser: 1.3 mg/dL — ABNORMAL HIGH (ref 0.44–1.00)
Creatinine, Ser: 1.3 mg/dL — ABNORMAL HIGH (ref 0.44–1.00)
Glucose, Bld: 196 mg/dL — ABNORMAL HIGH (ref 70–99)
Glucose, Bld: 197 mg/dL — ABNORMAL HIGH (ref 70–99)
HCT: 40 % (ref 36.0–46.0)
HCT: 40 % (ref 36.0–46.0)
Hemoglobin: 13.6 g/dL (ref 12.0–15.0)
Hemoglobin: 13.6 g/dL (ref 12.0–15.0)
Potassium: 3.2 mmol/L — ABNORMAL LOW (ref 3.5–5.1)
Potassium: 3.3 mmol/L — ABNORMAL LOW (ref 3.5–5.1)
Sodium: 135 mmol/L (ref 135–145)
Sodium: 135 mmol/L (ref 135–145)
TCO2: 19 mmol/L — ABNORMAL LOW (ref 22–32)
TCO2: 20 mmol/L — ABNORMAL LOW (ref 22–32)

## 2022-07-12 LAB — TROPONIN I (HIGH SENSITIVITY)
Troponin I (High Sensitivity): 19 ng/L — ABNORMAL HIGH (ref <18)
Troponin I (High Sensitivity): 23 ng/L — ABNORMAL HIGH (ref <18)
Troponin I (High Sensitivity): 463 ng/L (ref <18)
Troponin I (High Sensitivity): 753 ng/L (ref <18)

## 2022-07-12 LAB — RESP PANEL BY RT-PCR (RSV, FLU A&B, COVID)  RVPGX2
Influenza A by PCR: NEGATIVE
Influenza B by PCR: NEGATIVE
Resp Syncytial Virus by PCR: NEGATIVE
SARS Coronavirus 2 by RT PCR: NEGATIVE

## 2022-07-12 LAB — BRAIN NATRIURETIC PEPTIDE: B Natriuretic Peptide: 547.6 pg/mL — ABNORMAL HIGH (ref 0.0–100.0)

## 2022-07-12 MED ORDER — LOSARTAN POTASSIUM 25 MG PO TABS: 25.0000 mg | ORAL_TABLET | Freq: Every day | ORAL | 3 refills | Status: DC

## 2022-07-12 MED ORDER — ONDANSETRON HCL 4 MG/2ML IJ SOLN: 4.0000 mg | Freq: Four times a day (QID) | INTRAMUSCULAR | Status: DC | PRN

## 2022-07-12 MED ORDER — ACETAMINOPHEN 650 MG RE SUPP: 650.0000 mg | RECTAL | Status: DC | PRN

## 2022-07-12 MED ORDER — FUROSEMIDE 20 MG PO TABS: ORAL_TABLET | ORAL | 3 refills | Status: DC

## 2022-07-12 MED ORDER — PROPOFOL 1000 MG/100ML IV EMUL: 0.0000 ug/kg/min | INTRAVENOUS | Status: DC

## 2022-07-12 MED ORDER — ETOMIDATE 2 MG/ML IV SOLN
INTRAVENOUS | Status: AC | PRN
  Administered 2022-07-12: 20 mg via INTRAVENOUS

## 2022-07-12 MED ORDER — HEPARIN BOLUS VIA INFUSION
2000.0000 [IU] | Freq: Once | INTRAVENOUS | Status: AC
  Administered 2022-07-13: 2000 [IU] via INTRAVENOUS
  Filled 2022-07-12: qty 2000

## 2022-07-12 MED ORDER — PROPOFOL 1000 MG/100ML IV EMUL
0.0000 ug/kg/min | INTRAVENOUS | Status: DC
  Administered 2022-07-13: 25 ug/kg/min via INTRAVENOUS
  Administered 2022-07-13: 30 ug/kg/min via INTRAVENOUS
  Administered 2022-07-14: 25 ug/kg/min via INTRAVENOUS
  Administered 2022-07-14 – 2022-07-15 (×2): 30 ug/kg/min via INTRAVENOUS
  Administered 2022-07-15: 10 ug/kg/min via INTRAVENOUS
  Administered 2022-07-16: 15 ug/kg/min via INTRAVENOUS
  Filled 2022-07-12 (×7): qty 100

## 2022-07-12 MED ORDER — ROCURONIUM BROMIDE 50 MG/5ML IV SOLN
INTRAVENOUS | Status: AC | PRN
  Administered 2022-07-12: 100 mg via INTRAVENOUS

## 2022-07-12 MED ORDER — POLYETHYLENE GLYCOL 3350 17 G PO PACK
17.0000 g | PACK | Freq: Every day | ORAL | Status: DC
  Administered 2022-07-14 – 2022-07-18 (×4): 17 g
  Filled 2022-07-12 (×4): qty 1

## 2022-07-12 MED ORDER — AMIODARONE LOAD VIA INFUSION: 150.0000 mg | Freq: Once | INTRAVENOUS | Status: DC

## 2022-07-12 MED ORDER — FUROSEMIDE 10 MG/ML IJ SOLN
60.0000 mg | Freq: Once | INTRAMUSCULAR | Status: AC
  Administered 2022-07-12: 60 mg via INTRAVENOUS
  Filled 2022-07-12: qty 6

## 2022-07-12 MED ORDER — ACETAMINOPHEN 325 MG PO TABS
650.0000 mg | ORAL_TABLET | ORAL | Status: DC | PRN
  Administered 2022-07-16 – 2022-07-19 (×3): 650 mg via ORAL
  Filled 2022-07-12 (×3): qty 2

## 2022-07-12 MED ORDER — AMIODARONE HCL IN DEXTROSE 360-4.14 MG/200ML-% IV SOLN: 30.0000 mg/h | INTRAVENOUS | Status: DC

## 2022-07-12 MED ORDER — SPIRONOLACTONE 25 MG PO TABS: 25.0000 mg | ORAL_TABLET | Freq: Every day | ORAL | 3 refills | Status: DC

## 2022-07-12 MED ORDER — FENTANYL CITRATE PF 50 MCG/ML IJ SOSY
25.0000 ug | PREFILLED_SYRINGE | INTRAMUSCULAR | Status: DC | PRN
  Administered 2022-07-13: 50 ug via INTRAVENOUS
  Administered 2022-07-14: 25 ug via INTRAVENOUS
  Administered 2022-07-19 (×2): 50 ug via INTRAVENOUS
  Administered 2022-07-20: 100 ug via INTRAVENOUS
  Administered 2022-07-20: 75 ug via INTRAVENOUS
  Administered 2022-07-20: 100 ug via INTRAVENOUS
  Administered 2022-07-20 (×2): 50 ug via INTRAVENOUS
  Administered 2022-07-20: 100 ug via INTRAVENOUS
  Administered 2022-07-21 (×2): 50 ug via INTRAVENOUS
  Administered 2022-07-21 (×4): 100 ug via INTRAVENOUS
  Filled 2022-07-12: qty 1
  Filled 2022-07-12: qty 2
  Filled 2022-07-12 (×2): qty 1
  Filled 2022-07-12: qty 2
  Filled 2022-07-12: qty 1
  Filled 2022-07-12: qty 2
  Filled 2022-07-12 (×2): qty 1
  Filled 2022-07-12 (×5): qty 2
  Filled 2022-07-12 (×3): qty 1

## 2022-07-12 MED ORDER — FAMOTIDINE 20 MG PO TABS
20.0000 mg | ORAL_TABLET | Freq: Two times a day (BID) | ORAL | Status: DC
  Administered 2022-07-13 – 2022-07-19 (×12): 20 mg
  Filled 2022-07-12 (×12): qty 1

## 2022-07-12 MED ORDER — FENTANYL CITRATE PF 50 MCG/ML IJ SOSY
25.0000 ug | PREFILLED_SYRINGE | INTRAMUSCULAR | Status: AC | PRN
  Administered 2022-07-13 – 2022-07-15 (×3): 25 ug via INTRAVENOUS
  Filled 2022-07-12 (×2): qty 1

## 2022-07-12 MED ORDER — NOREPINEPHRINE 4 MG/250ML-% IV SOLN
INTRAVENOUS | Status: AC | PRN
  Administered 2022-07-12: 20 ug/min via INTRAVENOUS

## 2022-07-12 MED ORDER — SODIUM CHLORIDE 0.9 % IV SOLN
250.0000 mL | INTRAVENOUS | Status: DC
  Administered 2022-07-13: 250 mL via INTRAVENOUS

## 2022-07-12 MED ORDER — DOCUSATE SODIUM 50 MG/5ML PO LIQD
100.0000 mg | Freq: Two times a day (BID) | ORAL | Status: DC
  Administered 2022-07-13 – 2022-07-18 (×9): 100 mg
  Filled 2022-07-12 (×9): qty 10

## 2022-07-12 MED ORDER — HEPARIN (PORCINE) 25000 UT/250ML-% IV SOLN
1400.0000 [IU]/h | INTRAVENOUS | Status: DC
  Administered 2022-07-13: 800 [IU]/h via INTRAVENOUS
  Administered 2022-07-14: 1050 [IU]/h via INTRAVENOUS
  Administered 2022-07-15: 1150 [IU]/h via INTRAVENOUS
  Administered 2022-07-16: 1300 [IU]/h via INTRAVENOUS
  Administered 2022-07-17 – 2022-07-19 (×3): 1400 [IU]/h via INTRAVENOUS
  Filled 2022-07-12 (×8): qty 250

## 2022-07-12 MED ORDER — FUROSEMIDE 10 MG/ML IJ SOLN
40.0000 mg | Freq: Once | INTRAMUSCULAR | Status: AC
  Administered 2022-07-12: 40 mg via INTRAVENOUS
  Filled 2022-07-12: qty 4

## 2022-07-12 MED ORDER — POTASSIUM CHLORIDE 10 MEQ/100ML IV SOLN
10.0000 meq | INTRAVENOUS | Status: AC
  Administered 2022-07-12 – 2022-07-13 (×4): 10 meq via INTRAVENOUS
  Filled 2022-07-12 (×3): qty 100

## 2022-07-12 MED ORDER — AMIODARONE HCL IN DEXTROSE 360-4.14 MG/200ML-% IV SOLN: 60.0000 mg/h | INTRAVENOUS | Status: DC

## 2022-07-12 MED ORDER — ACETAMINOPHEN 160 MG/5ML PO SOLN
650.0000 mg | ORAL | Status: DC | PRN
  Administered 2022-07-15 – 2022-07-23 (×12): 650 mg
  Filled 2022-07-12 (×12): qty 20.3

## 2022-07-12 MED ORDER — PROPOFOL 1000 MG/100ML IV EMUL
INTRAVENOUS | Status: AC
  Administered 2022-07-12: 5 ug/kg/min via INTRAVENOUS
  Filled 2022-07-12: qty 100

## 2022-07-12 NOTE — ED Notes (Signed)
Dr. Glick at bedside.  

## 2022-07-12 NOTE — ED Provider Notes (Signed)
[facility] Provider Note   CSN: [id number] Arrival date & time: 07/12/22  0019     History  Chief Complaint  Patient presents with   Shortness of Breath    TORIA MONTE is a 72 y.o. female.   Shortness of Breath  Patient is a 72 year old female with past medical history significant for hypertension, HLD, anxiety, she has a complete heart block and is status post ventricular pacemaker, history of mitral regurg with CHF last echocardiogram was done 07/06/2022 that shows an EF of 25-30% with severe mitral regurg, prior to this her last echocardiogram was 50-55%   Patient states that she has been having progressively worsening shortness of breath particularly with exertion over the past 3 weeks.  She states that she went to see her cardiologist PA recently and had a echocardiogram done she does not know results of this yet.  She states that today on arrival back from being out of the house she had an episode where she felt quite short of breath for over an hour and was ultimately brought to the emergency department by EMS.  She was initially on oxygen because her SpO2 was 92%.  She was briefly on oxygen during transport.  She denies any episodes of chest pain.  No recent hospitalizations or surgeries or long travel no hemoptysis chest pain or estrogen-containing medications, she is not a cancer patient has no unilateral leg swelling no history of blood clots/VTE     Home Medications Prior to Admission medications   Medication Sig Start Date End Date Taking? Authorizing Provider  Ascorbic Acid (VITAMIN C) 1000 MG tablet Take 1,000 mg by mouth daily.    [provider]  buPROPion (WELLBUTRIN XL) 300 MG 24 hr tablet Take 1 tablet (300 mg total) by mouth daily. 06/16/22   [name], DO  busPIRone (BUSPAR) 15 MG tablet TAKE 1 TABLET 3 TIMES A DAY 06/16/22   Ronnie Doss M, DO  cholecalciferol (VITAMIN D) 25 MCG (1000  UNIT) tablet Take 1,000 Units by mouth daily.     [provider]  D-MANNOSE PO Take 1 tablet by mouth daily.    [provider]  esomeprazole (NEXIUM) 40 MG capsule TAKE ONE CAPSULE DAILY AT NOON 12/02/21   Nandigam, Venia Minks, MD  estradiol (ESTRACE) 0.1 MG/GM vaginal cream INSERT 1 GRAM VAGINALLY AT BEDTIME FOR 2 WEEKS, THEN TWICE A WEEK THEREAFTER 11/12/20   [provider]  fluticasone (FLONASE) 50 MCG/ACT nasal spray 1 SPRAY IN EACH NOSTRIL DAILY AS NEEDED FOR ALLERGIES 03/15/22   Ronnie Doss M, DO  furosemide (LASIX) 20 MG tablet Take 1 tablet by mouth daily for 3 days then take as needed. 06/30/22   Shirley Friar, PA-C  guaiFENesin (MUCINEX) 600 MG 12 hr tablet Take 600 mg by mouth 2 (two) times daily as needed for cough.    [provider]  levocetirizine (XYZAL) 5 MG tablet Take 1 tablet (5 mg total) by mouth every evening. 09/30/21   [name], DO  lisinopril-hydrochlorothiazide (ZESTORETIC) 20-12.5 MG tablet TAKE 1 TABLET EVERY MORNING 06/16/22   Gottschalk, Leatrice Jewels M, DO  LORazepam (ATIVAN) 1 MG tablet Take 1 tablet every night at bedtime.  May take 1/2 tablet during daytime if needed. 06/28/22   [name], DO  meclizine (ANTIVERT) 25 MG tablet Take 1 tablet (25 mg total) by mouth 3 (three) times daily as needed for dizziness. 04/07/18   Terald Sleeper,  PA-C  medroxyPROGESTERone (PROVERA) 2.5 MG tablet Take 1 tablet (2.5 mg total) by mouth daily. 06/16/22   [name], DO  methenamine (HIPREX) 1 g tablet Take 1 g by mouth 2 (two) times daily. 04/16/22   [provider]  metoprolol succinate (TOPROL XL) 25 MG 24 hr tablet Take 1 tablet (25 mg total) by mouth daily. 06/28/22   Shirley Friar, PA-C  mirtazapine (REMERON) 7.5 MG tablet Take 1 tablet (7.5 mg total) by mouth at bedtime. For panic/ sleep/ appetite 06/16/22   Ronnie Doss M, DO  polyethylene glycol powder (GLYCOLAX/MIRALAX) powder Take 1  Container by mouth daily as needed for mild constipation.    [provider]  rosuvastatin (CRESTOR) 10 MG tablet Take 1 tablet (10 mg total) by mouth daily. 06/16/22   [name], DO  trolamine salicylate (ASPERCREME) 10 % cream Apply 1 application  topically as needed for muscle pain.    [provider]  Zinc Sulfate (ZINC 15 PO) Take by mouth.    [provider]      Allergies    Zoloft [sertraline hcl], Amoxapine and related, Cephalexin, Oxycodone, and Penicillins    Review of Systems   Review of Systems  Respiratory:  Positive for shortness of breath.     Physical Exam Updated Vital Signs BP 133/81   Pulse 88   Temp 98.5 F (36.9 C) (Oral)   Resp 20   Wt 63 kg   SpO2 97%   BMI 26.24 kg/m  Physical Exam Vitals and nursing note reviewed.  Constitutional:      General: She is not in acute distress. HENT:     Head: Normocephalic and atraumatic.     Nose: Nose normal.     Mouth/Throat:     Mouth: Mucous membranes are moist.  Eyes:     General: No scleral icterus. Cardiovascular:     Rate and Rhythm: Normal rate and regular rhythm.     Pulses: Normal pulses.     Heart sounds: Normal heart sounds.  Pulmonary:     Effort: Pulmonary effort is normal. No respiratory distress.     Breath sounds: No wheezing.     Comments: Crackles in bilateral bases, faint wheeze Abdominal:     Palpations: Abdomen is soft.     Tenderness: There is no abdominal tenderness. There is no guarding or rebound.  Musculoskeletal:     Cervical back: Normal range of motion.     Right lower leg: Edema present.     Left lower leg: Edema present.  Skin:    General: Skin is warm and dry.     Capillary Refill: Capillary refill takes less than 2 seconds.  Neurological:     Mental Status: She is alert. Mental status is at baseline.  Psychiatric:        Mood and Affect: Mood normal.        Behavior: Behavior normal.     ED Results / Procedures / Treatments    Labs (all labs ordered are listed, but only abnormal results are displayed) Labs Reviewed  COMPREHENSIVE METABOLIC PANEL - Abnormal; Notable for the following components:      Result Value   Sodium 127 (*)    Chloride 96 (*)    Glucose, Bld 173 (*)    All other components within normal limits  BRAIN NATRIURETIC PEPTIDE - Abnormal; Notable for the following components:   B Natriuretic Peptide 547.6 (*)    All other components within  normal limits  TROPONIN I (HIGH SENSITIVITY) - Abnormal; Notable for the following components:   Troponin I (High Sensitivity) 19 (*)    All other components within normal limits  TROPONIN I (HIGH SENSITIVITY) - Abnormal; Notable for the following components:   Troponin I (High Sensitivity) 23 (*)    All other components within normal limits  RESP PANEL BY RT-PCR (RSV, FLU A&B, COVID)  RVPGX2  CBC WITH DIFFERENTIAL/PLATELET  URINALYSIS, ROUTINE W REFLEX MICROSCOPIC    EKG EKG Interpretation  Date/Time:  Monday July 12 2022 00:31:40 EST Ventricular Rate:  78 PR Interval:  202 QRS Duration: 184 QT Interval:  458 QTC Calculation: 522 R Axis:   97 Text Interpretation: Atrial-sensed ventricular-paced rhythm with occasional Premature ventricular complexes Abnormal ECG When compared with ECG of 22-Jan-2020 19:26, No significant change was found Confirmed by Delora Fuel ([id number]) on 07/12/2022 3:49:23 AM  Radiology DG Chest 2 View  Result Date: 07/12/2022 CLINICAL DATA:  Shortness of breath. EXAM: CHEST - 2 VIEW COMPARISON:  May 27, 2022 FINDINGS: There is stable multi lead AICD wire positioning. The heart size and mediastinal contours are within normal limits. Mild prominence of the perihilar pulmonary vasculature is seen. Mild, diffuse, chronic appearing increased lung markings are seen. Mild atelectasis is noted within the left lung base. There is no evidence of a pleural effusion or pneumothorax. The visualized skeletal structures are unremarkable.  IMPRESSION: 1. Mild pulmonary vascular congestion. 2. Mild left basilar atelectasis. Electronically Signed   By: Virgina Norfolk M.D.   On: 07/12/2022 01:10    Procedures Procedures    Medications Ordered in ED Medications  furosemide (LASIX) injection 40 mg (40 mg Intravenous Given 07/12/22 6073)    ED Course/ Medical Decision Making/ A&P Clinical Course as of 07/12/22 0508  Mon Jul 12, 2022  0302 No recent surgeries, hospitalization, long travel, hemoptysis, estrogen containing OCP, cancer history.  No unilateral leg swelling.  No history of PE or VTE.  [WF]  0303 No CP [WF]  0323 Sodium(!): 127 Hyponatremia likely delusional in the setting of CHF with fluid overload [WF]    Clinical Course User Index [WF] Tedd Sias, PA                             Medical Decision Making Amount and/or Complexity of Data Reviewed Labs:  Decision-making details documented in ED Course.  Risk Prescription drug management.    This patient presents to the ED for concern of shortness of breath, this involves a number of treatment options, and is a complaint that carries with it a moderate to high risk of complications and morbidity. A differential diagnosis was considered for the patient's symptoms which is discussed below:   The causes for shortness of breath include but are not limited to Cardiac (AHF, pericardial effusion and tamponade, arrhythmias, ischemia, etc) Respiratory (COPD, asthma, pneumonia, pneumothorax, primary pulmonary hypertension, PE/VQ mismatch) Hematological (anemia) Neuromuscular (ALS, Guillain-Barr, etc)    Co morbidities: Discussed in HPI   Brief History:  Patient is a 72 year old female with past medical history significant for hypertension, HLD, anxiety, she has a complete heart block and is status post ventricular pacemaker, history of mitral regurg with CHF last echocardiogram was done 07/06/2022 that shows an EF of 25-30% with severe mitral regurg,  prior to this her last echocardiogram was 50-55%   Patient states that she has been having progressively worsening shortness of breath particularly with exertion over  the past 3 weeks.  She states that she went to see her cardiologist PA recently and had a echocardiogram done she does not know results of this yet.  She states that today on arrival back from being out of the house she had an episode where she felt quite short of breath for over an hour and was ultimately brought to the emergency department by EMS.  She was initially on oxygen because her SpO2 was 92%.  She was briefly on oxygen during transport.  She denies any episodes of chest pain.  No recent hospitalizations or surgeries or long travel no hemoptysis chest pain or estrogen-containing medications, she is not a cancer patient has no unilateral leg swelling no history of blood clots/VTE    EMR reviewed including pt PMHx, past surgical history and past visits to ER.   See HPI for more details   Lab Tests:  I ordered and independently interpreted labs. Labs notable for  CBC without leukocytosis or anemia CMP with hyponatremia of 127 likely dilutional COVID influenza RSV negative.  Initial troponin elevated at 19 this is likely demand due to CHF exacerbation  Second troponin without significant delta.  BNP 547   Imaging Studies:  Abnormal findings. I personally reviewed all imaging studies. Imaging notable for Mild pulmonary edema   Cardiac Monitoring:  The patient was maintained on a cardiac monitor.  I personally viewed and interpreted the cardiac monitored which showed an underlying rhythm of: Paced ventricular EKG non-ischemic    Medicines ordered:  I ordered medication including Lasix for fluid overload Reevaluation of the patient after these medicines showed that the patient improved I have reviewed the patients home medicines and have made adjustments as needed   Critical  Interventions:     Consults/Attending Physician  I discussed this case with my attending physician who cosigned this note including patient's presenting symptoms, physical exam, and planned diagnostics and interventions. Attending physician stated agreement with plan or made changes to plan which were implemented.   Reevaluation:  After the interventions noted above I re-evaluated patient and found that they have :improved   Social Determinants of Health:      Problem List / ED Course:  CHF with volume overload.  No hypoxia and overall well-appearing.  Will give IV diuresis and recommend follow-up with cardiology she is scheduled to see them just a few hours from now in office.  Had a recent echocardiogram that was with diminished EF down to 25-30% which is worse than it was in 2021. Ambulated without desaturation.  Feels improved.  Feels comfortable discharge home.   Dispostion:  After consideration of the diagnostic results and the patients response to treatment, I feel that the patent would benefit from close cardiology follow-up later today   Final Clinical Impression(s) / ED Diagnoses Final diagnoses:  Other hypervolemia  HFrEF (heart failure with reduced ejection fraction) [facility])    Rx / DC Orders ED Discharge Orders     None         Tedd Sias, [place] 29/93/71 [id number]    Delora Fuel, MD 89/38/10 0700

## 2022-07-12 NOTE — Consult Note (Addendum)
Cardiology Consultation   Patient ID: Carmen Green MRN: [id number]; DOB: [birth year]  Admit date: 07/12/2022 Date of Consult: 07/12/2022  PCP:  [name], [place] [facility] Cardiologist:  None  Electrophysiologist:  [name], MD       Patient Profile:   Carmen Green is a 72 y.o. female with a hx of hypertension, hyperlipidemia, permanent pacemaker for complete heart block (St Jude), recently diagnosed HFrEF (EF 25%) and severe MR who is being seen 07/12/2022 for post cardiac arrest at the request of Dr. Darl Householder.  History of Present Illness:   [name] is a 72 y.o. female with a hx of hypertension, hyperlipidemia, permanent pacemaker for complete heart block (St Jude), recently diagnosed HFrEF (EF 25%) and severe MR who is being seen 07/12/2022 for post cardiac arrest at the request of Dr. Darl Householder. She came to the ED yesterday for SOB and fatigued- she was given IV lasix. She felt much better and she felt like going home. She was also seen in EP clinic today where new medications were introduced for new HFrEF- she was supposed to take tonight. At that time, denied chest pain. No orthopnea or edema.  She was doing well when suddenly tonight at 22, she suddenly dropped. EMS was called. Arrest was witnessed. She was shocked 2 times- Vfib was seen in the monitor. She got 4 rounds of Epi overall. Total downtime remains unclear. She was brought to the ED here- she was intubated and sedated. Nor Epi was started for low BP- now her BP have recovered well. HR around 70-80s with some PVCs and Afib. Cr is 1.3- LFTs are elevated. She is making urine. Troponin 463.    Past Medical History:  Diagnosis Date   Allergy    Anemia    Anxiety    Arthritis    Cataract    bil cataracts removed   Colon polyp    Complete heart block (HCC)    Contusion of chest wall 10/10/2007   Centricity Description: CHEST WALL CONTUSION Qualifier: Diagnosis of  By: Aline Brochure MD,  Dorothyann Peng   Centricity Description: CONTUSION, RIGHT RIB Qualifier: Diagnosis of  By: Aline Brochure MD, Stanley     Depression    Diverticulitis    Esophagitis    ESOPHAGITIS 06/25/2008   Qualifier: History of  By: Harlon Ditty CMA (AAMA), Dottie     GERD (gastroesophageal reflux disease)    H/O seasonal allergies    Heart murmur    Hiatal hernia    Hyperlipidemia    s/p PPM implant by Dr Olevia Perches 1992 with generator change 2004 (SJM), she is device dependant   Hypertension    Mitral valve prolapse    Pacemaker     Past Surgical History:  Procedure Laterality Date   APPENDECTOMY     BREAST EXCISIONAL BIOPSY Right 07/08/1998   BREAST SURGERY Right    biopsy   COLONOSCOPY  10/19/2004   [place]   EYE SURGERY Left    cataract extraction with IOL   LAPAROSCOPIC LOW ANTERIOR RESECTION  11/16/2013   Robotic   LAPAROSCOPIC LYSIS OF ADHESIONS N/A 11/15/2013   Procedure: LAPAROSCOPIC LYSIS OF ADHESIONS;  Surgeon: Adin Hector, MD;  Location: [facility];  Service: General;  Laterality: N/A;   left ovary cyst removal  [place] 01/22/2020   Procedure: PACEMAKER IMPLANT;  Surgeon: Thompson Grayer, MD;  Location: [facility];  Service: Cardiovascular;  Laterality: N/A;   PACEMAKER  INSERTION  1992, 2004, 2012   device dependant   POLYPECTOMY     PROCTOSCOPY  11/15/2013   Procedure: RIGID PROCTOSCOPY;  Surgeon: Adin Hector, MD;  Location: [facility];  Service: General;;   ROBOTIC ASSISTED SALPINGO OOPHERECTOMY Left 11/16/2013   Robotic en bloc w LAR resection   TUBAL LIGATION     UPPER GASTROINTESTINAL ENDOSCOPY         Inpatient Medications: Scheduled Meds:  Continuous Infusions:  norepinephrine 10 mcg/min (07/12/22 2127)   propofol (DIPRIVAN) infusion 5 mcg/kg/min (07/12/22 1943)   PRN Meds: etomidate, norepinephrine, rocuronium  Allergies:    Allergies  Allergen Reactions   Zoloft [Sertraline Hcl] Nausea Only        Amoxapine And Related Rash   Cephalexin Rash    Oxycodone     "Loopy"   Penicillins Rash    Social History:   Social History   Socioeconomic History   Marital status: Married    Spouse name: Gwyndolyn Saxon   Number of children: 1   Years of education: Not on file   Highest education level: Not on file  Occupational History   Occupation: REGISTRATION   Occupation: retired  Tobacco Use   Smoking status: Never   Smokeless tobacco: Never  Vaping Use   Vaping Use: Never used  Substance and Sexual Activity   Alcohol use: No   Drug use: No   Sexual activity: Not on file  Other Topics Concern   Not on file  Social History Narrative   Lives with spouse in [place] Strain: [place]  (01/08/2022)   Overall Financial Resource Strain (CARDIA)    Difficulty of Paying Living Expenses: Not hard at all  Food Insecurity: No Food Insecurity (01/08/2022)   Hunger Vital Sign    Worried About Running Out of Food in the Last Year: Never true    [place] in the Last Year: Never true  Transportation Needs: No Transportation Needs (01/08/2022)   PRAPARE - Hydrologist (Medical): No    Lack of Transportation (Non-Medical): No  Physical Activity: Insufficiently Active (01/08/2022)   Exercise Vital Sign    Days of Exercise per Week: 1 day    Minutes of Exercise per Session: 30 min  Stress: No Stress Concern Present (01/08/2022)   [place]    Feeling of Stress : Not at all  Social Connections: [place] (01/08/2022)   Social Connection and Isolation Panel [NHANES]    Frequency of Communication with Friends and Family: More than three times a week    Frequency of Social Gatherings with Friends and Family: More than three times a week    Attends Religious Services: More than 4 times per year    Active Member of [employer] or Organizations: Yes    Attends Music therapist: More than  4 times per year    Marital Status: Married  Human resources officer Violence: Not At Risk (01/08/2022)   Humiliation, Afraid, Rape, and Kick questionnaire    Fear of Current or Ex-Partner: No    Emotionally Abused: No    Physically Abused: No    Sexually Abused: No    Family History:   Family History  Problem Relation Age of Onset   Diabetes Mother    Stroke Mother    Diabetes Father    Lung cancer Father    Heart disease  Maternal Aunt    Breast cancer Maternal Aunt    Heart disease Maternal Grandmother    Lung cancer Maternal Grandfather    Colon cancer Neg Hx    Esophageal cancer Neg Hx    Pancreatic cancer Neg Hx    Rectal cancer Neg Hx    Stomach cancer Neg Hx      ROS:  Please see the history of present illness.   All other ROS reviewed and negative.     Physical Exam/Data:   Vitals:   07/12/22 2110 07/12/22 2115 07/12/22 2120 07/12/22 2125  BP: (!) 158/90 (!) 158/91 (!) 150/93 (!) 157/92  Pulse: 80 (!) 40 81 79  Resp: '16 16 16 20  '$ Temp: (!) 96.8 F (36 C) (!) 96.8 F (36 C) (!) 96.9 F (36.1 C) (!) 96.9 F (36.1 C)  SpO2: 100% 100% 100% 100%   No intake or output data in the 24 hours ending 07/12/22 2130    07/12/2022    8:48 AM 07/12/2022   12:31 AM 06/28/2022   10:01 AM  Last 3 Weights  Weight (lbs) 137 lb 138 lb 14.2 oz 140 lb 3.2 oz  Weight (kg) 62.143 kg 63 kg 63.594 kg     There is no height or weight on file to calculate BMI.  General:  Intubated and sedated HEENT: normal Neck: no JVD Vascular: No carotid bruits; Distal pulses 2+ bilaterally Cardiac:  Holosystolic murmur; irregular RR.  Lungs:  Mechanical BS Abd: soft, nontender, no hepatomegaly  Ext: no edema Musculoskeletal:  No deformities.  Skin: warm and dry  Neuro:  Cannot assess Psych:  Cannot assess  EKG:  The EKG was personally reviewed and demonstrates:  No ST elevation  Relevant CV Studies:  Device History: St. Jude Dual Chamber PPM implanted 1992 for CHB, gen change 2004, gen  change 2012, Abandoned R side device and L sided device implant 01/2020  Echo 2024:  1. Left ventricular ejection fraction, by estimation, is 25 to 30%. The left ventricle has severely decreased function. The left ventricle demonstrates global hypokinesis. The left ventricular internal cavity size was mildly dilated. There is mild asymmetric left ventricular hypertrophy of the basal-septal segment. Left ventricular diastolic parameters are consistent with Grade II diastolic dysfunction (pseudonormalization). 2. Right ventricular systolic function is mildly reduced. The right ventricular size is normal. There is moderately elevated pulmonary artery systolic pressure. The estimated right ventricular systolic pressure is 24.4 mmHg. 3. Left atrial size was severely dilated. 4. The mitral valve is abnormal, the valve is calcified and posterior leaflet appears restricted. Severe mitral valve regurgitation. No evidence of mitral stenosis. 5. Tricuspid valve regurgitation is mild to moderate. 6. The aortic valve is tricuspid. Aortic valve regurgitation is not visualized. No aortic stenosis is present. 7. The inferior vena cava is normal in size with greater than 50% respiratory variability, suggesting right atrial pressure of 3 mmHg. 8. Function appears significant worse than prior echo, consider CHF clinic referral.    Laboratory Data:  High Sensitivity Troponin:   Recent Labs  Lab 07/12/22 0057 07/12/22 0248 07/12/22 1945  TROPONINIHS 19* 23* 463*     Chemistry Recent Labs  Lab 07/12/22 0057 07/12/22 1945 07/12/22 1951 07/12/22 1959 07/12/22 2116  NA 127* 130* 135 135 135  K 3.7 3.2* 3.3* 3.2* 3.7  CL 96* 100 100 100  --   CO2 22 17*  --   --   --   GLUCOSE 173* 198* 197* 196*  --  BUN '11 16 19 18  '$ --   CREATININE 0.95 1.44* 1.30* 1.30*  --   CALCIUM 9.0 8.0*  --   --   --   GFRNONAA >60 38*  --   --   --   ANIONGAP 9 13  --   --   --     Recent Labs  Lab 07/12/22 0057  07/12/22 1945  PROT 6.7 6.4*  ALBUMIN 3.9 3.7  AST 26 717*  ALT 16 627*  ALKPHOS 53 77  BILITOT 0.6 0.9   Lipids No results for input(s): "CHOL", "TRIG", "HDL", "LABVLDL", "LDLCALC", "CHOLHDL" in the last 168 hours.  Hematology Recent Labs  Lab 07/12/22 0057 07/12/22 1945 07/12/22 1951 07/12/22 1959 07/12/22 2116  WBC 7.9 12.3*  --   --   --   RBC 3.90 4.07  --   --   --   HGB 12.9 13.5 13.6 13.6 13.6  HCT 36.9 40.4 40.0 40.0 40.0  MCV 94.6 99.3  --   --   --   MCH 33.1 33.2  --   --   --   MCHC 35.0 33.4  --   --   --   RDW 11.6 11.6  --   --   --   PLT 201 177  --   --   --    Thyroid No results for input(s): "TSH", "FREET4" in the last 168 hours.  BNP Recent Labs  Lab 07/12/22 0057  BNP 547.6*    DDimer No results for input(s): "DDIMER" in the last 168 hours.   Radiology/Studies:  DG Chest Port 1 View  Result Date: 07/12/2022 CLINICAL DATA:  Intubation EXAM: PORTABLE CHEST 1 VIEW COMPARISON:  Chest x-ray dated July 12, 2022 FINDINGS: ETT tip is proximally 2 cm from the carina. Esophageal temperature probe noted. NG/OG tube is partially visualized coursing below the diaphragm. Left chest wall dual lead pacer unchanged lead position. Capped right chest wall leads. Cardiomegaly, similar to prior when accounting for differences in exam technique. New diffuse interstitial opacities, likely due to pulmonary edema. No large pleural effusion or pneumothorax. New right-sided rib fractures. IMPRESSION: 1. ETT tip is proximally 2 cm from the carina. 2. Diffuse interstitial opacities, likely due to pulmonary edema. 3. New right-sided rib fractures. Electronically Signed   By: Yetta Glassman M.D.   On: 07/12/2022 20:26   DG Chest 2 View  Result Date: 07/12/2022 CLINICAL DATA:  Shortness of breath. EXAM: CHEST - 2 VIEW COMPARISON:  May 27, 2022 FINDINGS: There is stable multi lead AICD wire positioning. The heart size and mediastinal contours are within normal limits. Mild  prominence of the perihilar pulmonary vasculature is seen. Mild, diffuse, chronic appearing increased lung markings are seen. Mild atelectasis is noted within the left lung base. There is no evidence of a pleural effusion or pneumothorax. The visualized skeletal structures are unremarkable. IMPRESSION: 1. Mild pulmonary vascular congestion. 2. Mild left basilar atelectasis. Electronically Signed   By: Virgina Norfolk M.D.   On: 07/12/2022 01:10     Assessment and Plan:   # Cardiac Arrest # Vfib # Acute on Chronic HFrEF # Severe MR # CHB s/p St Jude PPM # HTN # HLD  -Most likely cause of cardiac arrest seems like Vfib from progressive HF -Echo shows low EF with severe MR -Her new drop in EF maybe due to chronic RV pacing (she is RV paced >90%) -Formal Echo in AM -Will need LHC to rule out ischemia in  AM -K >4 and Mg >2 -Will start heparin for Afib/?ACS after CT head -Will do Amiodarone for arrhthymias/PVCs -Will eventually need ICD -Will give IV Lasix 60 for lung infiltrates -By Hx does not seem like this is due to severe MR- moreover her vent settings are minimal for now. Will continue to monitor closely for MCS needs.  -Will start Milrinone 0.25 mg -MAP >65- wean off Norepi accordingly -Trend CMP and Lactate Q6 -PICC line to assess for Co-Oxy and CVP -Ventilator/crit care management per CCM  Signed, Jaci Lazier, MD  07/12/2022 9:30 PM

## 2022-07-12 NOTE — Progress Notes (Signed)
ANTICOAGULATION CONSULT NOTE - Initial Consult  Pharmacy Consult for heparin Indication:  Afib with possible ACS  Allergies  Allergen Reactions   Zoloft [Sertraline Hcl] Nausea Only        Amoxapine And Related Rash   Cephalexin Rash   Oxycodone     "Loopy"   Penicillins Rash    Patient Measurements: Height: '5\' 1"'$  (154.9 cm) Weight: 62.1 kg (136 lb 14.5 oz) IBW/kg (Calculated) : 47.8 Heparin Dosing Weight: 60kg  Vital Signs: Temp: 98.1 F (36.7 C) (01/22 2310) BP: 158/118 (01/22 2310) Pulse Rate: 104 (01/22 2310)  Labs: Recent Labs    07/12/22 0057 07/12/22 0248 07/12/22 1945 07/12/22 1951 07/12/22 1959 07/12/22 2116 07/12/22 2223  HGB 12.9  --  13.5 13.6 13.6 13.6  --   HCT 36.9  --  40.4 40.0 40.0 40.0  --   PLT 201  --  177  --   --   --   --   CREATININE 0.95  --  1.44* 1.30* 1.30*  --   --   TROPONINIHS 19* 23* 463*  --   --   --  753*    Estimated Creatinine Clearance: 32.6 mL/min (A) (by C-G formula based on SCr of 1.3 mg/dL (H)).   Medical History: Past Medical History:  Diagnosis Date   Allergy    Anemia    Anxiety    Arthritis    Cataract    bil cataracts removed   Colon polyp    Complete heart block (HCC)    Contusion of chest wall 10/10/2007   Centricity Description: CHEST WALL CONTUSION Qualifier: Diagnosis of  By: Aline Brochure MD, Dorothyann Peng   Centricity Description: CONTUSION, RIGHT RIB Qualifier: Diagnosis of  By: Aline Brochure MD, Stanley     Depression    Diverticulitis    Esophagitis    ESOPHAGITIS 06/25/2008   Qualifier: History of  By: Harlon Ditty CMA (AAMA), Dottie     GERD (gastroesophageal reflux disease)    H/O seasonal allergies    Heart murmur    Hiatal hernia    Hyperlipidemia    s/p PPM implant by Dr Olevia Perches 1992 with generator change 2004 (SJM), she is device dependant   Hypertension    Mitral valve prolapse    Pacemaker     Assessment: 72yo female had witnessed arrest at home, required four rounds and shock x2 before  ROSC, to begin heparin for Afib and possible ACS.  Goal of Therapy:  Heparin level 0.3-0.7 units/ml Monitor platelets by anticoagulation protocol: Yes   Plan:  Heparin 2000 units IV bolus x1 followed by infusion at 800 units/hr. Monitor heparin levels and CBC.  Wynona Neat, PharmD, BCPS  07/12/2022,11:35 PM

## 2022-07-12 NOTE — ED Notes (Signed)
Pt ambulatory with steady gait; 02 sats 96-99% on RA; pt denies dyspnea w/ exertion at present, states "I'm feeling back to normal"; NAD

## 2022-07-12 NOTE — H&P (Signed)
NAME:  Carmen Green, MRN:  [id number], DOB:  December 17, 1950, LOS: 0 ADMISSION DATE:  07/12/2022, CONSULTATION DATE: 1/22 REFERRING MD: Dr. Darl Householder EDP, CHIEF COMPLAINT: Cardiac arrest  History of Present Illness:  72 year old female with past medical history as below, which is significant for complete heart block status post pacemaker, mitral valve prolapse, GERD, and esophagitis.  She is also recently been undergoing additional cardiac workup after presenting to the cardiology office on 1/8 with new complaints of shortness of breath and fatigue.  Echocardiogram done 1/16 demonstrated newly reduced LVEF to 25% with severe mitral regurgitation.  There were plans for her to undergo TEE and left/right heart cath, however, she unfortunately presented to [facility] emergency department on 1/22 after suffering cardiac arrest at home at approximately 1830.  Bystanders report she fell to the ground.  Sounds like she did receive bystander CPR and first responders placed AED which did not advise shock.  After EMS arrived and placed cardiac monitor she was in PEA.  Rhythm did eventually changed to V-fib at which point she was defibrillated and ROSC was achieved.  King airway was exchanged for endotracheal tube in the emergency department.  Patient was hypotensive requiring norepinephrine infusion.  EKG with nonspecific possibly ischemic changes.  Cardiology was consulted by the EDP.  PCCM has been asked to admit.  Pertinent  Medical History   has a past medical history of Allergy, Anemia, Anxiety, Arthritis, Cataract, Colon polyp, Complete heart block ([place]), Contusion of chest wall (10/10/2007), Depression, Diverticulitis, Esophagitis, ESOPHAGITIS (06/25/2008), GERD (gastroesophageal reflux disease), H/O seasonal allergies, Heart murmur, Hiatal hernia, Hyperlipidemia, Hypertension, Mitral valve prolapse, and Pacemaker.   Significant Hospital Events: Including procedures, antibiotic start and stop dates in addition to  other pertinent events   Admission following cardiac arrest  Interim History / Subjective:    Objective   Blood pressure 133/80, pulse (!) 52, temperature (!) 97.4 F (36.3 C), resp. rate 20, SpO2 100 %.    Vent Mode: PRVC FiO2 (%):  [60 %-100 %] 60 % Set Rate:  [16 bmp-20 bmp] 20 bmp Vt Set:  [380 mL] 380 mL PEEP:  [5 cmH20] 5 cmH20 Plateau Pressure:  [15 cmH20] 15 cmH20  No intake or output data in the 24 hours ending 07/12/22 2248 There were no vitals filed for this visit.  Examination: General: Elderly appearing female of normal body habitus on ventilator HENT: Normocephalic, atraumatic, PERRL, no appreciable JVD Lungs: Clear bilateral breath sounds Cardiovascular: Rapid atrial flutter on monitor.  Irregularly irregular. 4/6 murmur Abdomen: Soft, non-distended.  Extremities: No acute deformity or edema Neuro: Unresponsive - examined on propofol.   Significant labs K: 3.2, Na 130, Bicarb 17, creatinine 1.44, AST 717, Troponin 463, WBC 12.3, Glucose 198  Resolved Hospital Problem list     Assessment & Plan:   Cardiac arrest: Suspect in setting that was cardiac in nature given recent history and sudden collapse.  EKG in the ED with potential signs of ischemia, however, in the immediate postarrest setting the significance is uncertain. - CT head - repeat echocardiogram - EEG - TTM protocol - Neuro-supportive measures - Heparin infusion for possible ischemia plus a flutter - trend troponin  Acute hypoxemic respiratory failure: pulmonary edema - Improved with positive pressure - Full vent support - ABG reviewed and settings adjusted.  - Propofol and PRN fentanyl for RASS goal 0 to -1.  - Daily assessment for SAT/SBT  Acute on chronic HFrEF Cardiogenic shock Atrial flutter - Cardiology consulted - Will place  CVL to check co-ox - Cardiology consulted - NE infusion to keep MAP goal 65. Weaning.  - Trend lactic acid  R rib fx: s/p CPR - Supportive care - Pain  management  AKI Hypokalemia - trend BMP - replete K  Best Practice (right click and "Reselect all SmartList Selections" daily)   Diet/type: NPO DVT prophylaxis: systemic heparin GI prophylaxis: H2B Lines: N/A Foley:  Yes, and it is still needed Code Status:  DNR Last date of multidisciplinary goals of care discussion [Discussed [place] with husband. He describes a living will, which indicates DNR, but OK with ongoing critical care for now.]  Labs   CBC: Recent Labs  Lab 07/12/22 0057 07/12/22 1945 07/12/22 1951 07/12/22 1959 07/12/22 2116  WBC 7.9 12.3*  --   --   --   NEUTROABS 6.4 8.5*  --   --   --   HGB 12.9 13.5 13.6 13.6 13.6  HCT 36.9 40.4 40.0 40.0 40.0  MCV 94.6 99.3  --   --   --   PLT 201 177  --   --   --     Basic Metabolic Panel: Recent Labs  Lab 07/12/22 0057 07/12/22 1945 07/12/22 1951 07/12/22 1959 07/12/22 2116  NA 127* 130* 135 135 135  K 3.7 3.2* 3.3* 3.2* 3.7  CL 96* 100 100 100  --   CO2 22 17*  --   --   --   GLUCOSE 173* 198* 197* 196*  --   BUN '11 16 19 18  '$ --   CREATININE 0.95 1.44* 1.30* 1.30*  --   CALCIUM 9.0 8.0*  --   --   --    GFR: Estimated Creatinine Clearance: 32.6 mL/min (A) (by C-G formula based on SCr of 1.3 mg/dL (H)). Recent Labs  Lab 07/12/22 0057 07/12/22 1945  WBC 7.9 12.3*    Liver Function Tests: Recent Labs  Lab 07/12/22 0057 07/12/22 1945  AST 26 717*  ALT 16 627*  ALKPHOS 53 77  BILITOT 0.6 0.9  PROT 6.7 6.4*  ALBUMIN 3.9 3.7   No results for input(s): "LIPASE", "AMYLASE" in the last 168 hours. No results for input(s): "AMMONIA" in the last 168 hours.  ABG    Component Value Date/Time   PHART 7.196 (LL) 07/12/2022 2116   PCO2ART 52.3 (H) 07/12/2022 2116   PO2ART 224 (H) 07/12/2022 2116   HCO3 20.7 07/12/2022 2116   TCO2 22 07/12/2022 2116   ACIDBASEDEF 8.0 (H) 07/12/2022 2116   O2SAT 100 07/12/2022 2116     Coagulation Profile: No results for input(s): "INR", "PROTIME" in the last 168  hours.  Cardiac Enzymes: No results for input(s): "CKTOTAL", "CKMB", "CKMBINDEX", "TROPONINI" in the last 168 hours.  HbA1C: No results found for: "HGBA1C"  CBG: No results for input(s): "GLUCAP" in the last 168 hours.  Review of Systems:   Patient is encephalopathic and/or intubated. Therefore history has been obtained from chart review.   Past Medical History:  She,  has a past medical history of Allergy, Anemia, Anxiety, Arthritis, Cataract, Colon polyp, Complete heart block ([place]), Contusion of chest wall (10/10/2007), Depression, Diverticulitis, Esophagitis, ESOPHAGITIS (06/25/2008), GERD (gastroesophageal reflux disease), H/O seasonal allergies, Heart murmur, Hiatal hernia, Hyperlipidemia, Hypertension, Mitral valve prolapse, and Pacemaker.   Surgical History:   Past Surgical History:  Procedure Laterality Date   APPENDECTOMY     BREAST EXCISIONAL BIOPSY Right 07/08/1998   BREAST SURGERY Right    biopsy   COLONOSCOPY  10/19/2004  [place]   EYE SURGERY Left    cataract extraction with IOL   LAPAROSCOPIC LOW ANTERIOR RESECTION  11/16/2013   Robotic   LAPAROSCOPIC LYSIS OF ADHESIONS N/A 11/15/2013   Procedure: LAPAROSCOPIC LYSIS OF ADHESIONS;  Surgeon: Adin Hector, MD;  Location: [facility];  Service: General;  Laterality: N/A;   left ovary cyst removal  [place] N/A 01/22/2020   Procedure: PACEMAKER IMPLANT;  Surgeon: Thompson Grayer, MD;  Location: [place] [facility];  Service: Cardiovascular;  Laterality: N/A;   [place], 2004, 2012   device dependant   POLYPECTOMY     PROCTOSCOPY  11/15/2013   Procedure: RIGID PROCTOSCOPY;  Surgeon: Adin Hector, MD;  Location: [facility];  Service: General;;   ROBOTIC ASSISTED SALPINGO OOPHERECTOMY Left 11/16/2013   Robotic en bloc w LAR resection   TUBAL LIGATION     UPPER GASTROINTESTINAL ENDOSCOPY       Social History:   reports that she has never smoked. She has never used smokeless tobacco. She reports  that she does not drink alcohol and does not use drugs.   Family History:  Her family history includes Breast cancer in her maternal aunt; Diabetes in her father and mother; Heart disease in her maternal aunt and maternal grandmother; Lung cancer in her father and maternal grandfather; Stroke in her mother. There is no history of Colon cancer, Esophageal cancer, Pancreatic cancer, Rectal cancer, or Stomach cancer.   Allergies Allergies  Allergen Reactions   Zoloft [Sertraline Hcl] Nausea Only        Amoxapine And Related Rash   Cephalexin Rash   Oxycodone     "Loopy"   Penicillins Rash     Home Medications  Prior to Admission medications   Medication Sig Start Date End Date Taking? Authorizing Provider  Ascorbic Acid (VITAMIN C) 1000 MG tablet Take 1,000 mg by mouth daily.    [provider]  buPROPion (WELLBUTRIN XL) 300 MG 24 hr tablet Take 1 tablet (300 mg total) by mouth daily. 06/16/22   [name], DO  busPIRone (BUSPAR) 15 MG tablet TAKE 1 TABLET 3 TIMES A DAY 06/16/22   Ronnie Doss M, DO  cholecalciferol (VITAMIN D) 25 MCG (1000 UNIT) tablet Take 1,000 Units by mouth daily.     [provider]  D-MANNOSE PO Take 1 tablet by mouth daily.    [provider]  esomeprazole (NEXIUM) 40 MG capsule TAKE ONE CAPSULE DAILY AT NOON 12/02/21   Nandigam, Venia Minks, MD  estradiol (ESTRACE) 0.1 MG/GM vaginal cream INSERT 1 GRAM VAGINALLY AT BEDTIME FOR 2 WEEKS, THEN TWICE A WEEK THEREAFTER 11/12/20   [provider]  fluticasone (FLONASE) 50 MCG/ACT nasal spray 1 SPRAY IN EACH NOSTRIL DAILY AS NEEDED FOR ALLERGIES 03/15/22   Ronnie Doss M, DO  furosemide (LASIX) 20 MG tablet Take 1 tablet by mouth on Monday and Friday than take as needed. 07/12/22   Shirley Friar, PA-C  guaiFENesin (MUCINEX) 600 MG 12 hr tablet Take 600 mg by mouth 2 (two) times daily as needed for cough.    [provider]  levocetirizine (XYZAL) 5 MG  tablet Take 1 tablet (5 mg total) by mouth every evening. 09/30/21   [name], DO  LORazepam (ATIVAN) 1 MG tablet Take 1 tablet every night at bedtime.  May take 1/2 tablet during daytime if needed. 06/28/22   [name], DO  losartan (COZAAR) 25 MG tablet Take  1 tablet (25 mg total) by mouth daily. 07/12/22   Shirley Friar, PA-C  meclizine (ANTIVERT) 25 MG tablet Take 1 tablet (25 mg total) by mouth 3 (three) times daily as needed for dizziness. 04/07/18   Terald Sleeper, PA-C  medroxyPROGESTERone (PROVERA) 2.5 MG tablet Take 1 tablet (2.5 mg total) by mouth daily. 06/16/22   [name], DO  methenamine (HIPREX) 1 g tablet Take 1 g by mouth 2 (two) times daily. 04/16/22   [provider]  metoprolol succinate (TOPROL XL) 25 MG 24 hr tablet Take 1 tablet (25 mg total) by mouth daily. 06/28/22   Shirley Friar, PA-C  mirtazapine (REMERON) 7.5 MG tablet Take 1 tablet (7.5 mg total) by mouth at bedtime. For panic/ sleep/ appetite 06/16/22   Ronnie Doss M, DO  polyethylene glycol powder (GLYCOLAX/MIRALAX) powder Take 1 Container by mouth daily as needed for mild constipation.    [provider]  rosuvastatin (CRESTOR) 10 MG tablet Take 1 tablet (10 mg total) by mouth daily. 06/16/22   [name], DO  spironolactone (ALDACTONE) 25 MG tablet Take 1 tablet (25 mg total) by mouth daily. 07/12/22   Shirley Friar, PA-C  trolamine salicylate (ASPERCREME) 10 % cream Apply 1 application  topically as needed for muscle pain.    [provider]  Zinc Sulfate (ZINC 15 PO) Take by mouth.    [provider]     Critical care time: 52 minutes      Georgann Housekeeper, AGACNP-BC [place] for personal pager PCCM on call pager [phone] until 7pm. Please call Elink 7p-7a. [phone]  07/12/2022 11:44 PM

## 2022-07-12 NOTE — ED Provider Notes (Signed)
[facility] Provider Note   CSN: [id number] Arrival date & time: 07/12/22  1930     History  Chief Complaint  Patient presents with   Cardiac Arrest    Carmen Green is a 72 y.o. female.  Carmen Green is a 72 year old female with past medical history significant for hypertension, HLD, anxiety, she has a complete heart block and is status post ventricular pacemaker, history of mitral regurg with CHF last echocardiogram was done 07/06/2022 that shows an EF of 25-30% with severe mitral regurg, prior to this her last echocardiogram was 50-55%.  Patient was recently seen at [facility] 06/2020 in the late evening for acute volume overload with new O2 requirement.  Patient received 40 mg of IV Lasix in the emergency department was otherwise clinically stable and discharged home she followed up with her electrophysiology clinic on the morning of 06/2020 it appeared that plan was for her to follow-up with left heart cath given worsening heart failure and the above noted depression of her ejection fraction.  Patient then had a witnessed arrest at home this evening at around 630 she fell to the ground initial rhythm unable to be obtained however AED stated not shockable rhythm patient received 2 rounds prior to EMS arriving placing their monitor.  Ultimately patient received 4 rounds of epinephrine prior to having rhythm changed to V-fib at which point she was shocked and ROSC was achieved.    Cardiac Arrest      Home Medications Prior to Admission medications   Medication Sig Start Date End Date Taking? Authorizing Provider  Ascorbic Acid (VITAMIN C) 1000 MG tablet Take 1,000 mg by mouth daily.    [provider]  buPROPion (WELLBUTRIN XL) 300 MG 24 hr tablet Take 1 tablet (300 mg total) by mouth daily. 06/16/22   [name], DO  busPIRone (BUSPAR) 15 MG tablet TAKE 1 TABLET 3 TIMES A DAY 06/16/22   Ronnie Doss M, DO   cholecalciferol (VITAMIN D) 25 MCG (1000 UNIT) tablet Take 1,000 Units by mouth daily.     [provider]  D-MANNOSE PO Take 1 tablet by mouth daily.    [provider]  esomeprazole (NEXIUM) 40 MG capsule TAKE ONE CAPSULE DAILY AT NOON 12/02/21   Nandigam, Venia Minks, MD  estradiol (ESTRACE) 0.1 MG/GM vaginal cream INSERT 1 GRAM VAGINALLY AT BEDTIME FOR 2 WEEKS, THEN TWICE A WEEK THEREAFTER 11/12/20   [provider]  fluticasone (FLONASE) 50 MCG/ACT nasal spray 1 SPRAY IN EACH NOSTRIL DAILY AS NEEDED FOR ALLERGIES 03/15/22   Ronnie Doss M, DO  furosemide (LASIX) 20 MG tablet Take 1 tablet by mouth on Monday and Friday than take as needed. 07/12/22   Shirley Friar, PA-C  guaiFENesin (MUCINEX) 600 MG 12 hr tablet Take 600 mg by mouth 2 (two) times daily as needed for cough.    [provider]  levocetirizine (XYZAL) 5 MG tablet Take 1 tablet (5 mg total) by mouth every evening. 09/30/21   [name], DO  LORazepam (ATIVAN) 1 MG tablet Take 1 tablet every night at bedtime.  May take 1/2 tablet during daytime if needed. 06/28/22   [name], DO  losartan (COZAAR) 25 MG tablet Take 1 tablet (25 mg total) by mouth daily. 07/12/22   Shirley Friar, PA-C  meclizine (ANTIVERT) 25 MG tablet Take 1 tablet (25 mg total) by mouth 3 (three) times daily as needed for dizziness.  04/07/18   Terald Sleeper, PA-C  medroxyPROGESTERone (PROVERA) 2.5 MG tablet Take 1 tablet (2.5 mg total) by mouth daily. 06/16/22   [name], DO  methenamine (HIPREX) 1 g tablet Take 1 g by mouth 2 (two) times daily. 04/16/22   [provider]  metoprolol succinate (TOPROL XL) 25 MG 24 hr tablet Take 1 tablet (25 mg total) by mouth daily. 06/28/22   Shirley Friar, PA-C  mirtazapine (REMERON) 7.5 MG tablet Take 1 tablet (7.5 mg total) by mouth at bedtime. For panic/ sleep/ appetite 06/16/22   Ronnie Doss M, DO  polyethylene glycol  powder (GLYCOLAX/MIRALAX) powder Take 1 Container by mouth daily as needed for mild constipation.    [provider]  rosuvastatin (CRESTOR) 10 MG tablet Take 1 tablet (10 mg total) by mouth daily. 06/16/22   [name], DO  spironolactone (ALDACTONE) 25 MG tablet Take 1 tablet (25 mg total) by mouth daily. 07/12/22   Shirley Friar, PA-C  trolamine salicylate (ASPERCREME) 10 % cream Apply 1 application  topically as needed for muscle pain.    [provider]  Zinc Sulfate (ZINC 15 PO) Take by mouth.    [provider]      Allergies    Zoloft [sertraline hcl], Amoxapine and related, Cephalexin, Oxycodone, and Penicillins    Review of Systems   Review of Systems  Physical Exam Updated Vital Signs BP (!) 152/83   Pulse 78   Temp (!) 97 F (36.1 C)   Resp 20   SpO2 100%  Physical Exam Vitals and nursing note reviewed.  Constitutional:      General: She is in acute distress.     Appearance: She is well-developed.     Interventions: She is sedated and intubated.  HENT:     Head: Normocephalic and atraumatic.     Mouth/Throat:     Mouth: Mucous membranes are dry.  Eyes:     General: No scleral icterus.    Conjunctiva/sclera: Conjunctivae normal.  Cardiovascular:     Rate and Rhythm: Tachycardia present. Rhythm irregular.     Heart sounds: Murmur heard.  Pulmonary:     Effort: Respiratory distress present. She is intubated.     Breath sounds: Rhonchi and rales present.  Abdominal:     General: Abdomen is flat. There is no distension.  Musculoskeletal:        General: No swelling.     Cervical back: Neck supple.  Neurological:     Mental Status: She is unresponsive.  Psychiatric:        Mood and Affect: Mood normal.     ED Results / Procedures / Treatments   Labs (all labs ordered are listed, but only abnormal results are displayed) Labs Reviewed  CBC WITH DIFFERENTIAL/PLATELET - Abnormal; Notable for the following  components:      Result Value   WBC 12.3 (*)    Neutro Abs 8.5 (*)    Abs Immature Granulocytes 0.56 (*)    All other components within normal limits  COMPREHENSIVE METABOLIC PANEL - Abnormal; Notable for the following components:   Sodium 130 (*)    Potassium 3.2 (*)    CO2 17 (*)    Glucose, Bld 198 (*)    Creatinine, Ser 1.44 (*)    Calcium 8.0 (*)    Total Protein 6.4 (*)    AST 717 (*)    ALT 627 (*)    GFR, Estimated 38 (*)  All other components within normal limits  I-STAT CHEM 8, ED - Abnormal; Notable for the following components:   Potassium 3.3 (*)    Creatinine, Ser 1.30 (*)    Glucose, Bld 197 (*)    Calcium, Ion 1.04 (*)    TCO2 20 (*)    All other components within normal limits  I-STAT CHEM 8, ED - Abnormal; Notable for the following components:   Potassium 3.2 (*)    Creatinine, Ser 1.30 (*)    Glucose, Bld 196 (*)    Calcium, Ion 1.05 (*)    TCO2 19 (*)    All other components within normal limits  I-STAT ARTERIAL BLOOD GAS, ED - Abnormal; Notable for the following components:   pH, Arterial 7.196 (*)    pCO2 arterial 52.3 (*)    pO2, Arterial 224 (*)    Acid-base deficit 8.0 (*)    All other components within normal limits  TROPONIN I (HIGH SENSITIVITY) - Abnormal; Notable for the following components:   Troponin I (High Sensitivity) 463 (*)    All other components within normal limits  BRAIN NATRIURETIC PEPTIDE  TRIGLYCERIDES  BLOOD GAS, ARTERIAL  TROPONIN I (HIGH SENSITIVITY)    EKG None  Radiology DG Chest Port 1 View  Result Date: 07/12/2022 CLINICAL DATA:  Intubation EXAM: PORTABLE CHEST 1 VIEW COMPARISON:  Chest x-ray dated July 12, 2022 FINDINGS: ETT tip is proximally 2 cm from the carina. Esophageal temperature probe noted. NG/OG tube is partially visualized coursing below the diaphragm. Left chest wall dual lead pacer unchanged lead position. Capped right chest wall leads. Cardiomegaly, similar to prior when accounting for  differences in exam technique. New diffuse interstitial opacities, likely due to pulmonary edema. No large pleural effusion or pneumothorax. New right-sided rib fractures. IMPRESSION: 1. ETT tip is proximally 2 cm from the carina. 2. Diffuse interstitial opacities, likely due to pulmonary edema. 3. New right-sided rib fractures. Electronically Signed   By: Yetta Glassman M.D.   On: 07/12/2022 20:26   DG Chest 2 View  Result Date: 07/12/2022 CLINICAL DATA:  Shortness of breath. EXAM: CHEST - 2 VIEW COMPARISON:  May 27, 2022 FINDINGS: There is stable multi lead AICD wire positioning. The heart size and mediastinal contours are within normal limits. Mild prominence of the perihilar pulmonary vasculature is seen. Mild, diffuse, chronic appearing increased lung markings are seen. Mild atelectasis is noted within the left lung base. There is no evidence of a pleural effusion or pneumothorax. The visualized skeletal structures are unremarkable. IMPRESSION: 1. Mild pulmonary vascular congestion. 2. Mild left basilar atelectasis. Electronically Signed   By: Virgina Norfolk M.D.   On: 07/12/2022 01:10    Procedures Procedure Name: Intubation Date/Time: 07/12/2022 10:53 PM  Performed by: Donzetta Matters, MDPre-anesthesia Checklist: Patient identified and Patient being monitored Oxygen Delivery Method: Ambu bag Preoxygenation: Pre-oxygenation with 100% oxygen Induction Type: Rapid sequence Ventilation: Mask ventilation without difficulty Laryngoscope Size: Glidescope and 3 Grade View: Grade I Tube size: 7.5 mm Number of attempts: 1 Airway Equipment and Method: Video-laryngoscopy Placement Confirmation: ETT inserted through vocal cords under direct vision, Positive ETCO2 and CO2 detector Secured at: 23 cm Tube secured with: ETT holder        Medications Ordered in ED Medications  propofol (DIPRIVAN) 1000 MG/100ML infusion (5 mcg/kg/min  62.1 kg Intravenous New Bag/Given 07/12/22 1943)   etomidate (AMIDATE) injection (20 mg Intravenous Given 07/12/22 1932)  rocuronium (ZEMURON) injection (100 mg Intravenous Given 07/12/22 1932)  norepinephrine (LEVOPHED) '4mg'$   in 259m (0.016 mg/mL) premix infusion (10 mcg/min Intravenous Rate/Dose Change 07/12/22 2127)    ED Course/ Medical Decision Making/ A&P                             Medical Decision Making BGRACELYNN BIRCHERis a 72year old female past medical history as documented above presenting to the emergency department after witnessed arrest.  On arrival to the emergency department patient had KCentracareairway in place with rhonchorous breath sounds bilaterally with difficult bag valve mask ventilation.  Initially patient's blood pressure systolics in the 964Qwith diastolics in the 603K  She was tachycardic to the 1 teens she was saturating 100% with difficult BVM.  Given the difficulty of her airway we opted to intubate the patient.  Prior to intubation patient received 160 mcg of phenylephrine Levophed drip was started at 10 mcg/min.  Please see procedure note for intubation details.  Patient was not a difficult intubation.  Status post intubation patient had further hypotension requiring up titration to 40 mcg/min of norepinephrine initial EKG shows atrial flutter with variable conduction and review of the patient's ST segments that there appears to be no evidence of acute ST segment elevation or depression.  The patient's extensive cardiac history cardiology was consulted.  In consultation with cardiology most likely etiology of patient's arrest was V-fib arrest secondary to worsening heart failure.  Patient's labs show slight elevation in white blood cells of 12.3 hemoglobin 13.5 ANC of 8.5 most likely in the setting of stress response patient CMP shows a sodium of 130, potassium 3.2 glucose 198.  Patient's creatinine is 1.44 with an AST of 717 and ALT of 627 these acute elevations are most likely in the setting of shock.  Patient's initial  gas shows pH of 7.196 with a pCO2 of 52.3.  After initiation of Levophed drip patient's blood pressure normalized.  Ultimately we were able to to down titrate the norepinephrine drip to 10 mcg/min patient remained on minimal ventilation settings.  Intensive care was consulted for primary management with cardiology following patient was admitted to the intensive care unit.   Problems Addressed: Cardiac arrest ([facility]: acute illness or injury that poses a threat to life or bodily functions  Amount and/or Complexity of Data Reviewed Labs: ordered. Radiology: ordered.  Risk Prescription drug management.           Final Clinical Impression(s) / ED Diagnoses Final diagnoses:  None    Rx / DC Orders ED Discharge Orders     None         SDonzetta Matters MD 07/12/22 2257    YDrenda Freeze MD 07/15/22 2030

## 2022-07-12 NOTE — ED Triage Notes (Addendum)
Pt presents from home for SOB from [place].  Was seen by PCP for same but has not received results of testing done there, was told to take 3 days lasix by cardiologist recently. Was scheduled to see provider tomorrow but felt too [facility] to wait  EMS assess: wheezing, 92% RA, improved to 97% with 4L, 150/85, 85bpm.  No meds en route  EMS placed 18G L AC  Has a pacemaker, intermittently paced rhythm with EMS

## 2022-07-12 NOTE — Discharge Instructions (Signed)
Go to your cardiology appointment today.

## 2022-07-12 NOTE — ED Triage Notes (Signed)
Patient BIB [facility] c/o cardiac arrest.  EMS reports that patient had a witnessed arrest, CPR started at approx 1806.  EMS reports they gave 4 epis, patient was defibbed twice, placed on dopamine, and given 100 mcg of fentanyl.  Patient's airway secured with [facility] airway upon arrival.    18 R AC IO L TF IO L hum head

## 2022-07-12 NOTE — Patient Instructions (Signed)
Medication Instructions:  Your physician has recommended you make the following change in your medication:   STOP: Lisinopril/HCTZ START: Spironolactone '25mg'$  daily START: Losartan '25mg'$  daily Take Lasix (furosemide) '20mg'$  on Monday and Friday then take as needed  *If you need a refill on your cardiac medications before your next appointment, please call your pharmacy*   Lab Work: None If you have labs (blood work) drawn today and your tests are completely normal, you will receive your results only by: [place] (if you have MyChart) OR A paper copy in the mail If you have any lab test that is abnormal or we need to change your treatment, we will call you to review the results.   Testing/Procedures: Your physician has requested that you have a cardiac catheterization. Cardiac catheterization is used to diagnose and/or treat various heart conditions. Doctors may recommend this procedure for a number of different reasons. The most common reason is to evaluate chest pain. Chest pain can be a symptom of coronary artery disease (CAD), and cardiac catheterization can show whether plaque is narrowing or blocking your heart's arteries. This procedure is also used to evaluate the valves, as well as measure the blood flow and oxygen levels in different parts of your heart. For further information please visit [email or web address]. Please follow instruction sheet, as given.  Your physician has requested that you have a TEE. During a TEE, sound waves are used to create images of your heart. It provides your doctor with information about the size and shape of your heart and how well your heart's chambers and valves are working. In this test, a transducer is attached to the end of a flexible tube that's guided down your throat and into your esophagus (the tube leading from you mouth to your stomach) to get a more detailed image of your heart. You are not awake for the procedure. Please see the instruction  sheet given to you today. For further information please visit [email or web address].   Follow-Up: At [facility], you and your health needs are our priority.  As part of our continuing mission to provide you with exceptional heart care, we have created designated Provider Care Teams.  These Care Teams include your primary Cardiologist (physician) and Advanced Practice Providers (APPs -  Physician Assistants and Nurse Practitioners) who all work together to provide you with the care you need, when you need it.  We recommend signing up for the patient portal called "MyChart".  Sign up information is provided on this After Visit Summary.  MyChart is used to connect with patients for Virtual Visits (Telemedicine).  Patients are able to view lab/test results, encounter notes, upcoming appointments, etc.  Non-urgent messages can be sent to your provider as well.   To learn more about what you can do with MyChart, go to [email or web address].    Your next appointment:   4 week(s)  Provider:   You will see one of the following Advanced Practice Providers on your designated Care Team:   Legrand Como "Jonni Sanger" Spr[place], [place]

## 2022-07-12 NOTE — Progress Notes (Signed)
Electrophysiology Office Note Date: 07/12/2022  ID:  Carmen Green, Carmen Green 12-27-1950, MRN [id number]  PCP: [name], DO Primary Cardiologist: None Electrophysiologist: [name], MD   CC: Pacemaker follow-up  Carmen Green is a 72 y.o. female seen today for [name], MD for routine electrophysiology followup.   At last visit pt reported with SOB and fatigue. Found now to have severe MR on TTE.   Pt seen in ED overnight for acute SOB. Given IV lasix with rapid improvement. Taking lasix "as needed" at home but she wasn't really sure how to navigate this.   Remains fatigued and SOB with moderate or more exertion in general. Denies chest pain. No orthopnea. Denies dependent edema.   Device History: [employer], [employer] PPM implanted 1992 for CHB, gen change 2004, gen change 2012, Abandoned R side device and L sided device implant 01/2020    Current Outpatient Medications  Medication Instructions   buPROPion (WELLBUTRIN XL) 300 mg, Oral, Daily   busPIRone (BUSPAR) 15 MG tablet TAKE 1 TABLET 3 TIMES A DAY   cholecalciferol (VITAMIN D3) 1,000 Units, Oral, Daily   D-MANNOSE PO 1 tablet, Oral, Daily   esomeprazole (NEXIUM) 40 MG capsule TAKE ONE CAPSULE DAILY AT NOON   estradiol (ESTRACE) 0.1 MG/GM vaginal cream INSERT 1 GRAM VAGINALLY AT BEDTIME FOR 2 WEEKS, THEN TWICE A WEEK THEREAFTER   fluticasone (FLONASE) 50 MCG/ACT nasal spray 1 SPRAY IN EACH NOSTRIL DAILY AS NEEDED FOR ALLERGIES   furosemide (LASIX) 20 MG tablet Take 1 tablet by mouth daily for 3 days then take as needed.   guaiFENesin (MUCINEX) 600 mg, Oral, 2 times daily PRN   levocetirizine (XYZAL) 5 mg, Oral, Every evening   lisinopril-hydrochlorothiazide (ZESTORETIC) 20-12.5 MG tablet TAKE 1 TABLET EVERY MORNING   LORazepam (ATIVAN) 1 MG tablet Take 1 tablet every night at bedtime.  May take 1/2 tablet during daytime if needed.   meclizine (ANTIVERT) 25 mg, Oral, 3 times daily PRN    medroxyPROGESTERone (PROVERA) 2.5 mg, Oral, Daily   methenamine (HIPREX) 1 g, Oral, 2 times daily   metoprolol succinate (TOPROL XL) 25 mg, Oral, Daily   mirtazapine (REMERON) 7.5 mg, Oral, Daily at bedtime, For panic/ sleep/ appetite   polyethylene glycol powder (GLYCOLAX/MIRALAX) powder 1 Container, Oral, Daily PRN   rosuvastatin (CRESTOR) 10 mg, Oral, Daily   trolamine salicylate (ASPERCREME) 10 % cream 1 application , Topical, As needed   vitamin C 1,000 mg, Oral, Daily   Zinc Sulfate (ZINC 15 PO) Oral    Physical Exam: Vitals:   07/12/22 0848  BP: 120/66  Pulse: 60  SpO2: 97%  Weight: 137 lb (62.1 kg)  Height: '5\' 1"'$  (1.549 m)     GEN- NAD. A&O x 3. Normal affect HEENT: Normocephalic, atraumatic Lungs- CTAB, Normal effort.  Heart- Regular rate and rhythm rate and rhythm. No M/G/R.  Extremities- No peripheral edema. no clubbing or cyanosis Skin- warm and dry, no rash or lesion, PPM pocket well healed.  PPM Interrogation-  Not checked today.   EKG is not ordered today  Other studies Reviewed: Additional studies/ records that were reviewed today include: Previous EP office notes, Previous remote checks, Most recent labwork.   ECHO COMPLETE WITH IMAGING ENHANCING AGENT 07/06/2022  Narrative ECHOCARDIOGRAM REPORT    Patient Name:   Carmen Green Date of Exam: 07/06/2022 Medical Rec #:  [id number]        Height:  61.0 in Accession #:    [id number]       Weight:       140.2 lb Date of Birth:  22-Jan-1951        BSA:          1.624 m Patient Age:    88 years         BP:           132/80 mmHg Patient Gender: F                HR:           68 bpm. Exam Location:  [place]  Procedure: 2D Echo, Cardiac Doppler, Color Doppler and Intracardiac Opacification Agent  Indications:    R06.00 Dyspnea  History:        Patient has prior history of Echocardiogram examinations, most recent 08/17/2019. Pacemaker, Mitral Valve Prolapse, Arrythmias:Complete Heart Block,  Signs/Symptoms:Dyspnea and Fatigue; Risk Factors:Family History of Coronary Artery Disease, Hypertension and Dyslipidemia.  Sonographer:    Deliah Boston RDCS Referring Phys: Shirley Friar  IMPRESSIONS   1. Left ventricular ejection fraction, by estimation, is 25 to 30%. The left ventricle has severely decreased function. The left ventricle demonstrates global hypokinesis. The left ventricular internal cavity size was mildly dilated. There is mild asymmetric left ventricular hypertrophy of the basal-septal segment. Left ventricular diastolic parameters are consistent with Grade II diastolic dysfunction (pseudonormalization). 2. Right ventricular systolic function is mildly reduced. The right ventricular size is normal. There is moderately elevated pulmonary artery systolic pressure. The estimated right ventricular systolic pressure is 41.9 mmHg. 3. Left atrial size was severely dilated. 4. The mitral valve is abnormal, the valve is calcified and posterior leaflet appears restricted. Severe mitral valve regurgitation. No evidence of mitral stenosis. 5. Tricuspid valve regurgitation is mild to moderate. 6. The aortic valve is tricuspid. Aortic valve regurgitation is not visualized. No aortic stenosis is present. 7. The inferior vena cava is normal in size with greater than 50% respiratory variability, suggesting right atrial pressure of 3 mmHg. 8. Function appears significant worse than prior echo, consider CHF clinic referral.  Assessment and Plan:  1. CHB s/p [employer] PPM (R side abandoned, L sided implant 01/2020) Normal PPM function at last visit.   2. Acute on chronic systolic CHF Echo shows EF newly down to 25-30% Continue toprol 25 mg daily. Stop Lisinopril/HCTz Start Losartan 25 mg daily. Plan eventual shift to [facility] Spironolactone 25 mg daily  Consider SGLT2i as well Labs in ED reviewed. Will get recheck labs in setting of TEE / L/RHC  3. Severe MR By  echo, new diagnosis.  Plan for TEE and L/RHC.  Will also place referral to Dr. Lavonna Monarch to see post work up.   Personally discussed work up recommendations with Dr. Audie Box by phone.   Labs/ tests ordered today include:  Orders Placed This Encounter  Procedures   Ambulatory referral to Cardiothoracic Surgery   Disposition:   Follow up with EP APP in 4 weeks. Sooner pending on going work up.  Jacalyn Lefevre, PA-C  07/12/2022 9:39 AM  [facility] [address] [place] [address] [phone] (office) [phone] (fax)

## 2022-07-12 NOTE — ED Provider Triage Note (Signed)
Emergency Medicine Provider Triage Evaluation Note  Carmen Green , a 72 y.o. female  was evaluated in triage.  Pt complains of shortness of breath.  Patient has a pacemaker due to having a complete heart block and also has mitral valve prolapse. patient states this began at 2100 and been constant.  Patient was recently put on Lasix by her cardiologist and told to take the medications for 3 days and then as needed.  Patient has been taking her Lasix as needed once after the first 3 days.  Patient required oxygen by EMS and is on 4 L of oxygen.  Patient denied nausea/vomiting, recent illness, sick contacts, productive cough  Review of Systems  Positive: See HPI Negative: See HPI  Physical Exam  BP 137/82 (BP Location: Left Arm)   Pulse 89   Temp 98.5 F (36.9 C) (Oral)   Resp 19   Wt 63 kg   SpO2 94%   BMI 26.24 kg/m  Gen:   Awake, no distress   Resp:  Normal effort, no crackles noted MSK:   1+ bilateral pitting edema CV:  RRR  Medical Decision Making  Medically screening exam initiated at 12:44 AM.  Appropriate orders placed.  Carmen Green was informed that the remainder of the evaluation will be completed by another provider, this initial triage assessment does not replace that evaluation, and the importance of remaining in the ED until their evaluation is complete.   Chuck Hint, PA-C 07/12/22 [phone]

## 2022-07-13 ENCOUNTER — Inpatient Hospital Stay (HOSPITAL_COMMUNITY): Payer: Medicare Other

## 2022-07-13 ENCOUNTER — Inpatient Hospital Stay: Payer: Self-pay

## 2022-07-13 DIAGNOSIS — I469 Cardiac arrest, cause unspecified: Secondary | ICD-10-CM

## 2022-07-13 DIAGNOSIS — R4182 Altered mental status, unspecified: Secondary | ICD-10-CM | POA: Diagnosis not present

## 2022-07-13 LAB — GLUCOSE, CAPILLARY: Glucose-Capillary: 164 mg/dL — ABNORMAL HIGH (ref 70–99)

## 2022-07-13 LAB — CBG MONITORING, ED
Glucose-Capillary: 116 mg/dL — ABNORMAL HIGH (ref 70–99)
Glucose-Capillary: 101 mg/dL — ABNORMAL HIGH (ref 70–99)
Glucose-Capillary: 94 mg/dL (ref 70–99)

## 2022-07-13 LAB — CBC
HCT: 38.2 % (ref 36.0–46.0)
HCT: 39.2 % (ref 36.0–46.0)
Hemoglobin: 13.1 g/dL (ref 12.0–15.0)
Hemoglobin: 13.6 g/dL (ref 12.0–15.0)
MCH: 33 pg (ref 26.0–34.0)
MCH: 32.9 pg (ref 26.0–34.0)
MCHC: 34.3 g/dL (ref 30.0–36.0)
MCHC: 34.7 g/dL (ref 30.0–36.0)
MCV: 96.2 fL (ref 80.0–100.0)
MCV: 94.9 fL (ref 80.0–100.0)
Platelets: 168 10*3/uL (ref 150–400)
Platelets: 155 10*3/uL (ref 150–400)
RBC: 3.97 MIL/uL (ref 3.87–5.11)
RBC: 4.13 MIL/uL (ref 3.87–5.11)
RDW: 11.8 % (ref 11.5–15.5)
RDW: 11.9 % (ref 11.5–15.5)
WBC: 12.9 10*3/uL — ABNORMAL HIGH (ref 4.0–10.5)
WBC: 14.8 10*3/uL — ABNORMAL HIGH (ref 4.0–10.5)
nRBC: 0 % (ref 0.0–0.2)
nRBC: 0 % (ref 0.0–0.2)

## 2022-07-13 LAB — BASIC METABOLIC PANEL
Anion gap: 14 (ref 5–15)
Anion gap: 7 (ref 5–15)
BUN: 22 mg/dL (ref 8–23)
BUN: 18 mg/dL (ref 8–23)
CO2: 19 mmol/L — ABNORMAL LOW (ref 22–32)
CO2: 24 mmol/L (ref 22–32)
Calcium: 8.6 mg/dL — ABNORMAL LOW (ref 8.9–10.3)
Calcium: 8.2 mg/dL — ABNORMAL LOW (ref 8.9–10.3)
Chloride: 100 mmol/L (ref 98–111)
Chloride: 100 mmol/L (ref 98–111)
Creatinine, Ser: 1.32 mg/dL — ABNORMAL HIGH (ref 0.44–1.00)
Creatinine, Ser: 0.99 mg/dL (ref 0.44–1.00)
GFR, Estimated: 43 mL/min — ABNORMAL LOW (ref >60)
GFR, Estimated: >60 mL/min (ref >60)
Glucose, Bld: 109 mg/dL — ABNORMAL HIGH (ref 70–99)
Glucose, Bld: 150 mg/dL — ABNORMAL HIGH (ref 70–99)
Potassium: 4.6 mmol/L (ref 3.5–5.1)
Potassium: 3.3 mmol/L — ABNORMAL LOW (ref 3.5–5.1)
Sodium: 133 mmol/L — ABNORMAL LOW (ref 135–145)
Sodium: 131 mmol/L — ABNORMAL LOW (ref 135–145)

## 2022-07-13 LAB — TROPONIN I (HIGH SENSITIVITY): Troponin I (High Sensitivity): 830 ng/L (ref <18)

## 2022-07-13 LAB — HEPATIC FUNCTION PANEL
ALT: 609 U/L — ABNORMAL HIGH (ref 0–44)
AST: 620 U/L — ABNORMAL HIGH (ref 15–41)
Albumin: 4.3 g/dL (ref 3.5–5.0)
Alkaline Phosphatase: 77 U/L (ref 38–126)
Bilirubin, Direct: 0.4 mg/dL — ABNORMAL HIGH (ref 0.0–0.2)
Indirect Bilirubin: 0.3 mg/dL (ref 0.3–0.9)
Total Bilirubin: 0.7 mg/dL (ref 0.3–1.2)
Total Protein: 7.1 g/dL (ref 6.5–8.1)

## 2022-07-13 LAB — COOXEMETRY PANEL
Carboxyhemoglobin: 1.3 % (ref 0.5–1.5)
Methemoglobin: <0.7 % (ref 0.0–1.5)
O2 Saturation: 64.2 %
Total hemoglobin: 13.7 g/dL (ref 12.0–16.0)

## 2022-07-13 LAB — MRSA NEXT GEN BY PCR, NASAL: MRSA by PCR Next Gen: NOT DETECTED

## 2022-07-13 LAB — PHOSPHORUS: Phosphorus: 4.3 mg/dL (ref 2.5–4.6)

## 2022-07-13 LAB — ECHOCARDIOGRAM COMPLETE
Calc EF: 19.5 %
Est EF: <20
Height: 61 in
MV M vel: 4.74 m/s
MV Peak grad: 89.9 mmHg
Radius: 0.4 cm
S' Lateral: 4 cm
Single Plane A2C EF: 14.4 %
Single Plane A4C EF: 24.7 %
Weight: 2190.49 oz

## 2022-07-13 LAB — MAGNESIUM
Magnesium: 1.9 mg/dL (ref 1.7–2.4)
Magnesium: 1.8 mg/dL (ref 1.7–2.4)

## 2022-07-13 LAB — LACTIC ACID, PLASMA
Lactic Acid, Venous: 2.6 mmol/L (ref 0.5–1.9)
Lactic Acid, Venous: 4.3 mmol/L (ref 0.5–1.9)

## 2022-07-13 LAB — HEPARIN LEVEL (UNFRACTIONATED)
Heparin Unfractionated: 0.23 IU/mL — ABNORMAL LOW (ref 0.30–0.70)
Heparin Unfractionated: 0.18 IU/mL — ABNORMAL LOW (ref 0.30–0.70)

## 2022-07-13 LAB — TRIGLYCERIDES
Triglycerides: 817 mg/dL — ABNORMAL HIGH (ref <150)
Triglycerides: 78 mg/dL (ref <150)

## 2022-07-13 MED ORDER — SODIUM CHLORIDE 0.9 % IV SOLN: INTRAVENOUS | Status: DC | PRN

## 2022-07-13 MED ORDER — AMIODARONE LOAD VIA INFUSION
150.0000 mg | Freq: Once | INTRAVENOUS | Status: AC
  Administered 2022-07-13: 150 mg via INTRAVENOUS
  Filled 2022-07-13: qty 83.34

## 2022-07-13 MED ORDER — PERFLUTREN LIPID MICROSPHERE
1.0000 mL | INTRAVENOUS | Status: AC | PRN
  Administered 2022-07-13: 4.5 mL via INTRAVENOUS

## 2022-07-13 MED ORDER — POTASSIUM CHLORIDE 20 MEQ PO PACK
20.0000 meq | PACK | ORAL | Status: AC
  Administered 2022-07-13 – 2022-07-14 (×2): 20 meq
  Filled 2022-07-13 (×2): qty 1

## 2022-07-13 MED ORDER — NOREPINEPHRINE 4 MG/250ML-% IV SOLN
0.0000 ug/min | INTRAVENOUS | Status: DC
  Administered 2022-07-13: 8 ug/min via INTRAVENOUS
  Administered 2022-07-14: 18 ug/min via INTRAVENOUS
  Filled 2022-07-13 (×2): qty 250

## 2022-07-13 MED ORDER — SODIUM CHLORIDE 0.9 % IV BOLUS
250.0000 mL | Freq: Once | INTRAVENOUS | Status: AC
  Administered 2022-07-13: 250 mL via INTRAVENOUS

## 2022-07-13 MED ORDER — POTASSIUM CHLORIDE 10 MEQ/50ML IV SOLN
10.0000 meq | INTRAVENOUS | Status: AC
  Administered 2022-07-13 – 2022-07-14 (×4): 10 meq via INTRAVENOUS
  Filled 2022-07-13 (×4): qty 50

## 2022-07-13 MED ORDER — AMIODARONE HCL IN DEXTROSE 360-4.14 MG/200ML-% IV SOLN
60.0000 mg/h | INTRAVENOUS | Status: AC
  Administered 2022-07-13: 60 mg/h via INTRAVENOUS
  Filled 2022-07-13 (×3): qty 200

## 2022-07-13 MED ORDER — ORAL CARE MOUTH RINSE
15.0000 mL | OROMUCOSAL | Status: DC
  Administered 2022-07-14 – 2022-07-21 (×92): 15 mL via OROMUCOSAL

## 2022-07-13 MED ORDER — ORAL CARE MOUTH RINSE: 15.0000 mL | OROMUCOSAL | Status: DC | PRN

## 2022-07-13 MED ORDER — AMIODARONE HCL IN DEXTROSE 360-4.14 MG/200ML-% IV SOLN
30.0000 mg/h | INTRAVENOUS | Status: DC
  Administered 2022-07-13 – 2022-07-22 (×18): 30 mg/h via INTRAVENOUS
  Filled 2022-07-13 (×18): qty 200

## 2022-07-13 MED ORDER — ASPIRIN 325 MG PO TBEC: 325.0000 mg | DELAYED_RELEASE_TABLET | Freq: Once | ORAL | Status: DC

## 2022-07-13 MED ORDER — CHLORHEXIDINE GLUCONATE CLOTH 2 % EX PADS
6.0000 | MEDICATED_PAD | Freq: Every day | CUTANEOUS | Status: DC
  Administered 2022-07-13 – 2022-07-23 (×11): 6 via TOPICAL

## 2022-07-13 MED ORDER — HEPARIN BOLUS VIA INFUSION
1500.0000 [IU] | Freq: Once | INTRAVENOUS | Status: AC
  Administered 2022-07-13: 1500 [IU] via INTRAVENOUS
  Filled 2022-07-13: qty 1500

## 2022-07-13 MED ORDER — ASPIRIN 81 MG PO CHEW
324.0000 mg | CHEWABLE_TABLET | Freq: Every day | ORAL | Status: DC
  Administered 2022-07-13: 324 mg
  Filled 2022-07-13: qty 4

## 2022-07-13 MED ORDER — MAGNESIUM SULFATE 4 GM/100ML IV SOLN
4.0000 g | Freq: Once | INTRAVENOUS | Status: AC
  Administered 2022-07-13: 4 g via INTRAVENOUS
  Filled 2022-07-13: qty 100

## 2022-07-13 MED ORDER — ASPIRIN 81 MG PO TBEC: 81.0000 mg | DELAYED_RELEASE_TABLET | Freq: Every day | ORAL | Status: DC

## 2022-07-13 MED ORDER — ASPIRIN 81 MG PO CHEW
81.0000 mg | CHEWABLE_TABLET | Freq: Every day | ORAL | Status: DC
  Administered 2022-07-14 – 2022-07-23 (×10): 81 mg
  Filled 2022-07-13 (×10): qty 1

## 2022-07-13 MED ORDER — MILRINONE LACTATE IN DEXTROSE 20-5 MG/100ML-% IV SOLN
0.2500 ug/kg/min | INTRAVENOUS | Status: DC
  Filled 2022-07-13: qty 100

## 2022-07-13 NOTE — Progress Notes (Addendum)
NAME:  Carmen Green, MRN:  [id number], DOB:  1950/12/29, LOS: 1 ADMISSION DATE:  07/12/2022, CONSULTATION DATE: 1/22 REFERRING MD: Dr. Darl Householder EDP, CHIEF COMPLAINT: Cardiac arrest  History of Present Illness:  72 year old female with past medical history as below, which is significant for complete heart block status post pacemaker, mitral valve prolapse, GERD, and esophagitis.  She is also recently been undergoing additional cardiac workup after presenting to the cardiology office on 1/8 with new complaints of shortness of breath and fatigue.  Echocardiogram done 1/16 demonstrated newly reduced LVEF to 25% with severe mitral regurgitation.  There were plans for her to undergo TEE and left/right heart cath, however, she unfortunately presented to [facility] emergency department on 1/22 after suffering cardiac arrest at home at approximately 1830.  Bystanders report she fell to the ground.  Sounds like she did receive bystander CPR and first responders placed AED which did not advise shock.  After EMS arrived and placed cardiac monitor she was in PEA.  Rhythm did eventually changed to V-fib at which point she was defibrillated and ROSC was achieved.  King airway was exchanged for endotracheal tube in the emergency department.  Patient was hypotensive requiring norepinephrine infusion.  EKG with nonspecific possibly ischemic changes.  Cardiology was consulted by the EDP.  PCCM has been asked to admit.  Pertinent  Medical History   has a past medical history of Allergy, Anemia, Anxiety, Arthritis, Cataract, Colon polyp, Complete heart block ([place]), Contusion of chest wall (10/10/2007), Depression, Diverticulitis, Esophagitis, ESOPHAGITIS (06/25/2008), GERD (gastroesophageal reflux disease), H/O seasonal allergies, Heart murmur, Hiatal hernia, Hyperlipidemia, Hypertension, Mitral valve prolapse, and Pacemaker.   Significant Hospital Events: Including procedures, antibiotic start and stop dates in addition to  other pertinent events   Admission following cardiac arrest  Interim History / Subjective:  Stable vasopressors support  Objective   Blood pressure 110/72, pulse 66, temperature 100.3 F (37.9 C), resp. rate (!) 35, height '5\' 1"'$  (1.549 m), weight 62.1 kg, SpO2 100 %.    Vent Mode: PRVC FiO2 (%):  [50 %-100 %] 50 % Set Rate:  [16 bmp-22 bmp] 22 bmp Vt Set:  [380 mL] 380 mL PEEP:  [5 cmH20] 5 cmH20 Plateau Pressure:  [15 PPI95-18 cmH20] 21 cmH20   Intake/Output Summary (Last 24 hours) at 07/13/2022 8416 Last data filed at 07/13/2022 0023 Gross per 24 hour  Intake 100 ml  Output --  Net 100 ml   Filed Weights   07/12/22 2310  Weight: 62.1 kg    Examination: General: Elderly female currently sedated HEENT: MM pink/moist endotracheal tube was placed Neuro: Currently sedated CV: Heart sounds regular intermittently PULM: Diminished in the bases Vent pressure regulated volume control FIO2 50% PEEP RATE 16 VT 380  GI: soft, bsx4 active  GU: Amber urine Extremities: warm/dry,  edema  Skin: no rashes or lesions   Resolved Hospital Problem list     Assessment & Plan:   Cardiac arrest: Suspect in setting that was cardiac in nature given recent history and sudden collapse.  EKG in the ED with potential signs of ischemia, however, in the immediate postarrest setting the significance is uncertain.  CT of the head EEG TTM Heparin drip for ischemia flutter Monitor troponin    Acute hypoxemic respiratory failure: pulmonary edema - Improved with positive pressure - Full vent support - ABG reviewed and settings adjusted.  - Propofol and PRN fentanyl for RASS goal 0 to -1.  - Daily assessment for SAT/SBT  Acute on  chronic HFrEF Cardiogenic shock Atrial flutter Cardiology has been consulted CVL will be placed Vasopressor support as needed Trend lactic acid    R rib fx: s/p CPR Monitor  AKI Hypokalemia Recent Labs  Lab 07/12/22 1959 07/12/22 2116  07/12/22 2356  K 3.2* 3.7 5.3*     Best Practice (right click and "Reselect all SmartList Selections" daily)   Diet/type: NPO DVT prophylaxis: systemic heparin GI prophylaxis: H2B Lines: N/A Foley:  Yes, and it is still needed Code Status:  DNR Last date of multidisciplinary goals of care discussion [Discussed [place] with husband. He describes a living will, which indicates DNR, but OK with ongoing critical care for now.]  Labs   CBC: Recent Labs  Lab 07/12/22 0057 07/12/22 1945 07/12/22 1951 07/12/22 1959 07/12/22 2116 07/12/22 2356 07/13/22 0610  WBC 7.9 12.3*  --   --   --   --  12.9*  NEUTROABS 6.4 8.5*  --   --   --   --   --   HGB 12.9 13.5 13.6 13.6 13.6 13.3 13.1  HCT 36.9 40.4 40.0 40.0 40.0 39.0 38.2  MCV 94.6 99.3  --   --   --   --  96.2  PLT 201 177  --   --   --   --  505    Basic Metabolic Panel: Recent Labs  Lab 07/12/22 0057 07/12/22 1945 07/12/22 1951 07/12/22 1959 07/12/22 2116 07/12/22 2356  NA 127* 130* 135 135 135 129*  K 3.7 3.2* 3.3* 3.2* 3.7 5.3*  CL 96* 100 100 100  --   --   CO2 22 17*  --   --   --   --   GLUCOSE 173* 198* 197* 196*  --   --   BUN '11 16 19 18  '$ --   --   CREATININE 0.95 1.44* 1.30* 1.30*  --   --   CALCIUM 9.0 8.0*  --   --   --   --    GFR: Estimated Creatinine Clearance: 32.6 mL/min (A) (by C-G formula based on SCr of 1.3 mg/dL (H)). Recent Labs  Lab 07/12/22 0057 07/12/22 1945 07/13/22 0610  WBC 7.9 12.3* 12.9*  LATICACIDVEN  --   --  2.6*    Liver Function Tests: Recent Labs  Lab 07/12/22 0057 07/12/22 1945  AST 26 717*  ALT 16 627*  ALKPHOS 53 77  BILITOT 0.6 0.9  PROT 6.7 6.4*  ALBUMIN 3.9 3.7   No results for input(s): "LIPASE", "AMYLASE" in the last 168 hours. No results for input(s): "AMMONIA" in the last 168 hours.  ABG    Component Value Date/Time   PHART 7.264 (L) 07/12/2022 2356   PCO2ART 40.5 07/12/2022 2356   PO2ART 100 07/12/2022 2356   HCO3 18.3 (L) 07/12/2022 2356   TCO2 20  (L) 07/12/2022 2356   ACIDBASEDEF 8.0 (H) 07/12/2022 2356   O2SAT 97 07/12/2022 2356     Coagulation Profile: No results for input(s): "INR", "PROTIME" in the last 168 hours.  Cardiac Enzymes: No results for input(s): "CKTOTAL", "CKMB", "CKMBINDEX", "TROPONINI" in the last 168 hours.  HbA1C: No results found for: "HGBA1C"  CBG: Recent Labs  Lab 07/13/22 0530 07/13/22 0751  GLUCAP 116* 101*     Critical care time: 42 minutes      Richardson Landry Minor ACNP Acute Care Nurse Practitioner [place] Please consult Amion 07/13/2022, 8:43 AM   Critical care attending attestation note:  Patient seen and  examined and relevant ancillary tests reviewed.  I agree with the assessment and plan of care as outlined by minor NP.   72 year old woman history of pacemaker placement presented to the ED with shortness of breath and found to be overloaded 1/21.  She got Lasix.  Improved.  Was seen in cardiology clinic 1/22.  Echocardiogram 1/16 revealed new reduced EF.  Unfortunate, the evening of 06/2020 she experienced cardiac arrest, reported V-fib.  2 shocks.  CPR.  ROSC was obtained.  In interim, she has had hypotension.  On low-dose norepinephrine.  Initially on milrinone but this was weaned off.  When propofol was turned off she is nonpurposeful but moves.  CT head negative.  She continues a lot of ectopy.  Runs of V. tach.  Appreciate EP and heart failure service assistance.  Centerline was placed when she arrived to the ICU for monitoring of co-oximetry, CVP's etc.  Synopsis of assessment and plan:  Wide-complex tachycardia, reported V-fib cardiac arrest: Suspect related to structural heart disease, cardiomyopathy.  With ongoing runs of wide-complex tachycardia. -- Appreciate EP and heart failure assistance.  Continue Amio, magnesium given, additional recommendations per cardiology -- Pacer settings increased -- Hold Lasix for now, no fluids, get more information now with  central line placed -- Continue normothermia protocol, Arctic sun in place  Decompensated heart failure: Not had a cath.  Presumed possibly to chronic RV pacing. -- Appreciate cardiology assistance -- Cooximetry, CVP ordered  Oxygen respiratory failure: In setting of PEA arrest. -- PRVC, twice daily bundle, VAP bundle, also prophylaxis  Elevated LFTs: Suspect congestion given recent evidence of volume overload as well as reduced EF. -- Continue to trend, avoid hepatotoxins  Acute kidney injury: Baseline GFR greater than 60.  Suspect combination of congestion, ATN in setting of hypotension. -- Blood pressure support, vasopressors -- Avoid nephrotoxins  Shock: Suspect cardiogenic in nature. -- Norepinephrine, MAP program 65 -- Co. oximetry and CVP as above   CRITICAL CARE Performed by: Lanier Clam   Total critical care time: 45 minutes  Critical care time was exclusive of separately billable procedures and treating other patients.  Critical care was necessary to treat or prevent imminent or life-threatening deterioration.  Critical care was time spent personally by me on the following activities: development of treatment plan with patient and/or surrogate as well as nursing, discussions with consultants, evaluation of patient's response to treatment, examination of patient, obtaining history from patient or surrogate, ordering and performing treatments and interventions, ordering and review of laboratory studies, ordering and review of radiographic studies, pulse oximetry, re-evaluation of patient's condition and participation in multidisciplinary rounds.  Larey Days, MD  ICU Physician [place] and Pulmonary Medicine  See Amion for pager info  07/13/2022, 4:27 PM

## 2022-07-13 NOTE — Consult Note (Signed)
Cardiology Consultation   Patient ID: WYLEE OGDEN MRN: [id number]; DOB: [birth year]  Admit date: 07/12/2022 Date of Consult: 07/13/2022  PCP:  [name], [place] Prov[facility] Cardiologist:  None  Electrophysiologist: Dr. Rayann Heman >>  [name], MD (not yet seen by him)   Patient Profile:   Carmen Green is a 71 y.o. female with a hx of CHB w/PPM, PVCs, HTN, MR who is being seen 07/13/2022 for the evaluation of cardiac arrest at the request of Dr. Daniel Nones.  Device information SJM dual chamber PPM implanted 1992 for CHB, gen change 2004, gen change 2012, this system was abandoned on the R side  >>> new  L PPM device implant 01/2020    History of Present Illness:   Carmen Green was previously followed by Dr. Rayann Heman, known CHB/pacer dependent and PVCs.  She saw A. Tillery, PA-C 06/28/22 with c/o progressive malaise, SOB, onset a few weeks, seemed to correlate with an antibiotic she tool for a UTI, no c/o CP.  [place] on exam and symptoms prompted an updated echo, pacer functioning normally noted some AT episodes and Toprol was added.  TTE noted a new reduction in LVEF  ER visit 07/12/21 early AM for an acute increase in SOB, initial O2 sats 92 on RA and placed on O2, espiratory panel negative NA 127 suspect dilutional, HS Trops were OK, BNP 547. She was given IV lasix felt better and discharged from the ER  She saw A. Tillery, PA-C same day, her ACE changed to losartan, started on spironolactone. LVEF 25-30% and severe MR, discussed case with clinical cardiologist planned for TEE as well as R/LHC  Last night she collapsed at home,  EMS report reviewed: BLS arrived 1st bystander CPR in progress No pulse on their arrival Another crew arrived and placed [facility] airway Record reports +  AED defibrillation by BLS crew > no pulse BLS continued [place] CPR applied IO achieved PEA rhythm mentioned CPR continued Epi And IVF Manual  defibrillation Lidocaine '60mg'$  given IO for VF ROSC Intermittently reported loss of pulse To more rounds of epi with return of pulse Fentanyl for sedation dopamine  Only tracing available are SR/likely V paced  She has subsequently been converted to DNR status here  HF team and CCM on board, EP was asked to the case as well with V ectopy and some NSVT runs to consider increasing her base pacing rate to suppress her ectopy   LABS NA 130 K+ 3.2 > 3.3 > 3.2 >  3.7 > 5.3 > 4,6 Mag 1.9 BUN/Creat 16/1.44 > 1.30 > 1.30 > 1.32 AST 717 > 620 ALT 627 > 609 WBC 12.3 > 12.9 H/h 13/40 Plts 177  HSTrop 463 > 753 > 830  (prior were 18 and 23)   Lactic acid 2.6 > 4.3  The patient is intubated and sedated  Past Medical History:  Diagnosis Date   Allergy    Anemia    Anxiety    Arthritis    Cataract    bil cataracts removed   Colon polyp    Complete heart block (HCC)    Contusion of chest wall 10/10/2007   Centricity Description: CHEST WALL CONTUSION Qualifier: Diagnosis of  By: Aline Brochure MD, Dorothyann Peng   Centricity Description: CONTUSION, RIGHT RIB Qualifier: Diagnosis of  By: Aline Brochure MD, Stanley     Depression    Diverticulitis    Esophagitis    ESOPHAGITIS 06/25/2008   Qualifier: History of  By: Harlon Ditty CMA (AAMA), Dottie     GERD (gastroesophageal reflux disease)    H/O seasonal allergies    Heart murmur    Hiatal hernia    Hyperlipidemia    s/p PPM implant by Dr Olevia Perches 1992 with generator change 2004 (SJM), she is device dependant   Hypertension    Mitral valve prolapse    Pacemaker     Past Surgical History:  Procedure Laterality Date   APPENDECTOMY     BREAST EXCISIONAL BIOPSY Right 07/08/1998   BREAST SURGERY Right    biopsy   COLONOSCOPY  10/19/2004   [place]   EYE SURGERY Left    cataract extraction with IOL   LAPAROSCOPIC LOW ANTERIOR RESECTION  11/16/2013   Robotic   LAPAROSCOPIC LYSIS OF ADHESIONS N/A 11/15/2013   Procedure: LAPAROSCOPIC LYSIS OF ADHESIONS;   Surgeon: Adin Hector, MD;  Location: [facility];  Service: General;  Laterality: N/A;   left ovary cyst removal  [place] 01/22/2020   Procedure: PACEMAKER IMPLANT;  Surgeon: Thompson Grayer, MD;  Location: [facility];  Service: Cardiovascular;  Laterality: N/A;   [place], 2004, 2012   device dependant   POLYPECTOMY     PROCTOSCOPY  11/15/2013   Procedure: RIGID PROCTOSCOPY;  Surgeon: Adin Hector, MD;  Location: [facility];  Service: General;;   ROBOTIC ASSISTED SALPINGO OOPHERECTOMY Left 11/16/2013   Robotic en bloc w LAR resection   TUBAL LIGATION     UPPER GASTROINTESTINAL ENDOSCOPY       Home Medications:  Prior to Admission medications   Medication Sig Start Date End Date Taking? Authorizing Provider  Ascorbic Acid (VITAMIN C) 1000 MG tablet Take 1,000 mg by mouth daily.    [provider]  buPROPion (WELLBUTRIN XL) 300 MG 24 hr tablet Take 1 tablet (300 mg total) by mouth daily. 06/16/22   [name], DO  busPIRone (BUSPAR) 15 MG tablet TAKE 1 TABLET 3 TIMES A DAY 06/16/22   Ronnie Doss M, DO  cholecalciferol (VITAMIN D) 25 MCG (1000 UNIT) tablet Take 1,000 Units by mouth daily.     [provider]  D-MANNOSE PO Take 1 tablet by mouth daily.    [provider]  esomeprazole (NEXIUM) 40 MG capsule TAKE ONE CAPSULE DAILY AT NOON 12/02/21   Nandigam, Venia Minks, MD  estradiol (ESTRACE) 0.1 MG/GM vaginal cream INSERT 1 GRAM VAGINALLY AT BEDTIME FOR 2 WEEKS, THEN TWICE A WEEK THEREAFTER 11/12/20   [provider]  fluticasone (FLONASE) 50 MCG/ACT nasal spray 1 SPRAY IN EACH NOSTRIL DAILY AS NEEDED FOR ALLERGIES 03/15/22   Ronnie Doss M, DO  furosemide (LASIX) 20 MG tablet Take 1 tablet by mouth on Monday and Friday than take as needed. 07/12/22   Shirley Friar, PA-C  guaiFENesin (MUCINEX) 600 MG 12 hr tablet Take 600 mg by mouth 2 (two) times daily as needed for cough.    [provider]  levocetirizine (XYZAL) 5 MG tablet Take 1 tablet (5 mg total) by mouth every evening. 09/30/21   [name], DO  LORazepam (ATIVAN) 1 MG tablet Take 1 tablet every night at bedtime.  May take 1/2 tablet during daytime if needed. 06/28/22   [name], DO  losartan (COZAAR) 25 MG tablet Take 1 tablet (25 mg total) by mouth daily. 07/12/22   Shirley Friar, PA-C  meclizine (ANTIVERT) 25 MG tablet Take 1 tablet (25 mg total) by  mouth 3 (three) times daily as needed for dizziness. 04/07/18   Terald Sleeper, PA-C  medroxyPROGESTERone (PROVERA) 2.5 MG tablet Take 1 tablet (2.5 mg total) by mouth daily. 06/16/22   [name], DO  methenamine (HIPREX) 1 g tablet Take 1 g by mouth 2 (two) times daily. 04/16/22   [provider]  metoprolol succinate (TOPROL XL) 25 MG 24 hr tablet Take 1 tablet (25 mg total) by mouth daily. 06/28/22   Shirley Friar, PA-C  mirtazapine (REMERON) 7.5 MG tablet Take 1 tablet (7.5 mg total) by mouth at bedtime. For panic/ sleep/ appetite 06/16/22   Ronnie Doss M, DO  polyethylene glycol powder (GLYCOLAX/MIRALAX) powder Take 1 Container by mouth daily as needed for mild constipation.    [provider]  rosuvastatin (CRESTOR) 10 MG tablet Take 1 tablet (10 mg total) by mouth daily. 06/16/22   [name], DO  spironolactone (ALDACTONE) 25 MG tablet Take 1 tablet (25 mg total) by mouth daily. 07/12/22   Shirley Friar, PA-C  trolamine salicylate (ASPERCREME) 10 % cream Apply 1 application  topically as needed for muscle pain.    [provider]  Zinc Sulfate (ZINC 15 PO) Take by mouth.    [provider]    Inpatient Medications: Scheduled Meds:  docusate  100 mg Per Tube BID   famotidine  20 mg Per Tube BID   polyethylene glycol  17 g Per Tube Daily   Continuous Infusions:  sodium chloride Stopped (07/12/22 2300)   amiodarone 60 mg/hr (07/13/22 1234)   Followed  by   amiodarone     heparin 900 Units/hr (07/13/22 1315)   magnesium sulfate bolus IVPB     propofol (DIPRIVAN) infusion 25 mcg/kg/min (07/13/22 1304)   PRN Meds: [START ON 07/14/2022] acetaminophen **OR** [START ON 07/14/2022] acetaminophen (TYLENOL) oral liquid 160 mg/5 mL **OR** [START ON 07/14/2022] acetaminophen, fentaNYL (SUBLIMAZE) injection, fentaNYL (SUBLIMAZE) injection, ondansetron (ZOFRAN) IV, perflutren lipid microspheres (DEFINITY) IV suspension  Allergies:    Allergies  Allergen Reactions   Zoloft [Sertraline Hcl] Nausea Only        Amoxapine And Related Rash   Cephalexin Rash   Oxycodone     "Loopy"   Penicillins Rash    Social History:   Social History   Socioeconomic History   Marital status: Married    Spouse name: Gwyndolyn Saxon   Number of children: 1   Years of education: Not on file   Highest education level: Not on file  Occupational History   Occupation: REGISTRATION   Occupation: retired  Tobacco Use   Smoking status: Never   Smokeless tobacco: Never  Vaping Use   Vaping Use: Never used  Substance and Sexual Activity   Alcohol use: No   Drug use: No   Sexual activity: Not on file  Other Topics Concern   Not on file  Social History Narrative   Lives with spouse in [place] Strain: [place]  (01/08/2022)   Overall Financial Resource Strain (CARDIA)    Difficulty of Paying Living Expenses: Not hard at all  Food Insecurity: No Food Insecurity (01/08/2022)   Hunger Vital Sign    Worried About Running Out of Food in the Last Year: Never true    [place] in the Last Year: Never true  Transportation Needs: No Transportation Needs (01/08/2022)   PRAPARE - Hydrologist (Medical): No  Lack of Transportation (Non-Medical): No  Physical Activity: Insufficiently Active (01/08/2022)   Exercise Vital Sign    Days of Exercise per Week: 1 day    Minutes of Exercise per  Session: 30 min  Stress: No Stress Concern Present (01/08/2022)   [place]    Feeling of Stress : Not at all  Social Connections: [place] (01/08/2022)   Social Connection and Isolation Panel [NHANES]    Frequency of Communication with Friends and Family: More than three times a week    Frequency of Social Gatherings with Friends and Family: More than three times a week    Attends Religious Services: More than 4 times per year    Active Member of [employer] or Organizations: Yes    Attends Music therapist: More than 4 times per year    Marital Status: Married  Human resources officer Violence: Not At Risk (01/08/2022)   Humiliation, Afraid, Rape, and Kick questionnaire    Fear of Current or Ex-Partner: No    Emotionally Abused: No    Physically Abused: No    Sexually Abused: No    Family History:    Family History  Problem Relation Age of Onset   Diabetes Mother    Stroke Mother    Diabetes Father    Lung cancer Father    Heart disease Maternal Aunt    Breast cancer Maternal Aunt    Heart disease Maternal Grandmother    Lung cancer Maternal Grandfather    Colon cancer Neg Hx    Esophageal cancer Neg Hx    Pancreatic cancer Neg Hx    Rectal cancer Neg Hx    Stomach cancer Neg Hx      ROS:  Please see the history of present illness.  All other ROS reviewed and negative.     Physical Exam/Data:   Vitals:   07/13/22 1340 07/13/22 1345 07/13/22 1400 07/13/22 1446  BP:  108/68 103/68 (!) 103/47  Pulse:   (!) 35   Resp:  (!) 27 (!) 25 16  Temp: (!) 97.5 F (36.4 C)     TempSrc: Temporal     SpO2:   100%   Weight:      Height:        Intake/Output Summary (Last 24 hours) at 07/13/2022 1538 Last data filed at 07/13/2022 0023 Gross per 24 hour  Intake 100 ml  Output --  Net 100 ml      07/12/2022   11:10 PM 07/12/2022    8:48 AM 07/12/2022   12:31 AM  Last 3 Weights  Weight (lbs) 136  lb 14.5 oz 137 lb 138 lb 14.2 oz  Weight (kg) 62.1 kg 62.143 kg 63 kg     Body mass index is 25.87 kg/m.  General:  Well nourished, though thin HEENT: normal Neck: no JVD Vascular: No carotid bruits Cardiac:  RRR; 2/6 SM Lungs:  CTA b/l, no wheezing, rhonchi or rales  Abd: soft Ext: no edema Musculoskeletal:  No deformities Skin: warm and dry  Neuro:  unable to assess Psych:  unable to assess  EKG:  The EKG was personally reviewed and demonstrates:    Underlying is difficult though V paced at 118 (likely ST)  Earlier in the day (at the prior ER visit): SR/V paced, an AV paced beat  PVC  Telemetry:  Telemetry was personally reviewed and demonstrates:   Mostly appears a V paced rhythm with frequent NSVT's of at  least 2 morphologies.   Relevant CV Studies:  07/13/22: TTE  1. Compared to echo from 07/06/22, LVEF is slightly lower and MR does not  appear as severe.   2. Frequent PVCs, couplets, triplets noted during study.. Left  ventricular ejection fraction, by estimation, is <20%. The left ventricle  has severely decreased function. The left ventricle demonstrates global  hypokinesis.   3. Right ventricular systolic function is low normal. The right  ventricular size is normal. There is normal pulmonary artery systolic  pressure.   4. Left atrial size was severely dilated.   5. Pacer wire present.   6. Mitral regurgitation is eccentric, directed more posterior Ther is an  echo bright density along subvalvular surface May represtne calcified  chordae (image 68 ). Moderate mitral valve regurgitation.   7. The aortic valve is tricuspid. Aortic valve regurgitation is not  visualized.   8. The inferior vena cava is normal in size with greater than 50%  respiratory variability, suggesting right atrial pressure of 3 mmHg.    07/06/22: TTE 1. Left ventricular ejection fraction, by estimation, is 25 to 30%. The  left ventricle has severely decreased function. The left ventricle   demonstrates global hypokinesis. The left ventricular internal cavity size  was mildly dilated. There is mild  asymmetric left ventricular hypertrophy of the basal-septal segment. Left  ventricular diastolic parameters are consistent with Grade II diastolic  dysfunction (pseudonormalization).   2. Right ventricular systolic function is mildly reduced. The right  ventricular size is normal. There is moderately elevated pulmonary artery  systolic pressure. The estimated right ventricular systolic pressure is  95.6 mmHg.   3. Left atrial size was severely dilated.   4. The mitral valve is abnormal, the valve is calcified and posterior  leaflet appears restricted. Severe mitral valve regurgitation. No evidence  of mitral stenosis.   5. Tricuspid valve regurgitation is mild to moderate.   6. The aortic valve is tricuspid. Aortic valve regurgitation is not  visualized. No aortic stenosis is present.   7. The inferior vena cava is normal in size with greater than 50%  respiratory variability, suggesting right atrial pressure of 3 mmHg.   8. Function appears significant worse than prior echo, consider CHF  clinic referral.    08/17/19: TTE  1. Left ventricular ejection fraction, by estimation, is 50 to 55%. The  left ventricle has low normal function. The left ventricle has no regional  wall motion abnormalities. The left ventricular internal cavity size was  mildly dilated. There is mild  asymmetric left ventricular hypertrophy of the basal-septal segment. Left  ventricular diastolic parameters are indeterminate.   2. Right ventricular systolic function is normal. The right ventricular  size is normal. There is mildly elevated pulmonary artery systolic  pressure. The estimated right ventricular systolic pressure is 38.7 mmHg.   3. Mild mitral valve prolapse, anterior leaflet > posterior leaflet with  anterior leaflet override and mild-moderate, posteriorly directed mitral  regurgitation.  The mitral valve is abnormal. Mild to moderate mitral valve  regurgitation. No evidence of  mitral stenosis.   4. Tricuspid valve regurgitation is mild to moderate.   5. The aortic valve is abnormal. Aortic valve regurgitation is not  visualized. No aortic stenosis is present.   6. The inferior vena cava is normal in size with greater than 50%  respiratory variability, suggesting right atrial pressure of 3 mmHg.   Comparison(s): A prior study was performed on 08/11/2017. Prior images  reviewed side by side.  No significant change in LV function. Mitral  regurgitation has increased.    Laboratory Data:  High Sensitivity Troponin:   Recent Labs  Lab 07/12/22 0057 07/12/22 0248 07/12/22 1945 07/12/22 2223 07/13/22 0610  TROPONINIHS 19* 23* 463* 753* 830*     Chemistry Recent Labs  Lab 07/12/22 0057 07/12/22 1945 07/12/22 1951 07/12/22 1959 07/12/22 2116 07/12/22 2356 07/13/22 0915  NA 127* 130* 135 135 135 129* 133*  K 3.7 3.2* 3.3* 3.2* 3.7 5.3* 4.6  CL 96* 100 100 100  --   --  100  CO2 22 17*  --   --   --   --  19*  GLUCOSE 173* 198* 197* 196*  --   --  109*  BUN '11 16 19 18  '$ --   --  22  CREATININE 0.95 1.44* 1.30* 1.30*  --   --  1.32*  CALCIUM 9.0 8.0*  --   --   --   --  8.6*  MG  --   --   --   --   --   --  1.9  GFRNONAA >60 38*  --   --   --   --  43*  ANIONGAP 9 13  --   --   --   --  14    Recent Labs  Lab 07/12/22 0057 07/12/22 1945 07/13/22 0915  PROT 6.7 6.4* 7.1  ALBUMIN 3.9 3.7 4.3  AST 26 717* 620*  ALT 16 627* 609*  ALKPHOS 53 77 77  BILITOT 0.6 0.9 0.7   Lipids  Recent Labs  Lab 07/13/22 0915  TRIG 78    Hematology Recent Labs  Lab 07/12/22 0057 07/12/22 1945 07/12/22 1951 07/12/22 2116 07/12/22 2356 07/13/22 0610  WBC 7.9 12.3*  --   --   --  12.9*  RBC 3.90 4.07  --   --   --  3.97  HGB 12.9 13.5   < > 13.6 13.3 13.1  HCT 36.9 40.4   < > 40.0 39.0 38.2  MCV 94.6 99.3  --   --   --  96.2  MCH 33.1 33.2  --   --   --   33.0  MCHC 35.0 33.4  --   --   --  34.3  RDW 11.6 11.6  --   --   --  11.8  PLT 201 177  --   --   --  168   < > = values in this interval not displayed.   Thyroid No results for input(s): "TSH", "FREET4" in the last 168 hours.  BNP Recent Labs  Lab 07/12/22 0057  BNP 547.6*    DDimer No results for input(s): "DDIMER" in the last 168 hours.   Radiology/Studies:  CT HEAD WO CONTRAST (5MM) Result Date: 07/13/2022 CLINICAL DATA:  Mental status change. EXAM: CT HEAD WITHOUT CONTRAST TECHNIQUE: Contiguous axial images were obtained from the base of the skull through the vertex without intravenous contrast. RADIATION DOSE REDUCTION: This exam was performed according to the departmental dose-optimization program which includes automated exposure control, adjustment of the mA and/or kV according to patient size and/or use of iterative reconstruction technique. COMPARISON:  None Available. FINDINGS: Brain: No evidence of acute infarction, hemorrhage, hydrocephalus, extra-axial collection or mass lesion/mass effect. Vascular: No hyperdense vessel or unexpected calcification. Skull: Normal. Negative for fracture or focal lesion. Sinuses/Orbits: No acute finding. IMPRESSION: No acute intracranial process. Electronically Signed   By: Samuel Germany.D.  On: 07/13/2022 12:49    EEG adult Result Date: 07/13/2022 Lora Havens, MD     07/13/2022  8:31 AM Patient Name: Carmen Green MRN: [id number] Epilepsy Attending: Lora Havens Referring Physician/Provider: Corey Harold, NP Date: 07/12/2022 Duration: 21.13 mins Patient history: 72 year old female status post cardiac arrest.  EEG to evaluate for seizure. Level of alertness: comatose AEDs during EEG study: Propofol Technical aspects: This EEG study was done with scalp electrodes positioned according to the 10-20 International system of electrode placement. Electrical activity was reviewed with band pass filter of 1-'70Hz'$ , sensitivity of 7 uV/mm,  display speed of 58m/sec with a '60Hz'$  notched filter applied as appropriate. EEG data were recorded continuously and digitally stored.  Video monitoring was available and reviewed as appropriate. Description: EEG showed continuous generalized background suppression which was not reactive to tactile stimulation.  Hyperventilation and photic stimulation were not performed.    ABNORMALITY -Background suppression, generalized  IMPRESSION: This study is suggestive of profound diffuse encephalopathy, nonspecific etiology. No seizures or epileptiform discharges were seen throughout the recording.  PLora Havens  DG Chest Port 1 View Result Date: 07/12/2022 CLINICAL DATA:  Intubation EXAM: PORTABLE CHEST 1 VIEW COMPARISON:  Chest x-ray dated July 12, 2022 FINDINGS: ETT tip is proximally 2 cm from the carina. Esophageal temperature probe noted. NG/OG tube is partially visualized coursing below the diaphragm. Left chest wall dual lead pacer unchanged lead position. Capped right chest wall leads. Cardiomegaly, similar to prior when accounting for differences in exam technique. New diffuse interstitial opacities, likely due to pulmonary edema. No large pleural effusion or pneumothorax. New right-sided rib fractures. IMPRESSION: 1. ETT tip is proximally 2 cm from the carina. 2. Diffuse interstitial opacities, likely due to pulmonary edema. 3. New right-sided rib fractures. Electronically Signed   By: LYetta GlassmanM.D.   On: 07/12/2022 20:26   DG Chest 2 View Result Date: 07/12/2022 CLINICAL DATA:  Shortness of breath. EXAM: CHEST - 2 VIEW COMPARISON:  May 27, 2022 FINDINGS: There is stable multi lead AICD wire positioning. The heart size and mediastinal contours are within normal limits. Mild prominence of the perihilar pulmonary vasculature is seen. Mild, diffuse, chronic appearing increased lung markings are seen. Mild atelectasis is noted within the left lung base. There is no evidence of a pleural  effusion or pneumothorax. The visualized skeletal structures are unremarkable. IMPRESSION: 1. Mild pulmonary vascular congestion. 2. Mild left basilar atelectasis. Electronically Signed   By: TVirgina NorfolkM.D.   On: 07/12/2022 01:10     Assessment and Plan:   Cardiac arrest In my review of EMS record appears she was shocked by the AED  CPR, intubated PEA is mention in record as well then When EMS arrived epi > VF > defibrillated > lidocaine ROSC achieved though had intermittent loss of pulse with 2 more rounds of epi  Newly found reduced LVEF 25-30% and severe MR Echo post arrest here with LVEF <20% and less MR  Base pacing rate was increased from 50 > 90 with good suppression of her PVCs, NSVTs She has also gotten amio bolus >> gtt  By our check on her device today, no recorded events. In review with industry, VF could certainly have been missed with V sensing at 4.020m(appropriately with report of being device dependent)   Initially hypokalemic > better  Keep K+ and Mag supplemented  Pending her clinical course/recovery TEE and a cath  Dr. LaMardene Speakhoughts to follow  Risk Assessment/Risk Scores:    For questions or updates, please contact [place] Please consult [email or web address] for contact info under    Signed, Baldwin [place], PA-C  07/13/2022 3:38 PM

## 2022-07-13 NOTE — Procedures (Signed)
Patient Name: Carmen Green  MRN: [id number]  Epilepsy Attending: Lora Havens  Referring Physician/Provider: Corey Harold, NP  Date: 07/12/2022 Duration: 21.13 mins  Patient history: 72 year old female status post cardiac arrest.  EEG to evaluate for seizure.  Level of alertness: comatose  AEDs during EEG study: Propofol  Technical aspects: This EEG study was done with scalp electrodes positioned according to the 10-20 International system of electrode placement. Electrical activity was reviewed with band pass filter of 1-'70Hz'$ , sensitivity of 7 uV/mm, display speed of 18m/sec with a '60Hz'$  notched filter applied as appropriate. EEG data were recorded continuously and digitally stored.  Video monitoring was available and reviewed as appropriate.  Description: EEG showed continuous generalized background suppression which was not reactive to tactile stimulation.  Hyperventilation and photic stimulation were not performed.      ABNORMALITY -Background suppression, generalized   IMPRESSION: This study is suggestive of profound diffuse encephalopathy, nonspecific etiology. No seizures or epileptiform discharges were seen throughout the recording.   Rhylen Shaheen OBarbra Sarks

## 2022-07-13 NOTE — Progress Notes (Signed)
ANTICOAGULATION CONSULT NOTE  Pharmacy Consult for heparin infusion Indication:  Afib with possible ACS  Allergies  Allergen Reactions   Zoloft [Sertraline Hcl] Nausea Only        Amoxapine And Related Rash   Cephalexin Rash   Oxycodone     "Loopy"   Penicillins Rash    Patient Measurements: Height: '5\' 1"'$  (154.9 cm) Weight: 62.1 kg (136 lb 14.5 oz) IBW/kg (Calculated) : 47.8 Heparin Dosing Weight: 60kg  Vital Signs: Temp: 97.3 F (36.3 C) (01/23 2100) Temp Source: Bladder (01/23 1600) BP: 70/50 (01/23 2150) Pulse Rate: 92 (01/23 2125)  Labs: Recent Labs    07/12/22 1945 07/12/22 1951 07/12/22 1959 07/12/22 2116 07/12/22 2223 07/12/22 2356 07/13/22 0610 07/13/22 0915 07/13/22 1715 07/13/22 2102  HGB 13.5   < > 13.6   < >  --  13.3 13.1  --  13.6  --   HCT 40.4   < > 40.0   < >  --  39.0 38.2  --  39.2  --   PLT 177  --   --   --   --   --  168  --  155  --   HEPARINUNFRC  --   --   --   --   --   --   --  0.23*  --  0.18*  CREATININE 1.44*   < > 1.30*  --   --   --   --  1.32* 0.99  --   TROPONINIHS 463*  --   --   --  753*  --  830*  --   --   --    < > = values in this interval not displayed.     Estimated Creatinine Clearance: 42.7 mL/min (by C-G formula based on SCr of 0.99 mg/dL).   Assessment: 72yo female had witnessed arrest at home, required four rounds and shock x2 before ROSC, to begin heparin for Afib and possible ACS.  Heparin level remains subtherapeutic (0.18) on infusion at 900 units/hr. No issues with line or bleeding reported per RN.  Goal of Therapy:  Heparin level 0.3-0.7 units/ml Monitor platelets by anticoagulation protocol: Yes   Plan:  Rebolus heparin 1500 units Increase heparin infusion rate to 1050 units/hr Check heparin level in 8 hours  Sherlon Handing, PharmD, BCPS Please see amion for complete clinical pharmacist phone list 07/13/2022 9:55 PM

## 2022-07-13 NOTE — ED Notes (Addendum)
eLink made aware of patient's temperature. They advised they would let CC MD know to place orders.

## 2022-07-13 NOTE — Progress Notes (Signed)
EEG complete - results pending 

## 2022-07-13 NOTE — ED Notes (Signed)
Prop turned off at 0954 & so far no response from pt.

## 2022-07-13 NOTE — ED Notes (Signed)
Pt transported to CT with this RN & RT.

## 2022-07-13 NOTE — Progress Notes (Signed)
Unsuccessful PICC attempted x1 in the RUA. Unable to thread PICC catheter to SVC. Procedure aborted. RN made aware.

## 2022-07-13 NOTE — Progress Notes (Signed)
ANTICOAGULATION CONSULT NOTE - Initial Consult  Pharmacy Consult for heparin infusion Indication:  Afib with possible ACS  Allergies  Allergen Reactions   Zoloft [Sertraline Hcl] Nausea Only        Amoxapine And Related Rash   Cephalexin Rash   Oxycodone     "Loopy"   Penicillins Rash    Patient Measurements: Height: '5\' 1"'$  (154.9 cm) Weight: 62.1 kg (136 lb 14.5 oz) IBW/kg (Calculated) : 47.8 Heparin Dosing Weight: 60kg  Vital Signs: Temp: 99 F (37.2 C) (01/23 0930) BP: 151/95 (01/23 1130) Pulse Rate: 40 (01/23 1130)  Labs: Recent Labs    07/12/22 0057 07/12/22 0248 07/12/22 1945 07/12/22 1951 07/12/22 1959 07/12/22 2116 07/12/22 2223 07/12/22 2356 07/13/22 0610 07/13/22 0915  HGB 12.9  --  13.5 13.6 13.6 13.6  --  13.3 13.1  --   HCT 36.9  --  40.4 40.0 40.0 40.0  --  39.0 38.2  --   PLT 201  --  177  --   --   --   --   --  168  --   HEPARINUNFRC  --   --   --   --   --   --   --   --   --  0.23*  CREATININE 0.95  --  1.44* 1.30* 1.30*  --   --   --   --  1.32*  TROPONINIHS 19*   < > 463*  --   --   --  753*  --  830*  --    < > = values in this interval not displayed.     Estimated Creatinine Clearance: 32.1 mL/min (A) (by C-G formula based on SCr of 1.32 mg/dL (H)).   Medical History: Past Medical History:  Diagnosis Date   Allergy    Anemia    Anxiety    Arthritis    Cataract    bil cataracts removed   Colon polyp    Complete heart block (HCC)    Contusion of chest wall 10/10/2007   Centricity Description: CHEST WALL CONTUSION Qualifier: Diagnosis of  By: Aline Brochure MD, Dorothyann Peng   Centricity Description: CONTUSION, RIGHT RIB Qualifier: Diagnosis of  By: Aline Brochure MD, Stanley     Depression    Diverticulitis    Esophagitis    ESOPHAGITIS 06/25/2008   Qualifier: History of  By: Harlon Ditty CMA (AAMA), Dottie     GERD (gastroesophageal reflux disease)    H/O seasonal allergies    Heart murmur    Hiatal hernia    Hyperlipidemia    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  Heparin level 0.23, subtherapeutic Current heparin infusion rate: 800 units/hr  Hgb 13.1, Plt 168 - stable No s/sx of bleeding  Goal of Therapy:  Heparin level 0.3-0.7 units/ml Monitor platelets by anticoagulation protocol: Yes   Plan:  Increase heparin infusion rate to 900 units/hr Check heparin level in 8 hours Monitor daily CBC, heparin levels, and for s/sx of bleeding F/u anticoag plan  Luisa Hart, PharmD, BCPS Clinical Pharmacist 07/13/2022 11:47 AM   Please refer to AMION for pharmacy phone number

## 2022-07-13 NOTE — ED Notes (Signed)
Tried to collect patient blood in three different and warming areas with hot packs, I had no success.

## 2022-07-13 NOTE — Progress Notes (Signed)
Echocardiogram 2D Echocardiogram has been performed.  Fidel Levy 07/13/2022, 2:29 PM

## 2022-07-13 NOTE — Progress Notes (Signed)
   EP consulted. Discussed Carmen Frees PA and Dr Quentin Ore at bedside.   Has known [facility]--> [employer] PPM   Increased ectopy with frequent runs NSVT. On Amio 60 per hour.   Pace Maker   Pacing rate increased from 50 ---> 90. Less ectopy noted.    Give 4 grams Mag now.   Dajha Urquilla NP-C  3:41 PM

## 2022-07-13 NOTE — Progress Notes (Signed)
Peripherally Inserted Central Catheter Placement  The IV Nurse has discussed with the patient and/or persons authorized to consent for the patient, the purpose of this procedure and the potential benefits and risks involved with this procedure.  The benefits include less needle sticks, lab draws from the catheter, and the patient may be discharged home with the catheter. Risks include, but not limited to, infection, bleeding, blood clot (thrombus formation), and puncture of an artery; nerve damage and irregular heartbeat and possibility to perform a PICC exchange if needed/ordered by physician.  Alternatives to this procedure were also discussed.  Bard Power PICC patient education guide, fact sheet on infection prevention and patient information card has been provided to patient /or left at bedside.    PICC Placement Documentation    Telephone consent signed by husband    [name] 07/13/2022, 11:11 AM

## 2022-07-13 NOTE — Progress Notes (Signed)
Pt transported on vent from [facility] to CT and back without any complications. RN at bedside, RT will monitor.

## 2022-07-13 NOTE — Progress Notes (Signed)
Pt transported on vent from [facility] to 2H16 without any complications. RN at bedside, RT will monitor.

## 2022-07-13 NOTE — ED Notes (Signed)
ICU provider at bedside

## 2022-07-13 NOTE — ED Notes (Signed)
ECHO at bedside.

## 2022-07-13 NOTE — Procedures (Signed)
Central Venous Catheter Insertion Procedure Note  ALURA OLVEDA  [id number]  1950-10-18  Date:07/13/22  Time:4:06 PM   Provider Performing:Rael Yo D Rollene Rotunda   Procedure: Insertion of Non-tunneled Central Venous [phone]) with US guidance (38101)   Indication(s) Medication administration  Consent Risks of the procedure as well as the alternatives and risks of each were explained to the patient and/or caregiver.  Consent for the procedure was obtained and is signed in the bedside chart  Anesthesia Topical only with 1% lidocaine   Timeout Verified patient identification, verified procedure, site/side was marked, verified correct patient position, special equipment/implants available, medications/allergies/relevant history reviewed, required imaging and test results available.  Sterile Technique Maximal sterile technique including full sterile barrier drape, hand hygiene, sterile gown, sterile gloves, mask, hair covering, sterile ultrasound probe cover (if used).  Procedure Description Area of catheter insertion was cleaned with chlorhexidine and draped in sterile fashion.  With real-time ultrasound guidance a central venous catheter was placed into the right internal jugular vein. Nonpulsatile blood flow and easy flushing noted in all ports.  The catheter was sutured in place and sterile dressing applied.  Complications/Tolerance None; patient tolerated the procedure well. Chest X-ray is ordered to verify placement for internal jugular or subclavian cannulation.   Chest x-ray is not ordered for femoral cannulation.  EBL Minimal  Specimen(s) None  JD Rexene Agent [facility] 07/13/2022, 4:07 PM  Please see [email or web address] for pager details.  From 7A-7P if no response, please call [phone]. After hours, please call ELink [phone].

## 2022-07-13 NOTE — Consult Note (Signed)
Advanced Heart Failure Team Consult Note   Primary Physician: [name], DO PCP-Cardiologist:  None  Reason for Consultation: PEA Arrest-->shock   HPI:    Carmen Green is seen today for evaluation of PEA arrest  at the request of Dr Silas Flood.   Carmen Green is a 72 year old with a history of CHB St Jude PPM, MR, HTN, HLD, and chronic HFrEF. No previous ischemic work up.   Saw EP on 06/28/22. Functional decline associated with increased dyspnea and fatigue. ECHO ordered. Toprol 25 mg daily added.   Echo showed EF down 25-30%  global HK, Grade II DD, RV mildly reduced, LA severely dilated, and sever MV regurgiation.   She was seen in the ED 07/12/22 with increased dypsnea. K 3.7, HS Trop 19 >23. Given IV lasix with improvement and sent home. Later that day she was seen in EP office. Lisinopril /HCTS was stopped. Losartan and spironolactone started. Also started on lasix 2 times a week and plan for additional work up given EF was down to 25% and severe MR.   Had witnessed arrest later that evening at 630. Family performed CPR. EMS arrived AED--> not shockable rhythm. Given 4 rounds of EPI--> V Fib and shocked x2 -> ROSC. Unclear down time.   On arrival to ED intubated/sedated. Norepi started for hypotension. EKG  with PVCs ?  Fib. CXR with r sided rib fractures and pulmonary edema. Given dose of IV lasix with brisk diuresis noted. K 3.2, CO2 17, WBC 12.3, HS Trop 463>753.  Respiratory panel- negative. Started on heparin drip.    EEG- profound diffuse encephalopathy, nonspecific etiology. No seizures or epileptiform discharges   POCUS - IVC does not appear dilated.    Review of Systems: [y] = yes, '[ ]'$  = no Patient is encephalopathic and or intubated. Therefore history has been obtained from chart review.    General: Weight gain '[ ]'$ ; Weight loss '[ ]'$ ; Anorexia '[ ]'$ ; Fatigue '[ ]'$ ; Fever '[ ]'$ ; Chills '[ ]'$ ; Weakness '[ ]'$   Cardiac: Chest pain/pressure '[ ]'$ ; Resting SOB '[ ]'$ ; Exertional  SOB '[ ]'$ ; Orthopnea '[ ]'$ ; Pedal Edema '[ ]'$ ; Palpitations '[ ]'$ ; Syncope '[ ]'$ ; Presyncope '[ ]'$ ; Paroxysmal nocturnal dyspnea'[ ]'$   Pulmonary: Cough '[ ]'$ ; Wheezing'[ ]'$ ; Hemoptysis'[ ]'$ ; Sputum '[ ]'$ ; Snoring '[ ]'$   GI: Vomiting'[ ]'$ ; Dysphagia'[ ]'$ ; Melena'[ ]'$ ; Hematochezia '[ ]'$ ; Heartburn'[ ]'$ ; Abdominal pain '[ ]'$ ; Constipation '[ ]'$ ; Diarrhea '[ ]'$ ; BRBPR '[ ]'$   GU: Hematuria'[ ]'$ ; Dysuria '[ ]'$ ; Nocturia'[ ]'$   Vascular: Pain in legs with walking '[ ]'$ ; Pain in feet with lying flat '[ ]'$ ; Non-healing sores '[ ]'$ ; Stroke '[ ]'$ ; TIA '[ ]'$ ; Slurred speech '[ ]'$ ;  Neuro: Headaches'[ ]'$ ; Vertigo'[ ]'$ ; Seizures'[ ]'$ ; Paresthesias'[ ]'$ ;Blurred vision '[ ]'$ ; Diplopia '[ ]'$ ; Vision changes '[ ]'$   Ortho/Skin: Arthritis '[ ]'$ ; Joint pain '[ ]'$ ; Muscle pain '[ ]'$ ; Joint swelling '[ ]'$ ; Back Pain '[ ]'$ ; Rash '[ ]'$   Psych: Depression'[ ]'$ ; Anxiety'[ ]'$   Heme: Bleeding problems '[ ]'$ ; Clotting disorders '[ ]'$ ; Anemia '[ ]'$   Endocrine: Diabetes '[ ]'$ ; Thyroid dysfunction'[ ]'$   Home Medications Prior to Admission medications   Medication Sig Start Date End Date Taking? Authorizing Provider  Ascorbic Acid (VITAMIN C) 1000 MG tablet Take 1,000 mg by mouth daily.    [provider]  buPROPion (WELLBUTRIN XL) 300 MG 24 hr tablet Take 1 tablet (300 mg total) by mouth daily. 06/16/22   [name],  DO  busPIRone (BUSPAR) 15 MG tablet TAKE 1 TABLET 3 TIMES A DAY 06/16/22   Ronnie Doss M, DO  cholecalciferol (VITAMIN D) 25 MCG (1000 UNIT) tablet Take 1,000 Units by mouth daily.     [provider]  D-MANNOSE PO Take 1 tablet by mouth daily.    [provider]  esomeprazole (NEXIUM) 40 MG capsule TAKE ONE CAPSULE DAILY AT NOON 12/02/21   Nandigam, Venia Minks, MD  estradiol (ESTRACE) 0.1 MG/GM vaginal cream INSERT 1 GRAM VAGINALLY AT BEDTIME FOR 2 WEEKS, THEN TWICE A WEEK THEREAFTER 11/12/20   [provider]  fluticasone (FLONASE) 50 MCG/ACT nasal spray 1 SPRAY IN EACH NOSTRIL DAILY AS NEEDED FOR ALLERGIES 03/15/22   Ronnie Doss M, DO  furosemide (LASIX) 20  MG tablet Take 1 tablet by mouth on Monday and Friday than take as needed. 07/12/22   Shirley Friar, PA-C  guaiFENesin (MUCINEX) 600 MG 12 hr tablet Take 600 mg by mouth 2 (two) times daily as needed for cough.    [provider]  levocetirizine (XYZAL) 5 MG tablet Take 1 tablet (5 mg total) by mouth every evening. 09/30/21   [name], DO  LORazepam (ATIVAN) 1 MG tablet Take 1 tablet every night at bedtime.  May take 1/2 tablet during daytime if needed. 06/28/22   [name], DO  losartan (COZAAR) 25 MG tablet Take 1 tablet (25 mg total) by mouth daily. 07/12/22   Shirley Friar, PA-C  meclizine (ANTIVERT) 25 MG tablet Take 1 tablet (25 mg total) by mouth 3 (three) times daily as needed for dizziness. 04/07/18   Terald Sleeper, PA-C  medroxyPROGESTERone (PROVERA) 2.5 MG tablet Take 1 tablet (2.5 mg total) by mouth daily. 06/16/22   [name], DO  methenamine (HIPREX) 1 g tablet Take 1 g by mouth 2 (two) times daily. 04/16/22   [provider]  metoprolol succinate (TOPROL XL) 25 MG 24 hr tablet Take 1 tablet (25 mg total) by mouth daily. 06/28/22   Shirley Friar, PA-C  mirtazapine (REMERON) 7.5 MG tablet Take 1 tablet (7.5 mg total) by mouth at bedtime. For panic/ sleep/ appetite 06/16/22   Ronnie Doss M, DO  polyethylene glycol powder (GLYCOLAX/MIRALAX) powder Take 1 Container by mouth daily as needed for mild constipation.    [provider]  rosuvastatin (CRESTOR) 10 MG tablet Take 1 tablet (10 mg total) by mouth daily. 06/16/22   [name], DO  spironolactone (ALDACTONE) 25 MG tablet Take 1 tablet (25 mg total) by mouth daily. 07/12/22   Shirley Friar, PA-C  trolamine salicylate (ASPERCREME) 10 % cream Apply 1 application  topically as needed for muscle pain.    [provider]  Zinc Sulfate (ZINC 15 PO) Take by mouth.    [provider]    Past Medical History: Past Medical  History:  Diagnosis Date   Allergy    Anemia    Anxiety    Arthritis    Cataract    bil cataracts removed   Colon polyp    Complete heart block ([place])    Contusion of chest wall 10/10/2007   Centricity Description: CHEST WALL CONTUSION Qualifier: Diagnosis of  By: Aline Brochure MD, Dorothyann Peng   Centricity Description: CONTUSION, RIGHT RIB Qualifier: Diagnosis of  By: Aline Brochure MD, Stanley     Depression    Diverticulitis    Esophagitis    ESOPHAGITIS 06/25/2008   Qualifier: History of  By: Harlon Ditty CMA (  AAMA), Dottie     GERD (gastroesophageal reflux disease)    H/O seasonal allergies    Heart murmur    Hiatal hernia    Hyperlipidemia    s/p PPM implant by Dr Olevia Perches 1992 with generator change 2004 ([facility]), she is device dependant   Hypertension    Mitral valve prolapse    Pacemaker     Past Surgical History: Past Surgical History:  Procedure Laterality Date   APPENDECTOMY     BREAST EXCISIONAL BIOPSY Right 07/08/1998   BREAST SURGERY Right    biopsy   COLONOSCOPY  10/19/2004   [place]   EYE SURGERY Left    cataract extraction with IOL   LAPAROSCOPIC LOW ANTERIOR RESECTION  11/16/2013   Robotic   LAPAROSCOPIC LYSIS OF ADHESIONS N/A 11/15/2013   Procedure: LAPAROSCOPIC LYSIS OF ADHESIONS;  Surgeon: Adin Hector, MD;  Location: [facility];  Service: General;  Laterality: N/A;   left ovary cyst removal  [place] 01/22/2020   Procedure: PACEMAKER IMPLANT;  Surgeon: Thompson Grayer, MD;  Location: [facility];  Service: Cardiovascular;  Laterality: N/A;   PACEMAKER INSERTION  1992, 2004, 2012   device dependant   POLYPECTOMY     PROCTOSCOPY  11/15/2013   Procedure: RIGID PROCTOSCOPY;  Surgeon: Adin Hector, MD;  Location: [facility];  Service: General;;   ROBOTIC ASSISTED SALPINGO OOPHERECTOMY Left 11/16/2013   Robotic en bloc w LAR resection   TUBAL LIGATION     UPPER GASTROINTESTINAL ENDOSCOPY      Family History: Family History  Problem Relation Age of Onset    Diabetes Mother    Stroke Mother    Diabetes Father    Lung cancer Father    Heart disease Maternal Aunt    Breast cancer Maternal Aunt    Heart disease Maternal Grandmother    Lung cancer Maternal Grandfather    Colon cancer Neg Hx    Esophageal cancer Neg Hx    Pancreatic cancer Neg Hx    Rectal cancer Neg Hx    Stomach cancer Neg Hx     Social History: Social History   Socioeconomic History   Marital status: Married    Spouse name: Gwyndolyn Saxon   Number of children: 1   Years of education: Not on file   Highest education level: Not on file  Occupational History   Occupation: REGISTRATION   Occupation: retired  Tobacco Use   Smoking status: Never   Smokeless tobacco: Never  Vaping Use   Vaping Use: Never used  Substance and Sexual Activity   Alcohol use: No   Drug use: No   Sexual activity: Not on file  Other Topics Concern   Not on file  Social History Narrative   Lives with spouse in [place] Determinants of Health   Financial Resource Strain: Low Risk  (01/08/2022)   Overall Financial Resource Strain (CARDIA)    Difficulty of Paying Living Expenses: Not hard at all  Food Insecurity: No Food Insecurity (01/08/2022)   Hunger Vital Sign    Worried About Running Out of Food in the Last Year: Never true    [place] in the Last Year: Never true  Transportation Needs: No Transportation Needs (01/08/2022)   PRAPARE - Hydrologist (Medical): No    Lack of Transportation (Non-Medical): No  Physical Activity: Insufficiently Active (01/08/2022)   Exercise Vital Sign    Days of Exercise  per Week: 1 day    Minutes of Exercise per Session: 30 min  Stress: No Stress Concern Present (01/08/2022)   [place]    Feeling of Stress : Not at all  Social Connections: [place] (01/08/2022)   Social Connection and Isolation Panel [NHANES]    Frequency of  Communication with Friends and Family: More than three times a week    Frequency of Social Gatherings with Friends and Family: More than three times a week    Attends Religious Services: More than 4 times per year    Active Member of [employer] or Organizations: Yes    Attends Music therapist: More than 4 times per year    Marital Status: Married    Allergies:  Allergies  Allergen Reactions   Zoloft [Sertraline Hcl] Nausea Only        Amoxapine And Related Rash   Cephalexin Rash   Oxycodone     "Loopy"   Penicillins Rash    Objective:    Vital Signs:   Temp:  [95.1 F (35.1 C)-100.4 F (38 C)] 100.3 F (37.9 C) (01/23 0600) Pulse Rate:  [33-104] 66 (01/23 0727) Resp:  [16-40] 35 (01/23 0751) BP: (81-158)/(55-118) 110/72 (01/23 0751) SpO2:  [100 %] 100 % (01/23 0727) FiO2 (%):  [50 %-100 %] 50 % (01/23 0316) Weight:  [62.1 kg] 62.1 kg (01/22 2310)    Weight change: Filed Weights   07/12/22 2310  Weight: 62.1 kg    Intake/Output:   Intake/Output Summary (Last 24 hours) at 07/13/2022 0909 Last data filed at 07/13/2022 0023 Gross per 24 hour  Intake 100 ml  Output --  Net 100 ml      Physical Exam    General:  Intubated.  HEENT: ETT Neck: supple. JVP 6-7  Carotids 2+ bilat; no bruits. No lymphadenopathy or thyromegaly appreciated. Cor: PMI nondisplaced. Regular rate & rhythm. No rubs, gallops or murmurs. Zoll Pads in place Lungs: Rhonchi throughout.  Abdomen: soft, nontender, nondistended. No hepatosplenomegaly. No bruits or masses. Good bowel sounds. Extremities: no cyanosis, clubbing, rash, edema Neuro: Intubated  GU: Foley   Telemetry    Frequent PVCs   EKG     AFlutter 118 QRS 166 Carmen  Labs   Basic Metabolic Panel: Recent Labs  Lab 07/12/22 0057 07/12/22 1945 07/12/22 1951 07/12/22 1959 07/12/22 2116 07/12/22 2356  NA 127* 130* 135 135 135 129*  K 3.7 3.2* 3.3* 3.2* 3.7 5.3*  CL 96* 100 100 100  --   --   CO2 22 17*  --   --    --   --   GLUCOSE 173* 198* 197* 196*  --   --   BUN '11 16 19 18  '$ --   --   CREATININE 0.95 1.44* 1.30* 1.30*  --   --   CALCIUM 9.0 8.0*  --   --   --   --     Liver Function Tests: Recent Labs  Lab 07/12/22 0057 07/12/22 1945  AST 26 717*  ALT 16 627*  ALKPHOS 53 77  BILITOT 0.6 0.9  PROT 6.7 6.4*  ALBUMIN 3.9 3.7   No results for input(s): "LIPASE", "AMYLASE" in the last 168 hours. No results for input(s): "AMMONIA" in the last 168 hours.  CBC: Recent Labs  Lab 07/12/22 0057 07/12/22 1945 07/12/22 1951 07/12/22 1959 07/12/22 2116 07/12/22 2356 07/13/22 0610  WBC 7.9 12.3*  --   --   --   --  12.9*  NEUTROABS 6.4 8.5*  --   --   --   --   --   HGB 12.9 13.5 13.6 13.6 13.6 13.3 13.1  HCT 36.9 40.4 40.0 40.0 40.0 39.0 38.2  MCV 94.6 99.3  --   --   --   --  96.2  PLT 201 177  --   --   --   --  168    Cardiac Enzymes: No results for input(s): "CKTOTAL", "CKMB", "CKMBINDEX", "TROPONINI" in the last 168 hours.  BNP: BNP (last 3 results) Recent Labs    07/12/22 0057  BNP 547.6*    ProBNP (last 3 results) Recent Labs    06/28/22 1040  PROBNP 3,230*     CBG: Recent Labs  Lab 07/13/22 0530 07/13/22 0751  GLUCAP 116* 101*    Coagulation Studies: No results for input(s): "LABPROT", "INR" in the last 72 hours.   Imaging   [place] EKG SITE RITE  Result Date: 07/13/2022 If Site Rite image not attached, placement could not be confirmed due to current cardiac rhythm.  EEG adult  Result Date: 07/13/2022 Lora Havens, MD     07/13/2022  8:31 AM Patient Name: Carmen Green MRN: [id number] Epilepsy Attending: Lora Havens Referring Physician/Provider: Corey Harold, NP Date: 07/12/2022 Duration: 21.13 mins Patient history: 72 year old female status post cardiac arrest.  EEG to evaluate for seizure. Level of alertness: comatose AEDs during EEG study: Propofol Technical aspects: This EEG study was done with scalp electrodes positioned according to  the 10-20 International system of electrode placement. Electrical activity was reviewed with band pass filter of 1-'70Hz'$ , sensitivity of 7 uV/mm, display speed of 42m/sec with a '60Hz'$  notched filter applied as appropriate. EEG data were recorded continuously and digitally stored.  Video monitoring was available and reviewed as appropriate. Description: EEG showed continuous generalized background suppression which was not reactive to tactile stimulation.  Hyperventilation and photic stimulation were not performed.    ABNORMALITY -Background suppression, generalized  IMPRESSION: This study is suggestive of profound diffuse encephalopathy, nonspecific etiology. No seizures or epileptiform discharges were seen throughout the recording.  PLora Havens  DG Chest Port 1 View  Result Date: 07/12/2022 CLINICAL DATA:  Intubation EXAM: PORTABLE CHEST 1 VIEW COMPARISON:  Chest x-ray dated July 12, 2022 FINDINGS: ETT tip is proximally 2 cm from the carina. Esophageal temperature probe noted. NG/OG tube is partially visualized coursing below the diaphragm. Left chest wall dual lead pacer unchanged lead position. Capped right chest wall leads. Cardiomegaly, similar to prior when accounting for differences in exam technique. New diffuse interstitial opacities, likely due to pulmonary edema. No large pleural effusion or pneumothorax. New right-sided rib fractures. IMPRESSION: 1. ETT tip is proximally 2 cm from the carina. 2. Diffuse interstitial opacities, likely due to pulmonary edema. 3. New right-sided rib fractures. Electronically Signed   By: LYetta GlassmanM.D.   On: 07/12/2022 20:26     Medications:     Current Medications:  docusate  100 mg Per Tube BID   famotidine  20 mg Per Tube BID   polyethylene glycol  17 g Per Tube Daily    Infusions:  sodium chloride Stopped (07/12/22 2300)   heparin 800 Units/hr (07/13/22 0306)   milrinone Stopped (07/13/22 0559)   norepinephrine Stopped (07/12/22 2315)    propofol (DIPRIVAN) infusion Stopped (07/13/22 0735)      Patient Profile  Carmen Green a 72year old with a history of CHB St  Jude PPM, MR, HTN, HLD, panic attacks/anxiety, and chronic HFrEF. No previous ischemic work up.   Admitted after PEA arrest.   Assessment/Plan   1. PEA Arrest--> V Fib--> shock  - Witnessed arrest. CPR/EPI/Shock x2 with ROSC Unclear etiology. K 3.2 . TSH ok. HIV Echo recently noted to be  25-30% with WMA + mildly reduced RV, and dilated LA.  - Lactic Acid 2.6 HS Trop 463-->753 .Will need cath once stabilized.  - LFTs elevated suspect in the setting of shock. .   - Will need central line. On Norepi  6 mcg. - Needs Echo  -CT head ordered . Propofol stopped 730 this morning.  -EEG-diffuse slowing. No seizures.  .  2. Acute Hypoxic Respiratory Failure Intubated   3. Acute/Chronic HFrEF Etiology unclear. Echo as noted about EF 25-30% with WMA.  - Will need eventual cath. Severe MR on ECHO possible valvular disease. -Also significant RV pacing.  - POCUS- IVC not dilated. Would hold diuresis  - No room for GDMT . Hold BB.  - Avoid SGLT2i with had cystitis x   4.  A flutter  -Adding amio and on heparin drip.   5 . AKI -Creatinine trending up.   6.  PVCs -Frequent PVCs -Add amio drip.   7.  MR, severe on Echo  Once stabilized will need TEE  8.  H/O CHB--> St Jude PPM  Gen changed 2004, 2012.  Abandoned R side device. L sided device implanted 2021.  Will let EP know about admit.   9. DNR   PICC line unable to be placed. Unable to thread to SVC .  Will need central line.   Length of Stay: 1  Jenne Sellinger, NP  07/13/2022, 9:09 AM  Advanced Heart Failure Team Pager [phone] (M-F; 7a - 5p)  Please contact [facility] for night-coverage after hours (4p -7a ) and weekends on [email or web address]

## 2022-07-13 NOTE — ED Notes (Signed)
Ice pack placed on patient's groin.

## 2022-07-13 NOTE — TOC CM/SW Note (Signed)
TOC CM received referral to locate POA or family, spoke to husband and he is here at the hospital. Waiting for patient to get a room. Attending updated. [place], Heart Failure TOC CM [phone]

## 2022-07-14 DIAGNOSIS — Z9911 Dependence on respirator [ventilator] status: Secondary | ICD-10-CM

## 2022-07-14 DIAGNOSIS — N179 Acute kidney failure, unspecified: Secondary | ICD-10-CM | POA: Diagnosis not present

## 2022-07-14 DIAGNOSIS — I469 Cardiac arrest, cause unspecified: Secondary | ICD-10-CM | POA: Diagnosis not present

## 2022-07-14 DIAGNOSIS — J9601 Acute respiratory failure with hypoxia: Secondary | ICD-10-CM | POA: Diagnosis not present

## 2022-07-14 DIAGNOSIS — G9340 Encephalopathy, unspecified: Secondary | ICD-10-CM

## 2022-07-14 LAB — POCT I-STAT 7, (LYTES, BLD GAS, ICA,H+H)
Acid-base deficit: 1 mmol/L (ref 0.0–2.0)
Bicarbonate: 22.9 mmol/L (ref 20.0–28.0)
Calcium, Ion: 1.16 mmol/L (ref 1.15–1.40)
HCT: 33 % — ABNORMAL LOW (ref 36.0–46.0)
Hemoglobin: 11.2 g/dL — ABNORMAL LOW (ref 12.0–15.0)
O2 Saturation: 99 %
Patient temperature: 36.9
Potassium: 4.1 mmol/L (ref 3.5–5.1)
Sodium: 132 mmol/L — ABNORMAL LOW (ref 135–145)
TCO2: 24 mmol/L (ref 22–32)
pCO2 arterial: 34.7 mmHg (ref 32–48)
pH, Arterial: 7.428 (ref 7.35–7.45)
pO2, Arterial: 119 mmHg — ABNORMAL HIGH (ref 83–108)

## 2022-07-14 LAB — BASIC METABOLIC PANEL
Anion gap: 6 (ref 5–15)
BUN: 18 mg/dL (ref 8–23)
CO2: 23 mmol/L (ref 22–32)
Calcium: 8.2 mg/dL — ABNORMAL LOW (ref 8.9–10.3)
Chloride: 101 mmol/L (ref 98–111)
Creatinine, Ser: 1.12 mg/dL — ABNORMAL HIGH (ref 0.44–1.00)
GFR, Estimated: 52 mL/min — ABNORMAL LOW (ref >60)
Glucose, Bld: 130 mg/dL — ABNORMAL HIGH (ref 70–99)
Potassium: 4.9 mmol/L (ref 3.5–5.1)
Sodium: 130 mmol/L — ABNORMAL LOW (ref 135–145)

## 2022-07-14 LAB — URINALYSIS, ROUTINE W REFLEX MICROSCOPIC
Bilirubin Urine: NEGATIVE
Glucose, UA: NEGATIVE mg/dL
Hgb urine dipstick: NEGATIVE
Ketones, ur: NEGATIVE mg/dL
Leukocytes,Ua: NEGATIVE
Nitrite: NEGATIVE
Protein, ur: 30 mg/dL — AB
Specific Gravity, Urine: 1.028 (ref 1.005–1.030)
pH: 5 (ref 5.0–8.0)

## 2022-07-14 LAB — HEPATIC FUNCTION PANEL
ALT: 369 U/L — ABNORMAL HIGH (ref 0–44)
AST: 251 U/L — ABNORMAL HIGH (ref 15–41)
Albumin: 3.5 g/dL (ref 3.5–5.0)
Alkaline Phosphatase: 61 U/L (ref 38–126)
Bilirubin, Direct: 0.2 mg/dL (ref 0.0–0.2)
Indirect Bilirubin: 0.5 mg/dL (ref 0.3–0.9)
Total Bilirubin: 0.7 mg/dL (ref 0.3–1.2)
Total Protein: 6.2 g/dL — ABNORMAL LOW (ref 6.5–8.1)

## 2022-07-14 LAB — CBC
HCT: 36.8 % (ref 36.0–46.0)
Hemoglobin: 13.1 g/dL (ref 12.0–15.0)
MCH: 33.2 pg (ref 26.0–34.0)
MCHC: 35.6 g/dL (ref 30.0–36.0)
MCV: 93.4 fL (ref 80.0–100.0)
Platelets: 165 10*3/uL (ref 150–400)
RBC: 3.94 MIL/uL (ref 3.87–5.11)
RDW: 11.9 % (ref 11.5–15.5)
WBC: 16.3 10*3/uL — ABNORMAL HIGH (ref 4.0–10.5)
nRBC: 0 % (ref 0.0–0.2)

## 2022-07-14 LAB — HEPARIN LEVEL (UNFRACTIONATED)
Heparin Unfractionated: 0.48 IU/mL (ref 0.30–0.70)
Heparin Unfractionated: 0.34 IU/mL (ref 0.30–0.70)

## 2022-07-14 LAB — GLUCOSE, CAPILLARY
Glucose-Capillary: 101 mg/dL — ABNORMAL HIGH (ref 70–99)
Glucose-Capillary: 106 mg/dL — ABNORMAL HIGH (ref 70–99)
Glucose-Capillary: 119 mg/dL — ABNORMAL HIGH (ref 70–99)
Glucose-Capillary: 134 mg/dL — ABNORMAL HIGH (ref 70–99)
Glucose-Capillary: 178 mg/dL — ABNORMAL HIGH (ref 70–99)
Glucose-Capillary: 86 mg/dL (ref 70–99)
Glucose-Capillary: 93 mg/dL (ref 70–99)

## 2022-07-14 LAB — RESP PANEL BY RT-PCR (RSV, FLU A&B, COVID)  RVPGX2
Influenza A by PCR: NEGATIVE
Influenza B by PCR: NEGATIVE
Resp Syncytial Virus by PCR: NEGATIVE
SARS Coronavirus 2 by RT PCR: NEGATIVE

## 2022-07-14 LAB — AMMONIA: Ammonia: 26 umol/L (ref 9–35)

## 2022-07-14 LAB — COOXEMETRY PANEL
Carboxyhemoglobin: 1.2 % (ref 0.5–1.5)
Methemoglobin: 0.8 % (ref 0.0–1.5)
O2 Saturation: 78 %
Total hemoglobin: 13.3 g/dL (ref 12.0–16.0)

## 2022-07-14 LAB — MAGNESIUM: Magnesium: 2.7 mg/dL — ABNORMAL HIGH (ref 1.7–2.4)

## 2022-07-14 LAB — PHOSPHORUS: Phosphorus: 2.5 mg/dL (ref 2.5–4.6)

## 2022-07-14 LAB — LACTIC ACID, PLASMA
Lactic Acid, Venous: 2.3 mmol/L (ref 0.5–1.9)
Lactic Acid, Venous: 1.8 mmol/L (ref 0.5–1.9)

## 2022-07-14 MED ORDER — PROSOURCE TF20 ENFIT COMPATIBL EN LIQD
60.0000 mL | Freq: Every day | ENTERAL | Status: DC
  Administered 2022-07-14 – 2022-07-23 (×10): 60 mL
  Filled 2022-07-14 (×10): qty 60

## 2022-07-14 MED ORDER — VANCOMYCIN HCL 750 MG/150ML IV SOLN
750.0000 mg | INTRAVENOUS | Status: DC
  Administered 2022-07-15: 750 mg via INTRAVENOUS
  Filled 2022-07-14: qty 150

## 2022-07-14 MED ORDER — SODIUM CHLORIDE 0.9 % IV BOLUS: 250.0000 mL | Freq: Once | INTRAVENOUS | Status: DC

## 2022-07-14 MED ORDER — VITAL 1.5 CAL PO LIQD: 1000.0000 mL | ORAL | Status: DC

## 2022-07-14 MED ORDER — PIPERACILLIN-TAZOBACTAM 3.375 G IVPB
3.3750 g | Freq: Three times a day (TID) | INTRAVENOUS | Status: DC
  Administered 2022-07-14 – 2022-07-15 (×4): 3.375 g via INTRAVENOUS
  Filled 2022-07-14 (×4): qty 50

## 2022-07-14 MED ORDER — NOREPINEPHRINE 16 MG/250ML-% IV SOLN
0.0000 ug/min | INTRAVENOUS | Status: DC
  Administered 2022-07-14: 18 ug/min via INTRAVENOUS
  Filled 2022-07-14: qty 250

## 2022-07-14 MED ORDER — VANCOMYCIN HCL IN DEXTROSE 1-5 GM/200ML-% IV SOLN
1000.0000 mg | Freq: Once | INTRAVENOUS | Status: AC
  Administered 2022-07-14: 1000 mg via INTRAVENOUS
  Filled 2022-07-14: qty 200

## 2022-07-14 MED ORDER — LACTATED RINGERS IV BOLUS
250.0000 mL | Freq: Once | INTRAVENOUS | Status: AC
  Administered 2022-07-14: 250 mL via INTRAVENOUS

## 2022-07-14 MED ORDER — VITAL 1.5 CAL PO LIQD
1000.0000 mL | ORAL | Status: DC
  Administered 2022-07-14 – 2022-07-22 (×7): 1000 mL
  Filled 2022-07-14 (×4): qty 1000

## 2022-07-14 NOTE — Progress Notes (Addendum)
Pharmacy Antibiotic Note  Carmen Green is a 72 y.o. female admitted on 07/12/2022 with sepsis.  Pharmacy has been consulted for vancomycin and zosyn dosing.  S/p cardiac arrest. WBC increasing to 16.3, LA 4.3 on last check. Had fever overnight, now on cooling blanket.   Plan: Zosyn 3.375g IV q8h (4 hour infusion). Vancomycin 1g IV once then 750 mg IV every 24 hours (estAUC 467, Vd 0.72) Monitor cx results, clinical pic, and vanc levels as needed  Height: '5\' 1"'$  (154.9 cm) Weight: 68.7 kg (151 lb 7.3 oz) IBW/kg (Calculated) : 47.8  Temp (24hrs), Avg:98.6 F (37 C), Min:97.3 F (36.3 C), Max:99.9 F (37.7 C)  Recent Labs  Lab 07/12/22 0057 07/12/22 1945 07/12/22 1951 07/12/22 1959 07/13/22 0610 07/13/22 0915 07/13/22 1715 07/14/22 0354  WBC 7.9 12.3*  --   --  12.9*  --  14.8* 16.3*  CREATININE 0.95 1.44* 1.30* 1.30*  --  1.32* 0.99 1.12*  LATICACIDVEN  --   --   --   --  2.6* 4.3*  --   --     Estimated Creatinine Clearance: 39.7 mL/min (A) (by C-G formula based on SCr of 1.12 mg/dL (H)).    Allergies  Allergen Reactions   Zoloft [Sertraline Hcl] Nausea Only        Amoxapine And Related Rash   Cephalexin Rash   Oxycodone     "Loopy"   Penicillins Rash    Antimicrobials this admission: Vancomycin 1/24 >>  Zosyn 1/24 >>   Dose adjustments this admission: N/A  Microbiology results: 1/23 MRSA PCR: neg Bcx 1/24: ordered Ucx 1/24: ordered Trach aspirate 1/24: ordered COVID/Flu PCR 1/24: ordered   Thank you for allowing pharmacy to be a part of this patient's care.  Antonietta Jewel, PharmD, [place] Clinical Pharmacist  Phone: [phone] 07/14/2022 9:47 AM  Please check AMION for all [place] phone numbers After 10:00 PM, call [place] [phone]

## 2022-07-14 NOTE — Progress Notes (Signed)
ANTICOAGULATION CONSULT NOTE - Follow Up Consult  Pharmacy Consult for heparin Indication: atrial fibrillation and suspected obstructive CAD  Labs: Recent Labs    07/12/22 1945 07/12/22 1951 07/12/22 1959 07/12/22 2116 07/12/22 2223 07/12/22 2356 07/13/22 0610 07/13/22 0915 07/13/22 1715 07/13/22 2102 07/14/22 0354  HGB 13.5   < > 13.6   < >  --    < > 13.1  --  13.6  --  13.1  HCT 40.4   < > 40.0   < >  --    < > 38.2  --  39.2  --  36.8  PLT 177  --   --   --   --   --  168  --  155  --  165  HEPARINUNFRC  --   --   --   --   --   --   --  0.23*  --  0.18* 0.48  CREATININE 1.44*   < > 1.30*  --   --   --   --  1.32* 0.99  --   --   TROPONINIHS 463*  --   --   --  753*  --  830*  --   --   --   --    < > = values in this interval not displayed.    Assessment/Plan:  72yo female therapeutic on heparin after rate change. Will continue infusion at current rate of 1050 units/hr and confirm stable with additional level.   Wynona Neat, PharmD, BCPS  07/14/2022,5:09 AM

## 2022-07-14 NOTE — Progress Notes (Signed)
Advanced Heart Failure Rounding Note  PCP-Cardiologist: None   Subjective:   1/23 Cardiac Arrest. Initally on pressor now. NSVT. EP consulted Pacer rate increased to 90.   Remains intubated on propofol.    Objective:   Weight Range: 68.7 kg Body mass index is 28.62 kg/m.   Vital Signs:   Temp:  [97.3 F (36.3 C)-99.9 F (37.7 C)] 98.6 F (37 C) (01/24 0750) Pulse Rate:  [32-96] 91 (01/24 0630) Resp:  [14-37] 18 (01/24 0630) BP: (65-151)/(43-97) 120/71 (01/24 0630) SpO2:  [84 %-100 %] 100 % (01/24 0747) FiO2 (%):  [40 %-50 %] 40 % (01/24 0747) Weight:  [68.7 kg] 68.7 kg (01/24 0500) Last BM Date :  (PTA)  Weight change: Filed Weights   07/12/22 2310 07/14/22 0500  Weight: 62.1 kg 68.7 kg    Intake/Output:   Intake/Output Summary (Last 24 hours) at 07/14/2022 0756 Last data filed at 07/14/2022 0600 Gross per 24 hour  Intake 2177.6 ml  Output 1105 ml  Net 1072.6 ml      Physical Exam   CVP 3-4  General:  Intubated/sedated  HEENT: ETT Neck: supple. no JVD. Carotids 2+ bilat; no bruits. No lymphadenopathy or thryomegaly appreciated. Cor: PMI nondisplaced. Regular rate & rhythm. No rubs, gallops or murmurs. L upper chest PPM R upper chest PPM abandoned Lungs: clear Abdomen: soft, nontender, nondistended. No hepatosplenomegaly. No bruits or masses. Good bowel sounds. Extremities: no cyanosis, clubbing, rash, edema Neuro:Intubated/sedated.   Telemetry   Paced 90s  EKG    N/A  Labs    CBC Recent Labs    07/12/22 0057 07/12/22 1945 07/12/22 1951 07/13/22 1715 07/14/22 0354  WBC 7.9 12.3*   < > 14.8* 16.3*  NEUTROABS 6.4 8.5*  --   --   --   HGB 12.9 13.5   < > 13.6 13.1  HCT 36.9 40.4   < > 39.2 36.8  MCV 94.6 99.3   < > 94.9 93.4  PLT 201 177   < > 155 165   < > = values in this interval not displayed.   Basic Metabolic Panel Recent Labs    07/13/22 0915 07/13/22 1715 07/14/22 0354  NA 133* 131* 130*  K 4.6 3.3* 4.9  CL 100 100 101   CO2 19* 24 23  GLUCOSE 109* 150* 130*  BUN '22 18 18  '$ CREATININE 1.32* 0.99 1.12*  CALCIUM 8.6* 8.2* 8.2*  MG 1.9 1.8 2.7*  PHOS 4.3  --  2.5   Liver Function Tests Recent Labs    07/13/22 0915 07/14/22 0354  AST 620* 251*  ALT 609* 369*  ALKPHOS 77 61  BILITOT 0.7 0.7  PROT 7.1 6.2*  ALBUMIN 4.3 3.5   No results for input(s): "LIPASE", "AMYLASE" in the last 72 hours. Cardiac Enzymes No results for input(s): "CKTOTAL", "CKMB", "CKMBINDEX", "TROPONINI" in the last 72 hours.  BNP: BNP (last 3 results) Recent Labs    07/12/22 0057  BNP 547.6*    ProBNP (last 3 results) Recent Labs    06/28/22 1040  PROBNP 3,230*     D-Dimer No results for input(s): "DDIMER" in the last 72 hours. Hemoglobin A1C No results for input(s): "HGBA1C" in the last 72 hours. Fasting Lipid Panel Recent Labs    07/13/22 0915  TRIG 78   Thyroid Function Tests No results for input(s): "TSH", "T4TOTAL", "T3FREE", "THYROIDAB" in the last 72 hours.  Invalid input(s): "FREET3"  Other results:   Imaging    DG  CHEST PORT 1 VIEW  Result Date: 07/13/2022 CLINICAL DATA:  [id number] Encounter for insertion of tunneled central venous catheter (CVC) with port [id number] EXAM: PORTABLE CHEST 1 VIEW COMPARISON:  Radiograph 07/12/2022 FINDINGS: Endotracheal tube overlies the midthoracic trachea approximately 3.7 cm above the carina. Esophageal temperature probe overlies the mid esophagus. Orogastric tube passes below the diaphragm, tip excluded by collimation. New right neck catheter tip overlies the distal SVC. Unchanged cardiomediastinal silhouette. Unchanged pacemaker leads. There are mild lower lung predominant interstitial and airspace opacities with trace effusions. No evidence of pneumothorax. Unchanged right rib injuries. Chronic posttraumatic deformity of the right proximal humerus. IMPRESSION: New right neck catheter tip overlies the distal superior vena cava. Lower lung predominant interstitial  and airspace opacities with trace effusions, likely due to pulmonary edema. Endotracheal tube overlies the midthoracic trachea approximately 3.7 cm above the carina. Electronically Signed   By: Maurine Simmering M.D.   On: 07/13/2022 16:39   ECHOCARDIOGRAM COMPLETE  Result Date: 07/13/2022    ECHOCARDIOGRAM REPORT   Patient Name:   Carmen Green Date of Exam: 07/13/2022 Medical Rec #:  [id number]        Height:       61.0 in Accession #:    [id number]       Weight:       136.9 lb Date of Birth:  May 24, 1951        BSA:          1.608 m Patient Age:    72 years         BP:           95/67 mmHg Patient Gender: F                HR:           77 bpm. Exam Location:  Inpatient Procedure: 2D Echo, Color Doppler, Cardiac Doppler and Intracardiac            Opacification Agent Indications:    Cardiac arrest I46.9  History:        Patient has prior history of Echocardiogram examinations, most                 recent 07/06/2022. Pacemaker, Mitral Valve Prolapse; Risk                 Factors:Hypertension, Dyslipidemia and GERD.  Sonographer:    Bernadene Person RDCS Referring Phys: [id number] [place]  1. Compared to echo from 07/06/22, LVEF is slightly lower and MR does not appear as severe.  2. Frequent PVCs, couplets, triplets noted during study.. Left ventricular ejection fraction, by estimation, is <20%. The left ventricle has severely decreased function. The left ventricle demonstrates global hypokinesis.  3. Right ventricular systolic function is low normal. The right ventricular size is normal. There is normal pulmonary artery systolic pressure.  4. Left atrial size was severely dilated.  5. Pacer wire present.  6. Mitral regurgitation is eccentric, directed more posterior Ther is an echo bright density along subvalvular surface May represtne calcified chordae (image 68 ). Moderate mitral valve regurgitation.  7. The aortic valve is tricuspid. Aortic valve regurgitation is not visualized.  8. The inferior vena  cava is normal in size with greater than 50% respiratory variability, suggesting right atrial pressure of 3 mmHg. FINDINGS  Left Ventricle: Frequent PVCs, couplets, triplets noted during study. Left ventricular ejection fraction, by estimation, is <20%. The left ventricle has severely decreased function. The left ventricle demonstrates global  hypokinesis. Definity contrast agent was given IV to delineate the left ventricular endocardial borders. The left ventricular internal cavity size was normal in size. There is no left ventricular hypertrophy. Right Ventricle: The right ventricular size is normal. Right vetricular wall thickness was not assessed. Right ventricular systolic function is low normal. There is normal pulmonary artery systolic pressure. The tricuspid regurgitant velocity is 2.43 m/s, and with an assumed right atrial pressure of 3 mmHg, the estimated right ventricular systolic pressure is 26.7 mmHg. Left Atrium: Left atrial size was severely dilated. Right Atrium: Pacer wire present. Right atrial size was normal in size. Pericardium: There is no evidence of pericardial effusion. Mitral Valve: Mitral regurgitation is eccentric, directed more posterior Ther is an echo bright density along subvalvular surface May represtne calcified chordae (image 68 ). There is mild thickening of the mitral valve leaflet(s). Mild mitral annular calcification. Moderate mitral valve regurgitation. Tricuspid Valve: The tricuspid valve is normal in structure. Tricuspid valve regurgitation is mild. Aortic Valve: The aortic valve is tricuspid. Aortic valve regurgitation is not visualized. Pulmonic Valve: The pulmonic valve was not well visualized. Pulmonic valve regurgitation is trivial. No evidence of pulmonic stenosis. Aorta: The aortic root is normal in size and structure. Venous: The inferior vena cava is normal in size with greater than 50% respiratory variability, suggesting right atrial pressure of 3 mmHg. IAS/Shunts:  No atrial level shunt detected by color flow Doppler.  LEFT VENTRICLE PLAX 2D LVIDd:         4.80 cm LVIDs:         4.00 cm LV PW:         1.00 cm LV IVS:        1.10 cm LVOT diam:     2.00 cm LV SV:         49 LV SV Index:   31 LVOT Area:     3.14 cm  LV Volumes (MOD) LV vol d, MOD A2C: 118.0 ml LV vol d, MOD A4C: 154.0 ml LV vol s, MOD A2C: 101.0 ml LV vol s, MOD A4C: 116.0 ml LV SV MOD A2C:     17.0 ml LV SV MOD A4C:     154.0 ml LV SV MOD BP:      26.3 ml RIGHT VENTRICLE TAPSE (M-mode): 1.9 cm LEFT ATRIUM             Index        RIGHT ATRIUM           Index LA diam:        4.20 cm 2.61 cm/m   RA Area:     15.30 cm LA Vol (A2C):   95.0 ml 59.09 ml/m  RA Volume:   41.00 ml  25.50 ml/m LA Vol (A4C):   90.6 ml 56.35 ml/m LA Biplane Vol: 95.6 ml 59.46 ml/m  AORTIC VALVE LVOT Vmax:   105.83 cm/s LVOT Vmean:  68.333 cm/s LVOT VTI:    0.156 m  AORTA Ao Root diam: 3.30 cm Ao Asc diam:  3.10 cm MR Peak grad:    89.9 mmHg    TRICUSPID VALVE MR Mean grad:    60.0 mmHg    TR Peak grad:   23.6 mmHg MR Vmax:         474.00 cm/s  TR Vmax:        243.00 cm/s MR Vmean:        368.0 cm/s MR PISA:         1.01  cm     SHUNTS MR PISA Eff ROA: 8 mm        Systemic VTI:  0.16 m MR PISA Radius:  0.40 cm      Systemic Diam: 2.00 cm Dorris Carnes MD Electronically signed by Dorris Carnes MD Signature Date/Time: 07/13/2022/3:05:27 PM    Final    CT HEAD WO CONTRAST (5MM)  Result Date: 07/13/2022 CLINICAL DATA:  Mental status change. EXAM: CT HEAD WITHOUT CONTRAST TECHNIQUE: Contiguous axial images were obtained from the base of the skull through the vertex without intravenous contrast. RADIATION DOSE REDUCTION: This exam was performed according to the departmental dose-optimization program which includes automated exposure control, adjustment of the mA and/or kV according to patient size and/or use of iterative reconstruction technique. COMPARISON:  None Available. FINDINGS: Brain: No evidence of acute infarction, hemorrhage,  hydrocephalus, extra-axial collection or mass lesion/mass effect. Vascular: No hyperdense vessel or unexpected calcification. Skull: Normal. Negative for fracture or focal lesion. Sinuses/Orbits: No acute finding. IMPRESSION: No acute intracranial process. Electronically Signed   By: Sammie Bench M.D.   On: 07/13/2022 12:49   [place] EKG SITE RITE  Result Date: 07/13/2022 If Site Rite image not attached, placement could not be confirmed due to current cardiac rhythm.  EEG adult  Result Date: 07/13/2022 Lora Havens, MD     07/13/2022  8:31 AM Patient Name: Carmen Green MRN: [id number] Epilepsy Attending: Lora Havens Referring Physician/Provider: Corey Harold, NP Date: 07/12/2022 Duration: 21.13 mins Patient history: 72 year old female status post cardiac arrest.  EEG to evaluate for seizure. Level of alertness: comatose AEDs during EEG study: Propofol Technical aspects: This EEG study was done with scalp electrodes positioned according to the 10-20 International system of electrode placement. Electrical activity was reviewed with band pass filter of 1-'70Hz'$ , sensitivity of 7 uV/mm, display speed of 61m/sec with a '60Hz'$  notched filter applied as appropriate. EEG data were recorded continuously and digitally stored.  Video monitoring was available and reviewed as appropriate. Description: EEG showed continuous generalized background suppression which was not reactive to tactile stimulation.  Hyperventilation and photic stimulation were not performed.    ABNORMALITY -Background suppression, generalized  IMPRESSION: This study is suggestive of profound diffuse encephalopathy, nonspecific etiology. No seizures or epileptiform discharges were seen throughout the recording.  Priyanka OBarbra Sarks    Medications:     Scheduled Medications:  aspirin  324 mg Per Tube Daily   aspirin  81 mg Per Tube Daily   Chlorhexidine Gluconate Cloth  6 each Topical Daily   docusate  100 mg Per Tube BID    famotidine  20 mg Per Tube BID   mouth rinse  15 mL Mouth Rinse Q2H   polyethylene glycol  17 g Per Tube Daily    Infusions:  sodium chloride Stopped (07/13/22 1748)   sodium chloride     amiodarone 30 mg/hr (07/14/22 0600)   heparin 1,050 Units/hr (07/14/22 0749)   norepinephrine (LEVOPHED) Adult infusion 6 mcg/min (07/14/22 0600)   propofol (DIPRIVAN) infusion 15 mcg/kg/min (07/14/22 0600)    PRN Medications: sodium chloride, acetaminophen **OR** acetaminophen (TYLENOL) oral liquid 160 mg/5 mL **OR** acetaminophen, fentaNYL (SUBLIMAZE) injection, fentaNYL (SUBLIMAZE) injection, ondansetron (ZOFRAN) IV, mouth rinse    Patient Profile     72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  Assessment/Plan   1. PEA Arrest--> V Fib--> shock  -  Witnessed arrest. CPR/EPI/Shock x2 with ROSC Unclear etiology/ Suspect ischemic event.  TSH ok. HIV NR  Echo 06/1622 recently noted to be  25-30% with WMA + mildly reduced RV, and dilated LA.  - Lactic Acid 2.6>4.3  - HS Trop 463-->753 .Will need cath once stabilized  - LFTs trending down.  elevated suspect in the setting of shock. .   - CT head - negative  -EEG-diffuse slowing. No seizures.  - Off Norepi. Maps stable. CO-OX 78% - Concern for possible aspiration/sepsis during arrest. Add LR 250 cc x1.  - Add vanc and zosyn.  .  2. Acute Hypoxic Respiratory Failure Intubated per CCM    3. Acute/Chronic HFrEF Etiology unclear. Echo as noted about EF 25-30% with WMA.  - Will need eventual cath. Severe MR on ECHO possible valvular disease. -Also significant RV pacing.  - POCUS- IVC not dilated.  - Does not appear volume overloaded CVP low.  Given 250 cc LF now.  - No room for GDMT . Hold BB.  - Avoid SGLT2i with cystitis 2 occasions last year.    4.  A flutter  - Continue amio drip.  - Continue heparin drip.    5 . AKI -Follow daily.     6.  PVCs -Frequent PVCs -Continue amio drip.    7.  MR, severe on Echo  Once stabilized will need TEE   8.  H/O CHB--> St Jude PPM  Gen changed 2004, 2012.  Abandoned R side device. L sided device implanted 2021.  EP appreciated. Pacer rate increased to 90.    9.ID:  WBC trending up 16.3  Add vanc and zosyn.   10.  DNR    Length of Stay: 2  Punam Broussard, NP  07/14/2022, 7:56 AM  Advanced Heart Failure Team Pager [phone] (M-F; 7a - 5p)  Please contact [facility] for night-coverage after hours (5p -7a ) and weekends on [email or web address] \

## 2022-07-14 NOTE — Progress Notes (Signed)
ANTICOAGULATION CONSULT NOTE  Pharmacy Consult for heparin infusion Indication:  Afib with possible ACS  Allergies  Allergen Reactions   Zoloft [Sertraline Hcl] Nausea Only        Amoxapine And Related Rash   Cephalexin Rash   Oxycodone     "Loopy"   Penicillins Rash    Patient Measurements: Height: '5\' 1"'$  (154.9 cm) Weight: 68.7 kg (151 lb 7.3 oz) IBW/kg (Calculated) : 47.8 Heparin Dosing Weight: 60kg  Vital Signs: Temp: 98.8 F (37.1 C) (01/24 1300) Temp Source: Esophageal (01/24 0400) BP: 108/61 (01/24 1300) Pulse Rate: 92 (01/24 1300)  Labs: Recent Labs    07/12/22 1945 07/12/22 1951 07/12/22 1959 07/12/22 2223 07/12/22 2356 07/13/22 0610 07/13/22 0915 07/13/22 1715 07/13/22 2102 07/14/22 0354 07/14/22 1100  HGB 13.5   < >  --   --    < > 13.1  --  13.6  --  13.1  --   HCT 40.4   < >  --   --    < > 38.2  --  39.2  --  36.8  --   PLT 177  --   --   --   --  168  --  155  --  165  --   HEPARINUNFRC  --   --    < >  --   --   --  0.23*  --  0.18* 0.48 0.34  CREATININE 1.44*   < >  --   --   --   --  1.32* 0.99  --  1.12*  --   TROPONINIHS 463*  --   --  753*  --  830*  --   --   --   --   --    < > = values in this interval not displayed.     Estimated Creatinine Clearance: 39.7 mL/min (A) (by C-G formula based on SCr of 1.12 mg/dL (H)).   Assessment: 72yo female had witnessed arrest at home, required four rounds and shock x2 before ROSC, to begin heparin for Afib and possible ACS.  Confirmatory heparin level is therapeutic at 0.34, CBC stable.  Goal of Therapy:  Heparin level 0.3-0.7 units/ml Monitor platelets by anticoagulation protocol: Yes   Plan:  Continue heparin 1050 units/h Daily heparin level and CBC  Carmen Green, PharmD, [place], [facility] Clinical Pharmacist [phone] Please check AMION for all [facility] Pharmacy numbers 07/14/2022

## 2022-07-14 NOTE — Progress Notes (Signed)
Rounding Note    Patient Name: Carmen Green Date of Encounter: 07/14/2022  [place] Cardiologist: previously Dr. Rayann Heman >>> will transition to Dr. Quentin Ore  Subjective   Intubated, no purposeful movements with reduction in sedation  Inpatient Medications    Scheduled Meds:  aspirin  324 mg Per Tube Daily   aspirin  81 mg Per Tube Daily   Chlorhexidine Gluconate Cloth  6 each Topical Daily   docusate  100 mg Per Tube BID   famotidine  20 mg Per Tube BID   mouth rinse  15 mL Mouth Rinse Q2H   polyethylene glycol  17 g Per Tube Daily   Continuous Infusions:  sodium chloride Stopped (07/13/22 1748)   sodium chloride     amiodarone 30 mg/hr (07/14/22 0600)   heparin 1,050 Units/hr (07/14/22 0749)   lactated ringers     norepinephrine (LEVOPHED) Adult infusion 6 mcg/min (07/14/22 0600)   piperacillin-tazobactam (ZOSYN)  IV     propofol (DIPRIVAN) infusion 15 mcg/kg/min (07/14/22 0600)   vancomycin     PRN Meds: sodium chloride, acetaminophen **OR** acetaminophen (TYLENOL) oral liquid 160 mg/5 mL **OR** acetaminophen, fentaNYL (SUBLIMAZE) injection, fentaNYL (SUBLIMAZE) injection, ondansetron (ZOFRAN) IV, mouth rinse   Vital Signs    Vitals:   07/14/22 0630 07/14/22 0747 07/14/22 0749 07/14/22 0750  BP: 120/71     Pulse: 91     Resp: 18     Temp:   98.6 F (37 C) 98.6 F (37 C)  TempSrc:      SpO2: 100% 100%    Weight:      Height:        Intake/Output Summary (Last 24 hours) at 07/14/2022 0814 Last data filed at 07/14/2022 0600 Gross per 24 hour  Intake 2177.6 ml  Output 1105 ml  Net 1072.6 ml      07/14/2022    5:00 AM 07/12/2022   11:10 PM 07/12/2022    8:48 AM  Last 3 Weights  Weight (lbs) 151 lb 7.3 oz 136 lb 14.5 oz 137 lb  Weight (kg) 68.7 kg 62.1 kg 62.143 kg      Telemetry    AV pacing - Personally Reviewed  ECG    No new EKGs - Personally Reviewed  Physical Exam   GEN: intubated, eyes open, not responsive.   Neck: No  JVD Cardiac: RRR, 2/6 SM, no rubs, or gallops.  Respiratory: CTA b/l (anterior/lateral auscultation only). GI: Soft, non-distended  MS: No edema; No deformity, thin body habitus Neuro: unable to assess, in d/w RN, with reduction in sedation, no purposful movements and some posturing Psych: unable to assess  Labs    High Sensitivity Troponin:   Recent Labs  Lab 07/12/22 0057 07/12/22 0248 07/12/22 1945 07/12/22 2223 07/13/22 0610  TROPONINIHS 19* 23* 463* 753* 830*     Chemistry Recent Labs  Lab 07/12/22 1945 07/12/22 1951 07/13/22 0915 07/13/22 1715 07/14/22 0354  NA 130*   < > 133* 131* 130*  K 3.2*   < > 4.6 3.3* 4.9  CL 100   < > 100 100 101  CO2 17*  --  19* 24 23  GLUCOSE 198*   < > 109* 150* 130*  BUN 16   < > '22 18 18  '$ CREATININE 1.44*   < > 1.32* 0.99 1.12*  CALCIUM 8.0*  --  8.6* 8.2* 8.2*  MG  --   --  1.9 1.8 2.7*  PROT 6.4*  --  7.1  --  6.2*  ALBUMIN 3.7  --  4.3  --  3.5  AST 717*  --  620*  --  251*  ALT 627*  --  609*  --  369*  ALKPHOS 77  --  77  --  61  BILITOT 0.9  --  0.7  --  0.7  GFRNONAA 38*  --  43* >60 52*  ANIONGAP 13  --  '14 7 6   '$ < > = values in this interval not displayed.    Lipids  Recent Labs  Lab 07/13/22 0915  TRIG 78    Hematology Recent Labs  Lab 07/13/22 0610 07/13/22 1715 07/14/22 0354  WBC 12.9* 14.8* 16.3*  RBC 3.97 4.13 3.94  HGB 13.1 13.6 13.1  HCT 38.2 39.2 36.8  MCV 96.2 94.9 93.4  MCH 33.0 32.9 33.2  MCHC 34.3 34.7 35.6  RDW 11.8 11.9 11.9  PLT 168 155 165   Thyroid No results for input(s): "TSH", "FREET4" in the last 168 hours.  BNP Recent Labs  Lab 07/12/22 0057  BNP 547.6*    DDimer No results for input(s): "DDIMER" in the last 168 hours.   Radiology      Cardiac Studies   07/13/22: TTE  1. Compared to echo from 07/06/22, LVEF is slightly lower and MR does not  appear as severe.   2. Frequent PVCs, couplets, triplets noted during study.. Left  ventricular ejection fraction, by  estimation, is <20%. The left ventricle  has severely decreased function. The left ventricle demonstrates global  hypokinesis.   3. Right ventricular systolic function is low normal. The right  ventricular size is normal. There is normal pulmonary artery systolic  pressure.   4. Left atrial size was severely dilated.   5. Pacer wire present.   6. Mitral regurgitation is eccentric, directed more posterior Ther is an  echo bright density along subvalvular surface May represtne calcified  chordae (image 68 ). Moderate mitral valve regurgitation.   7. The aortic valve is tricuspid. Aortic valve regurgitation is not  visualized.   8. The inferior vena cava is normal in size with greater than 50%  respiratory variability, suggesting right atrial pressure of 3 mmHg.      07/06/22: TTE 1. Left ventricular ejection fraction, by estimation, is 25 to 30%. The  left ventricle has severely decreased function. The left ventricle  demonstrates global hypokinesis. The left ventricular internal cavity size  was mildly dilated. There is mild  asymmetric left ventricular hypertrophy of the basal-septal segment. Left  ventricular diastolic parameters are consistent with Grade II diastolic  dysfunction (pseudonormalization).   2. Right ventricular systolic function is mildly reduced. The right  ventricular size is normal. There is moderately elevated pulmonary artery  systolic pressure. The estimated right ventricular systolic pressure is  47.8 mmHg.   3. Left atrial size was severely dilated.   4. The mitral valve is abnormal, the valve is calcified and posterior  leaflet appears restricted. Severe mitral valve regurgitation. No evidence  of mitral stenosis.   5. Tricuspid valve regurgitation is mild to moderate.   6. The aortic valve is tricuspid. Aortic valve regurgitation is not  visualized. No aortic stenosis is present.   7. The inferior vena cava is normal in size with greater than 50%   respiratory variability, suggesting right atrial pressure of 3 mmHg.   8. Function appears significant worse than prior echo, consider CHF  clinic referral.      08/17/19: TTE  1. Left  ventricular ejection fraction, by estimation, is 50 to 55%. The  left ventricle has low normal function. The left ventricle has no regional  wall motion abnormalities. The left ventricular internal cavity size was  mildly dilated. There is mild  asymmetric left ventricular hypertrophy of the basal-septal segment. Left  ventricular diastolic parameters are indeterminate.   2. Right ventricular systolic function is normal. The right ventricular  size is normal. There is mildly elevated pulmonary artery systolic  pressure. The estimated right ventricular systolic pressure is 84.1 mmHg.   3. Mild mitral valve prolapse, anterior leaflet > posterior leaflet with  anterior leaflet override and mild-moderate, posteriorly directed mitral  regurgitation. The mitral valve is abnormal. Mild to moderate mitral valve  regurgitation. No evidence of  mitral stenosis.   4. Tricuspid valve regurgitation is mild to moderate.   5. The aortic valve is abnormal. Aortic valve regurgitation is not  visualized. No aortic stenosis is present.   6. The inferior vena cava is normal in size with greater than 50%  respiratory variability, suggesting right atrial pressure of 3 mmHg.   Comparison(s): A prior study was performed on 08/11/2017. Prior images  reviewed side by side. No significant change in LV function. Mitral  regurgitation has increased.     Patient Profile     72 y.o. female  with a hx of CHB w/PPM, PVCs, HTN, new CM and MR admitted with cardiac arrest  Assessment & Plan    Cardiac arrest New CM New MR  Down-time is unclear prior to BLS arrival EMS report: BLS arrival w/CPR 18:19 > 1st BP recorded 18:48  Fever last night > cooling blanket for this to normothermia  Remains on levophed Amiodarone at  30 Heparin gtt  Pacing at 90 with marked reduction in V ectopy, no VT  Pending clinical course/neuro recovery, eventual cath    For questions or updates, please contact [place] Please consult [email or web address] for contact info under        Signed, Baldwin [place], PA-C  07/14/2022, 8:14 AM

## 2022-07-14 NOTE — Progress Notes (Signed)
Initial Nutrition Assessment  DOCUMENTATION CODES:   Not applicable  INTERVENTION:   Tube Feeding via OG:  Vital 1.5 at 45 ml/hr Begin TF at 20 ml/hr; titrate by 10 mL q 8 hours until goal rate of 45 ml/hr Pro-Source TF20 60 mL  This provides 1700 kcals, 93 g of protein and 730 mL of free water  Additional kcals from lipid being provided via propofol  NUTRITION DIAGNOSIS:   Inadequate oral intake related to acute illness as evidenced by NPO status.  GOAL:   Patient will meet greater than or equal to 90% of their needs   MONITOR:   Vent status, Labs, Weight trends  REASON FOR ASSESSMENT:   Ventilator    ASSESSMENT:   72 yo admitted post OOH arrest requiring intubation, acute on chronic CHF, shock, AKI. PMH includes CHF, CHB s/p PPM, HTN, HLD  Pt remains on vent support, off pressors, popofol for sedation TTM normothermia Mentation precludes extubation at this time  OG tube in good position for chest xray report.   Weight up to 68.7 kg; admit weight 62.1 kg. Net + for admission. Noted UOP only 615 mL in 24 hours.   Unable to obtain diet and weight history from patient at this time  Labs: sodium 130, Creatinine 1.12 Meds: reviewed   NUTRITION - FOCUSED PHYSICAL EXAM: Limited exam given TTM pads in place  [employer] Most Recent Value  Orbital Region No depletion  Upper Arm Region No depletion  Thoracic and Lumbar Region Unable to assess  Buccal Region Unable to assess  Temple Region No depletion  Clavicle Bone Region No depletion  Clavicle and Acromion Bone Region No depletion  Scapular Bone Region No depletion  Dorsal Hand Unable to assess  Patellar Region Unable to assess  Anterior Thigh Region Unable to assess  Posterior Calf Region No depletion  Edema (RD Assessment) Mild       Diet Order:   Diet Order     None       EDUCATION NEEDS:   Not appropriate for education at this time  Skin:  Skin Assessment: Reviewed RN  Assessment  Last BM:  PTA  Height:   Ht Readings from Last 1 Encounters:  07/12/22 '5\' 1"'$  (1.549 m)    Weight:   Wt Readings from Last 1 Encounters:  07/14/22 68.7 kg    BMI:  Body mass index is 28.62 kg/m.  Estimated Nutritional Needs:   Kcal:  1500-1700 kcals  Protein:  75-95 g  Fluid:  >/= 1.5 L    Kerman Passey MS, RDN, LDN, CNSC Registered Dietitian 3 Clinical Nutrition RD Pager and On-Call Pager Number Located in [place]

## 2022-07-14 NOTE — Progress Notes (Addendum)
NAME:  Carmen Green, MRN:  [id number], DOB:  1950/09/06, LOS: 2 ADMISSION DATE:  07/12/2022, CONSULTATION DATE: 1/22 REFERRING MD: Dr. Darl Householder EDP, CHIEF COMPLAINT: Cardiac arrest  History of Present Illness:  72 year old female with past medical history as below, which is significant for complete heart block status post pacemaker, mitral valve prolapse, GERD, and esophagitis.  She is also recently been undergoing additional cardiac workup after presenting to the cardiology office on 1/8 with new complaints of shortness of breath and fatigue.  Echocardiogram done 1/16 demonstrated newly reduced LVEF to 25% with severe mitral regurgitation.  There were plans for her to undergo TEE and left/right heart cath, however, she unfortunately presented to [facility] emergency department on 1/22 after suffering cardiac arrest at home at approximately 1830.  Bystanders report she fell to the ground.  Sounds like she did receive bystander CPR and first responders placed AED which did not advise shock.  After EMS arrived and placed cardiac monitor she was in PEA.  Rhythm did eventually changed to V-fib at which point she was defibrillated and ROSC was achieved.  King airway was exchanged for endotracheal tube in the emergency department.  Patient was hypotensive requiring norepinephrine infusion.  EKG with nonspecific possibly ischemic changes.  Cardiology was consulted by the EDP.  PCCM has been asked to admit.  Pertinent  Medical History   has a past medical history of Allergy, Anemia, Anxiety, Arthritis, Cataract, Colon polyp, Complete heart block ([place]), Contusion of chest wall (10/10/2007), Depression, Diverticulitis, Esophagitis, ESOPHAGITIS (06/25/2008), GERD (gastroesophageal reflux disease), H/O seasonal allergies, Heart murmur, Hiatal hernia, Hyperlipidemia, Hypertension, Mitral valve prolapse, and Pacemaker.   Significant Hospital Events: Including procedures, antibiotic start and stop dates in addition to  other pertinent events   1/23 Admission following cardiac arrest; EP consulted for runs of ventricular ectopy and turned pacer up to 90 with significant improvement; Echo LVEF <20% w/ severe MR 1/24 off levo; WBC trending up and tmax 99.3, cooling per TTM protocol; cultures sent and started on broad spectrum abx  Interim History / Subjective:   Off pressors Intubated on mech vent On 25 mcg prop; per nurse has cough/gag; posturing appreciated; not following commands; PERRL CVP 4, coox 78%, UOP 615 last 24 hours WBC trending up, Tmax 99, cooling per TTM protocol  Objective   Blood pressure (!) 102/57, pulse 86, temperature 98.6 F (37 C), resp. rate 16, height '5\' 1"'$  (1.549 m), weight 68.7 kg, SpO2 100 %. CVP:  [2 mmHg-12 mmHg] 4 mmHg  Vent Mode: PRVC FiO2 (%):  [40 %-50 %] 40 % Set Rate:  [22 bmp] 22 bmp Vt Set:  [380 mL] 380 mL PEEP:  [5 cmH20] 5 cmH20 Plateau Pressure:  [14 cmH20-16 cmH20] 14 cmH20   Intake/Output Summary (Last 24 hours) at 07/14/2022 0935 Last data filed at 07/14/2022 0839 Gross per 24 hour  Intake 2275.65 ml  Output 1105 ml  Net 1170.65 ml    Filed Weights   07/12/22 2310 07/14/22 0500  Weight: 62.1 kg 68.7 kg    Examination: General: critically ill appearing on mech vent HEENT: MM pink/moist; ETT in place Neuro: On 25 mcg prop; per nurse has cough/gag; posturing appreciated; not following commands; PERRL CV: s1s2, paced 59Y, systolic murmur appreciated; RU and LU pacemaker appreciated PULM:  dim clear BS bilaterally; on mech vent PRVC GI: soft, bsx4 active  Extremities: warm/dry, no edema  Skin: no rashes or lesions   Resolved Hospital Problem list     Assessment &  Plan:   OOH arrest: Initially PEA then vfib which was shocked. ROSC? Suspect in setting that was cardiac in nature given recent history and sudden collapse.  EKG in the ED with potential signs of ischemia, however, in the immediate postarrest setting the significance is  uncertain. Plan: -Cards and EP following; appreciate recs -continue pacing at 90 -telemetry monitoring -TTM; continue cooling for normothermia -continue heparin for ischemia and a flutter -cath when stable  Acute hypoxemic respiratory failure: pulmonary edema Plan: -spontaneously breathing but will not extubate given poor mental status -LTVV strategy with tidal volumes of 6-8 cc/kg ideal body weight -Wean PEEP/FiO2 for SpO2 >92% -VAP bundle in place -Daily SAT and SBT -PAD protocol in place -wean sedation for RASS goal 0 to -1 -Follow intermittent CXR and ABG PRN  Acute encephalopathy: concern for anoxic encephalopathy post arrest -CT head no acute abnormality; EEG no seizures  Plan: -limit sedating meds -sedation RASS 0 to -1 -check ammonia -consider MRI 48-72 hours post arrest  Shock: cardiogenic with possible sepsis Acute on chronic HFrEF: echo lvef <20% w/ MR Severe MR Atrial flutter Frequent PVCs CHB w/ pacemaker Plan: -Cards/EP following appreciate recs -off pressors; map goal >65 -WBC/Fever trending up; panculture; started on zosyn and vanc -trend cvp/coox; hold lasix this am; given gentle iv fluids -cont heparin and amio gtt -hold GDMT while bp soft -TEE when stable  Prolonged QTC Plan: -tele -trend bmp/mag -k goal >4; mag >2 -avoid qtc prolonging agents  R rib fx: s/p CPR Plan: -supportive care  AKI Hypokalemia Plan: -Trend BMP / urinary output -Replace electrolytes as indicated -Avoid nephrotoxic agents, ensure adequate renal perfusion  Elevated LFTs: trending down Plan: -trend cmp -hold home statin  HTN HLD Plan: -hold statin while LFTs elevated -hold anti-hypertensives   Anxiety Depression Plan: -hold home meds  Best Practice (right click and "Reselect all SmartList Selections" daily)   Diet/type: NPO DVT prophylaxis: systemic heparin GI prophylaxis: H2B Lines: Central line Foley:  Yes, and it is still needed Code Status:   DNR Last date of multidisciplinary goals of care discussion [1/24 updated husband at bedside]  Labs   CBC: Recent Labs  Lab 07/12/22 0057 07/12/22 1945 07/12/22 1951 07/12/22 2116 07/12/22 2356 07/13/22 0610 07/13/22 1715 07/14/22 0354  WBC 7.9 12.3*  --   --   --  12.9* 14.8* 16.3*  NEUTROABS 6.4 8.5*  --   --   --   --   --   --   HGB 12.9 13.5   < > 13.6 13.3 13.1 13.6 13.1  HCT 36.9 40.4   < > 40.0 39.0 38.2 39.2 36.8  MCV 94.6 99.3  --   --   --  96.2 94.9 93.4  PLT 201 177  --   --   --  168 155 165   < > = values in this interval not displayed.     Basic Metabolic Panel: Recent Labs  Lab 07/12/22 0057 07/12/22 1945 07/12/22 1951 07/12/22 1959 07/12/22 2116 07/12/22 2356 07/13/22 0915 07/13/22 1715 07/14/22 0354  NA 127* 130* 135 135 135 129* 133* 131* 130*  K 3.7 3.2* 3.3* 3.2* 3.7 5.3* 4.6 3.3* 4.9  CL 96* 100 100 100  --   --  100 100 101  CO2 22 17*  --   --   --   --  19* 24 23  GLUCOSE 173* 198* 197* 196*  --   --  109* 150* 130*  BUN 11 16 19  18  --   --  '22 18 18  '$ CREATININE 0.95 1.44* 1.30* 1.30*  --   --  1.32* 0.99 1.12*  CALCIUM 9.0 8.0*  --   --   --   --  8.6* 8.2* 8.2*  MG  --   --   --   --   --   --  1.9 1.8 2.7*  PHOS  --   --   --   --   --   --  4.3  --  2.5    GFR: Estimated Creatinine Clearance: 39.7 mL/min (A) (by C-G formula based on SCr of 1.12 mg/dL (H)). Recent Labs  Lab 07/12/22 1945 07/13/22 0610 07/13/22 0915 07/13/22 1715 07/14/22 0354  WBC 12.3* 12.9*  --  14.8* 16.3*  LATICACIDVEN  --  2.6* 4.3*  --   --      Liver Function Tests: Recent Labs  Lab 07/12/22 0057 07/12/22 1945 07/13/22 0915 07/14/22 0354  AST 26 717* 620* 251*  ALT 16 627* 609* 369*  ALKPHOS 53 77 77 61  BILITOT 0.6 0.9 0.7 0.7  PROT 6.7 6.4* 7.1 6.2*  ALBUMIN 3.9 3.7 4.3 3.5    No results for input(s): "LIPASE", "AMYLASE" in the last 168 hours. No results for input(s): "AMMONIA" in the last 168 hours.  ABG    Component Value  Date/Time   PHART 7.264 (L) 07/12/2022 2356   PCO2ART 40.5 07/12/2022 2356   PO2ART 100 07/12/2022 2356   HCO3 18.3 (L) 07/12/2022 2356   TCO2 20 (L) 07/12/2022 2356   ACIDBASEDEF 8.0 (H) 07/12/2022 2356   O2SAT 78 07/14/2022 0354     Coagulation Profile: No results for input(s): "INR", "PROTIME" in the last 168 hours.  Cardiac Enzymes: No results for input(s): "CKTOTAL", "CKMB", "CKMBINDEX", "TROPONINI" in the last 168 hours.  HbA1C: No results found for: "HGBA1C"  CBG: Recent Labs  Lab 07/13/22 1415 07/13/22 2101 07/14/22 0048 07/14/22 0353 07/14/22 0739  GLUCAP 94 164* 178* 134* 86      Critical care time: 35 minutes     JD Rexene [facility] 07/14/2022, 10:25 AM  Please see [email or web address] for pager details.  From 7A-7P if no response, please call [phone]. After hours, please call ELink [phone].

## 2022-07-15 DIAGNOSIS — J1569 Pneumonia due to other gram-negative bacteria: Secondary | ICD-10-CM | POA: Diagnosis not present

## 2022-07-15 DIAGNOSIS — J9601 Acute respiratory failure with hypoxia: Secondary | ICD-10-CM | POA: Diagnosis not present

## 2022-07-15 DIAGNOSIS — Z9911 Dependence on respirator [ventilator] status: Secondary | ICD-10-CM | POA: Diagnosis not present

## 2022-07-15 DIAGNOSIS — I469 Cardiac arrest, cause unspecified: Secondary | ICD-10-CM | POA: Diagnosis not present

## 2022-07-15 LAB — HEPATIC FUNCTION PANEL
ALT: 234 U/L — ABNORMAL HIGH (ref 0–44)
AST: 146 U/L — ABNORMAL HIGH (ref 15–41)
Albumin: 3.1 g/dL — ABNORMAL LOW (ref 3.5–5.0)
Alkaline Phosphatase: 53 U/L (ref 38–126)
Bilirubin, Direct: 0.3 mg/dL — ABNORMAL HIGH (ref 0.0–0.2)
Indirect Bilirubin: 0.8 mg/dL (ref 0.3–0.9)
Total Bilirubin: 1.1 mg/dL (ref 0.3–1.2)
Total Protein: 5.8 g/dL — ABNORMAL LOW (ref 6.5–8.1)

## 2022-07-15 LAB — CBC
HCT: 33 % — ABNORMAL LOW (ref 36.0–46.0)
Hemoglobin: 11.4 g/dL — ABNORMAL LOW (ref 12.0–15.0)
MCH: 33 pg (ref 26.0–34.0)
MCHC: 34.5 g/dL (ref 30.0–36.0)
MCV: 95.7 fL (ref 80.0–100.0)
Platelets: 109 10*3/uL — ABNORMAL LOW (ref 150–400)
RBC: 3.45 MIL/uL — ABNORMAL LOW (ref 3.87–5.11)
RDW: 12.1 % (ref 11.5–15.5)
WBC: 11.2 10*3/uL — ABNORMAL HIGH (ref 4.0–10.5)
nRBC: 0 % (ref 0.0–0.2)

## 2022-07-15 LAB — HEPARIN LEVEL (UNFRACTIONATED)
Heparin Unfractionated: 0.15 IU/mL — ABNORMAL LOW (ref 0.30–0.70)
Heparin Unfractionated: 0.26 IU/mL — ABNORMAL LOW (ref 0.30–0.70)

## 2022-07-15 LAB — URINE CULTURE: Culture: NO GROWTH

## 2022-07-15 LAB — BASIC METABOLIC PANEL
Anion gap: 11 (ref 5–15)
BUN: 22 mg/dL (ref 8–23)
CO2: 23 mmol/L (ref 22–32)
Calcium: 8.5 mg/dL — ABNORMAL LOW (ref 8.9–10.3)
Chloride: 98 mmol/L (ref 98–111)
Creatinine, Ser: 0.68 mg/dL (ref 0.44–1.00)
GFR, Estimated: >60 mL/min (ref >60)
Glucose, Bld: 129 mg/dL — ABNORMAL HIGH (ref 70–99)
Potassium: 3.4 mmol/L — ABNORMAL LOW (ref 3.5–5.1)
Sodium: 132 mmol/L — ABNORMAL LOW (ref 135–145)

## 2022-07-15 LAB — GLUCOSE, CAPILLARY
Glucose-Capillary: 118 mg/dL — ABNORMAL HIGH (ref 70–99)
Glucose-Capillary: 120 mg/dL — ABNORMAL HIGH (ref 70–99)
Glucose-Capillary: 103 mg/dL — ABNORMAL HIGH (ref 70–99)
Glucose-Capillary: 93 mg/dL (ref 70–99)
Glucose-Capillary: 115 mg/dL — ABNORMAL HIGH (ref 70–99)

## 2022-07-15 LAB — COOXEMETRY PANEL
Carboxyhemoglobin: 1.7 % — ABNORMAL HIGH (ref 0.5–1.5)
Methemoglobin: <0.7 % (ref 0.0–1.5)
O2 Saturation: 84.7 %
Total hemoglobin: 11.5 g/dL — ABNORMAL LOW (ref 12.0–16.0)

## 2022-07-15 LAB — MAGNESIUM
Magnesium: 2 mg/dL (ref 1.7–2.4)
Magnesium: 2 mg/dL (ref 1.7–2.4)

## 2022-07-15 LAB — PHOSPHORUS
Phosphorus: 2.6 mg/dL (ref 2.5–4.6)
Phosphorus: 2.5 mg/dL (ref 2.5–4.6)

## 2022-07-15 MED ORDER — POTASSIUM CHLORIDE 20 MEQ PO PACK
40.0000 meq | PACK | ORAL | Status: AC
  Administered 2022-07-15 (×2): 40 meq
  Filled 2022-07-15 (×2): qty 2

## 2022-07-15 MED ORDER — SODIUM CHLORIDE 0.9 % IV SOLN
2.0000 g | Freq: Three times a day (TID) | INTRAVENOUS | Status: DC
  Administered 2022-07-15 – 2022-07-18 (×9): 2 g via INTRAVENOUS
  Filled 2022-07-15 (×9): qty 12.5

## 2022-07-15 MED ORDER — POTASSIUM CHLORIDE 20 MEQ PO PACK
40.0000 meq | PACK | Freq: Once | ORAL | Status: AC
  Administered 2022-07-15: 40 meq
  Filled 2022-07-15: qty 2

## 2022-07-15 NOTE — Progress Notes (Signed)
NAME:  Carmen Green, MRN:  [id number], DOB:  [birth year], LOS: 3 ADMISSION DATE:  07/12/2022, CONSULTATION DATE: 1/22 REFERRING MD: Dr. Darl Householder EDP, CHIEF COMPLAINT: Cardiac arrest  History of Present Illness:  72 year old female with past medical history as below, which is significant for complete heart block status post pacemaker, mitral valve prolapse, GERD, and esophagitis.  She is also recently been undergoing additional cardiac workup after presenting to the cardiology office on 1/8 with new complaints of shortness of breath and fatigue.  Echocardiogram done 1/16 demonstrated newly reduced LVEF to 25% with severe mitral regurgitation.  There were plans for her to undergo TEE and left/right heart cath, however, she unfortunately presented to [facility] emergency department on 1/22 after suffering cardiac arrest at home at approximately 1830.  Bystanders report she fell to the ground.  Sounds like she did receive bystander CPR and first responders placed AED which did not advise shock.  After EMS arrived and placed cardiac monitor she was in PEA.  Rhythm did eventually changed to V-fib at which point she was defibrillated and ROSC was achieved.  King airway was exchanged for endotracheal tube in the emergency department.  Patient was hypotensive requiring norepinephrine infusion.  EKG with nonspecific possibly ischemic changes.  Cardiology was consulted by the EDP.  PCCM has been asked to admit.  Pertinent  Medical History   has a past medical history of Allergy, Anemia, Anxiety, Arthritis, Cataract, Colon polyp, Complete heart block ([place]), Contusion of chest wall (10/10/2007), Depression, Diverticulitis, Esophagitis, ESOPHAGITIS (06/25/2008), GERD (gastroesophageal reflux disease), H/O seasonal allergies, Heart murmur, Hiatal hernia, Hyperlipidemia, Hypertension, Mitral valve prolapse, and Pacemaker.   Significant Hospital Events: Including procedures, antibiotic start and stop dates in addition to  other pertinent events   1/23 Admission following cardiac arrest; EP consulted for runs of ventricular ectopy and turned pacer up to 90 with significant improvement; Echo LVEF <20% w/ severe MR 1/24 off levo; WBC trending up and tmax 99.3, cooling per TTM protocol; cultures sent and started on broad spectrum abx  Interim History / Subjective:   CVP 6-8 Coox 84 Water temps down on cooling, suggesting pt still trying to spike fever Off propofol since 0825 with no significant improvement in mental status Serratia in trach cx  Objective   Blood pressure (!) 132/50, pulse 91, temperature 98.1 F (36.7 C), resp. rate 12, height '5\' 1"'$  (1.549 m), weight 69.1 kg, SpO2 100 %. CVP:  [5 mmHg-8 mmHg] 5 mmHg  Vent Mode: PSV;CPAP FiO2 (%):  [40 %] 40 % Set Rate:  [14 bmp-22 bmp] 14 bmp Vt Set:  [380 mL] 380 mL PEEP:  [5 cmH20] 5 cmH20 Pressure Support:  [8 cmH20] 8 cmH20 Plateau Pressure:  [13 cmH20] 13 cmH20   Intake/Output Summary (Last 24 hours) at 07/15/2022 1005 Last data filed at 07/15/2022 1000 Gross per 24 hour  Intake 1859.33 ml  Output 710 ml  Net 1149.33 ml   Filed Weights   07/12/22 2310 07/14/22 0500 07/15/22 0449  Weight: 62.1 kg 68.7 kg 69.1 kg    Examination: Off sedation General:  older female lying in bed in NAD HEENT: MM pink/moist, ETT/ OGT, pupils 3/reactive Neuro:  eyes spont open/blinks, but not to threat, not f/c or move spont, slight flicker of wd to noxious stimuli in LE CV: rr, ST PULM:  non labored, clear, on PSV 8/5, no wheeze, RT reports some small thick tan secretions GI: cooling pads in place, no obvious distention, foley Extremities: warm/dry, no LE  edema  Skin: no rashes   1/24 Trach cx > serratia>  1/24 BC> ngtd 1/24 UC>   Labs> K 3.4, sCr stable, improving LFTs, WBC 16> 11, plts 165> 109, H/H 13.1/ 36.8> 11.4/ 33  Tmax 99 UOP 865 ml/ 24hrs Net +953m Net +2.1  Resolved Hospital Problem list     Assessment & Plan:   OOH arrest:  Initially PEA then vfib which was shocked. ROSC unclear, possibly around 347ms.  Suspect in setting that was cardiac in nature given recent change in cardiac history and sudden collapse.  EKG in the ED with potential signs of ischemia, however, in the immediate postarrest setting the significance is uncertain. Plan: -Cards and EP following; appreciate recs - PPM adjusted 60> 90 with improvement in PVCs - cont telemetry monitoring - TTM; continue cooling for normothermia -continue heparin for ischemia and a flutter - amio per cards - cath when stable and pending neuro prognostication.  Plans for possible L/RHC today if neuro improved off sedation, however remains significantly unchanged  Acute hypoxemic respiratory failure Pulmonary edema Serratia PNA Plan: - continues to wean well but mental status remains barrier to extubation - cont to minimize sedation, needs more time for neuro prognostication - full LTVV support/ daily SBT - VAP/ PPI  - PAD> hold for now, prn fentanyl/ propofol as needed.  - stop vanc/ zosyn, MRSA neg> stop vanc, serratia noted on trach asp> switch to cefepime - intermittent CXR and ABG PRN (1/24 ABG reassuring)  Acute encephalopathy: concern for anoxic encephalopathy post arrest -CT head no acute abnormality; EEG no seizures  Plan: - unable to perform MRI due to PPM - likely just needs more time and cont to assess for neuro prognostication.  Remains guarded - minimize sedating meds - sedation RASS 0 to -1 - ammonia 26 - infectious treatment as above   Shock: cardiogenic with possible sepsis, resolved  Acute on chronic HFrEF: echo lvef <20% w/ MR Severe MR Atrial flutter Frequent PVCs CHB w/ [employer] pacemaker Plan: - Cards/EP following appreciate recs - remains off pressors; map goal >65 - coox remains reassuring, trend CVP - sepsis workup as below - cont heparin and amio gtt - hold GDMT for now - TEE when stable - monitor volume status  closely - ischemic workup pending neuro prognostication  Sepsis secondary to serratia PNA - remains off pressors - abx as above - follow remaining cultures - temp management as above   Prolonged QTC Plan: - tele - maximize electrolytes, k goal >4; mag >2 - avoid qtc prolonging agents  R rib fx: s/p CPR Plan: - supportive care  AKI Hypokalemia Plan: -Trend BMP / urinary output -Replace electrolytes as indicated -Avoid nephrotoxic agents, ensure adequate renal perfusion  Elevated LFTs: trending down Plan: - trend LFTs, improving - cont to hold home statin  HTN HLD Plan: - hold statin while LFTs elevated - hold anti-hypertensives, BP's have been soft better after small fluid boluses> normotensive today.  Monitor volume status closely, prn lasix  Anxiety Depression Plan: - hold home meds  Best Practice (right click and "Reselect all SmartList Selections" daily)   Diet/type: NPO; TF  DVT prophylaxis: systemic heparin GI prophylaxis: H2B Lines: Central line Foley:  Yes, and it is still needed Code Status:  DNR Last date of multidisciplinary goals of care discussion [1/24 updated husband at bedside]  Husband updated 1/25 at bedside.   Labs   CBC: Recent Labs  Lab 07/12/22 0057 07/12/22 1945 07/12/22 1951 07/13/22 [id number]  07/13/22 1715 07/14/22 0354 07/14/22 2044 07/15/22 0432  WBC 7.9 12.3*  --  12.9* 14.8* 16.3*  --  11.2*  NEUTROABS 6.4 8.5*  --   --   --   --   --   --   HGB 12.9 13.5   < > 13.1 13.6 13.1 11.2* 11.4*  HCT 36.9 40.4   < > 38.2 39.2 36.8 33.0* 33.0*  MCV 94.6 99.3  --  96.2 94.9 93.4  --  95.7  PLT 201 177  --  168 155 165  --  109*   < > = values in this interval not displayed.    Basic Metabolic Panel: Recent Labs  Lab 07/12/22 1945 07/12/22 1951 07/12/22 1959 07/12/22 2116 07/13/22 0915 07/13/22 1715 07/14/22 0354 07/14/22 2044 07/15/22 0432  NA 130*   < > 135   < > 133* 131* 130* 132* 132*  K 3.2*   < > 3.2*   < > 4.6  3.3* 4.9 4.1 3.4*  CL 100   < > 100  --  100 100 101  --  98  CO2 17*  --   --   --  19* 24 23  --  23  GLUCOSE 198*   < > 196*  --  109* 150* 130*  --  129*  BUN 16   < > 18  --  '22 18 18  '$ --  22  CREATININE 1.44*   < > 1.30*  --  1.32* 0.99 1.12*  --  0.68  CALCIUM 8.0*  --   --   --  8.6* 8.2* 8.2*  --  8.5*  MG  --   --   --   --  1.9 1.8 2.7*  --  2.0  PHOS  --   --   --   --  4.3  --  2.5  --  2.6   < > = values in this interval not displayed.   GFR: Estimated Creatinine Clearance: 55.7 mL/min (by C-G formula based on SCr of 0.68 mg/dL). Recent Labs  Lab 07/13/22 0610 07/13/22 0915 07/13/22 1715 07/14/22 0354 07/14/22 1330 07/14/22 2051 07/15/22 0432  WBC 12.9*  --  14.8* 16.3*  --   --  11.2*  LATICACIDVEN 2.6* 4.3*  --   --  2.3* 1.8  --     Liver Function Tests: Recent Labs  Lab 07/12/22 0057 07/12/22 1945 07/13/22 0915 07/14/22 0354 07/15/22 0432  AST 26 717* 620* 251* 146*  ALT 16 627* 609* 369* 234*  ALKPHOS 53 77 77 61 53  BILITOT 0.6 0.9 0.7 0.7 1.1  PROT 6.7 6.4* 7.1 6.2* 5.8*  ALBUMIN 3.9 3.7 4.3 3.5 3.1*   No results for input(s): "LIPASE", "AMYLASE" in the last 168 hours. Recent Labs  Lab 07/14/22 1033  AMMONIA 26    ABG    Component Value Date/Time   PHART 7.428 07/14/2022 2044   PCO2ART 34.7 07/14/2022 2044   PO2ART 119 (H) 07/14/2022 2044   HCO3 22.9 07/14/2022 2044   TCO2 24 07/14/2022 2044   ACIDBASEDEF 1.0 07/14/2022 2044   O2SAT 84.7 07/15/2022 0432     Coagulation Profile: No results for input(s): "INR", "PROTIME" in the last 168 hours.  Cardiac Enzymes: No results for input(s): "CKTOTAL", "CKMB", "CKMBINDEX", "TROPONINI" in the last 168 hours.  HbA1C: No results found for: "HGBA1C"  CBG: Recent Labs  Lab 07/14/22 1538 07/14/22 1921 07/14/22 2328 07/15/22 0358 07/15/22 0730  GLUCAP 101* 93 119*  118* 120*     Critical care time: 38 minutes     Kennieth Rad, [place] [facility] 07/15/2022, 10:05 AM  See Amion for pager If no response to pager, please call PCCM consult pager After 7:00 pm call Elink

## 2022-07-15 NOTE — Progress Notes (Signed)
Pharmacy Antibiotic Note  Carmen Green is a 72 y.o. female admitted on 07/12/2022 with sepsis.  Pharmacy initially consulted for vancomycin and zosyn dosing.  S/p cardiac arrest. WBC increasing to 16.3, LA 4.3 on last check. Had fever overnight, now on cooling blanket.   Trach aspirate now growing serratia, pharmacy asked to narrow antibiotics to cefepime monotherapy.  Plan: Cefepime 2g IV q 8 hrs. Monitor cx results, clinical pic, and vanc levels as needed  Height: '5\' 1"'$  (154.9 cm) Weight: 69.1 kg (152 lb 5.4 oz) IBW/kg (Calculated) : 47.8  Temp (24hrs), Avg:98.4 F (36.9 C), Min:97.7 F (36.5 C), Max:99 F (37.2 C)  Recent Labs  Lab 07/12/22 1945 07/12/22 1951 07/12/22 1959 07/13/22 0610 07/13/22 0915 07/13/22 1715 07/14/22 0354 07/14/22 1330 07/14/22 2051 07/15/22 0432  WBC 12.3*  --   --  12.9*  --  14.8* 16.3*  --   --  11.2*  CREATININE 1.44*   < > 1.30*  --  1.32* 0.99 1.12*  --   --  0.68  LATICACIDVEN  --   --   --  2.6* 4.3*  --   --  2.3* 1.8  --    < > = values in this interval not displayed.     Estimated Creatinine Clearance: 55.7 mL/min (by C-G formula based on SCr of 0.68 mg/dL).    Allergies  Allergen Reactions   Zoloft [Sertraline Hcl] Nausea Only        Amoxapine And Related Rash   Cephalexin Rash   Oxycodone     "Loopy"   Penicillins Rash    Antimicrobials this admission: Vancomycin 1/24 >> 1/25 Zosyn 1/24 >> 1/25 Cefepime 1/25 >   Dose adjustments this admission: N/A  Microbiology results: MRSA PCR 1/23: neg  Bcx 1/24: ordered Ucx 1/24: ordered Trach aspirate 1/24: few serratia COVID/Flu PCR 1/24: neg  Thank you for allowing pharmacy to be a part of this patient's care.  [place] Crane, Roylene Reason, BCCP Clinical Pharmacist  07/15/2022 1:34 PM   [facility] pharmacy phone numbers are listed on [email or web address]

## 2022-07-15 NOTE — Progress Notes (Signed)
Advanced Heart Failure Rounding Note  PCP-Cardiologist: None   Subjective:   - Intubated/sedated; no issues overnight.  - CVP 8-10  - Withdrawing to pain  - +2L q24h  Objective:   Weight Range: 69.1 kg Body mass index is 28.78 kg/m.   Vital Signs:   Temp:  [97.7 F (36.5 C)-99 F (37.2 C)] 98.1 F (36.7 C) (01/25 0600) Pulse Rate:  [76-100] 96 (01/25 0600) Resp:  [11-25] 17 (01/25 0600) BP: (95-149)/(48-109) 97/87 (01/25 0600) SpO2:  [96 %-100 %] 98 % (01/25 0600) FiO2 (%):  [40 %] 40 % (01/25 0400) Weight:  [69.1 kg] 69.1 kg (01/25 0449) Last BM Date :  (pta)  Weight change: Filed Weights   07/12/22 2310 07/14/22 0500 07/15/22 0449  Weight: 62.1 kg 68.7 kg 69.1 kg    Intake/Output:   Intake/Output Summary (Last 24 hours) at 07/15/2022 0086 Last data filed at 07/15/2022 0600 Gross per 24 hour  Intake 1756.13 ml  Output 865 ml  Net 891.13 ml       Physical Exam   CVP 8-10 General:  Intubated/sedated  HEENT: ETT Neck: supple. no JVD. Carotids 2+ bilat; no bruits. No lymphadenopathy or thryomegaly appreciated. Cor: PMI nondisplaced. Regular rate & rhythm. No rubs, gallops or murmurs. L upper chest PPM R upper chest PPM abandoned Lungs: clear Abdomen: soft, nontender, nondistended. No hepatosplenomegaly. No bruits or masses. Good bowel sounds. Extremities: no cyanosis, clubbing, rash, edema Neuro:Intubated/sedated. Withdrawing to pain.   Telemetry   Paced 90s  EKG    N/A  Labs    CBC Recent Labs    07/12/22 1945 07/12/22 1951 07/14/22 0354 07/14/22 2044 07/15/22 0432  WBC 12.3*   < > 16.3*  --  11.2*  NEUTROABS 8.5*  --   --   --   --   HGB 13.5   < > 13.1 11.2* 11.4*  HCT 40.4   < > 36.8 33.0* 33.0*  MCV 99.3   < > 93.4  --  95.7  PLT 177   < > 165  --  109*   < > = values in this interval not displayed.    Basic Metabolic Panel Recent Labs    07/14/22 0354 07/14/22 2044 07/15/22 0432  NA 130* 132* 132*  K 4.9 4.1 3.4*  CL  101  --  98  CO2 23  --  23  GLUCOSE 130*  --  129*  BUN 18  --  22  CREATININE 1.12*  --  0.68  CALCIUM 8.2*  --  8.5*  MG 2.7*  --  2.0  PHOS 2.5  --  2.6    Liver Function Tests Recent Labs    07/14/22 0354 07/15/22 0432  AST 251* 146*  ALT 369* 234*  ALKPHOS 61 53  BILITOT 0.7 1.1  PROT 6.2* 5.8*  ALBUMIN 3.5 3.1*    No results for input(s): "LIPASE", "AMYLASE" in the last 72 hours. Cardiac Enzymes No results for input(s): "CKTOTAL", "CKMB", "CKMBINDEX", "TROPONINI" in the last 72 hours.  BNP: BNP (last 3 results) Recent Labs    07/12/22 0057  BNP 547.6*     ProBNP (last 3 results) Recent Labs    06/28/22 1040  PROBNP 3,230*      D-Dimer No results for input(s): "DDIMER" in the last 72 hours. Hemoglobin A1C No results for input(s): "HGBA1C" in the last 72 hours. Fasting Lipid Panel Recent Labs    07/13/22 0915  TRIG 78    Thyroid  Function Tests No results for input(s): "TSH", "T4TOTAL", "T3FREE", "THYROIDAB" in the last 72 hours.  Invalid input(s): "FREET3"  Other results:   Imaging    No results found.   Medications:     Scheduled Medications:  aspirin  81 mg Per Tube Daily   Chlorhexidine Gluconate Cloth  6 each Topical Daily   docusate  100 mg Per Tube BID   famotidine  20 mg Per Tube BID   feeding supplement (PROSource TF20)  60 mL Per Tube Daily   mouth rinse  15 mL Mouth Rinse Q2H   polyethylene glycol  17 g Per Tube Daily    Infusions:  sodium chloride Stopped (07/13/22 1748)   sodium chloride     amiodarone 30 mg/hr (07/15/22 0600)   feeding supplement (VITAL 1.5 CAL) 30 mL/hr at 07/15/22 0600   heparin 1,050 Units/hr (07/15/22 0600)   piperacillin-tazobactam (ZOSYN)  IV 3.375 g (07/15/22 0604)   propofol (DIPRIVAN) infusion 30 mcg/kg/min (07/15/22 0600)   vancomycin      PRN Medications: sodium chloride, acetaminophen **OR** acetaminophen (TYLENOL) oral liquid 160 mg/5 mL **OR** acetaminophen, fentaNYL  (SUBLIMAZE) injection, fentaNYL (SUBLIMAZE) injection, ondansetron (ZOFRAN) IV, mouth rinse    Patient Profile     72 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  Assessment/Plan   1. PEA Arrest--> V Fib--> shock  - Witnessed arrest. CPR/EPI/Shock x2 with ROSC Unclear etiology/ Suspect ischemic event.  TSH ok. HIV NR  Echo 06/1622 recently noted to be  25-30% with WMA + mildly reduced RV, and dilated LA.  - Lactic Acid 2.6>4.3 ->1.8 - HS Trop 463-->753 .Will need cath once stabilized  - LFTs trending down.  elevated suspect in the setting of shock. .   - CT head - negative  -EEG-diffuse slowing. No seizures.  - Off Norepi. Maps stable. CO-OX 84% - Vanc/zosyn day 2 - Positive 2L since admit; maintain even I/Os; may require small doses of IV lasix moving forward.  - Discussed sedation wean with nurses; if she appears to be neurologically intact plan for LHC/RHC today.  .  2. Acute Hypoxic Respiratory Failure Intubated per CCM    3. Acute/Chronic HFrEF Etiology unclear. Echo as noted about EF 25-30% with WMA.  - Will need eventual cath. Severe MR on ECHO possible valvular disease. -Also significant RV pacing.  - POCUS- IVC not dilated.  - No room for GDMT . Hold BB.  - Avoid SGLT2i with cystitis 2 occasions last year.    4.  A flutter  - Continue amio drip.  - Continue heparin drip.  - Pacing at 90 BPM   5 . AKI -Resolved.    6.  PVCs -Frequent PVCs -Continue amio drip.    7.  MR, severe on Echo  Once stabilized will need TEE   8.  H/O CHB--> St Jude PPM  Gen changed 2004, 2012.  Abandoned R side device. L sided device implanted 2021.  EP appreciated. Pacer rate increased to 90.    9.ID:  WBC ct improving after addition of vanc/zosyn. Will continue for now.   10.  DNR    Length of Stay: [place], DO  07/15/2022, 6:32 AM  Advanced Heart Failure  Team Pager [phone] (M-F; 7a - 5p)  Please contact [facility] for night-coverage after hours (5p -7a ) and weekends on [email or web address] \

## 2022-07-15 NOTE — Progress Notes (Signed)
Rounding Note    Patient Name: Carmen Green Date of Encounter: 07/15/2022  [place] Cardiologist: previously Dr. Rayann Heman >>> will transition to Dr. Quentin Ore  Subjective   Remains intubated  Inpatient Medications    Scheduled Meds:  aspirin  81 mg Per Tube Daily   Chlorhexidine Gluconate Cloth  6 each Topical Daily   docusate  100 mg Per Tube BID   famotidine  20 mg Per Tube BID   feeding supplement (PROSource TF20)  60 mL Per Tube Daily   mouth rinse  15 mL Mouth Rinse Q2H   polyethylene glycol  17 g Per Tube Daily   Continuous Infusions:  sodium chloride Stopped (07/13/22 1748)   sodium chloride     amiodarone 30 mg/hr (07/15/22 0700)   feeding supplement (VITAL 1.5 CAL) 30 mL/hr at 07/15/22 0700   heparin 1,150 Units/hr (07/15/22 0738)   piperacillin-tazobactam (ZOSYN)  IV 12.5 mL/hr at 07/15/22 0700   propofol (DIPRIVAN) infusion 10 mcg/kg/min (07/15/22 0700)   vancomycin     PRN Meds: sodium chloride, acetaminophen **OR** acetaminophen (TYLENOL) oral liquid 160 mg/5 mL **OR** acetaminophen, fentaNYL (SUBLIMAZE) injection, fentaNYL (SUBLIMAZE) injection, ondansetron (ZOFRAN) IV, mouth rinse   Vital Signs    Vitals:   07/15/22 0530 07/15/22 0600 07/15/22 0700 07/15/22 0805  BP: 128/70 97/87 (!) 111/46   Pulse:  96 91 91  Resp: '13 17 11 12  '$ Temp:  98.1 F (36.7 C)    TempSrc:      SpO2:  98% 98% 100%  Weight:      Height:        Intake/Output Summary (Last 24 hours) at 07/15/2022 0815 Last data filed at 07/15/2022 0700 Gross per 24 hour  Intake 1830.62 ml  Output 865 ml  Net 965.62 ml      07/15/2022    4:49 AM 07/14/2022    5:00 AM 07/12/2022   11:10 PM  Last 3 Weights  Weight (lbs) 152 lb 5.4 oz 151 lb 7.3 oz 136 lb 14.5 oz  Weight (kg) 69.1 kg 68.7 kg 62.1 kg      Telemetry    AV pacing - Personally Reviewed  ECG    No new EKGs - Personally Reviewed  Physical Exam   GEN: intubated, eyes open, gaze to left   Neck: No  JVD Cardiac: RRR, 2/6 SM, no rubs, or gallops.  Respiratory: CTA b/l (anterior/lateral auscultation only). GI: Soft, non-distended  MS: No edema; No deformity, thin body habitus Neuro: unable to assess, in d/w RN, with reduction in sedation, no purposful movements and some posturing, no blink to threat, some reaction to pain Psych: unable to assess  Labs    High Sensitivity Troponin:   Recent Labs  Lab 07/12/22 0057 07/12/22 0248 07/12/22 1945 07/12/22 2223 07/13/22 0610  TROPONINIHS 19* 23* 463* 753* 830*     Chemistry Recent Labs  Lab 07/13/22 0915 07/13/22 1715 07/14/22 0354 07/14/22 2044 07/15/22 0432  NA 133* 131* 130* 132* 132*  K 4.6 3.3* 4.9 4.1 3.4*  CL 100 100 101  --  98  CO2 19* 24 23  --  23  GLUCOSE 109* 150* 130*  --  129*  BUN '22 18 18  '$ --  22  CREATININE 1.32* 0.99 1.12*  --  0.68  CALCIUM 8.6* 8.2* 8.2*  --  8.5*  MG 1.9 1.8 2.7*  --  2.0  PROT 7.1  --  6.2*  --  5.8*  ALBUMIN 4.3  --  3.5  --  3.1*  AST 620*  --  251*  --  146*  ALT 609*  --  369*  --  234*  ALKPHOS 77  --  61  --  53  BILITOT 0.7  --  0.7  --  1.1  GFRNONAA 43* >60 52*  --  >60  ANIONGAP '14 7 6  '$ --  11    Lipids  Recent Labs  Lab 07/13/22 0915  TRIG 78    Hematology Recent Labs  Lab 07/13/22 1715 07/14/22 0354 07/14/22 2044 07/15/22 0432  WBC 14.8* 16.3*  --  11.2*  RBC 4.13 3.94  --  3.45*  HGB 13.6 13.1 11.2* 11.4*  HCT 39.2 36.8 33.0* 33.0*  MCV 94.9 93.4  --  95.7  MCH 32.9 33.2  --  33.0  MCHC 34.7 35.6  --  34.5  RDW 11.9 11.9  --  12.1  PLT 155 165  --  109*   Thyroid No results for input(s): "TSH", "FREET4" in the last 168 hours.  BNP Recent Labs  Lab 07/12/22 0057  BNP 547.6*    DDimer No results for input(s): "DDIMER" in the last 168 hours.   Radiology      Cardiac Studies   07/13/22: TTE  1. Compared to echo from 07/06/22, LVEF is slightly lower and MR does not  appear as severe.   2. Frequent PVCs, couplets, triplets noted during  study.. Left  ventricular ejection fraction, by estimation, is <20%. The left ventricle  has severely decreased function. The left ventricle demonstrates global  hypokinesis.   3. Right ventricular systolic function is low normal. The right  ventricular size is normal. There is normal pulmonary artery systolic  pressure.   4. Left atrial size was severely dilated.   5. Pacer wire present.   6. Mitral regurgitation is eccentric, directed more posterior Ther is an  echo bright density along subvalvular surface May represtne calcified  chordae (image 68 ). Moderate mitral valve regurgitation.   7. The aortic valve is tricuspid. Aortic valve regurgitation is not  visualized.   8. The inferior vena cava is normal in size with greater than 50%  respiratory variability, suggesting right atrial pressure of 3 mmHg.      07/06/22: TTE 1. Left ventricular ejection fraction, by estimation, is 25 to 30%. The  left ventricle has severely decreased function. The left ventricle  demonstrates global hypokinesis. The left ventricular internal cavity size  was mildly dilated. There is mild  asymmetric left ventricular hypertrophy of the basal-septal segment. Left  ventricular diastolic parameters are consistent with Grade II diastolic  dysfunction (pseudonormalization).   2. Right ventricular systolic function is mildly reduced. The right  ventricular size is normal. There is moderately elevated pulmonary artery  systolic pressure. The estimated right ventricular systolic pressure is  23.5 mmHg.   3. Left atrial size was severely dilated.   4. The mitral valve is abnormal, the valve is calcified and posterior  leaflet appears restricted. Severe mitral valve regurgitation. No evidence  of mitral stenosis.   5. Tricuspid valve regurgitation is mild to moderate.   6. The aortic valve is tricuspid. Aortic valve regurgitation is not  visualized. No aortic stenosis is present.   7. The inferior vena cava  is normal in size with greater than 50%  respiratory variability, suggesting right atrial pressure of 3 mmHg.   8. Function appears significant worse than prior echo, consider CHF  clinic referral.  08/17/19: TTE  1. Left ventricular ejection fraction, by estimation, is 50 to 55%. The  left ventricle has low normal function. The left ventricle has no regional  wall motion abnormalities. The left ventricular internal cavity size was  mildly dilated. There is mild  asymmetric left ventricular hypertrophy of the basal-septal segment. Left  ventricular diastolic parameters are indeterminate.   2. Right ventricular systolic function is normal. The right ventricular  size is normal. There is mildly elevated pulmonary artery systolic  pressure. The estimated right ventricular systolic pressure is 99.3 mmHg.   3. Mild mitral valve prolapse, anterior leaflet > posterior leaflet with  anterior leaflet override and mild-moderate, posteriorly directed mitral  regurgitation. The mitral valve is abnormal. Mild to moderate mitral valve  regurgitation. No evidence of  mitral stenosis.   4. Tricuspid valve regurgitation is mild to moderate.   5. The aortic valve is abnormal. Aortic valve regurgitation is not  visualized. No aortic stenosis is present.   6. The inferior vena cava is normal in size with greater than 50%  respiratory variability, suggesting right atrial pressure of 3 mmHg.   Comparison(s): A prior study was performed on 08/11/2017. Prior images  reviewed side by side. No significant change in LV function. Mitral  regurgitation has increased.     Patient Profile     72 y.o. female  with a hx of CHB w/PPM, PVCs, HTN, new CM and MR admitted with cardiac arrest  Assessment & Plan    Cardiac arrest New CM New MR  Down-time is unclear prior to BLS arrival EMS report: BLS arrival w/CPR 18:19 > 1st BP recorded 18:48  cooling blanket for this to normothermia  Levophed is  off Amiodarone at 30 Heparin gtt  Pacing at 90 with marked reduction in V ectopy, no VT  Pending clinical course/neuro recovery, eventual cath This AM: Withdraws to painful stimuli RUE No blink to threat Eyes open fixed gaze left Guarded prognosis   For questions or updates, please contact [place] Please consult [email or web address] for contact info under        Signed, Baldwin [place], PA-C  07/15/2022, 8:15 AM

## 2022-07-15 NOTE — Plan of Care (Signed)
Problem: Education: Goal: Understanding of CV disease, CV risk reduction, and recovery process will improve 07/15/2022 2301 by Driscilla Grammes, Ardeen Fillers, RN Outcome: Progressing 07/15/2022 2301 by Driscilla Grammes, Ardeen Fillers, RN Outcome: Progressing Goal: Individualized Educational Video(s) 07/15/2022 2301 by Driscilla Grammes, Ardeen Fillers, RN Outcome: Progressing 07/15/2022 2301 by Driscilla Grammes, Ardeen Fillers, RN Outcome: Progressing   Problem: Activity: Goal: Ability to return to baseline activity level will improve 07/15/2022 2301 by Driscilla Grammes, Ardeen Fillers, RN Outcome: Progressing 07/15/2022 2301 by Driscilla Grammes, Ardeen Fillers, RN Outcome: Progressing   Problem: Cardiovascular: Goal: Ability to achieve and maintain adequate cardiovascular perfusion will improve 07/15/2022 2301 by Driscilla Grammes, Ardeen Fillers, RN Outcome: Progressing 07/15/2022 2301 by Driscilla Grammes, Ardeen Fillers, RN Outcome: Progressing Goal: Vascular access site(s) Level 0-1 will be maintained 07/15/2022 2301 by Tillie Fantasia, RN Outcome: Progressing 07/15/2022 2301 by Driscilla Grammes, Ardeen Fillers, RN Outcome: Progressing   Problem: Health Behavior/Discharge Planning: Goal: Ability to safely manage health-related needs after discharge will improve 07/15/2022 2301 by Tillie Fantasia, RN Outcome: Progressing 07/15/2022 2301 by Driscilla Grammes, Ardeen Fillers, RN Outcome: Progressing   Problem: Education: Goal: Ability to manage disease process will improve 07/15/2022 2301 by Tillie Fantasia, RN Outcome: Progressing 07/15/2022 2301 by Driscilla Grammes, Ardeen Fillers, RN Outcome: Progressing   Problem: Cardiac: Goal: Ability to achieve and maintain adequate cardiopulmonary perfusion will improve 07/15/2022 2301 by Tillie Fantasia, RN Outcome: Progressing 07/15/2022 2301 by Driscilla Grammes, Ardeen Fillers, RN Outcome: Progressing   Problem: Neurologic: Goal: Promote progressive neurologic  recovery 07/15/2022 2301 by Tillie Fantasia, RN Outcome: Progressing 07/15/2022 2301 by Driscilla Grammes, Ardeen Fillers, RN Outcome: Progressing   Problem: Skin Integrity: Goal: Risk for impaired skin integrity will be minimized. 07/15/2022 2301 by Driscilla Grammes, Ardeen Fillers, RN Outcome: Progressing 07/15/2022 2301 by Driscilla Grammes, Ardeen Fillers, RN Outcome: Progressing   Problem: Education: Goal: Knowledge of General Education information will improve Description: Including pain rating scale, medication(s)/side effects and non-pharmacologic comfort measures 07/15/2022 2301 by Driscilla Grammes, Ardeen Fillers, RN Outcome: Progressing 07/15/2022 2301 by Driscilla Grammes, Ardeen Fillers, RN Outcome: Progressing   Problem: Health Behavior/Discharge Planning: Goal: Ability to manage health-related needs will improve 07/15/2022 2301 by Tillie Fantasia, RN Outcome: Progressing 07/15/2022 2301 by Driscilla Grammes, Ardeen Fillers, RN Outcome: Progressing   Problem: Clinical Measurements: Goal: Ability to maintain clinical measurements within normal limits will improve 07/15/2022 2301 by Tillie Fantasia, RN Outcome: Progressing 07/15/2022 2301 by Driscilla Grammes, Ardeen Fillers, RN Outcome: Progressing Goal: Will remain free from infection 07/15/2022 2301 by Tillie Fantasia, RN Outcome: Progressing 07/15/2022 2301 by Driscilla Grammes, Ardeen Fillers, RN Outcome: Progressing Goal: Diagnostic test results will improve 07/15/2022 2301 by Tillie Fantasia, RN Outcome: Progressing 07/15/2022 2301 by Driscilla Grammes, Ardeen Fillers, RN Outcome: Progressing Goal: Respiratory complications will improve 07/15/2022 2301 by Tillie Fantasia, RN Outcome: Progressing 07/15/2022 2301 by Driscilla Grammes, Ardeen Fillers, RN Outcome: Progressing Goal: Cardiovascular complication will be avoided 07/15/2022 2301 by Tillie Fantasia, RN Outcome: Progressing 07/15/2022 2301 by  Driscilla Grammes, Ardeen Fillers, RN Outcome: Progressing   Problem: Activity: Goal: Risk for activity intolerance will decrease 07/15/2022 2301 by Driscilla Grammes, Ardeen Fillers, RN Outcome: Progressing 07/15/2022 2301 by Driscilla Grammes, Ardeen Fillers, RN Outcome: Progressing   Problem: Nutrition: Goal: Adequate nutrition will be maintained 07/15/2022 2301 by Tillie Fantasia, RN Outcome: Progressing 07/15/2022 2301 by Driscilla Grammes, Ardeen Fillers,  RN Outcome: Progressing   Problem: Coping: Goal: Level of anxiety will decrease 07/15/2022 2301 by Driscilla Grammes, Ardeen Fillers, RN Outcome: Progressing 07/15/2022 2301 by Driscilla Grammes, Ardeen Fillers, RN Outcome: Progressing   Problem: Elimination: Goal: Will not experience complications related to bowel motility 07/15/2022 2301 by Tillie Fantasia, RN Outcome: Progressing 07/15/2022 2301 by Driscilla Grammes, Ardeen Fillers, RN Outcome: Progressing Goal: Will not experience complications related to urinary retention 07/15/2022 2301 by Tillie Fantasia, RN Outcome: Progressing 07/15/2022 2301 by Driscilla Grammes, Ardeen Fillers, RN Outcome: Progressing   Problem: Pain Managment: Goal: General experience of comfort will improve 07/15/2022 2301 by Tillie Fantasia, RN Outcome: Progressing 07/15/2022 2301 by Driscilla Grammes, Ardeen Fillers, RN Outcome: Progressing   Problem: Safety: Goal: Ability to remain free from injury will improve 07/15/2022 2301 by Tillie Fantasia, RN Outcome: Progressing 07/15/2022 2301 by Driscilla Grammes, Ardeen Fillers, RN Outcome: Progressing   Problem: Skin Integrity: Goal: Risk for impaired skin integrity will decrease 07/15/2022 2301 by Tillie Fantasia, RN Outcome: Progressing 07/15/2022 2301 by Driscilla Grammes, Ardeen Fillers, RN Outcome: Progressing

## 2022-07-15 NOTE — Plan of Care (Signed)
  Problem: Education: Goal: Understanding of CV disease, CV risk reduction, and recovery process will improve Outcome: Progressing Goal: Individualized Educational Video(s) Outcome: Progressing   Problem: Activity: Goal: Ability to return to baseline activity level will improve Outcome: Progressing   Problem: Cardiovascular: Goal: Ability to achieve and maintain adequate cardiovascular perfusion will improve Outcome: Progressing Goal: Vascular access site(s) Level 0-1 will be maintained Outcome: Progressing   Problem: Health Behavior/Discharge Planning: Goal: Ability to safely manage health-related needs after discharge will improve Outcome: Progressing   Problem: Education: Goal: Ability to manage disease process will improve Outcome: Progressing   Problem: Cardiac: Goal: Ability to achieve and maintain adequate cardiopulmonary perfusion will improve Outcome: Progressing   Problem: Neurologic: Goal: Promote progressive neurologic recovery Outcome: Progressing   Problem: Skin Integrity: Goal: Risk for impaired skin integrity will be minimized. Outcome: Progressing   Problem: Education: Goal: Knowledge of General Education information will improve Description: Including pain rating scale, medication(s)/side effects and non-pharmacologic comfort measures Outcome: Progressing   Problem: Health Behavior/Discharge Planning: Goal: Ability to manage health-related needs will improve Outcome: Progressing   Problem: Clinical Measurements: Goal: Ability to maintain clinical measurements within normal limits will improve Outcome: Progressing Goal: Will remain free from infection Outcome: Progressing Goal: Diagnostic test results will improve Outcome: Progressing Goal: Respiratory complications will improve Outcome: Progressing Goal: Cardiovascular complication will be avoided Outcome: Progressing   Problem: Activity: Goal: Risk for activity intolerance will  decrease Outcome: Progressing   Problem: Nutrition: Goal: Adequate nutrition will be maintained Outcome: Progressing   Problem: Coping: Goal: Level of anxiety will decrease Outcome: Progressing   Problem: Elimination: Goal: Will not experience complications related to bowel motility Outcome: Progressing Goal: Will not experience complications related to urinary retention Outcome: Progressing   Problem: Pain Managment: Goal: General experience of comfort will improve Outcome: Progressing   Problem: Safety: Goal: Ability to remain free from injury will improve Outcome: Progressing   Problem: Skin Integrity: Goal: Risk for impaired skin integrity will decrease Outcome: Progressing

## 2022-07-15 NOTE — Progress Notes (Signed)
ANTICOAGULATION CONSULT NOTE  Pharmacy Consult for heparin infusion Indication:  Afib with possible ACS  Allergies  Allergen Reactions   Zoloft [Sertraline Hcl] Nausea Only        Amoxapine And Related Rash   Cephalexin Rash   Oxycodone     "Loopy"   Penicillins Rash    Patient Measurements: Height: '5\' 1"'$  (154.9 cm) Weight: 69.1 kg (152 lb 5.4 oz) IBW/kg (Calculated) : 47.8 Heparin Dosing Weight: 60kg  Vital Signs: Temp: 98.1 F (36.7 C) (01/25 0600) Temp Source: Esophageal (01/25 0400) BP: 111/46 (01/25 0700) Pulse Rate: 91 (01/25 0805)  Labs: Recent Labs    07/12/22 1945 07/12/22 1951 07/12/22 2223 07/12/22 2356 07/13/22 0610 07/13/22 0915 07/13/22 1715 07/13/22 2102 07/14/22 0354 07/14/22 1100 07/14/22 2044 07/15/22 0432  HGB 13.5   < >  --    < > 13.1  --  13.6  --  13.1  --  11.2* 11.4*  HCT 40.4   < >  --    < > 38.2  --  39.2  --  36.8  --  33.0* 33.0*  PLT 177  --   --   --  168  --  155  --  165  --   --  109*  HEPARINUNFRC  --   --   --   --   --    < >  --    < > 0.48 0.34  --  0.15*  CREATININE 1.44*   < >  --   --   --    < > 0.99  --  1.12*  --   --  0.68  TROPONINIHS 463*  --  753*  --  830*  --   --   --   --   --   --   --    < > = values in this interval not displayed.     Estimated Creatinine Clearance: 55.7 mL/min (by C-G formula based on SCr of 0.68 mg/dL).   Assessment: 72yo female had witnessed arrest at home, required four rounds and shock x2 before ROSC, to begin heparin for Afib and possible ACS.  Heparin level subtherapeutic this AM at 0.15.  No overt bleeding or complications noted, CBC slightly downtrending.  No known issues with IV heparin infusion overnight.  Goal of Therapy:  Heparin level 0.3-0.7 units/ml Monitor platelets by anticoagulation protocol: Yes   Plan:  Increase IV heparin to 1150 units/hr. Repeat heparin level in 8 hrs. Daily heparin level and CBC F/u plans for cath eventually pending neuro  status.  [place] Crane, Roylene Reason, BCCP Clinical Pharmacist  07/15/2022 9:30 AM   [facility] pharmacy phone numbers are listed on [email or web address]

## 2022-07-15 NOTE — Progress Notes (Signed)
[facility] ADULT ICU REPLACEMENT PROTOCOL   The patient does apply for the [facility] Adult ICU Electrolyte Replacment Protocol based on the criteria listed below:   1.Exclusion criteria: TCTS, ECMO, Dialysis, and Myasthenia Gravis patients 2. Is GFR >/= 30 ml/min? Yes.    Patient's GFR today is >60 3. Is SCr </= 2? Yes.   Patient's SCr is 0.68 mg/dL 4. Did SCr increase >/= 0.5 in 24 hours? No. 5.Pt's weight >40kg  Yes.   6. Abnormal electrolyte(s):   K 3.4  7. Electrolytes replaced per protocol 8.  Call MD STAT for K+ </= 2.5, Phos </= 1, or Mag </= 1 Physician:  Lowella Bandy R Ferrell Flam 07/15/2022 5:46 AM

## 2022-07-15 NOTE — Progress Notes (Signed)
ANTICOAGULATION CONSULT NOTE  Pharmacy Consult for heparin infusion Indication:  Afib with possible ACS  Allergies  Allergen Reactions   Zoloft [Sertraline Hcl] Nausea Only        Amoxapine And Related Rash   Cephalexin Rash   Oxycodone     "Loopy"   Penicillins Rash    Patient Measurements: Height: '5\' 1"'$  (154.9 cm) Weight: 69.1 kg (152 lb 5.4 oz) IBW/kg (Calculated) : 47.8 Heparin Dosing Weight: 60kg  Vital Signs: Temp: 98.2 F (36.8 C) (01/25 1600) Temp Source: Esophageal (01/25 0800) BP: 125/91 (01/25 1330) Pulse Rate: 103 (01/25 1330)  Labs: Recent Labs    07/12/22 1945 07/12/22 1951 07/12/22 2223 07/12/22 2356 07/13/22 0610 07/13/22 0915 07/13/22 1715 07/13/22 2102 07/14/22 0354 07/14/22 1100 07/14/22 2044 07/15/22 0432 07/15/22 1611  HGB 13.5   < >  --    < > 13.1  --  13.6  --  13.1  --  11.2* 11.4*  --   HCT 40.4   < >  --    < > 38.2  --  39.2  --  36.8  --  33.0* 33.0*  --   PLT 177  --   --   --  168  --  155  --  165  --   --  109*  --   HEPARINUNFRC  --   --   --   --   --    < >  --    < > 0.48 0.34  --  0.15* 0.26*  CREATININE 1.44*   < >  --   --   --    < > 0.99  --  1.12*  --   --  0.68  --   TROPONINIHS 463*  --  753*  --  830*  --   --   --   --   --   --   --   --    < > = values in this interval not displayed.     Estimated Creatinine Clearance: 55.7 mL/min (by C-G formula based on SCr of 0.68 mg/dL).   Assessment: 72yo female had witnessed arrest at home, required four rounds and shock x2 before ROSC, to begin heparin for Afib and possible ACS.  Heparin level came back just below goal so we will increase rate and check another level in 6 hr. No bleeding issue per Rn.  Goal of Therapy:  Heparin level 0.3-0.7 units/ml Monitor platelets by anticoagulation protocol: Yes   Plan:  Increase IV heparin to 1300 units/hr. Repeat heparin level in 6 hrs. Daily heparin level and CBC F/u plans for cath eventually pending neuro  status.  Onnie Boer, PharmD, BCIDP, AAHIVP, CPP Infectious Disease Pharmacist 07/15/2022 5:16 PM

## 2022-07-16 ENCOUNTER — Other Ambulatory Visit (HOSPITAL_COMMUNITY): Payer: Medicare Other

## 2022-07-16 ENCOUNTER — Inpatient Hospital Stay (HOSPITAL_COMMUNITY): Payer: Medicare Other

## 2022-07-16 DIAGNOSIS — I469 Cardiac arrest, cause unspecified: Secondary | ICD-10-CM | POA: Diagnosis not present

## 2022-07-16 LAB — CULTURE, RESPIRATORY W GRAM STAIN: Gram Stain: NONE SEEN

## 2022-07-16 LAB — HEPATIC FUNCTION PANEL
ALT: 174 U/L — ABNORMAL HIGH (ref 0–44)
AST: 98 U/L — ABNORMAL HIGH (ref 15–41)
Albumin: 3.1 g/dL — ABNORMAL LOW (ref 3.5–5.0)
Alkaline Phosphatase: 55 U/L (ref 38–126)
Bilirubin, Direct: 0.3 mg/dL — ABNORMAL HIGH (ref 0.0–0.2)
Indirect Bilirubin: 0.6 mg/dL (ref 0.3–0.9)
Total Bilirubin: 0.9 mg/dL (ref 0.3–1.2)
Total Protein: 6 g/dL — ABNORMAL LOW (ref 6.5–8.1)

## 2022-07-16 LAB — CBC
HCT: 32.7 % — ABNORMAL LOW (ref 36.0–46.0)
Hemoglobin: 11 g/dL — ABNORMAL LOW (ref 12.0–15.0)
MCH: 32.9 pg (ref 26.0–34.0)
MCHC: 33.6 g/dL (ref 30.0–36.0)
MCV: 97.9 fL (ref 80.0–100.0)
Platelets: 122 10*3/uL — ABNORMAL LOW (ref 150–400)
RBC: 3.34 MIL/uL — ABNORMAL LOW (ref 3.87–5.11)
RDW: 12.3 % (ref 11.5–15.5)
WBC: 14.7 10*3/uL — ABNORMAL HIGH (ref 4.0–10.5)
nRBC: 0 % (ref 0.0–0.2)

## 2022-07-16 LAB — BASIC METABOLIC PANEL
Anion gap: 7 (ref 5–15)
BUN: 26 mg/dL — ABNORMAL HIGH (ref 8–23)
CO2: 22 mmol/L (ref 22–32)
Calcium: 8.9 mg/dL (ref 8.9–10.3)
Chloride: 103 mmol/L (ref 98–111)
Creatinine, Ser: 0.68 mg/dL (ref 0.44–1.00)
GFR, Estimated: >60 mL/min (ref >60)
Glucose, Bld: 128 mg/dL — ABNORMAL HIGH (ref 70–99)
Potassium: 5 mmol/L (ref 3.5–5.1)
Sodium: 132 mmol/L — ABNORMAL LOW (ref 135–145)

## 2022-07-16 LAB — GLUCOSE, CAPILLARY
Glucose-Capillary: 118 mg/dL — ABNORMAL HIGH (ref 70–99)
Glucose-Capillary: 132 mg/dL — ABNORMAL HIGH (ref 70–99)
Glucose-Capillary: 137 mg/dL — ABNORMAL HIGH (ref 70–99)
Glucose-Capillary: 138 mg/dL — ABNORMAL HIGH (ref 70–99)
Glucose-Capillary: 170 mg/dL — ABNORMAL HIGH (ref 70–99)
Glucose-Capillary: 189 mg/dL — ABNORMAL HIGH (ref 70–99)

## 2022-07-16 LAB — COOXEMETRY PANEL
Carboxyhemoglobin: 1.6 % — ABNORMAL HIGH (ref 0.5–1.5)
Methemoglobin: <0.7 % (ref 0.0–1.5)
O2 Saturation: 81.1 %
Total hemoglobin: 11.1 g/dL — ABNORMAL LOW (ref 12.0–16.0)

## 2022-07-16 LAB — TRIGLYCERIDES: Triglycerides: 86 mg/dL (ref <150)

## 2022-07-16 LAB — HEPARIN LEVEL (UNFRACTIONATED)
Heparin Unfractionated: 0.32 IU/mL (ref 0.30–0.70)
Heparin Unfractionated: 0.33 IU/mL (ref 0.30–0.70)

## 2022-07-16 LAB — MAGNESIUM: Magnesium: 2 mg/dL (ref 1.7–2.4)

## 2022-07-16 LAB — PHOSPHORUS: Phosphorus: 2.8 mg/dL (ref 2.5–4.6)

## 2022-07-16 MED ORDER — BUSPIRONE HCL 10 MG PO TABS
30.0000 mg | ORAL_TABLET | Freq: Three times a day (TID) | ORAL | Status: DC
  Filled 2022-07-16: qty 3

## 2022-07-16 MED ORDER — SORBITOL 70 % SOLN
30.0000 mL | Freq: Once | Status: AC
  Administered 2022-07-16: 30 mL via ORAL
  Filled 2022-07-16: qty 30

## 2022-07-16 MED ORDER — CARVEDILOL 3.125 MG PO TABS
3.1250 mg | ORAL_TABLET | Freq: Two times a day (BID) | ORAL | Status: DC
  Administered 2022-07-16 – 2022-07-22 (×13): 3.125 mg via ORAL
  Filled 2022-07-16 (×13): qty 1

## 2022-07-16 MED ORDER — BUSPIRONE HCL 10 MG PO TABS
30.0000 mg | ORAL_TABLET | Freq: Three times a day (TID) | ORAL | Status: DC
  Administered 2022-07-16 – 2022-07-18 (×7): 30 mg
  Filled 2022-07-16 (×6): qty 3

## 2022-07-16 NOTE — Progress Notes (Signed)
Patient transported on vent from CT to [id number] without complications.

## 2022-07-16 NOTE — Progress Notes (Signed)
ANTICOAGULATION CONSULT NOTE  Pharmacy Consult for heparin infusion Indication:  Afib with possible ACS  Allergies  Allergen Reactions   Zoloft [Sertraline Hcl] Nausea Only        Amoxapine And Related Rash   Cephalexin Rash   Oxycodone     "Loopy"   Penicillins Rash    Patient Measurements: Height: '5\' 1"'$  (154.9 cm) Weight: 69.2 kg (152 lb 8.9 oz) IBW/kg (Calculated) : 47.8 Heparin Dosing Weight: 60kg  Vital Signs: Temp: 99 F (37.2 C) (01/26 0743) BP: 126/52 (01/26 0745) Pulse Rate: 98 (01/26 0745)  Labs: Recent Labs    07/14/22 0354 07/14/22 1100 07/14/22 2044 07/15/22 0432 07/15/22 1611 07/15/22 2341 07/16/22 0425  HGB 13.1  --  11.2* 11.4*  --   --  11.0*  HCT 36.8  --  33.0* 33.0*  --   --  32.7*  PLT 165  --   --  109*  --   --  122*  HEPARINUNFRC 0.48   < >  --  0.15* 0.26* 0.32 0.33  CREATININE 1.12*  --   --  0.68  --   --  0.68   < > = values in this interval not displayed.     Estimated Creatinine Clearance: 55.8 mL/min (by C-G formula based on SCr of 0.68 mg/dL).   Assessment: 72yo female had witnessed arrest at home, required four rounds and shock x2 before ROSC, to begin heparin for Afib and possible ACS.  Heparin level within goal range this morning.  CBC fairly stable, no overt bleeding or complications noted.  Goal of Therapy:  Heparin level 0.3-0.7 units/ml Monitor platelets by anticoagulation protocol: Yes   Plan:  Continue IV heparin 1300 units/hr. Daily heparin level and CBC F/u plans for cath eventually pending neuro status.  [place] Crane, Roylene Reason, BCCP Clinical Pharmacist  07/16/2022 8:20 AM   [facility] pharmacy phone numbers are listed on [email or web address]

## 2022-07-16 NOTE — TOC Initial Note (Signed)
Transition of Care [facility]) - Initial/Assessment Note    Patient Details  Name: Carmen Green MRN: [id number] Date of Birth: 1950-08-08  Transition of Care [facility]) CM/SW Contact:    Erenest Rasher, RN Phone Number: [phone] 07/16/2022, 4:05 PM  Clinical Narrative:                 Pt from home with husband. Will continue to follow for for dc needs.   Expected Discharge Plan: IP Rehab Facility Barriers to Discharge: Continued Medical Work up   Patient Goals and CMS Choice   CMS Medicare.gov Compare Post Acute Care list provided to:: Patient Represenative (must comment)        Expected Discharge Plan and Services       Living arrangements for the past 2 months: [place]                                      Prior Living Arrangements/Services Living arrangements for the past 2 months: [place] Lives with:: Spouse Patient language and need for interpreter reviewed:: Yes              Criminal Activity/Legal Involvement Pertinent to Current Situation/Hospitalization: No - Comment as needed  Activities of Daily Living      Permission Sought/Granted Permission sought to share information with : Case Manager, Family Supports Permission granted to share information with : Yes, Verbal Permission Granted  Share Information with NAME: Aesha, Agrawal     Permission granted to share info w Relationship: husband  Permission granted to share info w Contact Information: [phone]  Emotional Assessment              Admission diagnosis:  Cardiac arrest [facility]) [I46.9] Patient Active Problem List   Diagnosis Date Noted   Encephalopathy acute 07/14/2022   On mechanically assisted ventilation ([place]) 07/14/2022   AKI (acute kidney injury) ([place]) 07/14/2022   Cardiac arrest ([place]) 07/12/2022   Cardiogenic shock ([place]) 07/12/2022   Acute on chronic HFrEF (heart failure with reduced ejection fraction) ([place]) 07/12/2022   Acute respiratory  failure with hypoxia ([place]) 07/12/2022   Primary osteoarthritis involving multiple joints 04/07/2018   Closed fracture of proximal end of right humerus 09/24/17 10/17/2017   Allergic rhinitis due to pollen 01/26/2017   Body mass index 29.0-29.9, adult 03/02/2016   Palpitation 07/13/2015   Ovarian abscess due to diverticulitis s/p LSO 11/15/2013 11/16/2013   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 11/15/2013 09/13/2013   CHEST PAIN-UNSPECIFIED 07/03/2010   CARDIAC MURMUR 05/22/2010   Generalized anxiety disorder with panic attacks 11/07/2008   Complete heart block ([place]) 11/07/2008   COLONIC POLYPS, HX OF 06/28/2008   Hyperlipidemia 06/25/2008   Essential hypertension 06/25/2008   Mitral valve disorder 06/25/2008   Cardiomyopathy ([place]) 06/25/2008   Gastroesophageal reflux disease without esophagitis 06/25/2008   HIATAL HERNIA 06/25/2008   PCP:  [name], DO Pharmacy:   [place], [place] [address] Phone: [phone] Fax: [place], [address] [place] [address] Phone: [phone] Fax: [phone]  [facility]) [place], [place] [address] Phone: [phone] Fax: [phone]  Social Determinants of Health (SDOH) Social History: SDOH Screenings   Food Insecurity: No Food Insecurity (01/08/2022)  Housing: Low Risk  (01/08/2022)  Transportation Needs: No Transportation Needs (01/08/2022)  Alcohol Screen: Low Risk  (01/08/2022)  Depression (PHQ2-9): Low Risk  (06/16/2022)  Financial Resource Strain: Low Risk  (01/08/2022)  Physical Activity: Insufficiently Active (01/08/2022)  Social Connections: Socially Integrated (01/08/2022)  Stress: No Stress Concern Present (01/08/2022)  Tobacco Use: Low Risk  (07/12/2022)   SDOH  Interventions:     Readmission Risk Interventions     No data to display

## 2022-07-16 NOTE — Progress Notes (Signed)
Patient transported on vent with 100% FIO2 to CT without complications.

## 2022-07-16 NOTE — Progress Notes (Signed)
Advanced Heart Failure Rounding Note  PCP-Cardiologist: None   Subjective:    Remains intubated. TTM, temp 37.3. Shivering. Not following commands but withdrawals to painful stimuli . On propofol 5.   No current pressor requirements. Co-ox 81%. CVP 7   Continues on cefepime for aspiration PNA. Tracheal aspirate + serratia. BCx NGTD    Objective:   Weight Range: 69.2 kg Body mass index is 28.83 kg/m.   Vital Signs:   Temp:  [98.2 F (36.8 C)-99.5 F (37.5 C)] 99 F (37.2 C) (01/26 0743) Pulse Rate:  [78-106] 98 (01/26 0745) Resp:  [12-21] 16 (01/26 0745) BP: (109-146)/(50-91) 126/52 (01/26 0745) SpO2:  [97 %-100 %] 100 % (01/26 0745) FiO2 (%):  [40 %] 40 % (01/26 0745) Weight:  [69.2 kg] 69.2 kg (01/26 0457) Last BM Date :  (pta)  Weight change: Filed Weights   07/14/22 0500 07/15/22 0449 07/16/22 0457  Weight: 68.7 kg 69.1 kg 69.2 kg    Intake/Output:   Intake/Output Summary (Last 24 hours) at 07/16/2022 0758 Last data filed at 07/16/2022 8938 Gross per 24 hour  Intake 1598.49 ml  Output 1025 ml  Net 573.49 ml      Physical Exam   CVP 7  General:  Intubated, withdrawals to painful stimuli  HEENT: + ETT Neck: supple. no JVD. Carotids 2+ bilat; no bruits. No lymphadenopathy or thryomegaly appreciated. Cor: PMI nondisplaced. Regular rate & rhythm. No rubs, gallops or murmurs. L upper chest PPM R upper chest PPM abandoned Lungs: intubated and clear Abdomen: soft, nontender, nondistended. No hepatosplenomegaly. No bruits or masses. Good bowel sounds. Extremities: no cyanosis, clubbing, rash, edema Neuro:Intubated. Withdrawing to pain. Not following commands   Telemetry   A- Paced 90s  EKG    N/A  Labs    CBC Recent Labs    07/15/22 0432 07/16/22 0425  WBC 11.2* 14.7*  HGB 11.4* 11.0*  HCT 33.0* 32.7*  MCV 95.7 97.9  PLT 109* 101*   Basic Metabolic Panel Recent Labs    07/15/22 0432 07/15/22 1611 07/16/22 0425  NA 132*  --  132*   K 3.4*  --  5.0  CL 98  --  103  CO2 23  --  22  GLUCOSE 129*  --  128*  BUN 22  --  26*  CREATININE 0.68  --  0.68  CALCIUM 8.5*  --  8.9  MG 2.0 2.0 2.0  PHOS 2.6 2.5 2.8   Liver Function Tests Recent Labs    07/15/22 0432 07/16/22 0425  AST 146* 98*  ALT 234* 174*  ALKPHOS 53 55  BILITOT 1.1 0.9  PROT 5.8* 6.0*  ALBUMIN 3.1* 3.1*   No results for input(s): "LIPASE", "AMYLASE" in the last 72 hours. Cardiac Enzymes No results for input(s): "CKTOTAL", "CKMB", "CKMBINDEX", "TROPONINI" in the last 72 hours.  BNP: BNP (last 3 results) Recent Labs    07/12/22 0057  BNP 547.6*    ProBNP (last 3 results) Recent Labs    06/28/22 1040  PROBNP 3,230*     D-Dimer No results for input(s): "DDIMER" in the last 72 hours. Hemoglobin A1C No results for input(s): "HGBA1C" in the last 72 hours. Fasting Lipid Panel Recent Labs    07/16/22 0425  TRIG 86   Thyroid Function Tests No results for input(s): "TSH", "T4TOTAL", "T3FREE", "THYROIDAB" in the last 72 hours.  Invalid input(s): "FREET3"  Other results:   Imaging    No results found.   Medications:  Scheduled Medications:  aspirin  81 mg Per Tube Daily   Chlorhexidine Gluconate Cloth  6 each Topical Daily   docusate  100 mg Per Tube BID   famotidine  20 mg Per Tube BID   feeding supplement (PROSource TF20)  60 mL Per Tube Daily   mouth rinse  15 mL Mouth Rinse Q2H   polyethylene glycol  17 g Per Tube Daily    Infusions:  sodium chloride Stopped (07/13/22 1748)   sodium chloride 10 mL/hr at 07/16/22 0304   amiodarone 30 mg/hr (07/16/22 0623)   ceFEPime (MAXIPIME) IV Stopped (07/16/22 0540)   feeding supplement (VITAL 1.5 CAL) 30 mL/hr at 07/16/22 7062   heparin 1,300 Units/hr (07/16/22 0623)   propofol (DIPRIVAN) infusion 15 mcg/kg/min (07/16/22 0623)    PRN Medications: sodium chloride, acetaminophen **OR** acetaminophen (TYLENOL) oral liquid 160 mg/5 mL **OR** acetaminophen, fentaNYL  (SUBLIMAZE) injection, ondansetron (ZOFRAN) IV, mouth rinse    Patient Profile   72 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  Assessment/Plan   1. PEA Arrest--> V Fib--> shock  - Witnessed arrest. CPR/EPI/Shock x2 with ROSC Unclear etiology/ Suspect ischemic event.  TSH ok. HIV NR  Echo 06/1622 recently noted to be  25-30% with WMA + mildly reduced RV, and dilated LA.  - Lactic Acid 2.6>4.3 ->1.8 - HS Trop 463-->753 .Will need cath once stabilized  - LFTs trending down. Elevated suspect in the setting of shock.  - CT head - negative. Unable to get MRI due to abandoned PPM leads  - EEG-diffuse slowing. No seizures.  - Off Norepi. Maps stable. CO-OX 81% - CVP 7, no diuretic requirement currently  - Discussed sedation wean with nurses; if she appears to be neurologically intact plan for LHC/RHC   2. Acute Hypoxic Respiratory Failure - Intubated per CCM    3. Acute/Chronic HFrEF Etiology unclear. Echo as noted about EF 25-30% with WMA.  - Will need eventual cath. Severe MR on ECHO possible valvular disease. Also significant RV pacing.  - POCUS- IVC not dilated. CVP 6-7  - no ARB/ MRA yet w/ elevated K (5.0)  - Avoid SGLT2i with cystitis 2 occasions last year.  - may consider addition of low dose ? blocker soon    4.  A flutter  - Continue amio drip.  - Continue heparin drip.  - Pacing at 90 BPM   5 . AKI -Resolved.    6.  PVCs -Frequent PVCs -Continue amio drip.    7.  MR, severe on Echo  - Once stabilized will need TEE   8.  H/O CHB--> St Jude PPM  Gen changed 2004, 2012.  Abandoned R side device. L sided device implanted 2021.  EP appreciated. Pacer rate increased to 90.    9. Aspiration PNA  - Tracheal aspirate + serratia. BCx NGTD  - continue cefepime   10.  DNR    Length of Stay: [address] PA-C  07/16/2022, 7:58 AM  Advanced Heart Failure  Team Pager [phone] (M-F; 7a - 5p)  Please contact [facility] for night-coverage after hours (5p -7a ) and weekends on [email or web address] \

## 2022-07-16 NOTE — Progress Notes (Signed)
[place] for heparin  Indication:  Afib with possible ACS Brief A/P: Heparin level within goal range Continue Heparin at current rate   Allergies  Allergen Reactions   Zoloft [Sertraline Hcl] Nausea Only        Amoxapine And Related Rash   Cephalexin Rash   Oxycodone     "Loopy"   Penicillins Rash    Patient Measurements: Height: '5\' 1"'$  (154.9 cm) Weight: 69.1 kg (152 lb 5.4 oz) IBW/kg (Calculated) : 47.8 Heparin Dosing Weight: 60kg  Vital Signs: Temp: 98.6 F (37 C) (01/26 0000) BP: 140/80 (01/25 2300) Pulse Rate: 99 (01/25 2300)  Labs: Recent Labs    07/13/22 0610 07/13/22 0915 07/13/22 1715 07/13/22 2102 07/14/22 0354 07/14/22 1100 07/14/22 2044 07/15/22 0432 07/15/22 1611 07/15/22 2341  HGB 13.1  --  13.6  --  13.1  --  11.2* 11.4*  --   --   HCT 38.2  --  39.2  --  36.8  --  33.0* 33.0*  --   --   PLT 168  --  155  --  165  --   --  109*  --   --   HEPARINUNFRC  --    < >  --    < > 0.48   < >  --  0.15* 0.26* 0.32  CREATININE  --    < > 0.99  --  1.12*  --   --  0.68  --   --   TROPONINIHS 830*  --   --   --   --   --   --   --   --   --    < > = values in this interval not displayed.     Estimated Creatinine Clearance: 55.7 mL/min (by C-G formula based on SCr of 0.68 mg/dL).   Assessment: 72 y.o. female with Afib for heparin  Goal of Therapy:  Heparin level 0.3-0.7 units/ml Monitor platelets by anticoagulation protocol: Yes   Plan:  Continue Heparin at current rate  Phillis Knack, PharmD, BCPS  07/16/2022 1:41 AM

## 2022-07-16 NOTE — Plan of Care (Signed)
  Problem: Education: Goal: Understanding of CV disease, CV risk reduction, and recovery process will improve Outcome: Progressing Goal: Individualized Educational Video(s) Outcome: Progressing   Problem: Activity: Goal: Ability to return to baseline activity level will improve Outcome: Progressing   Problem: Cardiovascular: Goal: Ability to achieve and maintain adequate cardiovascular perfusion will improve Outcome: Progressing Goal: Vascular access site(s) Level 0-1 will be maintained Outcome: Progressing   Problem: Health Behavior/Discharge Planning: Goal: Ability to safely manage health-related needs after discharge will improve Outcome: Progressing   Problem: Education: Goal: Ability to manage disease process will improve Outcome: Progressing   Problem: Cardiac: Goal: Ability to achieve and maintain adequate cardiopulmonary perfusion will improve Outcome: Progressing   Problem: Neurologic: Goal: Promote progressive neurologic recovery Outcome: Progressing   Problem: Skin Integrity: Goal: Risk for impaired skin integrity will be minimized. Outcome: Progressing   Problem: Education: Goal: Knowledge of General Education information will improve Description: Including pain rating scale, medication(s)/side effects and non-pharmacologic comfort measures Outcome: Progressing   Problem: Health Behavior/Discharge Planning: Goal: Ability to manage health-related needs will improve Outcome: Progressing   Problem: Clinical Measurements: Goal: Ability to maintain clinical measurements within normal limits will improve Outcome: Progressing Goal: Will remain free from infection Outcome: Progressing Goal: Diagnostic test results will improve Outcome: Progressing Goal: Respiratory complications will improve Outcome: Progressing Goal: Cardiovascular complication will be avoided Outcome: Progressing   Problem: Activity: Goal: Risk for activity intolerance will  decrease Outcome: Progressing   Problem: Nutrition: Goal: Adequate nutrition will be maintained Outcome: Progressing   Problem: Coping: Goal: Level of anxiety will decrease Outcome: Progressing   Problem: Elimination: Goal: Will not experience complications related to bowel motility Outcome: Progressing Goal: Will not experience complications related to urinary retention Outcome: Progressing   Problem: Pain Managment: Goal: General experience of comfort will improve Outcome: Progressing   Problem: Safety: Goal: Ability to remain free from injury will improve Outcome: Progressing   Problem: Skin Integrity: Goal: Risk for impaired skin integrity will decrease Outcome: Progressing

## 2022-07-16 NOTE — Progress Notes (Addendum)
NAME:  Carmen Green, MRN:  [id number], DOB:  April 14, 1951, LOS: 4 ADMISSION DATE:  07/12/2022, CONSULTATION DATE: 1/22 REFERRING MD: Dr. Darl Householder EDP, CHIEF COMPLAINT: Cardiac arrest  History of Present Illness:  72 year old female with past medical history as below, which is significant for complete heart block status post pacemaker, mitral valve prolapse, GERD, and esophagitis.  She is also recently been undergoing additional cardiac workup after presenting to the cardiology office on 1/8 with new complaints of shortness of breath and fatigue.  Echocardiogram done 1/16 demonstrated newly reduced LVEF to 25% with severe mitral regurgitation.  There were plans for her to undergo TEE and left/right heart cath, however, she unfortunately presented to [facility] emergency department on 1/22 after suffering cardiac arrest at home at approximately 1830.  Bystanders report she fell to the ground.  Sounds like she did receive bystander CPR and first responders placed AED which did not advise shock.  After EMS arrived and placed cardiac monitor she was in PEA.  Rhythm did eventually changed to V-fib at which point she was defibrillated and ROSC was achieved.  King airway was exchanged for endotracheal tube in the emergency department.  Patient was hypotensive requiring norepinephrine infusion.  EKG with nonspecific possibly ischemic changes.  Cardiology was consulted by the EDP.  PCCM has been asked to admit.  Pertinent  Medical History   has a past medical history of Allergy, Anemia, Anxiety, Arthritis, Cataract, Colon polyp, Complete heart block ([place]), Contusion of chest wall (10/10/2007), Depression, Diverticulitis, Esophagitis, ESOPHAGITIS (06/25/2008), GERD (gastroesophageal reflux disease), H/O seasonal allergies, Heart murmur, Hiatal hernia, Hyperlipidemia, Hypertension, Mitral valve prolapse, and Pacemaker.   Significant Hospital Events: Including procedures, antibiotic start and stop dates in addition to  other pertinent events   1/23 Admission following cardiac arrest; EP consulted for runs of ventricular ectopy and turned pacer up to 90 with significant improvement; Echo LVEF <20% w/ severe MR 1/24 off levo; WBC trending up and tmax 99.3, cooling per TTM protocol; cultures sent and started on broad spectrum abx 1/25 serratia in trach cx> ctx> cefepime, weaning but mental status remains slow to improve  Interim History / Subjective:  Placed back on low dose propofol yest afternoon for vent dyssynchrony after fatiguing on PSV.   Continues to have low temps on cooling blanket, tmax 99.5   Objective   Blood pressure (!) 126/52, pulse 98, temperature 99 F (37.2 C), resp. rate 16, height '5\' 1"'$  (1.549 m), weight 69.2 kg, SpO2 100 %. CVP:  [5 mmHg-8 mmHg] 8 mmHg  Vent Mode: CPAP;PSV FiO2 (%):  [40 %] 40 % Set Rate:  [14 bmp] 14 bmp Vt Set:  [380 mL] 380 mL PEEP:  [5 cmH20] 5 cmH20 Pressure Support:  [5 cmH20-8 cmH20] 5 cmH20 Plateau Pressure:  [17 cmH20] 17 cmH20   Intake/Output Summary (Last 24 hours) at 07/16/2022 0803 Last data filed at 07/16/2022 4097 Gross per 24 hour  Intake 1598.49 ml  Output 1025 ml  Net 573.49 ml   Filed Weights   07/14/22 0500 07/15/22 0449 07/16/22 0457  Weight: 68.7 kg 69.1 kg 69.2 kg    Examination: Propofol 15 General:  Older female lying in bed in NAD HEENT: MM pink/moist, pupils 4/reactive, anicteric  Neuro:  sedated, does not open eyes or f/c, flicker in LE to noxious stimuli otherwise unresponsive, +gag/ cough  CV: rr ir, mostly paced PULM:  occasional dyssynchrony on PCV, flipped to PSV 5/5 with TV's 900s, scattered rhonchi which clears with suctioning, thick  pale tan secretions GI: soft, bs+, ND, NT, foley Extremities: warm/dry, no LE edema  Skin: no rashes, skin tear left forearm  1/24 Trach cx > serratia>  1/24 BC> ngtd 1/24 UC> neg  Labs> coox 81, Na 132, K 5, sCr 0.68, Mag 2, LFTs cont down trending, triglycerides 86, WBC 11> 14.7, H/H  13.1/ 37> 11.4/33> 11/ 33, plts 109> 122  CVP 3 UOP 1L/ 24hrs net +569m Net +2.7  Resolved Hospital Problem list     Assessment & Plan:   OOH arrest: Initially VT> PEA >VT which was shocked. ROSC unclear, possibly around 337ms.  Suspect in setting that was cardiac in nature given recent change in cardiac history and sudden collapse.  EKG in the ED with potential signs of ischemia, however, in the immediate postarrest setting the significance is uncertain. Plan: - cont telemetry monitoring - avoid fever, trend WBC/ fever curve/ culture data - cont supportive measures  - continue heparin for ischemia and a flutter - R/LHC prior to extubation pending neuro improvement  Acute hypoxemic respiratory failure Pulmonary edema Serratia PNA Plan: - continues to wean well but mental status remains barrier to extubation - cont to minimize sedation, needs more time for neuro prognostication.  Requiring low dose propofol for vent dyssynchrony when on full support.  PRN fentanyl   - full LTVV support/ daily SBT> on PCV given vent dyssynchrony.   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 Jude pacemaker Plan: - appreciate HF/ EP recs -  PPM adjusted 60> 90 with improvement in PVCs - hemodynamically stable off inotropic support - add back GMDT as tolerated - heparin per pharmacy - amio gtt - coox  remains reassuring.  Stop checking - K> 4, Mag > 2 - R/LHC prior to extubation when/ if pending neuro recovery  Sepsis secondary to serratia PNA - remains off pressors - abx as above - follow  culture data - avoid fever   Prolonged QTC Plan: - tele - maximize electrolytes, k goal >4; mag >2 - avoid qtc prolonging agents  R rib fx: s/p CPR Plan: - supportive care  AKI, resolved Hypokalemia, resolved Plan: -Trend BMP / urinary output -Replace electrolytes as indicated -Avoid nephrotoxic agents, ensure adequate renal perfusion  Elevated LFTs: trending down Plan: - trend LFTs, improving  HTN HLD Plan: - hold statin while LFTs elevated - hold anti-hypertensives,  Anxiety Depression Plan: - hold home meds.  Is on ativan prn.  Once off propofol, need to monitor for possible w/d  Best Practice (right click and "Reselect all SmartList Selections" daily)   Diet/type: NPO; TF  DVT prophylaxis: systemic heparin GI prophylaxis: H2B Lines: Central line Foley:  Yes, and it is still needed Code Status:  DNR Last date of multidisciplinary goals of care discussion [1/24 updated husband at bedside]  Husband updated 1/25 at bedside.  Pending 1/26  Labs   CBC: Recent Labs  Lab  07/12/22 0057 07/12/22 1945 07/12/22 1951 07/13/22 0610 07/13/22 1715 07/14/22 0354 07/14/22 2044 07/15/22 0432 07/16/22 0425  WBC 7.9 12.3*  --  12.9* 14.8* 16.3*  --  11.2* 14.7*  NEUTROABS 6.4 8.5*  --   --   --   --   --   --   --   HGB 12.9 13.5   < > 13.1 13.6 13.1 11.2* 11.4* 11.0*  HCT 36.9 40.4   < > 38.2 39.2 36.8 33.0* 33.0* 32.7*  MCV 94.6 99.3  --  96.2 94.9 93.4  --  95.7 97.9  PLT 201 177  --  168 155 165  --  109* 122*   < > = values in this interval not displayed.    Basic Metabolic Panel: Recent Labs  Lab 07/13/22 0915 07/13/22 1715 07/14/22 0354 07/14/22 2044 07/15/22 0432 07/15/22 1611 07/16/22 0425  NA 133* 131* 130* 132* 132*  --  132*  K 4.6 3.3* 4.9 4.1  3.4*  --  5.0  CL 100 100 101  --  98  --  103  CO2 19* 24 23  --  23  --  22  GLUCOSE 109* 150* 130*  --  129*  --  128*  BUN '22 18 18  '$ --  22  --  26*  CREATININE 1.32* 0.99 1.12*  --  0.68  --  0.68  CALCIUM 8.6* 8.2* 8.2*  --  8.5*  --  8.9  MG 1.9 1.8 2.7*  --  2.0 2.0 2.0  PHOS 4.3  --  2.5  --  2.6 2.5 2.8   GFR: Estimated Creatinine Clearance: 55.8 mL/min (by C-G formula based on SCr of 0.68 mg/dL). Recent Labs  Lab 07/13/22 0610 07/13/22 0915 07/13/22 1715 07/14/22 0354 07/14/22 1330 07/14/22 2051 07/15/22 0432 07/16/22 0425  WBC 12.9*  --  14.8* 16.3*  --   --  11.2* 14.7*  LATICACIDVEN 2.6* 4.3*  --   --  2.3* 1.8  --   --     Liver Function Tests: Recent Labs  Lab 07/12/22 1945 07/13/22 0915 07/14/22 0354 07/15/22 0432 07/16/22 0425  AST 717* 620* 251* 146* 98*  ALT 627* 609* 369* 234* 174*  ALKPHOS 77 77 61 53 55  BILITOT 0.9 0.7 0.7 1.1 0.9  PROT 6.4* 7.1 6.2* 5.8* 6.0*  ALBUMIN 3.7 4.3 3.5 3.1* 3.1*   No results for input(s): "LIPASE", "AMYLASE" in the last 168 hours. Recent Labs  Lab 07/14/22 1033  AMMONIA 26    ABG    Component Value Date/Time   PHART 7.428 07/14/2022 2044   PCO2ART 34.7 07/14/2022 2044   PO2ART 119 (H) 07/14/2022 2044   HCO3 22.9 07/14/2022 2044   TCO2 24 07/14/2022 2044   ACIDBASEDEF 1.0 07/14/2022 2044   O2SAT 81.1 07/16/2022 0425     Coagulation Profile: No results for input(s): "INR", "PROTIME" in the last 168 hours.  Cardiac Enzymes: No results for input(s): "CKTOTAL", "CKMB", "CKMBINDEX", "TROPONINI" in the last 168 hours.  HbA1C: No results found for: "HGBA1C"  CBG: Recent Labs  Lab 07/15/22 1116 07/15/22 1541 07/15/22 2023 07/15/22 2344 07/16/22 0745  GLUCAP 103* 93 115* 118* 138*     Critical care time: 36 minutes     Amanda Cockayne [facility] 07/16/2022, 8:03 AM  See Amion for pager If no response to pager, please call PCCM consult pager After 7:00 pm  call Elink

## 2022-07-17 DIAGNOSIS — I469 Cardiac arrest, cause unspecified: Secondary | ICD-10-CM | POA: Diagnosis not present

## 2022-07-17 LAB — CBC
HCT: 28.7 % — ABNORMAL LOW (ref 36.0–46.0)
Hemoglobin: 9.8 g/dL — ABNORMAL LOW (ref 12.0–15.0)
MCH: 33.7 pg (ref 26.0–34.0)
MCHC: 34.1 g/dL (ref 30.0–36.0)
MCV: 98.6 fL (ref 80.0–100.0)
Platelets: 118 10*3/uL — ABNORMAL LOW (ref 150–400)
RBC: 2.91 MIL/uL — ABNORMAL LOW (ref 3.87–5.11)
RDW: 12.8 % (ref 11.5–15.5)
WBC: 12.5 10*3/uL — ABNORMAL HIGH (ref 4.0–10.5)
nRBC: 0 % (ref 0.0–0.2)

## 2022-07-17 LAB — BASIC METABOLIC PANEL
Anion gap: 6 (ref 5–15)
BUN: 24 mg/dL — ABNORMAL HIGH (ref 8–23)
CO2: 22 mmol/L (ref 22–32)
Calcium: 8.8 mg/dL — ABNORMAL LOW (ref 8.9–10.3)
Chloride: 108 mmol/L (ref 98–111)
Creatinine, Ser: 0.73 mg/dL (ref 0.44–1.00)
GFR, Estimated: >60 mL/min (ref >60)
Glucose, Bld: 169 mg/dL — ABNORMAL HIGH (ref 70–99)
Potassium: 3.9 mmol/L (ref 3.5–5.1)
Sodium: 136 mmol/L (ref 135–145)

## 2022-07-17 LAB — GLUCOSE, CAPILLARY
Glucose-Capillary: 138 mg/dL — ABNORMAL HIGH (ref 70–99)
Glucose-Capillary: 145 mg/dL — ABNORMAL HIGH (ref 70–99)
Glucose-Capillary: 133 mg/dL — ABNORMAL HIGH (ref 70–99)
Glucose-Capillary: 167 mg/dL — ABNORMAL HIGH (ref 70–99)
Glucose-Capillary: 154 mg/dL — ABNORMAL HIGH (ref 70–99)

## 2022-07-17 LAB — MAGNESIUM: Magnesium: 2 mg/dL (ref 1.7–2.4)

## 2022-07-17 LAB — HEPARIN LEVEL (UNFRACTIONATED): Heparin Unfractionated: 0.27 IU/mL — ABNORMAL LOW (ref 0.30–0.70)

## 2022-07-17 LAB — HEPATIC FUNCTION PANEL
ALT: 111 U/L — ABNORMAL HIGH (ref 0–44)
AST: 61 U/L — ABNORMAL HIGH (ref 15–41)
Albumin: 2.6 g/dL — ABNORMAL LOW (ref 3.5–5.0)
Alkaline Phosphatase: 49 U/L (ref 38–126)
Bilirubin, Direct: 0.1 mg/dL (ref 0.0–0.2)
Indirect Bilirubin: 0.5 mg/dL (ref 0.3–0.9)
Total Bilirubin: 0.6 mg/dL (ref 0.3–1.2)
Total Protein: 5.3 g/dL — ABNORMAL LOW (ref 6.5–8.1)

## 2022-07-17 MED ORDER — LOSARTAN POTASSIUM 25 MG PO TABS
12.5000 mg | ORAL_TABLET | Freq: Every day | ORAL | Status: DC
  Administered 2022-07-18 – 2022-07-21 (×4): 12.5 mg via ORAL
  Filled 2022-07-17 (×6): qty 1

## 2022-07-17 MED ORDER — POTASSIUM CHLORIDE 20 MEQ PO PACK
20.0000 meq | PACK | Freq: Once | ORAL | Status: AC
  Administered 2022-07-17: 20 meq
  Filled 2022-07-17: qty 1

## 2022-07-17 MED ORDER — DEXMEDETOMIDINE HCL IN NACL 400 MCG/100ML IV SOLN
0.0000 ug/kg/h | INTRAVENOUS | Status: DC
  Administered 2022-07-17: 0.4 ug/kg/h via INTRAVENOUS
  Administered 2022-07-18: 0.2 ug/kg/h via INTRAVENOUS
  Administered 2022-07-19: 0.3 ug/kg/h via INTRAVENOUS
  Filled 2022-07-17 (×3): qty 100

## 2022-07-17 NOTE — Progress Notes (Signed)
Advanced Heart Failure Rounding Note  PCP-Cardiologist: None   Subjective:   - Intubated on pressure support; triggering vent.  -CVP 7 -Starting to wake up, move extremities.    Objective:   Weight Range: 66.9 kg Body mass index is 27.87 kg/m.   Vital Signs:   Temp:  [99 F (37.2 C)-101.1 F (38.4 C)] 99.1 F (37.3 C) (01/27 0829) Pulse Rate:  [69-104] 95 (01/27 0829) Resp:  [13-25] 18 (01/27 0829) BP: (108-141)/(41-91) 133/54 (01/27 0829) SpO2:  [88 %-100 %] 100 % (01/27 0829) FiO2 (%):  [40 %-100 %] 40 % (01/27 0829) Weight:  [66.9 kg] 66.9 kg (01/27 0442) Last BM Date :  (pta)  Weight change: Filed Weights   07/15/22 0449 07/16/22 0457 07/17/22 0442  Weight: 69.1 kg 69.2 kg 66.9 kg    Intake/Output:   Intake/Output Summary (Last 24 hours) at 07/17/2022 1023 Last data filed at 07/17/2022 1000 Gross per 24 hour  Intake 2224.94 ml  Output 1223 ml  Net 1001.94 ml       Physical Exam   CVP 7-9 General:  Intubated, withdrawals to painful stimuli  HEENT: + ETT Neck: supple. no JVD. Carotids 2+ bilat; no bruits. No lymphadenopathy or thryomegaly appreciated. Cor: PMI nondisplaced. Regular rate & rhythm. No rubs, gallops or murmurs. L upper chest PPM R upper chest PPM abandoned Lungs: intubated and clear Abdomen: soft, nontender, nondistended. No hepatosplenomegaly. No bruits or masses. Good bowel sounds. Extremities: no cyanosis, clubbing, rash, edema Neuro:Intubated. Withdrawing to pain, moving extremities.   Telemetry   A- Paced 90s  EKG    N/A  Labs    CBC Recent Labs    07/16/22 0425 07/17/22 0454  WBC 14.7* 12.5*  HGB 11.0* 9.8*  HCT 32.7* 28.7*  MCV 97.9 98.6  PLT 122* 118*    Basic Metabolic Panel Recent Labs    07/15/22 1611 07/16/22 0425 07/17/22 0454  NA  --  132* 136  K  --  5.0 3.9  CL  --  103 108  CO2  --  22 22  GLUCOSE  --  128* 169*  BUN  --  26* 24*  CREATININE  --  0.68 0.73  CALCIUM  --  8.9 8.8*  MG 2.0  2.0 2.0  PHOS 2.5 2.8  --     Liver Function Tests Recent Labs    07/16/22 0425 07/17/22 0454  AST 98* 61*  ALT 174* 111*  ALKPHOS 55 49  BILITOT 0.9 0.6  PROT 6.0* 5.3*  ALBUMIN 3.1* 2.6*     BNP: BNP (last 3 results) Recent Labs    07/12/22 0057  BNP 547.6*     ProBNP (last 3 results) Recent Labs    06/28/22 1040  PROBNP 3,230*    Fasting Lipid Panel Recent Labs    07/16/22 0425  TRIG 86     Imaging    CT HEAD WO CONTRAST (5MM)  Result Date: 07/16/2022 CLINICAL DATA:  Initial evaluation for mental status change, unknown cause. EXAM: CT HEAD WITHOUT CONTRAST TECHNIQUE: Contiguous axial images were obtained from the base of the skull through the vertex without intravenous contrast. RADIATION DOSE REDUCTION: This exam was performed according to the departmental dose-optimization program which includes automated exposure control, adjustment of the mA and/or kV according to patient size and/or use of iterative reconstruction technique. COMPARISON:  Prior CT from 07/13/2022. FINDINGS: Brain: Cerebral volume within normal limits. No acute intracranial hemorrhage. Asymmetric hyperdensity involving the right temporal lobe felt  to be most consistent with beam hardening artifact. No acute large vessel territory infarct. No mass lesion, mass effect or midline shift. No hydrocephalus or extra-axial fluid collection. Vascular: No abnormal hyperdense vessel. Scattered vascular calcifications noted within the carotid siphons. Skull: Scalp soft tissues and calvarium within normal limits. Sinuses/Orbits: Globes orbital soft tissues demonstrate no acute finding. Paranasal sinuses and mastoid air cells are clear. Other: None. IMPRESSION: Negative head CT. No acute intracranial abnormality identified. Electronically Signed   By: Jeannine Boga M.D.   On: 07/16/2022 19:41     Medications:     Scheduled Medications:  aspirin  81 mg Per Tube Daily   busPIRone  30 mg Oral Q8H    Or   busPIRone  30 mg Per Tube Q8H   carvedilol  3.125 mg Oral BID WC   Chlorhexidine Gluconate Cloth  6 each Topical Daily   docusate  100 mg Per Tube BID   famotidine  20 mg Per Tube BID   feeding supplement (PROSource TF20)  60 mL Per Tube Daily   mouth rinse  15 mL Mouth Rinse Q2H   polyethylene glycol  17 g Per Tube Daily    Infusions:  sodium chloride Stopped (07/13/22 1748)   sodium chloride 10 mL/hr at 07/16/22 0304   amiodarone 30 mg/hr (07/17/22 1000)   ceFEPime (MAXIPIME) IV Stopped (07/17/22 0626)   dexmedetomidine (PRECEDEX) IV infusion     feeding supplement (VITAL 1.5 CAL) 45 mL/hr at 07/17/22 1000   heparin 1,400 Units/hr (07/17/22 1000)    PRN Medications: sodium chloride, acetaminophen **OR** acetaminophen (TYLENOL) oral liquid 160 mg/5 mL **OR** acetaminophen, fentaNYL (SUBLIMAZE) injection, ondansetron (ZOFRAN) IV, mouth rinse    Patient Profile   72 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  Assessment/Plan   1. PEA Arrest--> V Fib--> shock  - Witnessed arrest. CPR/EPI/Shock x2 with ROSC Unclear etiology/ Suspect ischemic event.  TSH ok. HIV NR  Echo 06/1622 recently noted to be  25-30% with WMA + mildly reduced RV, and dilated LA.  - Lactic Acid 2.6>4.3 ->1.8 - HS Trop 463-->753 .Will need cath once stabilized  - LFTs trending down. Elevated suspect in the setting of shock.  - Repeat CTH yesterday negative, this AM appears to be waking up and moving extremities. Will continue to monitor.  - sCr wnl now.  - Hypertensive today, start losartan 12.'5mg'$ . Spironolactone to follow.   2. Acute Hypoxic Respiratory Failure - Intubated per CCM  - Possible extubation today or tomorrow.    3. Acute/Chronic HFrEF Etiology unclear. Echo as noted about EF 25-30% with WMA.  - Will need eventual cath. Severe MR on ECHO possible valvular disease. Also significant RV  pacing.  - POCUS- IVC not dilated. CVP 6-7  - no ARB/ MRA yet w/ elevated K (5.0)  - Avoid SGLT2i with cystitis 2 occasions last year.  - coreg 3.'125mg'$  BID   4.  A flutter  - Continue amio drip.  - Continue heparin drip.  - Pacing at 90 BPM   5 . AKI -Resolved.    6.  PVCs -Frequent PVCs -Continue amio drip.    7.  MR, severe on Echo  - Once stabilized will need TEE   8.  H/O CHB--> St Jude PPM  Gen changed 2004, 2012.  Abandoned R side device. L sided device implanted 2021.  EP appreciated. Pacer rate increased to  90.    9. Aspiration PNA  - Tracheal aspirate + serratia. BCx NGTD  - continue cefepime   10.  DNR    Length of Stay: [place], DO  07/17/2022, 10:23 AM  Advanced Heart Failure Team Pager [phone] (M-F; 7a - 5p)  Please contact [facility] for night-coverage after hours (5p -7a ) and weekends on [email or web address]  CRITICAL CARE Performed by: Hebert Soho   Total critical care time: 35 minutes  Critical care time was exclusive of separately billable procedures and treating other patients.  Critical care was necessary to treat or prevent imminent or life-threatening deterioration.  Critical care was time spent personally by me on the following activities: development of treatment plan with patient and/or surrogate as well as nursing, discussions with consultants, evaluation of patient's response to treatment, examination of patient, obtaining history from patient or surrogate, ordering and performing treatments and interventions, ordering and review of laboratory studies, ordering and review of radiographic studies, pulse oximetry and re-evaluation of patient's condition.

## 2022-07-17 NOTE — Plan of Care (Signed)
  Problem: Education: Goal: Understanding of CV disease, CV risk reduction, and recovery process will improve Outcome: Progressing Goal: Individualized Educational Video(s) Outcome: Progressing   Problem: Activity: Goal: Ability to return to baseline activity level will improve Outcome: Progressing   Problem: Cardiovascular: Goal: Ability to achieve and maintain adequate cardiovascular perfusion will improve Outcome: Progressing Goal: Vascular access site(s) Level 0-1 will be maintained Outcome: Progressing   Problem: Health Behavior/Discharge Planning: Goal: Ability to safely manage health-related needs after discharge will improve Outcome: Progressing   Problem: Education: Goal: Ability to manage disease process will improve Outcome: Progressing   Problem: Cardiac: Goal: Ability to achieve and maintain adequate cardiopulmonary perfusion will improve Outcome: Progressing   Problem: Neurologic: Goal: Promote progressive neurologic recovery Outcome: Progressing   Problem: Skin Integrity: Goal: Risk for impaired skin integrity will be minimized. Outcome: Progressing   Problem: Education: Goal: Knowledge of General Education information will improve Description: Including pain rating scale, medication(s)/side effects and non-pharmacologic comfort measures Outcome: Progressing   Problem: Health Behavior/Discharge Planning: Goal: Ability to manage health-related needs will improve Outcome: Progressing   Problem: Clinical Measurements: Goal: Ability to maintain clinical measurements within normal limits will improve Outcome: Progressing Goal: Will remain free from infection Outcome: Progressing Goal: Diagnostic test results will improve Outcome: Progressing Goal: Respiratory complications will improve Outcome: Progressing Goal: Cardiovascular complication will be avoided Outcome: Progressing   Problem: Activity: Goal: Risk for activity intolerance will  decrease Outcome: Progressing   Problem: Nutrition: Goal: Adequate nutrition will be maintained Outcome: Progressing   Problem: Coping: Goal: Level of anxiety will decrease Outcome: Progressing   Problem: Elimination: Goal: Will not experience complications related to bowel motility Outcome: Progressing Goal: Will not experience complications related to urinary retention Outcome: Progressing   Problem: Pain Managment: Goal: General experience of comfort will improve Outcome: Progressing   Problem: Safety: Goal: Ability to remain free from injury will improve Outcome: Progressing   Problem: Skin Integrity: Goal: Risk for impaired skin integrity will decrease Outcome: Progressing

## 2022-07-17 NOTE — Progress Notes (Signed)
NAME:  Carmen Green, MRN:  [id number], DOB:  08/06/1950, LOS: 5 ADMISSION DATE:  07/12/2022, CONSULTATION DATE: 1/22 REFERRING MD: Dr. Darl Householder EDP, CHIEF COMPLAINT: Cardiac arrest  History of Present Illness:  72 year old female with past medical history as below, which is significant for complete heart block status post pacemaker, mitral valve prolapse, GERD, and esophagitis.  She is also recently been undergoing additional cardiac workup after presenting to the cardiology office on 1/8 with new complaints of shortness of breath and fatigue.  Echocardiogram done 1/16 demonstrated newly reduced LVEF to 25% with severe mitral regurgitation.  There were plans for her to undergo TEE and left/right heart cath, however, she unfortunately presented to [facility] emergency department on 1/22 after suffering cardiac arrest at home at approximately 1830.  Bystanders report she fell to the ground.  Sounds like she did receive bystander CPR and first responders placed AED which did not advise shock.  After EMS arrived and placed cardiac monitor she was in PEA.  Rhythm did eventually changed to V-fib at which point she was defibrillated and ROSC was achieved.  King airway was exchanged for endotracheal tube in the emergency department.  Patient was hypotensive requiring norepinephrine infusion.  EKG with nonspecific possibly ischemic changes.  Cardiology was consulted by the EDP.  PCCM has been asked to admit.  Pertinent  Medical History   has a past medical history of Allergy, Anemia, Anxiety, Arthritis, Cataract, Colon polyp, Complete heart block ([place]), Contusion of chest wall (10/10/2007), Depression, Diverticulitis, Esophagitis, ESOPHAGITIS (06/25/2008), GERD (gastroesophageal reflux disease), H/O seasonal allergies, Heart murmur, Hiatal hernia, Hyperlipidemia, Hypertension, Mitral valve prolapse, and Pacemaker.   Significant Hospital Events: Including procedures, antibiotic start and stop dates in addition to  other pertinent events   1/23 Admission following cardiac arrest; EP consulted for runs of ventricular ectopy and turned pacer up to 90 with significant improvement; Echo LVEF <20% w/ severe MR 1/24 off levo; WBC trending up and tmax 99.3, cooling per TTM protocol; cultures sent and started on broad spectrum abx 1/25 serratia in trach cx> ctx> cefepime, weaning but mental status remains slow to improve 1/27 more awake but not consistently following commands.  Started on low-dose Precedex for tube tolerance.  Interim History / Subjective:  Tolerating SBT.  Brighter today.  Appears to recognize family.  Objective   Blood pressure (!) 110/51, pulse 92, temperature 99 F (37.2 C), resp. rate (!) 21, height '5\' 1"'$  (1.549 m), weight 66.9 kg, SpO2 100 %. CVP:  [3 mmHg-8 mmHg] 3 mmHg  Vent Mode: CPAP;PSV FiO2 (%):  [40 %-100 %] 40 % Set Rate:  [14 bmp] 14 bmp PEEP:  [5 cmH20] 5 cmH20 Pressure Support:  [5 cmH20] 5 cmH20 Plateau Pressure:  [17 cmH20-20 cmH20] 19 cmH20   Intake/Output Summary (Last 24 hours) at 07/17/2022 1423 Last data filed at 07/17/2022 1330 Gross per 24 hour  Intake 2468.09 ml  Output 948 ml  Net 1520.09 ml    Filed Weights   07/15/22 0449 07/16/22 0457 07/17/22 0442  Weight: 69.1 kg 69.2 kg 66.9 kg    Examination: General:  Older female lying in bed in NAD HEENT: MM pink/moist, pupils 4/reactive, anicteric  Neuro: Awake and interacts with family moving both limbs semipurposefully. CV: rr ir, mostly paced PULM: Occasional wheezing.  Tolerating SBT. GI: soft, bs+, ND, NT, foley Extremities: warm/dry, no LE edema  Skin: no rashes, skin tear left forearm  Ancillary tests personally reviewed.  Normal electrolytes, normal creatinine Mild leukocytosis 12.5  Mild anemia 9.8 Mild thrombocytopenia 118 Assessment & Plan:   OOH arrest: Initially VT> PEA >VT which was shocked. ROSC unclear, possibly around 11mns.  Suspect in setting that was cardiac in nature given  recent change in cardiac history and sudden collapse.  EKG in the ED with potential signs of ischemia, however, in the immediate postarrest setting the significance is uncertain. Plan: - cont telemetry monitoring - avoid fever, trend WBC/ fever curve/ culture data - cont supportive measures  - continue heparin for ischemia and a flutter - R/LHC prior to extubation pending neuro improvement  Acute hypoxemic respiratory failure Pulmonary edema Serratia PNA Plan: - continues to wean well but mental status remains barrier to extubation -Keep off sedation.  Appears to be making good neurological recovery.  Low-dose Precedex for vent tolerance.   PRN fentanyl   -Continue daily SBT - VAP/ PPI  -Complete 7 days cefepime for Serratia  Acute encephalopathy: concern for anoxic encephalopathy post arrest - CT head no acute abnormality; EEG no seizures - ammonia 26  Plan: - unable to perform MRI due to PPM - likely just needs more time prior to neuro prognostication, remains guarded - Maintain neuro protective measures; goal for eurothermia, euglycemia, eunatermia, normoxia, and PCO2 goal of 35-40 - minimize sedative meds - infectious treatment as above   Shock: cardiogenic with possible sepsis, resolved  Acute on chronic HFrEF: echo lvef <20% w/ MR Severe MR Atrial flutter Frequent PVCs CHB w/ [employer]. Jude pacemaker Plan: - appreciate HF/ EP recs -  PPM adjusted 60> 90 with improvement in PVCs - hemodynamically stable off inotropic support - add back GMDT as tolerated - heparin per pharmacy - amio gtt - coox remains reassuring.  Stop checking - K> 4, Mag > 2 - R/LHC prior to extubation when/ if pending neuro recovery  Sepsis secondary to serratia PNA - remains off pressors - abx as above - follow  culture data - avoid fever   Prolonged QTC Plan: - tele - maximize electrolytes, k goal >4; mag >2 - avoid qtc prolonging agents  R rib fx: s/p CPR Plan: - supportive care  AKI,  resolved Hypokalemia, resolved Plan: -Trend BMP / urinary output -Replace electrolytes as indicated -Avoid nephrotoxic agents, ensure adequate renal perfusion  Elevated LFTs: trending down Plan: - trend LFTs, improving  HTN HLD Plan: - hold statin while LFTs elevated - hold anti-hypertensives,  Anxiety Depression Plan: - hold home meds.  Is on ativan prn.  Once off propofol, need to monitor for possible w/d  Best Practice (right click and "Reselect all SmartList Selections" daily)   Diet/type: NPO; TF  DVT prophylaxis: systemic heparin GI prophylaxis: H2B Lines: Central line Foley:  Yes, and it is still needed Code Status:  DNR Last date of multidisciplinary goals of care discussion [1/24 updated husband at bedside]  Husband updated 1/25 at bedside.  Pending 1/26  CRITICAL CARE Performed by: RKipp Brood  Total critical care time: 40 minutes  Critical care time was exclusive of separately billable procedures and treating other patients.  Critical care was necessary to treat or prevent imminent or life-threatening deterioration.  Critical care was time spent personally by me on the following activities: development of treatment plan with patient and/or surrogate as well as nursing, discussions with consultants, evaluation of patient's response to treatment, examination of patient, obtaining history from patient or surrogate, ordering and performing treatments and interventions, ordering and review of laboratory studies, ordering and review of radiographic studies, pulse oximetry, re-evaluation  of patient's condition and participation in multidisciplinary rounds.  Kipp Brood, MD [facility] ICU Physician [place]  Pager: [phone] Mobile: [phone] After hours: [phone].

## 2022-07-17 NOTE — Progress Notes (Signed)
[place] Progress Note Patient Name: Carmen Green DOB: 09/18/1950 MRN: [id number]   Date of Service  07/17/2022  HPI/Events of Note  Bedside RN requesting a rectal tube for frequent  watery stools, patient is on Heparin infusion.  eICU Interventions  Order to trial a non-invasive rectal pouch entered.        [name] [name] 07/17/2022, 2:43 AM

## 2022-07-17 NOTE — Progress Notes (Signed)
ANTICOAGULATION CONSULT NOTE  Pharmacy Consult for heparin infusion Indication:  Afib with possible ACS  Allergies  Allergen Reactions   Zoloft [Sertraline Hcl] Nausea Only        Amoxapine And Related Rash   Cephalexin Rash   Oxycodone     "Loopy"   Penicillins Rash    Patient Measurements: Height: '5\' 1"'$  (154.9 cm) Weight: 66.9 kg (147 lb 7.8 oz) IBW/kg (Calculated) : 47.8 Heparin Dosing Weight: 60kg  Vital Signs: Temp: 99 F (37.2 C) (01/27 0600) BP: 113/48 (01/27 0600) Pulse Rate: 94 (01/27 0600)  Labs: Recent Labs    07/15/22 0432 07/15/22 1611 07/15/22 2341 07/16/22 0425 07/17/22 0454  HGB 11.4*  --   --  11.0* 9.8*  HCT 33.0*  --   --  32.7* 28.7*  PLT 109*  --   --  122* 118*  HEPARINUNFRC 0.15*   < > 0.32 0.33 0.27*  CREATININE 0.68  --   --  0.68 0.73   < > = values in this interval not displayed.     Estimated Creatinine Clearance: 54.8 mL/min (by C-G formula based on SCr of 0.73 mg/dL).   Assessment: 72yo female had witnessed arrest at home, required four rounds and shock x2 before ROSC, to begin heparin for Afib and possible ACS.  Heparin level below goal range this morning. CBC trending down, but no overt bleeding or complications per night RN.  Goal of Therapy:  Heparin level 0.3-0.7 units/ml Monitor platelets by anticoagulation protocol: Yes   Plan:  Increase IV heparin to 1400 units/hr. Daily heparin level and CBC F/u plans for cath eventually pending neuro status.  Thank you for involving pharmacy in this patient's care.  Reatha Harps, PharmD PGY2 Pharmacy Resident 07/17/2022 7:22 AM

## 2022-07-18 DIAGNOSIS — I469 Cardiac arrest, cause unspecified: Secondary | ICD-10-CM | POA: Diagnosis not present

## 2022-07-18 LAB — CBC
HCT: 27.6 % — ABNORMAL LOW (ref 36.0–46.0)
Hemoglobin: 9 g/dL — ABNORMAL LOW (ref 12.0–15.0)
MCH: 32.8 pg (ref 26.0–34.0)
MCHC: 32.6 g/dL (ref 30.0–36.0)
MCV: 100.7 fL — ABNORMAL HIGH (ref 80.0–100.0)
Platelets: 111 10*3/uL — ABNORMAL LOW (ref 150–400)
RBC: 2.74 MIL/uL — ABNORMAL LOW (ref 3.87–5.11)
RDW: 12.9 % (ref 11.5–15.5)
WBC: 8.3 10*3/uL (ref 4.0–10.5)
nRBC: 0 % (ref 0.0–0.2)

## 2022-07-18 LAB — BASIC METABOLIC PANEL
Anion gap: 5 (ref 5–15)
BUN: 25 mg/dL — ABNORMAL HIGH (ref 8–23)
CO2: 24 mmol/L (ref 22–32)
Calcium: 8.9 mg/dL (ref 8.9–10.3)
Chloride: 108 mmol/L (ref 98–111)
Creatinine, Ser: 0.67 mg/dL (ref 0.44–1.00)
GFR, Estimated: >60 mL/min (ref >60)
Glucose, Bld: 150 mg/dL — ABNORMAL HIGH (ref 70–99)
Potassium: 4.2 mmol/L (ref 3.5–5.1)
Sodium: 137 mmol/L (ref 135–145)

## 2022-07-18 LAB — POCT I-STAT EG7
Acid-base deficit: 2 mmol/L (ref 0.0–2.0)
Bicarbonate: 23.2 mmol/L (ref 20.0–28.0)
Calcium, Ion: 1.41 mmol/L — ABNORMAL HIGH (ref 1.15–1.40)
HCT: 26 % — ABNORMAL LOW (ref 36.0–46.0)
Hemoglobin: 8.8 g/dL — ABNORMAL LOW (ref 12.0–15.0)
O2 Saturation: 68 %
Patient temperature: 37.3
Potassium: 4.3 mmol/L (ref 3.5–5.1)
Sodium: 139 mmol/L (ref 135–145)
TCO2: 24 mmol/L (ref 22–32)
pCO2, Ven: 39.4 mmHg — ABNORMAL LOW (ref 44–60)
pH, Ven: 7.38 (ref 7.25–7.43)
pO2, Ven: 37 mmHg (ref 32–45)

## 2022-07-18 LAB — MAGNESIUM: Magnesium: 2 mg/dL (ref 1.7–2.4)

## 2022-07-18 LAB — GLUCOSE, CAPILLARY
Glucose-Capillary: 134 mg/dL — ABNORMAL HIGH (ref 70–99)
Glucose-Capillary: 138 mg/dL — ABNORMAL HIGH (ref 70–99)
Glucose-Capillary: 140 mg/dL — ABNORMAL HIGH (ref 70–99)
Glucose-Capillary: 141 mg/dL — ABNORMAL HIGH (ref 70–99)
Glucose-Capillary: 142 mg/dL — ABNORMAL HIGH (ref 70–99)
Glucose-Capillary: 153 mg/dL — ABNORMAL HIGH (ref 70–99)

## 2022-07-18 LAB — HEPARIN LEVEL (UNFRACTIONATED): Heparin Unfractionated: 0.49 IU/mL (ref 0.30–0.70)

## 2022-07-18 MED ORDER — SODIUM CHLORIDE 0.9 % IV SOLN
2.0000 g | Freq: Three times a day (TID) | INTRAVENOUS | Status: DC
  Administered 2022-07-18 – 2022-07-19 (×4): 2 g via INTRAVENOUS
  Filled 2022-07-18 (×4): qty 12.5

## 2022-07-18 MED ORDER — SODIUM CHLORIDE 0.9 % IV SOLN: 2.0000 g | Freq: Two times a day (BID) | INTRAVENOUS | Status: DC

## 2022-07-18 NOTE — Progress Notes (Signed)
NAME:  Carmen Green, MRN:  [id number], DOB:  04/04/1951, LOS: 6 ADMISSION DATE:  07/12/2022, CONSULTATION DATE: 1/22 REFERRING MD: Dr. Darl Householder EDP, CHIEF COMPLAINT: Cardiac arrest  History of Present Illness:  72 year old female with past medical history as below, which is significant for complete heart block status post pacemaker, mitral valve prolapse, GERD, and esophagitis.  She is also recently been undergoing additional cardiac workup after presenting to the cardiology office on 1/8 with new complaints of shortness of breath and fatigue.  Echocardiogram done 1/16 demonstrated newly reduced LVEF to 25% with severe mitral regurgitation.  There were plans for her to undergo TEE and left/right heart cath, however, she unfortunately presented to [facility] emergency department on 1/22 after suffering cardiac arrest at home at approximately 1830.  Bystanders report she fell to the ground.  Sounds like she did receive bystander CPR and first responders placed AED which did not advise shock.  After EMS arrived and placed cardiac monitor she was in PEA.  Rhythm did eventually changed to V-fib at which point she was defibrillated and ROSC was achieved.  King airway was exchanged for endotracheal tube in the emergency department.  Patient was hypotensive requiring norepinephrine infusion.  EKG with nonspecific possibly ischemic changes.  Cardiology was consulted by the EDP.  PCCM has been asked to admit.  Pertinent  Medical History   has a past medical history of Allergy, Anemia, Anxiety, Arthritis, Cataract, Colon polyp, Complete heart block ([place]), Contusion of chest wall (10/10/2007), Depression, Diverticulitis, Esophagitis, ESOPHAGITIS (06/25/2008), GERD (gastroesophageal reflux disease), H/O seasonal allergies, Heart murmur, Hiatal hernia, Hyperlipidemia, Hypertension, Mitral valve prolapse, and Pacemaker.   Significant Hospital Events: Including procedures, antibiotic start and stop dates in addition to  other pertinent events   1/23 Admission following cardiac arrest; EP consulted for runs of ventricular ectopy and turned pacer up to 90 with significant improvement; Echo LVEF <20% w/ severe MR 1/24 off levo; WBC trending up and tmax 99.3, cooling per TTM protocol; cultures sent and started on broad spectrum abx 1/25 serratia in trach cx> ctx> cefepime, weaning but mental status remains slow to improve 1/27 more awake but not consistently following commands.  Started on low-dose Precedex for tube tolerance.  Interim History / Subjective:  Less interactive today.  Precedex stopped this morning.  Objective   Blood pressure 127/63, pulse 63, temperature 98.8 F (37.1 C), resp. rate 20, height '5\' 1"'$  (1.549 m), weight 69 kg, SpO2 100 %. CVP:  [2 mmHg-30 mmHg] 3 mmHg  Vent Mode: CPAP;PSV FiO2 (%):  [30 %-40 %] 30 % Set Rate:  [14 bmp] 14 bmp PEEP:  [5 cmH20] 5 cmH20 Pressure Support:  [5 cmH20] 5 cmH20 Plateau Pressure:  [12 cmH20-19 cmH20] 12 cmH20   Intake/Output Summary (Last 24 hours) at 07/18/2022 1301 Last data filed at 07/18/2022 1000 Gross per 24 hour  Intake 2081.2 ml  Output 985 ml  Net 1096.2 ml    Filed Weights   07/16/22 0457 07/17/22 0442 07/18/22 0401  Weight: 69.2 kg 66.9 kg 69 kg    Examination: General:  Older female lying in bed in NAD HEENT: MM pink/moist, pupils 4/reactive, anicteric  Neuro: Awake and eyes open but not following commands at this time.  CV: rr ir, mostly paced PULM: Chest clear to auscultation.  Tolerating SBT. GI: soft, bs+, ND, NT, foley Extremities: warm/dry, no LE edema  Skin: no rashes, skin tear left forearm  Ancillary tests personally reviewed.  Normal electrolytes, normal creatinine Mild leukocytosis  12.5 Mild anemia 9.0 Mild thrombocytopenia 111 Assessment & Plan:   OOH arrest due to VT with 20 to 30-minute downtime Acute hypoxemic respiratory failure due to pulmonary edema and Serratia PNA Hypoxic ischemic encephalopathy  following cardiac arrest New acute on chronic HFrEF with severe mitral regurgitation of unclear etiology.  Cardiogenic shock now resolved.  EF 20% CHB w/ [employer]. Jude pacemaker History of atrial flutter History of anxiety and depression History of HTN History of HLD  Plan:  -Tolerating SBT but mental status still precludes extubation. -Has made progress but full extent of neurological recovery is still unclear.  Likely needs further time to assess trajectory. -Can stop BuSpar at this time. -Pacemaker not MRI compatible. -Currently well compensated from heart failure standpoint.  Needs ischemic workup once extubated. -Avoid IV sedation much as possible -Complete course of antibiotics for Serratia pneumonia. -Started on GDHFT with carvedilol and losartan. -May benefit from MitraClip if recovers.  Will need TEE prior to this. -Continuing on amiodarone and heparin infusion for flutter.  Best Practice (right click and "Reselect all SmartList Selections" daily)   Diet/type: NPO; TF  DVT prophylaxis: systemic heparin GI prophylaxis: H2B Lines: Central line Foley:  Yes, and it is still needed Code Status:  DNR Last date of multidisciplinary goals of care discussion [1/27 updated husband at bedside]  [place] Performed by: Kipp Brood   Total critical care time: 40 minutes  Critical care time was exclusive of separately billable procedures and treating other patients.  Critical care was necessary to treat or prevent imminent or life-threatening deterioration.  Critical care was time spent personally by me on the following activities: development of treatment plan with patient and/or surrogate as well as nursing, discussions with consultants, evaluation of patient's response to treatment, examination of patient, obtaining history from patient or surrogate, ordering and performing treatments and interventions, ordering and review of laboratory studies, ordering and review of  radiographic studies, pulse oximetry, re-evaluation of patient's condition and participation in multidisciplinary rounds.  Kipp Brood, MD [facility] ICU Physician [place]  Pager: [phone] Mobile: [phone] After hours: [phone].

## 2022-07-18 NOTE — Progress Notes (Signed)
Pharmacy Antibiotic Note  Carmen Green is a 72 y.o. female admitted on 07/12/2022 with sepsis.  Pharmacy initially consulted for vancomycin and zosyn dosing.  S/p cardiac arrest. WBC improved to 8.3, LA 1.8 on last check. Currently afebrile. CrCl is 81 ml/min using ABW and 66 ml/min using AdjBW. Will continue current dosing interval and monitor renal function. Hemodynamics have improved significantly, remains intubated.  Trach aspirate now growing serratia, pharmacy asked to narrow antibiotics to cefepime monotherapy.  Plan: Cefepime 2g IV q 8 hrs. Monitor cx results, clinical pic, and vanc levels as needed  Height: '5\' 1"'$  (154.9 cm) Weight: 69 kg (152 lb 1.9 oz) IBW/kg (Calculated) : 47.8  Temp (24hrs), Avg:98.8 F (37.1 C), Min:94.6 F (34.8 C), Max:99.9 F (37.7 C)  Recent Labs  Lab 07/13/22 0610 07/13/22 0915 07/13/22 1715 07/14/22 0354 07/14/22 1330 07/14/22 2051 07/15/22 0432 07/16/22 0425 07/17/22 0454 07/18/22 0416  WBC 12.9*  --    < > 16.3*  --   --  11.2* 14.7* 12.5* 8.3  CREATININE  --  1.32*   < > 1.12*  --   --  0.68 0.68 0.73 0.67  LATICACIDVEN 2.6* 4.3*  --   --  2.3* 1.8  --   --   --   --    < > = values in this interval not displayed.     Estimated Creatinine Clearance: 55.7 mL/min (by C-G formula based on SCr of 0.67 mg/dL).    Allergies  Allergen Reactions   Zoloft [Sertraline Hcl] Nausea Only        Amoxapine And Related Rash   Cephalexin Rash   Oxycodone     "Loopy"   Penicillins Rash    Antimicrobials this admission: Vancomycin 1/24 >> 1/25 Zosyn 1/24 >> 1/25 Cefepime 1/25 >   Dose adjustments this admission: N/A  Microbiology results: MRSA PCR 1/23: neg  Bcx 1/24: ordered Ucx 1/24: ordered Trach aspirate 1/24: serratia COVID/Flu PCR 1/24: neg  Thank you for involving pharmacy in this patient's care.  Reatha Harps, PharmD PGY2 Pharmacy Resident 07/18/2022 7:56 AM

## 2022-07-18 NOTE — Progress Notes (Signed)
ANTICOAGULATION CONSULT NOTE  Pharmacy Consult for heparin infusion Indication:  Afib with possible ACS  Allergies  Allergen Reactions   Zoloft [Sertraline Hcl] Nausea Only        Amoxapine And Related Rash   Cephalexin Rash   Oxycodone     "Loopy"   Penicillins Rash    Patient Measurements: Height: '5\' 1"'$  (154.9 cm) Weight: 69 kg (152 lb 1.9 oz) IBW/kg (Calculated) : 47.8 Heparin Dosing Weight: 60kg  Vital Signs: Temp: 98.8 F (37.1 C) (01/28 0600) BP: 120/66 (01/28 0600) Pulse Rate: 91 (01/28 0600)  Labs: Recent Labs    07/16/22 0425 07/17/22 0454 07/18/22 0410 07/18/22 0416  HGB 11.0* 9.8* 8.8* 9.0*  HCT 32.7* 28.7* 26.0* 27.6*  PLT 122* 118*  --  111*  HEPARINUNFRC 0.33 0.27*  --  0.49  CREATININE 0.68 0.73  --  0.67     Estimated Creatinine Clearance: 55.7 mL/min (by C-G formula based on SCr of 0.67 mg/dL).   Assessment: 72yo female had witnessed arrest at home, required four rounds and shock x2 before ROSC, to begin heparin for Afib and possible ACS.  Heparin level in goal range this morning. CBC trending down, but no overt bleeding or complications per night RN.  Goal of Therapy:  Heparin level 0.3-0.7 units/ml Monitor platelets by anticoagulation protocol: Yes   Plan:  Continue IV heparin at 1400 units/hr. Daily heparin level and CBC F/u plans for cath eventually pending neuro status.  Thank you for involving pharmacy in this patient's care.  Reatha Harps, PharmD PGY2 Pharmacy Resident 07/18/2022 6:58 AM

## 2022-07-18 NOTE — Progress Notes (Signed)
Advanced Heart Failure Rounding Note  PCP-Cardiologist: None   Subjective:   - Intubated; on pressure support & precedex.  - Euvolemic on exam.   Objective:   Weight Range: 69 kg Body mass index is 28.74 kg/m.   Vital Signs:   Temp:  [94.6 F (34.8 C)-99.9 F (37.7 C)] 99 F (37.2 C) (01/28 0900) Pulse Rate:  [90-95] 90 (01/28 0900) Resp:  [15-21] 20 (01/28 0835) BP: (84-129)/(41-75) 120/65 (01/28 0900) SpO2:  [95 %-100 %] 99 % (01/28 0900) FiO2 (%):  [30 %-40 %] 30 % (01/28 0835) Weight:  [69 kg] 69 kg (01/28 0401) Last BM Date : 07/17/22  Weight change: Filed Weights   07/16/22 0457 07/17/22 0442 07/18/22 0401  Weight: 69.2 kg 66.9 kg 69 kg    Intake/Output:   Intake/Output Summary (Last 24 hours) at 07/18/2022 1034 Last data filed at 07/18/2022 0545 Gross per 24 hour  Intake 1424.17 ml  Output 735 ml  Net 689.17 ml       Physical Exam   CVP 8 General:  Intubated, withdrawals to painful stimuli  HEENT: + ETT Neck: supple. no JVD. Carotids 2+ bilat; no bruits. No lymphadenopathy or thryomegaly appreciated. Cor: PMI nondisplaced. Regular rate & rhythm. No rubs, gallops or murmurs. L upper chest PPM R upper chest PPM abandoned Lungs: intubated and clear Abdomen: soft, nontender, nondistended. No hepatosplenomegaly. No bruits or masses. Good bowel sounds. Extremities: no cyanosis, clubbing, rash, edema Neuro:Intubated. Withdrawing to pain, moving extremities.   Telemetry   A- Paced 90s  EKG    N/A  Labs    CBC Recent Labs    07/17/22 0454 07/18/22 0410 07/18/22 0416  WBC 12.5*  --  8.3  HGB 9.8* 8.8* 9.0*  HCT 28.7* 26.0* 27.6*  MCV 98.6  --  100.7*  PLT 118*  --  111*    Basic Metabolic Panel Recent Labs    07/15/22 1611 07/15/22 1611 07/16/22 0425 07/17/22 0454 07/18/22 0410 07/18/22 0416  NA  --    < > 132* 136 139 137  K  --    < > 5.0 3.9 4.3 4.2  CL  --    < > 103 108  --  108  CO2  --    < > 22 22  --  24  GLUCOSE   --    < > 128* 169*  --  150*  BUN  --    < > 26* 24*  --  25*  CREATININE  --    < > 0.68 0.73  --  0.67  CALCIUM  --    < > 8.9 8.8*  --  8.9  MG 2.0  --  2.0 2.0  --  2.0  PHOS 2.5  --  2.8  --   --   --    < > = values in this interval not displayed.    Liver Function Tests Recent Labs    07/16/22 0425 07/17/22 0454  AST 98* 61*  ALT 174* 111*  ALKPHOS 55 49  BILITOT 0.9 0.6  PROT 6.0* 5.3*  ALBUMIN 3.1* 2.6*     BNP: BNP (last 3 results) Recent Labs    07/12/22 0057  BNP 547.6*     ProBNP (last 3 results) Recent Labs    06/28/22 1040  PROBNP 3,230*    Fasting Lipid Panel Recent Labs    07/16/22 0425  TRIG 86     Imaging    No results  found.   Medications:     Scheduled Medications:  aspirin  81 mg Per Tube Daily   busPIRone  30 mg Oral Q8H   Or   busPIRone  30 mg Per Tube Q8H   carvedilol  3.125 mg Oral BID WC   Chlorhexidine Gluconate Cloth  6 each Topical Daily   docusate  100 mg Per Tube BID   famotidine  20 mg Per Tube BID   feeding supplement (PROSource TF20)  60 mL Per Tube Daily   losartan  12.5 mg Oral Daily   mouth rinse  15 mL Mouth Rinse Q2H   polyethylene glycol  17 g Per Tube Daily    Infusions:  sodium chloride Stopped (07/13/22 1748)   sodium chloride 10 mL/hr at 07/16/22 0304   amiodarone 30 mg/hr (07/18/22 0325)   ceFEPime (MAXIPIME) IV 2 g (07/18/22 0829)   dexmedetomidine (PRECEDEX) IV infusion 0.2 mcg/kg/hr (07/18/22 0128)   feeding supplement (VITAL 1.5 CAL) 45 mL/hr at 07/18/22 0112   heparin 1,400 Units/hr (07/18/22 0855)    PRN Medications: sodium chloride, acetaminophen **OR** acetaminophen (TYLENOL) oral liquid 160 mg/5 mL **OR** acetaminophen, fentaNYL (SUBLIMAZE) injection, ondansetron (ZOFRAN) IV, mouth rinse    Patient Profile   72 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  Assessment/Plan    1. PEA Arrest--> V Fib--> shock  - Witnessed arrest. CPR/EPI/Shock x2 with ROSC Unclear etiology/ Suspect ischemic event.  TSH ok. HIV NR  Echo 06/1622 recently noted to be  25-30% with WMA + mildly reduced RV, and dilated LA.  - Lactic Acid 2.6>4.3 ->1.8 - HS Trop 463-->753 .Will need cath once stabilized  - LFTs trending down. Elevated suspect in the setting of shock.  - CTH w/o acute infarct - On 1/27, patient appeared to be looking at her husband and responding to painful stimuli. This AM on precedex w/o any significant improvement in neurologic response over the past 24H. Will obtain EEG. CCM following.  - Otherwise, losartan 12.'5mg'$  as able.  - Plan to start spironolactone soon.  - LHC if she has meaningful neurologic recovery.   2. Acute Hypoxic Respiratory Failure - Intubated per CCM  - CCM following; extubation when able.    3. Acute/Chronic HFrEF Etiology unclear. Echo as noted about EF 25-30% with WMA.  - Will need eventual cath. Severe MR on ECHO possible valvular disease. Also significant RV pacing.  - POCUS- IVC not dilated. CVP 6-7  - start losartan 12.'5mg'$  as BP allows; ordered for today.  - Avoid SGLT2i with cystitis 2 occasions last year.  - coreg 3.'125mg'$  BID   4.  A flutter  - Continue amio drip.  - Continue heparin drip.  - Pacing at 90 BPM   5 . AKI -Resolved.    6.  PVCs -Frequent PVCs -Continue amio drip.    7.  MR, severe on Echo  - Once stabilized will need TEE   8.  H/O CHB--> St Jude PPM  Gen changed 2004, 2012.  Abandoned R side device. L sided device implanted 2021.  EP appreciated. Pacer rate increased to 90.    9. Aspiration PNA  - Tracheal aspirate + serratia. BCx NGTD  - continue cefepime   10.  DNR    Length of Stay: [place], DO  07/18/2022, 10:34 AM  Advanced Heart Failure Team Pager [phone] (M-F; 7a - 5p)  Please  contact [facility] for night-coverage after hours (5p -7a ) and weekends on  [email or web address]  CRITICAL CARE Performed by: Hebert Soho   Total critical care time: 30 minutes  Critical care time was exclusive of separately billable procedures and treating other patients.  Critical care was necessary to treat or prevent imminent or life-threatening deterioration.  Critical care was time spent personally by me on the following activities: development of treatment plan with patient and/or surrogate as well as nursing, discussions with consultants, evaluation of patient's response to treatment, examination of patient, obtaining history from patient or surrogate, ordering and performing treatments and interventions, ordering and review of laboratory studies, ordering and review of radiographic studies, pulse oximetry and re-evaluation of patient's condition.

## 2022-07-19 ENCOUNTER — Inpatient Hospital Stay (HOSPITAL_COMMUNITY): Payer: Medicare Other

## 2022-07-19 ENCOUNTER — Other Ambulatory Visit: Payer: Self-pay | Admitting: Family Medicine

## 2022-07-19 DIAGNOSIS — I469 Cardiac arrest, cause unspecified: Secondary | ICD-10-CM | POA: Diagnosis not present

## 2022-07-19 DIAGNOSIS — I10 Essential (primary) hypertension: Secondary | ICD-10-CM

## 2022-07-19 DIAGNOSIS — R4182 Altered mental status, unspecified: Secondary | ICD-10-CM | POA: Diagnosis not present

## 2022-07-19 LAB — CBC WITH DIFFERENTIAL/PLATELET
Abs Immature Granulocytes: 0.1 10*3/uL — ABNORMAL HIGH (ref 0.00–0.07)
Basophils Absolute: 0 10*3/uL (ref 0.0–0.1)
Basophils Relative: 0 %
Eosinophils Absolute: 0.1 10*3/uL (ref 0.0–0.5)
Eosinophils Relative: 1 %
HCT: 22.4 % — ABNORMAL LOW (ref 36.0–46.0)
Hemoglobin: 7.3 g/dL — ABNORMAL LOW (ref 12.0–15.0)
Immature Granulocytes: 1 %
Lymphocytes Relative: 9 %
Lymphs Abs: 0.8 10*3/uL (ref 0.7–4.0)
MCH: 33.3 pg (ref 26.0–34.0)
MCHC: 32.6 g/dL (ref 30.0–36.0)
MCV: 102.3 fL — ABNORMAL HIGH (ref 80.0–100.0)
Monocytes Absolute: 1 10*3/uL (ref 0.1–1.0)
Monocytes Relative: 11 %
Neutro Abs: 7 10*3/uL (ref 1.7–7.7)
Neutrophils Relative %: 78 %
Platelets: 142 10*3/uL — ABNORMAL LOW (ref 150–400)
RBC: 2.19 MIL/uL — ABNORMAL LOW (ref 3.87–5.11)
RDW: 13.1 % (ref 11.5–15.5)
WBC: 9 10*3/uL (ref 4.0–10.5)
nRBC: 0 % (ref 0.0–0.2)

## 2022-07-19 LAB — CBC
HCT: 20 % — ABNORMAL LOW (ref 36.0–46.0)
HCT: 23.8 % — ABNORMAL LOW (ref 36.0–46.0)
HCT: 22.1 % — ABNORMAL LOW (ref 36.0–46.0)
Hemoglobin: 6.6 g/dL — CL (ref 12.0–15.0)
Hemoglobin: 7.8 g/dL — ABNORMAL LOW (ref 12.0–15.0)
Hemoglobin: 7.5 g/dL — ABNORMAL LOW (ref 12.0–15.0)
MCH: 33.7 pg (ref 26.0–34.0)
MCH: 33.1 pg (ref 26.0–34.0)
MCH: 33.8 pg (ref 26.0–34.0)
MCHC: 33 g/dL (ref 30.0–36.0)
MCHC: 32.8 g/dL (ref 30.0–36.0)
MCHC: 33.9 g/dL (ref 30.0–36.0)
MCV: 102 fL — ABNORMAL HIGH (ref 80.0–100.0)
MCV: 100.8 fL — ABNORMAL HIGH (ref 80.0–100.0)
MCV: 99.5 fL (ref 80.0–100.0)
Platelets: 128 10*3/uL — ABNORMAL LOW (ref 150–400)
Platelets: 136 10*3/uL — ABNORMAL LOW (ref 150–400)
Platelets: 154 10*3/uL (ref 150–400)
RBC: 1.96 MIL/uL — ABNORMAL LOW (ref 3.87–5.11)
RBC: 2.36 MIL/uL — ABNORMAL LOW (ref 3.87–5.11)
RBC: 2.22 MIL/uL — ABNORMAL LOW (ref 3.87–5.11)
RDW: 13.1 % (ref 11.5–15.5)
RDW: 13.1 % (ref 11.5–15.5)
RDW: 13.3 % (ref 11.5–15.5)
WBC: 7.3 10*3/uL (ref 4.0–10.5)
WBC: 8.1 10*3/uL (ref 4.0–10.5)
WBC: 9.5 10*3/uL (ref 4.0–10.5)
nRBC: 0 % (ref 0.0–0.2)
nRBC: 0 % (ref 0.0–0.2)
nRBC: 0 % (ref 0.0–0.2)

## 2022-07-19 LAB — COOXEMETRY PANEL
Carboxyhemoglobin: 2.7 % — ABNORMAL HIGH (ref 0.5–1.5)
Methemoglobin: <0.7 % (ref 0.0–1.5)
O2 Saturation: 87.5 %
Total hemoglobin: 7.2 g/dL — ABNORMAL LOW (ref 12.0–16.0)

## 2022-07-19 LAB — BASIC METABOLIC PANEL
Anion gap: 5 (ref 5–15)
BUN: 28 mg/dL — ABNORMAL HIGH (ref 8–23)
CO2: 24 mmol/L (ref 22–32)
Calcium: 8.7 mg/dL — ABNORMAL LOW (ref 8.9–10.3)
Chloride: 109 mmol/L (ref 98–111)
Creatinine, Ser: 0.65 mg/dL (ref 0.44–1.00)
GFR, Estimated: >60 mL/min (ref >60)
Glucose, Bld: 146 mg/dL — ABNORMAL HIGH (ref 70–99)
Potassium: 3.9 mmol/L (ref 3.5–5.1)
Sodium: 138 mmol/L (ref 135–145)

## 2022-07-19 LAB — GLUCOSE, CAPILLARY
Glucose-Capillary: 142 mg/dL — ABNORMAL HIGH (ref 70–99)
Glucose-Capillary: 142 mg/dL — ABNORMAL HIGH (ref 70–99)
Glucose-Capillary: 132 mg/dL — ABNORMAL HIGH (ref 70–99)
Glucose-Capillary: 133 mg/dL — ABNORMAL HIGH (ref 70–99)
Glucose-Capillary: 97 mg/dL (ref 70–99)
Glucose-Capillary: 140 mg/dL — ABNORMAL HIGH (ref 70–99)

## 2022-07-19 LAB — CULTURE, BLOOD (ROUTINE X 2)
Culture: NO GROWTH
Culture: NO GROWTH
Special Requests: ADEQUATE
Special Requests: ADEQUATE

## 2022-07-19 LAB — HEMOGLOBIN AND HEMATOCRIT, BLOOD
HCT: 25.6 % — ABNORMAL LOW (ref 36.0–46.0)
Hemoglobin: 8.9 g/dL — ABNORMAL LOW (ref 12.0–15.0)

## 2022-07-19 LAB — HEPARIN LEVEL (UNFRACTIONATED): Heparin Unfractionated: 0.55 IU/mL (ref 0.30–0.70)

## 2022-07-19 LAB — PREPARE RBC (CROSSMATCH)

## 2022-07-19 MED ORDER — FUROSEMIDE 10 MG/ML IJ SOLN
40.0000 mg | Freq: Once | INTRAMUSCULAR | Status: AC
  Administered 2022-07-19: 40 mg via INTRAVENOUS
  Filled 2022-07-19: qty 4

## 2022-07-19 MED ORDER — SODIUM CHLORIDE 0.9% IV SOLUTION: Freq: Once | INTRAVENOUS | Status: DC

## 2022-07-19 MED ORDER — SODIUM CHLORIDE 0.9 % IV SOLN
2.0000 g | Freq: Three times a day (TID) | INTRAVENOUS | Status: AC
  Administered 2022-07-19 – 2022-07-21 (×7): 2 g via INTRAVENOUS
  Filled 2022-07-19 (×7): qty 2

## 2022-07-19 MED ORDER — PANTOPRAZOLE SODIUM 40 MG IV SOLR
40.0000 mg | Freq: Two times a day (BID) | INTRAVENOUS | Status: DC
  Administered 2022-07-19 – 2022-07-23 (×9): 40 mg via INTRAVENOUS
  Filled 2022-07-19 (×9): qty 10

## 2022-07-19 MED ORDER — POTASSIUM CHLORIDE 20 MEQ PO PACK
20.0000 meq | PACK | Freq: Once | ORAL | Status: AC
  Administered 2022-07-19: 20 meq
  Filled 2022-07-19: qty 1

## 2022-07-19 NOTE — Progress Notes (Addendum)
ANTICOAGULATION CONSULT NOTE  Pharmacy Consult for heparin infusion Indication:  Afib with possible ACS  Allergies  Allergen Reactions   Zoloft [Sertraline Hcl] Nausea Only        Amoxapine And Related Rash   Cephalexin Rash   Oxycodone     "Loopy"   Penicillins Rash    Patient Measurements: Height: '5\' 1"'$  (154.9 cm) Weight: 69.2 kg (152 lb 8.9 oz) IBW/kg (Calculated) : 47.8 Heparin Dosing Weight: 60kg  Vital Signs: Temp: 99.1 F (37.3 C) (01/29 0700) Temp Source: Esophageal (01/28 2100) BP: 98/63 (01/29 0700) Pulse Rate: 87 (01/29 0700)  Labs: Recent Labs    07/17/22 0454 07/18/22 0410 07/18/22 0416 07/19/22 0438 07/19/22 0600  HGB 9.8*   < > 9.0* 6.6* 7.3*  HCT 28.7*   < > 27.6* 20.0* 22.4*  PLT 118*  --  111* 128* 142*  HEPARINUNFRC 0.27*  --  0.49 0.55  --   CREATININE 0.73  --  0.67 0.65  --    < > = values in this interval not displayed.     Estimated Creatinine Clearance: 55.8 mL/min (by C-G formula based on SCr of 0.65 mg/dL).   Assessment: 72yo female had witnessed arrest at home, required four rounds and shock x2 before ROSC, to begin heparin for Afib and possible ACS.  Heparin level is therapeutic at 0.55, on heparin infusion at 1400 units/hr. Hgb 9 down to 6.6, repeat 7.3; plt up to 142. No s/sx of bleeding or infusion issues per nursing.   Goal of Therapy:  Heparin level 0.3-0.7 units/ml Monitor platelets by anticoagulation protocol: Yes   Plan:  Continue IV heparin at 1400 units/hr. Daily heparin level and CBC F/u plans for cath eventually pending neuro status.  Thank you for involving pharmacy in this patient's care.  Antonietta Jewel, PharmD, [place] Clinical Pharmacist  Phone: [phone] 07/19/2022 7:48 AM  Please check AMION for all [place] phone numbers After 10:00 PM, call [place] [phone]  ADDENDUM With Hgb dropping and plan for transfusion - holding heparin infusion for now. Will reassess tomorrow.   Antonietta Jewel,  PharmD, [place] Clinical Pharmacist

## 2022-07-19 NOTE — Consult Note (Signed)
Neurology Consultation  Reason for Consult: Twitching movements Referring Physician: Dr. Tamala Julian  CC: None  History is obtained from: Heart  HPI: Carmen Green is a 72 y.o. female with history of complete heart block status post pacemaker, mitral valve prolapse, GERD, CHF with EF of 25% and esophagitis who initially presented after cardiac arrest with unclear downtime on 1/22.  Patient received bystander CPR and 1 shock with ROSC achieved then.  She has been intubated in the ICU since then and has developed twitching movements of bilateral upper extremities, jaw and shoulders.  She is on cefepime for Serratia in tracheal aspirate.   ROS: Unable to obtain due to altered mental status.   Past Medical History:  Diagnosis Date   Allergy    Anemia    Anxiety    Arthritis    Cataract    bil cataracts removed   Colon polyp    Complete heart block ([place])    Contusion of chest wall 10/10/2007   Centricity Description: CHEST WALL CONTUSION Qualifier: Diagnosis of  By: Aline Brochure MD, Dorothyann Peng   Centricity Description: CONTUSION, RIGHT RIB Qualifier: Diagnosis of  By: Aline Brochure MD, Stanley     Depression    Diverticulitis    Esophagitis    ESOPHAGITIS 06/25/2008   Qualifier: History of  By: Harlon Ditty CMA (AAMA), Dottie     GERD (gastroesophageal reflux disease)    H/O seasonal allergies    Heart murmur    Hiatal hernia    Hyperlipidemia    s/p PPM implant by Dr Olevia Perches 1992 with generator change 2004 ([facility]), she is device dependant   Hypertension    Mitral valve prolapse    Pacemaker      Family History  Problem Relation Age of Onset   Diabetes Mother    Stroke Mother    Diabetes Father    Lung cancer Father    Heart disease Maternal Aunt    Breast cancer Maternal Aunt    Heart disease Maternal Grandmother    Lung cancer Maternal Grandfather    Colon cancer Neg Hx    Esophageal cancer Neg Hx    Pancreatic cancer Neg Hx    Rectal cancer Neg Hx    Stomach cancer Neg Hx       Social History:   reports that she has never smoked. She has never used smokeless tobacco. She reports that she does not drink alcohol and does not use drugs.  Medications  Current Facility-Administered Medications:    0.9 %  sodium chloride infusion (Manually program via Guardrails IV Fluids), , Intravenous, Once, Larey Dresser, MD   0.9 %  sodium chloride infusion, , Intravenous, PRN, Hunsucker, Bonna Gains, MD, Last Rate: 10 mL/hr at 07/19/22 1000, Infusion Verify at 07/19/22 1000   acetaminophen (TYLENOL) tablet 650 mg, 650 mg, Oral, Q4H PRN, 650 mg at 07/19/22 0854 **OR** acetaminophen (TYLENOL) 160 MG/5ML solution 650 mg, 650 mg, Per Tube, Q4H PRN, 650 mg at 07/18/22 2204 **OR** acetaminophen (TYLENOL) suppository 650 mg, 650 mg, Rectal, Q4H PRN, Corey Harold, NP   [COMPLETED] amiodarone (NEXTERONE) 1.8 mg/mL load via infusion 150 mg, 150 mg, Intravenous, Once, 150 mg at 07/13/22 1237 **FOLLOWED BY** [EXPIRED] amiodarone (NEXTERONE PREMIX) 360-4.14 MG/200ML-% (1.8 mg/mL) IV infusion, 60 mg/hr, Intravenous, Continuous, Paused at 07/13/22 1237 **FOLLOWED BY** amiodarone (NEXTERONE PREMIX) 360-4.14 MG/200ML-% (1.8 mg/mL) IV infusion, 30 mg/hr, Intravenous, Continuous, Sabharwal, Aditya, DO, Last Rate: 16.67 mL/hr at 07/19/22 1000, 30 mg/hr at 07/19/22 1000   aspirin chewable  tablet 81 mg, 81 mg, Per Tube, Daily, Hunsucker, Bonna Gains, MD, 81 mg at 07/19/22 0854   carvedilol (COREG) tablet 3.125 mg, 3.125 mg, Oral, BID WC, Agarwala, Ravi, MD, 3.125 mg at 07/19/22 0742   ceFEPIme (MAXIPIME) 2 g in sodium chloride 0.9 % 100 mL IVPB, 2 g, Intravenous, Q8H, Ursula Beath, RPH, Stopped at 07/19/22 8889   Chlorhexidine Gluconate Cloth 2 % PADS 6 each, 6 each, Topical, Daily, Hunsucker, Bonna Gains, MD, 6 each at 07/19/22 0855   dexmedetomidine (PRECEDEX) 400 MCG/100ML (4 mcg/mL) infusion, 0-1.2 mcg/kg/hr, Intravenous, Titrated, Agarwala, Einar Grad, MD, Stopped at 07/19/22 0819   feeding supplement  (PROSource TF20) liquid 60 mL, 60 mL, Per Tube, Daily, Mick Sell, PA-C, 60 mL at 07/19/22 0855   feeding supplement (VITAL 1.5 CAL) liquid 1,000 mL, 1,000 mL, Per Tube, Continuous, Julian Hy, DO, Last Rate: 45 mL/hr at 07/19/22 1000, Infusion Verify at 07/19/22 1000   fentaNYL (SUBLIMAZE) injection 25-100 mcg, 25-100 mcg, Intravenous, Q30 min PRN, Corey Harold, NP, 25 mcg at 07/14/22 0911   losartan (COZAAR) tablet 12.5 mg, 12.5 mg, Oral, Daily, Sabharwal, Aditya, DO, 12.5 mg at 07/19/22 0854   ondansetron (ZOFRAN) injection 4 mg, 4 mg, Intravenous, Q6H PRN, Corey Harold, NP   Oral care mouth rinse, 15 mL, Mouth Rinse, Q2H, Hunsucker, Bonna Gains, MD, 15 mL at 07/19/22 0743   Oral care mouth rinse, 15 mL, Mouth Rinse, PRN, Hunsucker, Bonna Gains, MD   pantoprazole (PROTONIX) injection 40 mg, 40 mg, Intravenous, Q12H, Candee Furbish, MD   Exam: Current vital signs: BP (!) 133/57   Pulse 91   Temp 98.4 F (36.9 C) (Esophageal)   Resp (!) 22   Ht '5\' 1"'$  (1.549 m)   Wt 69.2 kg   SpO2 100%   BMI 28.83 kg/m  Vital signs in last 24 hours: Temp:  [97 F (36.1 C)-100 F (37.8 C)] 98.4 F (36.9 C) (01/29 1030) Pulse Rate:  [50-102] 91 (01/29 1030) Resp:  [16-31] 22 (01/29 1030) BP: (91-158)/(46-107) 133/57 (01/29 1030) SpO2:  [94 %-100 %] 100 % (01/29 1030) FiO2 (%):  [30 %] 30 % (01/29 0813) Weight:  [69.2 kg] 69.2 kg (01/29 0500)  GENERAL: Intubated elderly patient in no acute distress Head: Normocephalic and atraumatic, without obvious abnormality EENT: Normal conjunctivae, mucous membranes, ETT in place LUNGS: Respirations synchronous with ventilator  CV: Paced rhythm on telemetry Extremities: warm, well perfused, without obvious deformity  NEURO:  Mental Status: Patient is in bed with eyes open but cannot follow commands and does not track examiner Cranial Nerves:  PERRL, corneal reflex intact, cough and gag intact, positive oculocephalic reflex, does not track   Motor: Moves bilateral upper extremities with antigravity strength, moves toes and feet on bilateral lower extremities spontaneously but not purposefully.  Fine tremor seen in bilateral upper extremities Sensation: Moves all extremities to noxious stimuli    Labs I have reviewed labs in epic and the results pertinent to this consultation are:  CBC    Component Value Date/Time   WBC 9.0 07/19/2022 0600   RBC 2.19 (L) 07/19/2022 0600   HGB 7.3 (L) 07/19/2022 0600   HGB 13.8 06/28/2022 1040   HCT 22.4 (L) 07/19/2022 0600   HCT 40.8 06/28/2022 1040   PLT 142 (L) 07/19/2022 0600   PLT 239 06/28/2022 1040   MCV 102.3 (H) 07/19/2022 0600   MCV 97 06/28/2022 1040   MCH 33.3 07/19/2022 0600   MCHC 32.6 07/19/2022  0600   RDW 13.1 07/19/2022 0600   RDW 11.8 06/28/2022 1040   LYMPHSABS 0.8 07/19/2022 0600   LYMPHSABS 1.1 01/17/2020 1346   MONOABS 1.0 07/19/2022 0600   EOSABS 0.1 07/19/2022 0600   EOSABS 0.0 01/17/2020 1346   BASOSABS 0.0 07/19/2022 0600   BASOSABS 0.1 01/17/2020 1346    CMP     Component Value Date/Time   NA 138 07/19/2022 0438   NA 138 06/28/2022 1040   K 3.9 07/19/2022 0438   CL 109 07/19/2022 0438   CO2 24 07/19/2022 0438   GLUCOSE 146 (H) 07/19/2022 0438   BUN 28 (H) 07/19/2022 0438   BUN 9 06/28/2022 1040   CREATININE 0.65 07/19/2022 0438   CREATININE 0.79 01/21/2011 1642   CALCIUM 8.7 (L) 07/19/2022 0438   PROT 5.3 (L) 07/17/2022 0454   PROT 7.1 09/30/2021 0849   ALBUMIN 2.6 (L) 07/17/2022 0454   ALBUMIN 4.7 09/30/2021 0849   AST 61 (H) 07/17/2022 0454   ALT 111 (H) 07/17/2022 0454   ALKPHOS 49 07/17/2022 0454   BILITOT 0.6 07/17/2022 0454   BILITOT 0.4 09/30/2021 0849   GFRNONAA >60 07/19/2022 0438   GFRAA 50 (L) 01/17/2020 1346    Lipid Panel     Component Value Date/Time   CHOL 143 09/30/2021 0849   TRIG 86 07/16/2022 0425   HDL 52 09/30/2021 0849   CHOLHDL 2.8 09/30/2021 0849   CHOLHDL 3 10/03/2013 1623   VLDL 18.2 10/03/2013 1623    LDLCALC 79 09/30/2021 0849     Imaging I have reviewed the images obtained:  CT-scan of the brain 1/23: No acute abnormality  CT head 1/26: No acute abnormality  MRI examination of the brain: Unable to perform due to pacemaker  YJE:HUDJ study showed generalized periodic discharges with triphasic morphology likely due to toxic-metabolic causes like cefepime toxicity.  Additionally there is moderate degree of encephalopathy.  No seizures or definite epileptiform discharges were seen throughout the recording   Assessment: 72 year old patient with above past medical history status postcardiac arrest with bystander CPR and ROSC achieved after unknown downtime now has developed twitching movements of the bilateral upper extremities, jaw and shoulders.  Right stem reflexes are preserved, however patient is unable to follow commands and displays only nonpurposeful movement of extremities.  CT head has been performed twice and shows no acute abnormalities, MRI unable to be performed due to pacemaker.  EEG shows GPD's with triphasic morphology, likely due to cefepime toxicity.  Cefepime toxicity is the likely source of patient's twitching movements, would switch to alternate antibiotic and reassess in 72 hours.  Impression: Likely cefepime toxicity in patient status postcardiac arrest  Recommendations: -Discontinue cefepime and switch to alternate antibiotic -Wait 72 hours for cefepime to clear and then reassess twitching movements  Pt seen by NP/Neuro and later by MD. Note/plan to be edited by MD as needed.  [place] , MSN, AGACNP-BC [facility] See Amion for schedule and pager information 07/19/2022 10:48 AM   NEUROHOSPITALIST ADDENDUM Performed a face to face diagnostic evaluation.   I have reviewed the contents of history and physical exam as documented by PA/ARNP/Resident and agree with above documentation.  I have discussed and formulated the above plan as  documented. Edits to the note have been made as needed.  Impression/Key exam findings/Plan: 75F with witnessed PEA Arrest followed by Vfibb arrest s/p ROSC after 30 mins of CPR. Has been here for 6 days now. Noted to have arrhythmic movements  in all extremities but most notably in BL upper extremities but also some in BL lower extremities. She opens eyes on exam and moves all extremities spontaneously but not following any commands and no attempts to communicate, no eye contact. Hard to characterize these movements clinically but slow movements in all extremities with some increased tone and the movements are arhythmic but also some tremoring/shivering. rEEG with GPD/triphasics but no seizures, CTH on 1/23 with no acute abnormalities and repeat CTH on 1/26 stable. Unable to get MRI 2/2 incompatible pace maker.  She is on Cefepime for over 6 days now and I suspect that is probably contributing to these movements and encephalopathy. She was started on Cefepime 2/2 serratia growing in tracheal aspirate. Recommend alternative to Cefepime if possible. Improvement should take about 72 hours after stopping Cefepime.  Hard to prognosticate at this time given concern for Cefepime neurotoxicity which is reversible unlike anoxic injury. We will continue to follow along and monitor for improvement in neuro exam as cefepime is held.  She is mostly off sedation and recommend keeping off sedation if able.  Donnetta Simpers, MD [facility] [phone]   If 7pm to 7am, please call on call as listed on AMION.

## 2022-07-19 NOTE — Progress Notes (Signed)
NAME:  Carmen Green, MRN:  [id number], DOB:  November 22, 1950, LOS: 7 ADMISSION DATE:  07/12/2022, CONSULTATION DATE: 1/22 REFERRING MD: Dr. Darl Householder EDP, CHIEF COMPLAINT: Cardiac arrest  History of Present Illness:  72 year old female with past medical history as below, which is significant for complete heart block status post pacemaker, mitral valve prolapse, GERD, and esophagitis.  She is also recently been undergoing additional cardiac workup after presenting to the cardiology office on 1/8 with new complaints of shortness of breath and fatigue.  Echocardiogram done 1/16 demonstrated newly reduced LVEF to 25% with severe mitral regurgitation.  There were plans for her to undergo TEE and left/right heart cath, however, she unfortunately presented to [facility] emergency department on 1/22 after suffering cardiac arrest at home at approximately 1830.  Bystanders report she fell to the ground.  Sounds like she did receive bystander CPR and first responders placed AED which did not advise shock.  After EMS arrived and placed cardiac monitor she was in PEA.  Rhythm did eventually changed to V-fib at which point she was defibrillated and ROSC was achieved.  King airway was exchanged for endotracheal tube in the emergency department.  Patient was hypotensive requiring norepinephrine infusion.  EKG with nonspecific possibly ischemic changes.  Cardiology was consulted by the EDP.  PCCM has been asked to admit.  Pertinent  Medical History   has a past medical history of Allergy, Anemia, Anxiety, Arthritis, Cataract, Colon polyp, Complete heart block ([place]), Contusion of chest wall (10/10/2007), Depression, Diverticulitis, Esophagitis, ESOPHAGITIS (06/25/2008), GERD (gastroesophageal reflux disease), H/O seasonal allergies, Heart murmur, Hiatal hernia, Hyperlipidemia, Hypertension, Mitral valve prolapse, and Pacemaker.   Significant Hospital Events: Including procedures, antibiotic start and stop dates in addition to  other pertinent events   1/23 Admission following cardiac arrest; EP consulted for runs of ventricular ectopy and turned pacer up to 90 with significant improvement; Echo LVEF <20% w/ severe MR 1/24 off levo; WBC trending up and tmax 99.3, cooling per TTM protocol; cultures sent and started on broad spectrum abx 1/25 serratia in trach cx> ctx> cefepime, weaning but mental status remains slow to improve 1/27 more awake but not consistently following commands.  Started on low-dose Precedex for tube tolerance.  Interim History / Subjective:    Objective   Blood pressure 98/63, pulse 87, temperature 99.1 F (37.3 C), resp. rate 17, height '5\' 1"'$  (1.549 m), weight 69.2 kg, SpO2 100 %. CVP:  [0 mmHg-42 mmHg] 9 mmHg  Vent Mode: PCV FiO2 (%):  [30 %] 30 % Set Rate:  [14 bmp] 14 bmp PEEP:  [5 cmH20] 5 cmH20 Pressure Support:  [5 cmH20] 5 cmH20 Plateau Pressure:  [15 cmH20-16 cmH20] 16 cmH20   Intake/Output Summary (Last 24 hours) at 07/19/2022 0815 Last data filed at 07/19/2022 0700 Gross per 24 hour  Intake 1862.67 ml  Output 1400 ml  Net 462.67 ml   Filed Weights   07/17/22 0442 07/18/22 0401 07/19/22 0500  Weight: 66.9 kg 69 kg 69.2 kg   Physical Examination: General: Acutely ill-appearing elderly female in NAD. Intubated  HEENT: [place]/AT, anicteric sclera, PERRL, moist mucous membranes. ETT in place OG in place Neuro: Awake and will open eyes. Will move BUE and BLE, not sure if purposeful.   CV: Paced, no m/g/r. PULM: Breathing even and unlabored on PSV. Lung fields CTAB. GI: Soft, nontender, nondistended. Normoactive bowel sounds. Extremities: trace LE edema noted. Skin: Warm/dry, skin tear to left ear, CVC in RIJ  Ancillary tests personally reviewed.  Hgb 7.3 HCT 20.2 Platelets 128 Coox 87.5 Assessment & Plan:   OOH arrest due to VT with 20 to 30-minute downtime Acute hypoxemic respiratory failure due to pulmonary edema and Serratia PNA Hypoxic ischemic encephalopathy  following cardiac arrest New acute on chronic HFrEF with severe mitral regurgitation of unclear etiology.  Cardiogenic shock now resolved.  EF 20% CHB w/ St. Jude pacemaker History of atrial flutter History of anxiety and depression History of HTN History of HLD  Plan: - Tolerates SBT but extubation is complicated by neuro status - Holding precedex to see if neuro status improves, she will open her eyes and move her toes but no sure if this is spontaneous movements or purposeful - EEG today - Consult Neuro - Unable to obtain MRI given Pacemaker incompatibility  - Appears to be compensated from a HF standpoint. Will need LHC/RHC as neuro status improves - Coox 87.5 , CVP 8 - Started on GDMT (losartan, coreg) - holding SGLT2i d/t recent hx of cystitis  - On Amio gtt - D/C heparin gtt - Receiving '40mg'$  IV Lasix 1/29 with Blood Per AHF - Completed Abx course for Serratia PNA - Will need a TEE  - Will need TEE at some point, may benefit from MitralClip depending on neuro progression   Hx of Anemia  Plan  - Trend H&H - Hgb 7.3 > 1uPRBC ordered today  - Monitor for signs of active bleeding - Transfuse for Hgb < 7.0 or hemodynamically significant bleeding  Best Practice (right click and "Reselect all SmartList Selections" daily)   Diet/type: NPO; TF  DVT prophylaxis: systemic heparin GI prophylaxis: H2B Lines: Central line Foley:  Yes, and it is still needed Code Status:  DNR Last date of multidisciplinary goals of care discussion [1/27 updated husband at bedside]   Erma Heritage, [place] deferred to Attending

## 2022-07-19 NOTE — Progress Notes (Deleted)
Advanced Heart Failure Rounding Note  PCP-Cardiologist: None   Patient Profile  72 y/o female w/ CHB s/p St. Jude dcPPM, HTN, HLD, and mitral valve prolapse, admitted 1/22 after episode of VF arrest followed by PEA arrest, immediate bystander CPR + defib>>ROSC. Recent outpatient Echo 1/16 w/ LVEF of 25%-30% with WMAs & severe MR (prior echo 2021 w/ normal EF 55-60%). Hospital course c/b acute hypoxic respiratory failure, aspiration PNA, AKI, AFL and PVCs.    Subjective:    remains intubated.    Objective:   Weight Range: 69.2 kg Body mass index is 28.83 kg/m.   Vital Signs:   Temp:  [98.6 F (37 C)-100 F (37.8 C)] 99.1 F (37.3 C) (01/29 0700) Pulse Rate:  [50-102] 87 (01/29 0700) Resp:  [16-31] 17 (01/29 0700) BP: (91-158)/(46-107) 98/63 (01/29 0700) SpO2:  [94 %-100 %] 100 % (01/29 0700) FiO2 (%):  [30 %] 30 % (01/29 0324) Weight:  [69.2 kg] 69.2 kg (01/29 0500) Last BM Date : 07/18/22  Weight change: Filed Weights   07/17/22 0442 07/18/22 0401 07/19/22 0500  Weight: 66.9 kg 69 kg 69.2 kg    Intake/Output:   Intake/Output Summary (Last 24 hours) at 07/19/2022 0743 Last data filed at 07/19/2022 0700 Gross per 24 hour  Intake 1943.35 ml  Output 1400 ml  Net 543.35 ml      Physical Exam    General:  Intubated, withdrawals to painful stimuli  HEENT: + ETT Neck: supple. no JVD. Carotids 2+ bilat; no bruits. No lymphadenopathy or thryomegaly appreciated. Cor: PMI nondisplaced. Regular rate & rhythm. No rubs, gallops or murmurs. L upper chest PPM R upper chest PPM abandoned Lungs: intubated and clear Abdomen: soft, nontender, nondistended. No hepatosplenomegaly. No bruits or masses. Good bowel sounds. Extremities: no cyanosis, clubbing, rash, edema Neuro:Intubated. Withdrawing to pain, moving extremities.   Telemetry   A- Paced 90s  EKG    N/A  Labs    CBC Recent Labs    07/19/22 0438 07/19/22 0600  WBC 7.3 9.0  NEUTROABS  --  7.0  HGB 6.6*  7.3*  HCT 20.0* 22.4*  MCV 102.0* 102.3*  PLT 128* 812*   Basic Metabolic Panel Recent Labs    07/17/22 0454 07/18/22 0410 07/18/22 0416 07/19/22 0438  NA 136   < > 137 138  K 3.9   < > 4.2 3.9  CL 108  --  108 109  CO2 22  --  24 24  GLUCOSE 169*  --  150* 146*  BUN 24*  --  25* 28*  CREATININE 0.73  --  0.67 0.65  CALCIUM 8.8*  --  8.9 8.7*  MG 2.0  --  2.0  --    < > = values in this interval not displayed.   Liver Function Tests Recent Labs    07/17/22 0454  AST 61*  ALT 111*  ALKPHOS 49  BILITOT 0.6  PROT 5.3*  ALBUMIN 2.6*    BNP: BNP (last 3 results) Recent Labs    07/12/22 0057  BNP 547.6*    ProBNP (last 3 results) Recent Labs    06/28/22 1040  PROBNP 3,230*   Fasting Lipid Panel No results for input(s): "CHOL", "HDL", "LDLCALC", "TRIG", "CHOLHDL", "LDLDIRECT" in the last 72 hours.  Imaging    No results found.   Medications:     Scheduled Medications:  aspirin  81 mg Per Tube Daily   carvedilol  3.125 mg Oral BID WC   Chlorhexidine Gluconate  Cloth  6 each Topical Daily   famotidine  20 mg Per Tube BID   feeding supplement (PROSource TF20)  60 mL Per Tube Daily   losartan  12.5 mg Oral Daily   mouth rinse  15 mL Mouth Rinse Q2H    Infusions:  sodium chloride 10 mL/hr at 07/19/22 0700   amiodarone 30 mg/hr (07/19/22 0700)   ceFEPime (MAXIPIME) IV Stopped (07/19/22 0104)   dexmedetomidine (PRECEDEX) IV infusion 0.5 mcg/kg/hr (07/19/22 0700)   feeding supplement (VITAL 1.5 CAL) 45 mL/hr at 07/19/22 0700   heparin 1,400 Units/hr (07/19/22 0700)    PRN Medications: sodium chloride, acetaminophen **OR** acetaminophen (TYLENOL) oral liquid 160 mg/5 mL **OR** acetaminophen, fentaNYL (SUBLIMAZE) injection, ondansetron (ZOFRAN) IV, mouth rinse    Patient Profile   72 y/o female w/ CHB s/p St. Jude dcPPM, HTN, HLD, and mitral valve prolapse, admitted 1/22 after episode of VF arrest followed by PEA arrest, immediate bystander CPR +  defib>>ROSC. Recent outpatient Echo 1/16 w/ LVEF of 25%-30% with WMAs & severe MR (prior echo 2021 w/ normal EF 55-60%). Hospital course c/b acute hypoxic respiratory failure, aspiration PNA, AKI, AFL and PVCs.   Assessment/Plan   1. PEA Arrest--> V Fib--> shock  - Witnessed arrest. CPR/EPI/Shock x2 with ROSC Unclear etiology/ Suspect ischemic event.  TSH ok. HIV NR  Echo 06/1622 recently noted to be  25-30% with WMA + mildly reduced RV, and dilated LA.  - Lactic Acid 2.6>4.3 ->1.8 - HS Trop 463-->753 .Will need cath once stabilized  - LFTs trending down. Elevated suspect in the setting of shock.  - CTH w/o acute infarct - minimal improvement in neurologic response over the past 24H. Obtain EEG. Needs neurology consult  - Continue losartan 12.'5mg'$   - Plan to start spironolactone soon.  - LHC if she has meaningful neurologic recovery.   2. Acute Hypoxic Respiratory Failure - Intubated per CCM  - CCM following; extubation when able.    3. Acute/Chronic HFrEF Etiology unclear. Echo as noted about EF 25-30% with WMA.  - Will need eventual cath. Severe MR on ECHO possible valvular disease. Also significant RV pacing.  - Volume status stable. Check Co-ox  - continue losartan 12.'5mg'$   - Avoid SGLT2i with cystitis 2 occasions last year.  - coreg 3.'125mg'$  BID   4.  A flutter  - Continue amio drip.  - Continue heparin drip.  - Pacing at 90 BPM   5 . AKI -Resolved.    6.  PVCs -Frequent PVCs -Continue amio drip.    7.  MR, severe on Echo  - Once stabilized will need TEE   8.  H/O CHB--> St Jude PPM  - Gen changed 2004, 2012.  Abandoned R side device. L sided device implanted 2021.  EP appreciated. Pacer rate increased to 90.    9. Aspiration PNA  - Tracheal aspirate + serratia. BCx NGTD  - continue cefepime   10.  DNR    Length of Stay: [address], [place]  07/19/2022, 7:43 AM  Advanced Heart Failure Team Pager [phone] (M-F; 7a - 5p)  Please contact [facility] for night-coverage after hours (5p -7a ) and weekends on [email or web address]

## 2022-07-19 NOTE — Progress Notes (Addendum)
Patient ID: Carmen Green, female   DOB: 27-Dec-1950, 72 y.o.   MRN: [id number]     Advanced Heart Failure Rounding Note  PCP-Cardiologist: None   Subjective:    She remains intubated, on Precedex.  She will withdraw to pain but does not follow commands or have any purposeful movement.   CVP 8 this morning.    She is a-paced on amiodarone gtt and heparin gtt.   Objective:   Weight Range: 69.2 kg Body mass index is 28.83 kg/m.   Vital Signs:   Temp:  [98.6 F (37 C)-100 F (37.8 C)] 99.1 F (37.3 C) (01/29 0700) Pulse Rate:  [50-102] 87 (01/29 0700) Resp:  [16-31] 17 (01/29 0700) BP: (91-158)/(46-107) 98/63 (01/29 0700) SpO2:  [94 %-100 %] 100 % (01/29 0700) FiO2 (%):  [30 %] 30 % (01/29 0324) Weight:  [69.2 kg] 69.2 kg (01/29 0500) Last BM Date : 07/18/22  Weight change: Filed Weights   07/17/22 0442 07/18/22 0401 07/19/22 0500  Weight: 66.9 kg 69 kg 69.2 kg    Intake/Output:   Intake/Output Summary (Last 24 hours) at 07/19/2022 0757 Last data filed at 07/19/2022 0700 Gross per 24 hour  Intake 1943.35 ml  Output 1400 ml  Net 543.35 ml      Physical Exam   CVP 8 General: Intubated Neck: No JVD, no thyromegaly or thyroid nodule.  Lungs: Clear to auscultation bilaterally with normal respiratory effort. CV: Nondisplaced PMI.  Heart regular S1/S2, no S3/S4,1/6 HSM apex.  No peripheral edema.   Abdomen: Soft, nontender, no hepatosplenomegaly, no distention.  Skin: Intact without lesions or rashes.  Neurologic: Withdraws to painful stimuli Extremities: No clubbing or cyanosis.  HEENT: Normal.   Telemetry   A-V sequential pacing 90s (personally reviewed)  EKG    N/A  Labs    CBC Recent Labs    07/19/22 0438 07/19/22 0600  WBC 7.3 9.0  NEUTROABS  --  7.0  HGB 6.6* 7.3*  HCT 20.0* 22.4*  MCV 102.0* 102.3*  PLT 128* 254*   Basic Metabolic Panel Recent Labs    07/17/22 0454 07/18/22 0410 07/18/22 0416 07/19/22 0438  NA 136   < > 137 138   K 3.9   < > 4.2 3.9  CL 108  --  108 109  CO2 22  --  24 24  GLUCOSE 169*  --  150* 146*  BUN 24*  --  25* 28*  CREATININE 0.73  --  0.67 0.65  CALCIUM 8.8*  --  8.9 8.7*  MG 2.0  --  2.0  --    < > = values in this interval not displayed.   Liver Function Tests Recent Labs    07/17/22 0454  AST 61*  ALT 111*  ALKPHOS 49  BILITOT 0.6  PROT 5.3*  ALBUMIN 2.6*    BNP: BNP (last 3 results) Recent Labs    07/12/22 0057  BNP 547.6*    ProBNP (last 3 results) Recent Labs    06/28/22 1040  PROBNP 3,230*   Fasting Lipid Panel No results for input(s): "CHOL", "HDL", "LDLCALC", "TRIG", "CHOLHDL", "LDLDIRECT" in the last 72 hours.  Imaging    No results found.   Medications:     Scheduled Medications:  aspirin  81 mg Per Tube Daily   carvedilol  3.125 mg Oral BID WC   Chlorhexidine Gluconate Cloth  6 each Topical Daily   famotidine  20 mg Per Tube BID   feeding supplement (PROSource TF20)  60 mL Per Tube Daily   furosemide  40 mg Intravenous Once   losartan  12.5 mg Oral Daily   mouth rinse  15 mL Mouth Rinse Q2H   potassium chloride  20 mEq Per Tube Once    Infusions:  sodium chloride 10 mL/hr at 07/19/22 0700   amiodarone 30 mg/hr (07/19/22 0700)   ceFEPime (MAXIPIME) IV Stopped (07/19/22 0104)   dexmedetomidine (PRECEDEX) IV infusion 0.5 mcg/kg/hr (07/19/22 0700)   feeding supplement (VITAL 1.5 CAL) 45 mL/hr at 07/19/22 0700   heparin 1,400 Units/hr (07/19/22 0700)    PRN Medications: sodium chloride, acetaminophen **OR** acetaminophen (TYLENOL) oral liquid 160 mg/5 mL **OR** acetaminophen, fentaNYL (SUBLIMAZE) injection, ondansetron (ZOFRAN) IV, mouth rinse    Patient Profile   72 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  Assessment/Plan   1. PEA Arrest--> V Fib--> shock: Witnessed arrest. CPR/EPI/Shock x2 with ROSC.  Unclear etiology/ Suspect  ischemic event.  TSH ok. HS Trop 463-->753.  - LHC if she has meaningful neurologic recovery.  - Currently on amiodarone gtt.  2. Neuro: Post-arrest, she responds to painful stimuli but nothing purposeful at this point.  She remains on Precedex.  Head CT with nothing acute.  - EEG today.  - Suspect we will need neuro evaluation . 3. Acute Hypoxic Respiratory Failure: With aspiration PNA.  She remains intubated.  - CCM following; extubation when able.  4. Acute/Chronic HFrEF: Echo this admission with EF < 20%, RV low normal, ?moderate MR. Cause uncertain.  Cannot rule out CAD.  Also with possible severe MR on a prior echo and has been chronically RV paced in setting of CHB. CVP 8 today.  - Will give Lasix 40 mg IV x 1 with blood today.  - Will need LHC/RHC if she has neurological recovery.  - Check co-ox today.  - Continue losartan 12.5 mg as BP allows.  - Continue Coreg 3.125 mg bid.  - Avoid SGLT2i for now with cystitis 2 occasions last year.  5. Atrial flutter: Paroxysmal.  She is now a-paced.  - Continue amio drip.  - Continue heparin drip.  6.  PVCs: Frequent PVCs.  - Continue amio drip.  7. MR: Severe on echo prior to admission, echo here was ?moderate.  - If she has neurologic recovery, will need TEE.  8.  H/O CHB--> St Jude PPM: Gen changed 2004, 2012.  Abandoned R side device. L sided device implanted 2021.  9. Aspiration PNA: Tracheal aspirate + serratia. BCx NGTD  - continue cefepime  10. Anemia: Hgb 6.6 => 7.5, no overt bleeding.  - Transfuse 1 unit PRBCs with Lasix.  11.  DNR   CRITICAL CARE Performed by: Loralie Champagne  Total critical care time: 35 minutes  Critical care time was exclusive of separately billable procedures and treating other patients.  Critical care was necessary to treat or prevent imminent or life-threatening deterioration.  Critical care was time spent personally by me on the following activities: development of treatment plan with patient and/or  surrogate as well as nursing, discussions with consultants, evaluation of patient's response to treatment, examination of patient, obtaining history from patient or surrogate, ordering and performing treatments and interventions, ordering and review of laboratory studies, ordering and review of radiographic studies, pulse oximetry and re-evaluation of patient's condition.  Loralie Champagne 07/19/2022 8:05 AM

## 2022-07-19 NOTE — Plan of Care (Signed)
  Problem: Education: Goal: Understanding of CV disease, CV risk reduction, and recovery process will improve Outcome: Progressing Goal: Individualized Educational Video(s) Outcome: Progressing   Problem: Activity: Goal: Ability to return to baseline activity level will improve Outcome: Progressing   Problem: Cardiovascular: Goal: Ability to achieve and maintain adequate cardiovascular perfusion will improve Outcome: Progressing Goal: Vascular access site(s) Level 0-1 will be maintained Outcome: Progressing   Problem: Health Behavior/Discharge Planning: Goal: Ability to safely manage health-related needs after discharge will improve Outcome: Progressing   Problem: Education: Goal: Ability to manage disease process will improve Outcome: Progressing   Problem: Cardiac: Goal: Ability to achieve and maintain adequate cardiopulmonary perfusion will improve Outcome: Progressing   Problem: Neurologic: Goal: Promote progressive neurologic recovery Outcome: Progressing   Problem: Skin Integrity: Goal: Risk for impaired skin integrity will be minimized. Outcome: Progressing   Problem: Education: Goal: Knowledge of General Education information will improve Description: Including pain rating scale, medication(s)/side effects and non-pharmacologic comfort measures Outcome: Progressing   Problem: Health Behavior/Discharge Planning: Goal: Ability to manage health-related needs will improve Outcome: Progressing   Problem: Clinical Measurements: Goal: Ability to maintain clinical measurements within normal limits will improve Outcome: Progressing Goal: Will remain free from infection Outcome: Progressing Goal: Diagnostic test results will improve Outcome: Progressing Goal: Respiratory complications will improve Outcome: Progressing Goal: Cardiovascular complication will be avoided Outcome: Progressing   Problem: Activity: Goal: Risk for activity intolerance will  decrease Outcome: Progressing   Problem: Nutrition: Goal: Adequate nutrition will be maintained Outcome: Progressing   Problem: Coping: Goal: Level of anxiety will decrease Outcome: Progressing   Problem: Elimination: Goal: Will not experience complications related to bowel motility Outcome: Progressing Goal: Will not experience complications related to urinary retention Outcome: Progressing   Problem: Pain Managment: Goal: General experience of comfort will improve Outcome: Progressing   Problem: Safety: Goal: Ability to remain free from injury will improve Outcome: Progressing   Problem: Skin Integrity: Goal: Risk for impaired skin integrity will decrease Outcome: Progressing

## 2022-07-19 NOTE — Procedures (Signed)
Patient Name: Carmen Green  MRN: [id number]  Epilepsy Attending: Lora Havens  Referring Physician/Provider: Kipp Brood, MD  Date: 07/19/2022 Duration: 27.50 mins  Patient history: 72yo F ith ams. EEG to evaluate for seizure  Level of alertness: Awake  AEDs during EEG study: None  Technical aspects: This EEG study was done with scalp electrodes positioned according to the 10-20 International system of electrode placement. Electrical activity was reviewed with band pass filter of 1-'70Hz'$ , sensitivity of 7 uV/mm, display speed of 90m/sec with a '60Hz'$  notched filter applied as appropriate. EEG data were recorded continuously and digitally stored.  Video monitoring was available and reviewed as appropriate.  Description: No clear posterior dominant rhythm was seen.  EEG showed continuous generalized predominantly 5 to 6 Hz theta slowing admixed with intermittent generalized 2 to 3 Hz delta slowing.  At times, generalized periodic discharges with triphasic morphology were noted at 1 to 2 Hz, more frequent with stimulation.  Hyperventilation and photic stimulation were not performed.     ABNORMALITY - Periodic discharges with triphasic morphology, generalized ( GPDs) -Continuous slow, generalized  IMPRESSION: This study showed generalized periodic discharges with triphasic morphology likely due to toxic-metabolic causes like cefepime toxicity.  Additionally there is moderate degree of encephalopathy.  No seizures or definite epileptiform discharges were seen throughout the recording.  Charday Capetillo OBarbra Sarks

## 2022-07-19 NOTE — Progress Notes (Signed)
EEG complete - results pending 

## 2022-07-19 NOTE — Progress Notes (Signed)
RT NOTE: patient placed on CPAP/PSV of 10/5 at 0810.  Currently tolerating well.  Will continue to monitor and wean as tolerated.

## 2022-07-20 ENCOUNTER — Ambulatory Visit: Payer: Medicare Other | Admitting: Family Medicine

## 2022-07-20 DIAGNOSIS — I469 Cardiac arrest, cause unspecified: Secondary | ICD-10-CM | POA: Diagnosis not present

## 2022-07-20 LAB — BASIC METABOLIC PANEL
Anion gap: 6 (ref 5–15)
BUN: 30 mg/dL — ABNORMAL HIGH (ref 8–23)
CO2: 26 mmol/L (ref 22–32)
Calcium: 8.8 mg/dL — ABNORMAL LOW (ref 8.9–10.3)
Chloride: 108 mmol/L (ref 98–111)
Creatinine, Ser: 0.73 mg/dL (ref 0.44–1.00)
GFR, Estimated: >60 mL/min (ref >60)
Glucose, Bld: 162 mg/dL — ABNORMAL HIGH (ref 70–99)
Potassium: 3.9 mmol/L (ref 3.5–5.1)
Sodium: 140 mmol/L (ref 135–145)

## 2022-07-20 LAB — CBC
HCT: 21.6 % — ABNORMAL LOW (ref 36.0–46.0)
HCT: 21.7 % — ABNORMAL LOW (ref 36.0–46.0)
HCT: 24.5 % — ABNORMAL LOW (ref 36.0–46.0)
HCT: 25.5 % — ABNORMAL LOW (ref 36.0–46.0)
Hemoglobin: 7.3 g/dL — ABNORMAL LOW (ref 12.0–15.0)
Hemoglobin: 7.4 g/dL — ABNORMAL LOW (ref 12.0–15.0)
Hemoglobin: 8.3 g/dL — ABNORMAL LOW (ref 12.0–15.0)
Hemoglobin: 8.3 g/dL — ABNORMAL LOW (ref 12.0–15.0)
MCH: 34.1 pg — ABNORMAL HIGH (ref 26.0–34.0)
MCH: 34.3 pg — ABNORMAL HIGH (ref 26.0–34.0)
MCH: 33.6 pg (ref 26.0–34.0)
MCH: 33.1 pg (ref 26.0–34.0)
MCHC: 33.8 g/dL (ref 30.0–36.0)
MCHC: 34.1 g/dL (ref 30.0–36.0)
MCHC: 33.9 g/dL (ref 30.0–36.0)
MCHC: 32.5 g/dL (ref 30.0–36.0)
MCV: 100.9 fL — ABNORMAL HIGH (ref 80.0–100.0)
MCV: 100.5 fL — ABNORMAL HIGH (ref 80.0–100.0)
MCV: 99.2 fL (ref 80.0–100.0)
MCV: 101.6 fL — ABNORMAL HIGH (ref 80.0–100.0)
Platelets: 142 10*3/uL — ABNORMAL LOW (ref 150–400)
Platelets: 163 10*3/uL (ref 150–400)
Platelets: 171 10*3/uL (ref 150–400)
Platelets: 169 10*3/uL (ref 150–400)
RBC: 2.14 MIL/uL — ABNORMAL LOW (ref 3.87–5.11)
RBC: 2.16 MIL/uL — ABNORMAL LOW (ref 3.87–5.11)
RBC: 2.47 MIL/uL — ABNORMAL LOW (ref 3.87–5.11)
RBC: 2.51 MIL/uL — ABNORMAL LOW (ref 3.87–5.11)
RDW: 13.3 % (ref 11.5–15.5)
RDW: 13.4 % (ref 11.5–15.5)
RDW: 13.7 % (ref 11.5–15.5)
RDW: 13.7 % (ref 11.5–15.5)
WBC: 9.7 10*3/uL (ref 4.0–10.5)
WBC: 9.7 10*3/uL (ref 4.0–10.5)
WBC: 10 10*3/uL (ref 4.0–10.5)
WBC: 10.5 10*3/uL (ref 4.0–10.5)
nRBC: 0 % (ref 0.0–0.2)
nRBC: 0 % (ref 0.0–0.2)
nRBC: 0.2 % (ref 0.0–0.2)
nRBC: 0.2 % (ref 0.0–0.2)

## 2022-07-20 LAB — COOXEMETRY PANEL
Carboxyhemoglobin: 0.6 % (ref 0.5–1.5)
Methemoglobin: <0.7 % (ref 0.0–1.5)
O2 Saturation: 78.3 %
Total hemoglobin: 7.6 g/dL — ABNORMAL LOW (ref 12.0–16.0)

## 2022-07-20 LAB — GLUCOSE, CAPILLARY
Glucose-Capillary: 148 mg/dL — ABNORMAL HIGH (ref 70–99)
Glucose-Capillary: 147 mg/dL — ABNORMAL HIGH (ref 70–99)
Glucose-Capillary: 137 mg/dL — ABNORMAL HIGH (ref 70–99)
Glucose-Capillary: 144 mg/dL — ABNORMAL HIGH (ref 70–99)
Glucose-Capillary: 143 mg/dL — ABNORMAL HIGH (ref 70–99)

## 2022-07-20 LAB — PREPARE RBC (CROSSMATCH)

## 2022-07-20 LAB — MAGNESIUM: Magnesium: 2.2 mg/dL (ref 1.7–2.4)

## 2022-07-20 LAB — OCCULT BLOOD X 1 CARD TO LAB, STOOL: Fecal Occult Bld: POSITIVE — AB

## 2022-07-20 MED ORDER — SPIRONOLACTONE 12.5 MG HALF TABLET
12.5000 mg | ORAL_TABLET | Freq: Every day | ORAL | Status: DC
  Administered 2022-07-20: 12.5 mg
  Filled 2022-07-20: qty 1

## 2022-07-20 MED ORDER — SODIUM CHLORIDE 0.9% IV SOLUTION: Freq: Once | INTRAVENOUS | Status: AC

## 2022-07-20 MED ORDER — BANATROL TF EN LIQD
60.0000 mL | Freq: Two times a day (BID) | ENTERAL | Status: DC
  Administered 2022-07-20 – 2022-07-23 (×6): 60 mL
  Filled 2022-07-20 (×6): qty 60

## 2022-07-20 MED ORDER — POTASSIUM CHLORIDE 20 MEQ PO PACK: 20.0000 meq | PACK | Freq: Once | ORAL | Status: DC

## 2022-07-20 NOTE — Progress Notes (Signed)
ANTICOAGULATION CONSULT NOTE  Pharmacy Consult for heparin infusion Indication:  Afib with possible ACS  Allergies  Allergen Reactions   Zoloft [Sertraline Hcl] Nausea Only        Amoxapine And Related Rash   Cephalexin Rash   Oxycodone     "Loopy"   Penicillins Rash    Patient Measurements: Height: '5\' 1"'$  (154.9 cm) Weight: 70.2 kg (154 lb 12.2 oz) IBW/kg (Calculated) : 47.8 Heparin Dosing Weight: 60kg  Vital Signs: Temp: 97.7 F (36.5 C) (01/30 0743) Temp Source: Bladder (01/30 0400) BP: 107/50 (01/30 0743) Pulse Rate: 93 (01/30 0743)  Labs: Recent Labs    07/18/22 0416 07/19/22 0438 07/19/22 0600 07/19/22 1000 07/19/22 1401 07/19/22 2120 07/20/22 0405 07/20/22 0535  HGB 9.0* 6.6*   < > 7.8* 8.9* 7.5*  --  7.3*  HCT 27.6* 20.0*   < > 23.8* 25.6* 22.1*  --  21.6*  PLT 111* 128*   < > 136*  --  154  --  142*  HEPARINUNFRC 0.49 0.55  --   --   --   --   --   --   CREATININE 0.67 0.65  --   --   --   --  0.73  --    < > = values in this interval not displayed.     Estimated Creatinine Clearance: 56.2 mL/min (by C-G formula based on SCr of 0.73 mg/dL).   Assessment: 72yo female had witnessed arrest at home, required four rounds and shock x2 before ROSC, to begin heparin for Afib and possible ACS.  Heparin stopped yesterday with Hgb drop requiring transfusion. Hgb remains low at 7.3, received 1 PRBC yesterday. Plt stable, low at 142.   Goal of Therapy:  Heparin level 0.3-0.7 units/ml Monitor platelets by anticoagulation protocol: Yes   Plan:  Continue to hold IV heparin for now F/u CBC for plans to restart   Thank you for involving pharmacy in this patient's care.  Antonietta Jewel, PharmD, [place] Clinical Pharmacist  Phone: [phone] 07/20/2022 8:06 AM  Please check AMION for all [facility] phone numbers After 10:00 PM, call [place] [phone]

## 2022-07-20 NOTE — Progress Notes (Signed)
NAME:  Carmen Green, MRN:  [id number], DOB:  May 02, 1951, LOS: 41 ADMISSION DATE:  07/12/2022, CONSULTATION DATE: 1/22 REFERRING MD: Dr. Darl Householder EDP, CHIEF COMPLAINT: Cardiac arrest  History of Present Illness:  72 year old female with past medical history as below, which is significant for complete heart block status post pacemaker, mitral valve prolapse, GERD, and esophagitis.  She is also recently been undergoing additional cardiac workup after presenting to the cardiology office on 1/8 with new complaints of shortness of breath and fatigue.  Echocardiogram done 1/16 demonstrated newly reduced LVEF to 25% with severe mitral regurgitation.  There were plans for her to undergo TEE and left/right heart cath, however, she unfortunately presented to [facility] emergency department on 1/22 after suffering cardiac arrest at home at approximately 1830.  Bystanders report she fell to the ground.  Sounds like she did receive bystander CPR and first responders placed AED which did not advise shock.  After EMS arrived and placed cardiac monitor she was in PEA.  Rhythm did eventually changed to V-fib at which point she was defibrillated and ROSC was achieved.  King airway was exchanged for endotracheal tube in the emergency department.  Patient was hypotensive requiring norepinephrine infusion.  EKG with nonspecific possibly ischemic changes.  Cardiology was consulted by the EDP.  PCCM has been asked to admit.  Pertinent  Medical History   has a past medical history of Allergy, Anemia, Anxiety, Arthritis, Cataract, Colon polyp, Complete heart block ([place]), Contusion of chest wall (10/10/2007), Depression, Diverticulitis, Esophagitis, ESOPHAGITIS (06/25/2008), GERD (gastroesophageal reflux disease), H/O seasonal allergies, Heart murmur, Hiatal hernia, Hyperlipidemia, Hypertension, Mitral valve prolapse, and Pacemaker.   Significant Hospital Events: Including procedures, antibiotic start and stop dates in addition to  other pertinent events   1/23 Admission following cardiac arrest; EP consulted for runs of ventricular ectopy and turned pacer up to 90 with significant improvement; Echo LVEF <20% w/ severe MR 1/24 off levo; WBC trending up and tmax 99.3, cooling per TTM protocol; cultures sent and started on broad spectrum abx 1/25 serratia in trach cx> ctx> cefepime, weaning but mental status remains slow to improve 1/27 more awake but not consistently following commands.  Started on low-dose Precedex for tube tolerance.  Interim History / Subjective:  No changes.  Objective   Blood pressure (!) 107/50, pulse 93, temperature 97.7 F (36.5 C), resp. rate 19, height '5\' 1"'$  (1.549 m), weight 70.2 kg, SpO2 100 %. CVP:  [0 mmHg-9 mmHg] 5 mmHg  Vent Mode: PSV;CPAP FiO2 (%):  [30 %] 30 % Set Rate:  [14 bmp] 14 bmp PEEP:  [5 cmH20] 5 cmH20 Pressure Support:  [5 cmH20-10 cmH20] 10 cmH20 Plateau Pressure:  [18 cmH20] 18 cmH20   Intake/Output Summary (Last 24 hours) at 07/20/2022 0823 Last data filed at 07/20/2022 0800 Gross per 24 hour  Intake 2431.6 ml  Output 2025 ml  Net 406.6 ml    Filed Weights   07/18/22 0401 07/19/22 0500 07/20/22 0416  Weight: 69 kg 69.2 kg 70.2 kg   Physical Examination: General: Elderly female, resting in bed, in NAD. Neuro: On vent, non-responsive despite no sedation. HEENT: [place]/AT. Sclerae anicteric. ETT in place. Cardiovascular: RRR, no M/R/G.  Lungs: Respirations even and unlabored.  CTA bilaterally, No W/R/R. Abdomen: BS x 4, soft, NT/ND.  Musculoskeletal: No gross deformities, no edema.  Skin: Intact, warm, no rashes.   Assessment & Plan:   OOH arrest due to VT with 20 to 30-minute downtime. New acute on chronic HFrEF with  severe mitral regurgitation of unclear etiology.  Cardiogenic shock now resolved.  EF 20%. History of CHB s/p PPM, atrial flutter, HTN, HLD. - Supportive care. - Continue Amiodarone, Losartan, Coreg (holding SGLT2i d/t recent hx of  cystitis). - Heparin on hold given Hgb drop though no obvious bleeding, abdominal exam benign. - AHF following, appreciate the recs. - L/RHC and TEE at some point if has meaningful neuro recovery.  Concern for anoxic encephalopathy vs ongoing metabolic encephalopathy (? Cefepime toxicity). - Neuro following, appreciate the recs. - Unable to obtain MRI 2/2 PPM incompatibility. - Cefepime d/c;'d 1/29. - Avoid sedating agents.  Acute hypoxemic respiratory failure due to pulmonary edema and Serratia PNA (s/p course abx). Hypoxic ischemic encephalopathy following cardiac arrest. - Tolerates SBT but extubation is limited by mental status. - Avoid sedation.  Hx of Anemia - Hgb slightly down to 7.3 but no overt bleeding.  - 1u PRBC now. - Transfuse for Hgb < 8 given cardiac issues - Monitor for signs of active bleeding  Hx anxiety, depression. - Hold home Buspirone, Lorazepam.  Best Practice (right click and "Reselect all SmartList Selections" daily)   Diet/type: NPO; TF  DVT prophylaxis: systemic heparin on hold given Hgb drop GI prophylaxis: H2B Lines: Central line Foley:  Yes, and it is still needed Code Status:  DNR Last date of multidisciplinary goals of care discussion [1/27 updated husband at bedside]   CC time: 35 min.   Montey Hora, [facility] For pager details, please see AMION or use Epic chat  After 1900, please call [facility] for cross coverage needs 07/20/2022, 8:46 AM

## 2022-07-20 NOTE — Progress Notes (Addendum)
Patient ID: Carmen Green, female   DOB: 10-29-1950, 72 y.o.   MRN: [id number]     Advanced Heart Failure Rounding Note  PCP-Cardiologist: None   Subjective:    She remains intubated, Off Precedex.  Withdraws to pain but still not following commands. No purposeful movement.   Neuro concerned for possible cefepime toxicity. Cefepime discontinued. Now covering w/ [name].   CVP 4 this morning.  Scr stable 0.7, K 3.9   She is a-paced on amiodarone gtt.   Off heparin due to anemia. Hgb back down again despite 1u RBC transfusion yesterday. Hgb 7.3 today. No gross bleeding.    Objective:   Weight Range: 70.2 kg Body mass index is 29.24 kg/m.   Vital Signs:   Temp:  [97 F (36.1 C)-99.9 F (37.7 C)] 97.7 F (36.5 C) (01/30 0743) Pulse Rate:  [73-102] 93 (01/30 0743) Resp:  [14-36] 19 (01/30 0743) BP: (96-154)/(50-94) 107/50 (01/30 0743) SpO2:  [91 %-100 %] 100 % (01/30 0743) FiO2 (%):  [30 %] 30 % (01/30 0743) Weight:  [70.2 kg] 70.2 kg (01/30 0416) Last BM Date : 07/19/22  Weight change: Filed Weights   07/18/22 0401 07/19/22 0500 07/20/22 0416  Weight: 69 kg 69.2 kg 70.2 kg    Intake/Output:   Intake/Output Summary (Last 24 hours) at 07/20/2022 0808 Last data filed at 07/20/2022 0800 Gross per 24 hour  Intake 2431.6 ml  Output 2025 ml  Net 406.6 ml      Physical Exam   CVP 4 General: Intubated. Not following commands  Neck: No JVD, no thyromegaly or thyroid nodule.  Lungs: intubated and clear CV: Nondisplaced PMI.  Heart regular S1/S2, no S3/S4,1/6 HSM apex.  No peripheral edema.   Abdomen: Soft, nontender, no hepatosplenomegaly, no distention.  Skin: Intact without lesions or rashes.  Neurologic: Withdraws to painful stimuli. Not following commands  Extremities: No clubbing or cyanosis.  HEENT: Normal.  + ETT   Telemetry   A-V sequential pacing 90s (personally reviewed)  EKG    N/A  Labs    CBC Recent Labs    07/19/22 0600 07/19/22 1000  07/19/22 2120 07/20/22 0535  WBC 9.0   < > 9.5 9.7  NEUTROABS 7.0  --   --   --   HGB 7.3*   < > 7.5* 7.3*  HCT 22.4*   < > 22.1* 21.6*  MCV 102.3*   < > 99.5 100.9*  PLT 142*   < > 154 142*   < > = values in this interval not displayed.   Basic Metabolic Panel Recent Labs    07/18/22 0416 07/19/22 0438 07/20/22 0405  NA 137 138 140  K 4.2 3.9 3.9  CL 108 109 108  CO2 '24 24 26  '$ GLUCOSE 150* 146* 162*  BUN 25* 28* 30*  CREATININE 0.67 0.65 0.73  CALCIUM 8.9 8.7* 8.8*  MG 2.0  --  2.2   Liver Function Tests No results for input(s): "AST", "ALT", "ALKPHOS", "BILITOT", "PROT", "ALBUMIN" in the last 72 hours.   BNP: BNP (last 3 results) Recent Labs    07/12/22 0057  BNP 547.6*    ProBNP (last 3 results) Recent Labs    06/28/22 1040  PROBNP 3,230*   Fasting Lipid Panel No results for input(s): "CHOL", "HDL", "LDLCALC", "TRIG", "CHOLHDL", "LDLDIRECT" in the last 72 hours.  Imaging    EEG adult  Result Date: 07/19/2022 Lora Havens, MD     07/19/2022 10:01 AM Patient Name: Carmen Nicodemus  Green MRN: [id number] Epilepsy Attending: Lora Havens Referring Physician/Provider: Kipp Brood, MD Date: 07/19/2022 Duration: 27.50 mins Patient history: 72yo F ith ams. EEG to evaluate for seizure Level of alertness: Awake AEDs during EEG study: None Technical aspects: This EEG study was done with scalp electrodes positioned according to the 10-20 International system of electrode placement. Electrical activity was reviewed with band pass filter of 1-'70Hz'$ , sensitivity of 7 uV/mm, display speed of 12m/sec with a '60Hz'$  notched filter applied as appropriate. EEG data were recorded continuously and digitally stored.  Video monitoring was available and reviewed as appropriate. Description: No clear posterior dominant rhythm was seen.  EEG showed continuous generalized predominantly 5 to 6 Hz theta slowing admixed with intermittent generalized 2 to 3 Hz delta slowing.  At times,  generalized periodic discharges with triphasic morphology were noted at 1 to 2 Hz, more frequent with stimulation.  Hyperventilation and photic stimulation were not performed.   ABNORMALITY - Periodic discharges with triphasic morphology, generalized ( GPDs) -Continuous slow, generalized IMPRESSION: This study showed generalized periodic discharges with triphasic morphology likely due to toxic-metabolic causes like cefepime toxicity.  Additionally there is moderate degree of encephalopathy.  No seizures or definite epileptiform discharges were seen throughout the recording. Priyanka OBarbra Sarks    Medications:     Scheduled Medications:  sodium chloride   Intravenous Once   aspirin  81 mg Per Tube Daily   carvedilol  3.125 mg Oral BID WC   Chlorhexidine Gluconate Cloth  6 each Topical Daily   feeding supplement (PROSource TF20)  60 mL Per Tube Daily   losartan  12.5 mg Oral Daily   mouth rinse  15 mL Mouth Rinse Q2H   pantoprazole (PROTONIX) IV  40 mg Intravenous Q12H    Infusions:  sodium chloride 10 mL/hr at 07/20/22 0700   amiodarone 30 mg/hr (07/20/22 0700)   cefTAZidime (FORTAZ)  IV 2 g (07/20/22 0756)   dexmedetomidine (PRECEDEX) IV infusion Stopped (07/19/22 0819)   feeding supplement (VITAL 1.5 CAL) 45 mL/hr at 07/20/22 0700    PRN Medications: sodium chloride, acetaminophen **OR** acetaminophen (TYLENOL) oral liquid 160 mg/5 mL **OR** acetaminophen, fentaNYL (SUBLIMAZE) injection, ondansetron (ZOFRAN) IV, mouth rinse    Patient Profile   72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  Assessment/Plan   1. PEA Arrest--> V Fib--> shock: Witnessed arrest. CPR/EPI/Shock x2 with ROSC.  Unclear etiology/ Suspect ischemic event.  TSH ok. HS Trop 463-->753.  - LHC if she has meaningful neurologic recovery.  - Currently on amiodarone gtt.  2. Neuro: Post-arrest, she responds to  painful stimuli but nothing purposeful at this point.  She remains on Precedex.  Head CT with nothing acute.  - EEG c/w moderate degree of encephalopathy.  No seizures  - neurology following. Concerned for possible cefepime toxicity. Cefepime discontinued. Now covering w/ FTressie Ellis  3. Acute Hypoxic Respiratory Failure: With aspiration PNA.  She remains intubated.  - CCM following; extubation when able.  4. Acute/Chronic HFrEF: Echo this admission with EF < 20%, RV low normal, ?moderate MR. Cause uncertain.  Cannot rule out CAD.  Also with possible severe MR on a prior echo and has been chronically RV paced in setting of CHB. CVP 4 today.  - Hold diuretics today  - Will need LHC/RHC if she has neurological recovery.  - Co-ox 78% - Continue losartan 12.5 mg as BP allows.  -  Continue Coreg 3.125 mg bid.  - Avoid SGLT2i for now with cystitis 2 occasions last year.  5. Atrial flutter: Paroxysmal.  She is now a-paced.  - Continue amio drip.  - Off heparin due to anemia 6.  PVCs: Frequent PVCs.  - Continue amio drip.  7. MR: Severe on echo prior to admission, echo here was ?moderate.  - If she has neurologic recovery, will need TEE.  8.  H/O CHB--> St Jude PPM: Gen changed 2004, 2012.  Abandoned R side device. L sided device implanted 2021.  9. Aspiration PNA: Tracheal aspirate + serratia. BCx NGTD  - initially on cefepime. Neurology concerned for possible cefepime toxicity. Cefepime discontinued. Now covering w/ [name].  10. Anemia: Hgb 6.6 => 7.5=>transfused 1uRBC=>7.8=>7.3 today. No overt bleeding.  - Check FOBT  11.  DNR  - if no neurological improvement in the next 24-48 hr, would recommend palliative care consultation   Lyda Jester, PA-C 07/20/2022 8:08 AM  Patient seen with PA, agree with the above note .  Concern for cefepime neuro toxicity, cefepime stopped and ceftazidime started for Serratia PNA.   Withdraws from pain, still no purposeful activity.   CVP 4.  Hgb down  again to 7.3 but no over bleeding.  She is off heparin gtt, a-paced currently.   General: NAD Neck: No JVD, no thyromegaly or thyroid nodule.  Lungs: Clear to auscultation bilaterally with normal respiratory effort. CV: Nondisplaced PMI.  Heart regular S1/S2, no S3/S4, no murmur.  No peripheral edema.  No carotid bruit.  Normal pedal pulses.  Abdomen: Soft, nontender, no hepatosplenomegaly, no distention.  Skin: Intact without lesions or rashes.  Neurologic: Withdraws to pain but no purposeful movement.  +Tremors.  Extremities: No clubbing or cyanosis.  HEENT: Normal.   Stable hemodynamically.  Will add spironolactone 12.5 daily.  Does not need aggressive diuresis with CVP 4.   Hgb 7.3, no overt bleeding.  Site of blood loss unclear.  - Leave off heparin gtt for now.  - Will give additional 1 unit PRBCs.  - If hgb continues to trend down, consider CT abdomen/pelvis for spontaneous RP bleed.   She is in NSR on amiodarone gtt, off heparin with anemia.   Now on ceftazidime for Serratia PNA .  No purposeful movement, withdraws from pain.  ?Anoxic encephalopathy post-arrest vs cefepime toxicity.  Currently off cefepime and watching for improvement.  Neurology following .  CRITICAL CARE Performed by: Loralie Champagne  Total critical care time: 35 minutes  Critical care time was exclusive of separately billable procedures and treating other patients.  Critical care was necessary to treat or prevent imminent or life-threatening deterioration.  Critical care was time spent personally by me on the following activities: development of treatment plan with patient and/or surrogate as well as nursing, discussions with consultants, evaluation of patient's response to treatment, examination of patient, obtaining history from patient or surrogate, ordering and performing treatments and interventions, ordering and review of laboratory studies, ordering and review of radiographic studies, pulse oximetry  and re-evaluation of patient's condition.   Loralie Champagne 07/20/2022 9:21 AM

## 2022-07-20 NOTE — Progress Notes (Signed)
Nutrition Follow-up  DOCUMENTATION CODES:   Not applicable  INTERVENTION:   Recommend considering Cortrak pending GOC  Tube Feeding via OG:  Vital 1.5 at 45 ml/hr Pro-Source TF20 60 mL  This provides 1700 kcals, 93 g of protein and 730 mL of free water  Add Banatrol TF BID, each packet provides 5 g of soluble fiber  NUTRITION DIAGNOSIS:   Inadequate oral intake related to acute illness as evidenced by NPO status.  Being addressed via TF  GOAL:   Patient will meet greater than or equal to 90% of their needs  Met  MONITOR:   TF tolerance, Vent status, Labs, Weight trends  REASON FOR ASSESSMENT:   Ventilator    ASSESSMENT:   72 yo admitted post OOH arrest requiring intubation, acute on chronic CHF, shock, AKI. PMH includes CHF, CHB s/p PPM, HTN, HLD  Pt remains on vent support. Off sedation,no purposeful movement, responds to pain. Tolerates vent wean but mental status prevents extubation. Note Neurology following and questioning cefepime toxicity  Noted plan for heart cath and mitraclip depending on neurostatus  Tolerating Vital 1.5 at 45 ml/hr via OG  Type 7 stool via rectal tube  No pressure injuries noted per RN skin assessment   Labs: reviewed Meds: reviewed   Diet Order:   Diet Order     None       EDUCATION NEEDS:   Not appropriate for education at this time  Skin:  Skin Assessment: Reviewed RN Assessment  Last BM:  1/30 rectal tube  Height:   Ht Readings from Last 1 Encounters:  07/12/22 '5\' 1"'$  (1.549 m)    Weight:   Wt Readings from Last 1 Encounters:  07/20/22 70.2 kg     BMI:  Body mass index is 29.24 kg/m.  Estimated Nutritional Needs:   Kcal:  1500-1700 kcals  Protein:  75-95 g  Fluid:  >/= 1.5 L  Kerman Passey MS, RDN, LDN, CNSC Registered Dietitian 3 Clinical Nutrition RD Pager and On-Call Pager Number Located in [place]

## 2022-07-21 ENCOUNTER — Inpatient Hospital Stay (HOSPITAL_COMMUNITY): Payer: Medicare Other

## 2022-07-21 DIAGNOSIS — I469 Cardiac arrest, cause unspecified: Secondary | ICD-10-CM | POA: Diagnosis not present

## 2022-07-21 LAB — COOXEMETRY PANEL
Carboxyhemoglobin: 2 % — ABNORMAL HIGH (ref 0.5–1.5)
Methemoglobin: <0.7 % (ref 0.0–1.5)
O2 Saturation: 72.9 %
Total hemoglobin: 8.1 g/dL — ABNORMAL LOW (ref 12.0–16.0)

## 2022-07-21 LAB — TYPE AND SCREEN
ABO/RH(D): O POS
Antibody Screen: NEGATIVE
Unit division: 0
Unit division: 0

## 2022-07-21 LAB — GLUCOSE, CAPILLARY
Glucose-Capillary: 110 mg/dL — ABNORMAL HIGH (ref 70–99)
Glucose-Capillary: 116 mg/dL — ABNORMAL HIGH (ref 70–99)
Glucose-Capillary: 120 mg/dL — ABNORMAL HIGH (ref 70–99)
Glucose-Capillary: 126 mg/dL — ABNORMAL HIGH (ref 70–99)
Glucose-Capillary: 126 mg/dL — ABNORMAL HIGH (ref 70–99)
Glucose-Capillary: 131 mg/dL — ABNORMAL HIGH (ref 70–99)
Glucose-Capillary: 136 mg/dL — ABNORMAL HIGH (ref 70–99)

## 2022-07-21 LAB — BPAM RBC
Blood Product Expiration Date: 202403012359
Blood Product Expiration Date: 202403042359
ISSUE DATE / TIME: 202401290958
ISSUE DATE / TIME: 202401300947
Unit Type and Rh: 5100
Unit Type and Rh: 5100

## 2022-07-21 LAB — CBC
HCT: 23.5 % — ABNORMAL LOW (ref 36.0–46.0)
Hemoglobin: 7.9 g/dL — ABNORMAL LOW (ref 12.0–15.0)
MCH: 33.8 pg (ref 26.0–34.0)
MCHC: 33.6 g/dL (ref 30.0–36.0)
MCV: 100.4 fL — ABNORMAL HIGH (ref 80.0–100.0)
Platelets: 162 10*3/uL (ref 150–400)
RBC: 2.34 MIL/uL — ABNORMAL LOW (ref 3.87–5.11)
RDW: 13.7 % (ref 11.5–15.5)
WBC: 10.1 10*3/uL (ref 4.0–10.5)
nRBC: 0 % (ref 0.0–0.2)

## 2022-07-21 LAB — BASIC METABOLIC PANEL
Anion gap: 8 (ref 5–15)
BUN: 23 mg/dL (ref 8–23)
CO2: 26 mmol/L (ref 22–32)
Calcium: 8.5 mg/dL — ABNORMAL LOW (ref 8.9–10.3)
Chloride: 105 mmol/L (ref 98–111)
Creatinine, Ser: 0.63 mg/dL (ref 0.44–1.00)
GFR, Estimated: >60 mL/min (ref >60)
Glucose, Bld: 145 mg/dL — ABNORMAL HIGH (ref 70–99)
Potassium: 4 mmol/L (ref 3.5–5.1)
Sodium: 139 mmol/L (ref 135–145)

## 2022-07-21 MED ORDER — SPIRONOLACTONE 25 MG PO TABS
25.0000 mg | ORAL_TABLET | Freq: Every day | ORAL | Status: DC
  Administered 2022-07-21 – 2022-07-23 (×3): 25 mg
  Filled 2022-07-21 (×3): qty 1

## 2022-07-21 MED ORDER — FUROSEMIDE 10 MG/ML IJ SOLN
40.0000 mg | Freq: Once | INTRAMUSCULAR | Status: AC
  Administered 2022-07-21: 40 mg via INTRAVENOUS
  Filled 2022-07-21: qty 4

## 2022-07-21 MED ORDER — DEXMEDETOMIDINE HCL IN NACL 400 MCG/100ML IV SOLN
0.0000 ug/kg/h | INTRAVENOUS | Status: DC
  Administered 2022-07-21: 0.2 ug/kg/h via INTRAVENOUS
  Administered 2022-07-22: 0.8 ug/kg/h via INTRAVENOUS
  Filled 2022-07-21 (×3): qty 100

## 2022-07-21 NOTE — Procedures (Signed)
Extubation Procedure Note  Patient Details:   Name: Carmen Green DOB: 26-Jun-1950 MRN: [id number]   Airway Documentation:    Vent end date: 07/21/22 Vent end time: 1115   Evaluation  O2 sats: stable throughout Complications: No apparent complications Patient did tolerate procedure well. Bilateral Breath Sounds: Clear   Patient extubated 2 liter nasal cannula, postive cuff leak prior to extubation.  Yaritzi Craun 07/21/2022, 11:24 AM

## 2022-07-21 NOTE — Procedures (Signed)
Cortrak  Person Inserting Tube:  Maylon Peppers C, RD Tube Type:  Cortrak - 43 inches Tube Size:  10 Tube Location:  Left nare Secured by: Bridle Technique Used to Measure Tube Placement:  Marking at nare/corner of mouth Cortrak Secured At:  63 cm   Cortrak Tube Team Note:  Consult received to place a Cortrak feeding tube.   X-ray is required, abdominal x-ray has been ordered by the Cortrak team. Please confirm tube placement before using the Cortrak tube.   If the tube becomes dislodged please keep the tube and contact the Cortrak team at [email or web address] for replacement.  If after hours and replacement cannot be delayed, place a NG tube and confirm placement with an abdominal x-ray.   Lockie Pares., RD, LDN, CNSC See AMiON for contact information

## 2022-07-21 NOTE — Progress Notes (Signed)
PT Cancellation Note  Patient Details Name: Carmen Green MRN: [id number] DOB: 1951/04/29   Cancelled Treatment:    Reason Eval/Treat Not Completed: Patient's level of consciousness. Pt with eyes closed, restless movements. Not following any commands. Just extubated this AM. Will follow up tomorrow.    Shary Decamp [facility] 07/21/2022, 1:32 PM [place] Office [phone]

## 2022-07-21 NOTE — Progress Notes (Signed)
Neurology Progress Note  Brief HPI: Carmen Green is a 72 y.o. female with history of complete heart block status post pacemaker, mitral valve prolapse, GERD, CHF with EF of 25% and esophagitis who initially presented after cardiac arrest with unclear downtime on 1/22.  Patient received bystander CPR and 1 shock with ROSC achieved then.  She has been intubated in the ICU since then and has developed twitching movements of bilateral upper extremities, jaw and shoulders.    Subjective: Patient seen in room. Remains intubated but on SBT. Extubation limited by mental status. Eyes open, agitated in the bed. Does not follow commands but moving all extremities.   Exam: Vitals:   07/21/22 0500 07/21/22 0600  BP: 136/64 (!) 109/57  Pulse: 90 92  Resp: (!) 24 12  Temp: 99.3 F (37.4 C) 98.6 F (37 C)  SpO2: 100% 100%   Gen: intubated, NAD Resp: mechanically ventilated  Abd: soft, nt   NEURO:  Mental Status: Patient is in bed with eyes open but cannot follow commands and does not track examiner Cranial Nerves:  PERRL, corneal reflex intact, cough and gag intact, positive oculocephalic reflex, does not track  Motor: Moves bilateral upper extremities with antigravity strength, moves toes and feet on bilateral lower extremities spontaneously and towards painful stimuli.  Fine tremor seen in bilateral upper extremities Sensation: Moves all extremities to noxious stimuli      Imaging Reviewed: CT-scan of the brain 1/23: No acute abnormality   CT head 1/26: No acute abnormality   MRI examination of the brain: Unable to perform due to pacemaker   QPY:PPJK study showed generalized periodic discharges with triphasic morphology likely due to toxic-metabolic causes like cefepime toxicity.  Additionally there is moderate degree of encephalopathy.  No seizures or definite epileptiform discharges were seen throughout the recording   Assessment:  72 year old patient with above past medical history  status postcardiac arrest with bystander CPR and ROSC achieved after unknown downtime now has developed twitching movements of the bilateral upper extremities, jaw and shoulders. Right stem reflexes are preserved, however patient is unable to follow commands and displays only nonpurposeful movement of extremities. CT head has been performed twice and shows no acute abnormalities, MRI unable to be performed due to pacemaker. EEG shows GPD's with triphasic morphology, likely due to cefepime toxicity. Cefepime toxicity is the likely source of patient's twitching movements, would switch to alternate antibiotic and reassess in 72 hours. Cefepime from 1/25-1/29.   Impression:  Myoclonic jerking in the setting of cefepime toxicity   Recommendations: - Continue fortaz - Extubate per CCM - Prognosticate 2/1  Patient seen and examined by NP/APP with MD. MD to update note as needed.   Janine Ores, DNP, FNP-BC [facility] Pager: [phone]   NEUROHOSPITALIST ADDENDUM Performed a face to face diagnostic evaluation.   I have reviewed the contents of history and physical exam as documented by PA/ARNP/Resident and agree with above documentation.  I have discussed and formulated the above plan as documented. Edits to the note have been made as needed.  Impression/Key exam findings/Plan: movements seem semi-purposeful today and become much more vigorous with stimulation. Closes eyes tight shut and moves her head left to right to prevent me from opening her eyes. Hopefully, Cefepime should wear off by tomorrow.   From a neuro standpoint, prognosis is indeterminate and needs time. Will evaluate her tomorrow again.  Donnetta Simpers, MD [facility] [phone]   If 7pm to 7am, please call on call as listed  on AMION.

## 2022-07-21 NOTE — Progress Notes (Addendum)
Patient ID: Carmen Green, female   DOB: September 27, 1950, 72 y.o.   MRN: [id number]     Advanced Heart Failure Rounding Note  PCP-Cardiologist: None   Subjective:    Neuro concerned for possible cefepime toxicity. Cefepime discontinued. Now covering w/ [name].  She remains intubated. RN reports pt was intermittently following commands earlier this morning (moving toes to command) but just got dose of fentanyl for agitation. Not currently arousable.    Co-ox 73%. CVP 6.   CVP 4 this morning.  Scr stable 0.63.   She is a-paced on amiodarone gtt.   Off heparin due to anemia. FOBT+. Required transfusion yesterday, Hgb 7.4>>8.3.. F/u CBC pending.   Objective:   Weight Range: 67.2 kg Body mass index is 27.99 kg/m.   Vital Signs:   Temp:  [97 F (36.1 C)-100.4 F (38 C)] 98.8 F (37.1 C) (01/31 0743) Pulse Rate:  [86-93] 90 (01/31 0743) Resp:  [12-32] 21 (01/31 0743) BP: (109-162)/(47-118) 109/87 (01/31 0743) SpO2:  [95 %-100 %] 100 % (01/31 0743) FiO2 (%):  [30 %] 30 % (01/31 0743) Weight:  [67.2 kg] 67.2 kg (01/31 0203) Last BM Date : 07/20/22  Weight change: Filed Weights   07/19/22 0500 07/20/22 0416 07/21/22 0203  Weight: 69.2 kg 70.2 kg 67.2 kg    Intake/Output:   Intake/Output Summary (Last 24 hours) at 07/21/2022 0828 Last data filed at 07/21/2022 0700 Gross per 24 hour  Intake 2465.34 ml  Output 940 ml  Net 1525.34 ml      Physical Exam   CVP 6  General: Intubated and sedated  Neck: No JVD, no thyromegaly or thyroid nodule.  Lungs: intubated and clear CV: Nondisplaced PMI.  Heart regular S1/S2, no S3/S4,1/6 HSM apex.  No peripheral edema.   Abdomen: Soft, nontender, no hepatosplenomegaly, no distention.  Skin: Intact without lesions or rashes.  Neurologic: Intubated, intermittently following commands  Extremities: No clubbing or cyanosis.  HEENT: Normal.  + ETT   Telemetry   A-V sequential pacing 90s (personally reviewed)  EKG    N/A  Labs     CBC Recent Labs    07/19/22 0600 07/19/22 1000 07/20/22 1539 07/20/22 2123  WBC 9.0   < > 10.0 10.5  NEUTROABS 7.0  --   --   --   HGB 7.3*   < > 8.3* 8.3*  HCT 22.4*   < > 24.5* 25.5*  MCV 102.3*   < > 99.2 101.6*  PLT 142*   < > 171 169   < > = values in this interval not displayed.   Basic Metabolic Panel Recent Labs    07/20/22 0405 07/21/22 0407  NA 140 139  K 3.9 4.0  CL 108 105  CO2 26 26  GLUCOSE 162* 145*  BUN 30* 23  CREATININE 0.73 0.63  CALCIUM 8.8* 8.5*  MG 2.2  --    Liver Function Tests No results for input(s): "AST", "ALT", "ALKPHOS", "BILITOT", "PROT", "ALBUMIN" in the last 72 hours.   BNP: BNP (last 3 results) Recent Labs    07/12/22 0057  BNP 547.6*    ProBNP (last 3 results) Recent Labs    06/28/22 1040  PROBNP 3,230*   Fasting Lipid Panel No results for input(s): "CHOL", "HDL", "LDLCALC", "TRIG", "CHOLHDL", "LDLDIRECT" in the last 72 hours.  Imaging    No results found.   Medications:     Scheduled Medications:  sodium chloride   Intravenous Once   aspirin  81 mg Per Tube  Daily   carvedilol  3.125 mg Oral BID WC   Chlorhexidine Gluconate Cloth  6 each Topical Daily   feeding supplement (PROSource TF20)  60 mL Per Tube Daily   fiber supplement (BANATROL TF)  60 mL Per Tube BID   losartan  12.5 mg Oral Daily   mouth rinse  15 mL Mouth Rinse Q2H   pantoprazole (PROTONIX) IV  40 mg Intravenous Q12H   spironolactone  12.5 mg Per Tube Daily    Infusions:  sodium chloride 10 mL/hr at 07/21/22 0700   amiodarone 30 mg/hr (07/21/22 0700)   cefTAZidime (FORTAZ)  IV Stopped (07/21/22 0030)   dexmedetomidine (PRECEDEX) IV infusion Stopped (07/19/22 0819)   feeding supplement (VITAL 1.5 CAL) 45 mL/hr at 07/21/22 0700    PRN Medications: sodium chloride, acetaminophen **OR** acetaminophen (TYLENOL) oral liquid 160 mg/5 mL **OR** acetaminophen, fentaNYL (SUBLIMAZE) injection, ondansetron (ZOFRAN) IV, mouth rinse    Patient  Profile   71 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  Assessment/Plan   1. PEA Arrest--> V Fib--> shock: Witnessed arrest. CPR/EPI/Shock x2 with ROSC.  Unclear etiology/ Suspect ischemic event.  TSH ok. HS Trop 463-->753.  - LHC if she has meaningful neurologic recovery.  - Currently on amiodarone gtt.  2. Neuro: Post-arrest, she responds to painful stimuli but nothing purposeful at this point.  She remains on Precedex.  Head CT with nothing acute.  - EEG c/w moderate degree of encephalopathy.  No seizures  - neurology following. Concerned for possible cefepime toxicity. Cefepime discontinued. Now covering w/ [name].  - needs 72h washout for cefepime before we can consider exam for prognostication per neuro. RN reports pt now intermittently following commands  3. Acute Hypoxic Respiratory Failure: With aspiration PNA.  She remains intubated.  - CCM following; extubation when able.  4. Acute/Chronic HFrEF: Echo this admission with EF < 20%, RV low normal, ?moderate MR. Cause uncertain.  Cannot rule out CAD.  Also with possible severe MR on a prior echo and has been chronically RV paced in setting of CHB. Co-ox 73%. CVP 6 today.  - Hold lasix today  - Continue spiro 12.5 mg bid  - Continue losartan 12.5 mg  - Continue Coreg 3.125 mg bid.  - Will need LHC/RHC if she has neurological recovery.  - Avoid SGLT2i for now with cystitis 2 occasions last year.  5. Atrial flutter: Paroxysmal.  She is now a-paced.  - Continue amio drip.  - Off heparin due to anemia 6.  PVCs: Frequent PVCs.  - Continue amio drip.  7. MR: Severe on echo prior to admission, echo here was ?moderate.  - If she has neurologic recovery, will need TEE.  8.  H/O CHB--> St Jude PPM: Gen changed 2004, 2012.  Abandoned R side device. L sided device implanted 2021.  9. Aspiration PNA: Tracheal aspirate + serratia. BCx NGTD  -  initially on cefepime. Neurology concerned for possible cefepime toxicity. Cefepime discontinued. Now covering w/ [name].  10. Anemia: Hgb 6.6 => 7.5=>transfused 1uRBC=>7.8=>7.3=>8.3=>pending. No overt bleeding. FOBT+  - check f/u CBC today. Transfuse to keep hgb > 7.5 - will hold off on GI consultation for now until we see if any meaningful neurological recovery 11.  DNR  - needs 72h washout for cefepime before we can consider exam for prognostication per neuro.  - if no neurological improvement in the next 24-48 hr, would  recommend palliative care consultation   Nelida Gores 07/21/2022 8:28 AM  Patient seen with PA, agree with the above note.   She is moving all limbs this morning, ?some purposeful (looks like she is trying to pull out tube).    CVP 9, co-ox 73%.  She remains A-V sequentially paced.   FOBT+, has had 2 units PRBCs.    General: trying to pull out vent.  Neck: No JVD, no thyromegaly or thyroid nodule.  Lungs: Clear to auscultation bilaterally with normal respiratory effort. CV: Nondisplaced PMI.  Heart regular S1/S2, no S3/S4, no murmur.  No peripheral edema.   Abdomen: Soft, nontender, no hepatosplenomegaly, no distention.  Skin: Intact without lesions or rashes.  Neurologic: Moving all 4 limbs, ?purposeful. Extremities: No clubbing or cyanosis.  HEENT: Normal.   Serratia aspiration PNA on ceftazidime.   FOBT+ but not overtly bleeding.  Has had 2 units PRBCs.  Anticoagulated for atrial flutter but currently off heparin gtt with GI bleeding.   - CBC today, if stable will challenge again with heparin gtt.   She is a-paced on amiodarone.   CVP 9, will give dose of Lasix 40 mg IV x 1 as plan is to try to extubate today.  Increase spironolactone to 25 mg daily.   She is moving all 4 limbs, some purposeful movement noted.  Possible cefepime neurotoxicity improving as cefepime washes out.  Plan to try to extubate today  Will need TEE and RHC/LHC if she  has neurologic recovery.  Maybe Friday if she does well with extubation.   CRITICAL CARE Performed by: Loralie Champagne  Total critical care time: 40 minutes  Critical care time was exclusive of separately billable procedures and treating other patients.  Critical care was necessary to treat or prevent imminent or life-threatening deterioration.  Critical care was time spent personally by me on the following activities: development of treatment plan with patient and/or surrogate as well as nursing, discussions with consultants, evaluation of patient's response to treatment, examination of patient, obtaining history from patient or surrogate, ordering and performing treatments and interventions, ordering and review of laboratory studies, ordering and review of radiographic studies, pulse oximetry and re-evaluation of patient's condition.   Loralie Champagne 07/21/2022 9:57 AM

## 2022-07-21 NOTE — Progress Notes (Addendum)
NAME:  Carmen Green, MRN:  [id number], DOB:  [birth year], LOS: 9 ADMISSION DATE:  07/12/2022, CONSULTATION DATE: 07/12/2022 REFERRING MD: Darl Householder - EDP, CHIEF COMPLAINT: Cardiac arrest  History of Present Illness:  72 year old woman who presented to [facility] 1/22 after OOH cardiac arrest. PMHx significant for complete heart block (s/p pacemaker placement), mitral valve prolapse, GERD and esophagitis.  Patient recently been undergoing additional cardiac workup after presenting to the Cardiology office 1/8 with new complaints of SOB and fatigue.  Echo 1/16 demonstrated newly reduced LVEF to 25% with severe MR.  There were plans for her to undergo TEE and L/RHC; however, she unfortunately suffered cardiac arrest at home at approximately 1830 1/22.  Bystanders report she fell to the ground.  Sounds like she did receive bystander CPR and first responders placed AED which did not advise shock.  After EMS arrived and placed cardiac monitor, patient was in PEA.  Rhythm did eventually changed to V-fib at which point she was defibrillated and ROSC was achieved.  King airway was exchanged for ETT in ED.  Patient was hypotensive requiring norepinephrine infusion.  EKG with nonspecific possibly ischemic changes.  Cardiology was consulted by the EDP.    PCCM consulted for ICU admission.  Pertinent Medical History:   Past Medical History:  Diagnosis Date   Allergy    Anemia    Anxiety    Arthritis    Cataract    bil cataracts removed   Colon polyp    Complete heart block ([place])    Contusion of chest wall 10/10/2007   Centricity Description: CHEST WALL CONTUSION Qualifier: Diagnosis of  By: Aline Brochure MD, Dorothyann Peng   Centricity Description: CONTUSION, RIGHT RIB Qualifier: Diagnosis of  By: Aline Brochure MD, Stanley     Depression    Diverticulitis    Esophagitis    ESOPHAGITIS 06/25/2008   Qualifier: History of  By: Harlon Ditty CMA (AAMA), Dottie     GERD (gastroesophageal reflux disease)    H/O seasonal allergies     Heart murmur    Hiatal hernia    Hyperlipidemia    s/p PPM implant by Dr Olevia Perches 1992 with generator change 2004 ([facility]), she is device dependant   Hypertension    Mitral valve prolapse    Pacemaker    Significant Hospital Events: Including procedures, antibiotic start and stop dates in addition to other pertinent events   1/23 Admission following cardiac arrest; EP consulted for runs of ventricular ectopy and turned pacer up to 90 with significant improvement; Echo LVEF <20% w/ severe MR 1/24 off levo; WBC trending up and tmax 99.3, cooling per TTM protocol; cultures sent and started on broad spectrum abx 1/25 serratia in trach cx> ctx> cefepime, weaning but mental status remains slow to improve 1/27 more awake but not consistently following commands.  Started on low-dose Precedex for tube tolerance. 1/31 Improving neuro exam, +purposeful movement.Weaning well on PSV 10/5, extubate.  Interim History / Subjective:  No significant events overnight Improved neuro exam; now following commands in LE, squeezing eyes closed when trying to examine, robust cough/gag, purposefully reaching for ETT Weaning well on PSV 10/5 Plan to extubate today  Objective:  Blood pressure 109/87, pulse 90, temperature 98.8 F (37.1 C), resp. rate (!) 21, height '5\' 1"'$  (1.549 m), weight 67.2 kg, SpO2 100 %. CVP:  [4 mmHg-7 mmHg] 6 mmHg  Vent Mode: PSV;CPAP FiO2 (%):  [30 %] 30 % Set Rate:  [14 bmp] 14 bmp PEEP:  [5 cmH20] 5 cmH20 Pressure Support:  [  [place] Pressure:  [13 cmH20-16 cmH20] 16 cmH20   Intake/Output Summary (Last 24 hours) at 07/21/2022 0754 Last data filed at 07/21/2022 0700 Gross per 24 hour  Intake 2495.34 ml  Output 1040 ml  Net 1455.34 ml    Filed Weights   07/19/22 0500 07/20/22 0416 07/21/22 0203  Weight: 69.2 kg 70.2 kg 67.2 kg   Physical Examination: General: Acutely ill-appearing elderly woman in NAD. Intermittently agitated with stimulation/exam. HEENT:  [place]/AT, anicteric sclera, PERRL 73m, moist mucous membranes. ETT in place. Neuro:  Sedated, intubated.  Responds to verbal stimuli. Following commands with BLE.Moves all 4 extremities spontaneously. Generalized weakness. +Corneal, +Cough, and +Gag  CV: RRR, no m/g/r. PULM: Breathing even and unlabored on vent (PSV 10/5, FiO2 30%). Lung fields CTAB anteriorly. GI: Soft, nontender, nondistended. Normoactive bowel sounds. Extremities: Trace bilateral symmetric LE edema noted. Skin: Warm/dry, no rashes.  Assessment & Plan:   OOH arrest due to VT with 20 to 30-minute downtime New acute on chronic HFrEF with severe mitral regurgitation of unclear etiology, Cardiogenic shock now resolved.  Echo 1/23 EF < 20%. History of CHB s/p PPM, atrial flutter, HTN, HLD - Continue post-arrest care - AHF following, appreciate recommendations - Continue amiodarone, losartan, Coreg - No further melena/Hgb drift, plan to resume heparin gtt - L/RHC and TEE once more hemodynamically stable/neuro status improved  Concern for anoxic encephalopathy vs ongoing metabolic encephalopathy, ?Cefepime toxicity. CT Head NAICA. - Neuro following, appreciate recommendations - Unable to obtain MRI due to PPM incompatibility - Off of cefepime since 1/29, suspect this is washing/washed out at this point - Neuro exam significantly improved today, 1/31 with purposeful movement - Plan for extubation and weaning of sedating agents  Acute hypoxemic respiratory failure due to pulmonary edema and Serratia PNA (s/p course abx) Hypoxic ischemic encephalopathy following cardiac arrest - Tolerating vent wean 1/31AM (PSV 10/5) - Plan to extubate 1/31AM - Lasix '40mg'$  IV x 1 today per AHF for CVP 9 (given plan for extubation) - Continue supplemental O2 support - Wean O2 for sat > 90% - Pulmonary hygiene - PAD protocol for sedation: Precedex and Fentanyl for goal RASS 0 to -1; wean sedation as able - Remains on Fortaz  Hx of Anemia -  Hgb slightly down to 7.3 but no overt bleeding - S/p 2U PRBC (1/29, 1/30) - Trend H&H - Transfuse for Hgb < 8.0 (in the setting of cardiac issues) or hemodynamically significant bleeding - Monitor for signs of active bleeding with resumption of heparin gtt  Hx anxiety, depression - Hold home buspirone, lorazepam for now  Best Practice (right click and "Reselect all SmartList Selections" daily)   Diet/type: NPO; TF  DVT prophylaxis: systemic heparin on hold given Hgb drop, likely can resume today 1/31 GI prophylaxis: H2B Lines: Central line Foley:  Yes, and it is still needed Code Status:  DNR Last date of multidisciplinary goals of care discussion [1/27 updated husband at bedside]  Critical care time: 39 minutes   The patient is critically ill with multiple organ system failure and requires high complexity decision making for assessment and support, frequent evaluation and titration of therapies, advanced monitoring, review of radiographic studies and interpretation of complex data.   Critical Care Time devoted to patient care services, exclusive of separately billable procedures, described in this note is 39 minutes.  SLestine Mount PA-C [facility] 07/21/22 7:55 AM  Please see [email or web address] for pager details.  From 7A-7P if no response,  please call [phone] After hours, please call ELink [phone]

## 2022-07-22 DIAGNOSIS — Z7189 Other specified counseling: Secondary | ICD-10-CM | POA: Diagnosis not present

## 2022-07-22 DIAGNOSIS — I469 Cardiac arrest, cause unspecified: Secondary | ICD-10-CM | POA: Diagnosis not present

## 2022-07-22 DIAGNOSIS — Z515 Encounter for palliative care: Secondary | ICD-10-CM

## 2022-07-22 DIAGNOSIS — I5043 Acute on chronic combined systolic (congestive) and diastolic (congestive) heart failure: Secondary | ICD-10-CM

## 2022-07-22 LAB — CBC
HCT: 24.1 % — ABNORMAL LOW (ref 36.0–46.0)
Hemoglobin: 7.8 g/dL — ABNORMAL LOW (ref 12.0–15.0)
MCH: 32.4 pg (ref 26.0–34.0)
MCHC: 32.4 g/dL (ref 30.0–36.0)
MCV: 100 fL (ref 80.0–100.0)
Platelets: 164 10*3/uL (ref 150–400)
RBC: 2.41 MIL/uL — ABNORMAL LOW (ref 3.87–5.11)
RDW: 13.4 % (ref 11.5–15.5)
WBC: 7.9 10*3/uL (ref 4.0–10.5)
nRBC: 0 % (ref 0.0–0.2)

## 2022-07-22 LAB — COOXEMETRY PANEL
Carboxyhemoglobin: 2.2 % — ABNORMAL HIGH (ref 0.5–1.5)
Methemoglobin: <0.7 % (ref 0.0–1.5)
O2 Saturation: 79.6 %
Total hemoglobin: 8 g/dL — ABNORMAL LOW (ref 12.0–16.0)

## 2022-07-22 LAB — GLUCOSE, CAPILLARY
Glucose-Capillary: 111 mg/dL — ABNORMAL HIGH (ref 70–99)
Glucose-Capillary: 122 mg/dL — ABNORMAL HIGH (ref 70–99)
Glucose-Capillary: 126 mg/dL — ABNORMAL HIGH (ref 70–99)
Glucose-Capillary: 133 mg/dL — ABNORMAL HIGH (ref 70–99)
Glucose-Capillary: 134 mg/dL — ABNORMAL HIGH (ref 70–99)
Glucose-Capillary: 138 mg/dL — ABNORMAL HIGH (ref 70–99)

## 2022-07-22 LAB — BASIC METABOLIC PANEL
Anion gap: 6 (ref 5–15)
BUN: 24 mg/dL — ABNORMAL HIGH (ref 8–23)
CO2: 31 mmol/L (ref 22–32)
Calcium: 8.3 mg/dL — ABNORMAL LOW (ref 8.9–10.3)
Chloride: 99 mmol/L (ref 98–111)
Creatinine, Ser: 0.59 mg/dL (ref 0.44–1.00)
GFR, Estimated: >60 mL/min (ref >60)
Glucose, Bld: 122 mg/dL — ABNORMAL HIGH (ref 70–99)
Potassium: 3.9 mmol/L (ref 3.5–5.1)
Sodium: 136 mmol/L (ref 135–145)

## 2022-07-22 LAB — PHOSPHORUS: Phosphorus: 3.4 mg/dL (ref 2.5–4.6)

## 2022-07-22 LAB — MAGNESIUM: Magnesium: 2.3 mg/dL (ref 1.7–2.4)

## 2022-07-22 MED ORDER — AMIODARONE HCL 200 MG PO TABS
400.0000 mg | ORAL_TABLET | Freq: Two times a day (BID) | ORAL | Status: DC
  Administered 2022-07-22 – 2022-07-23 (×3): 400 mg
  Filled 2022-07-22 (×3): qty 2

## 2022-07-22 MED ORDER — CARVEDILOL 3.125 MG PO TABS
3.1250 mg | ORAL_TABLET | Freq: Two times a day (BID) | ORAL | Status: DC
  Administered 2022-07-23: 3.125 mg
  Filled 2022-07-22: qty 1

## 2022-07-22 MED ORDER — CLONAZEPAM 0.5 MG PO TABS
0.5000 mg | ORAL_TABLET | Freq: Three times a day (TID) | ORAL | Status: DC | PRN
  Administered 2022-07-22 – 2022-07-23 (×2): 0.5 mg
  Filled 2022-07-22 (×2): qty 1

## 2022-07-22 MED ORDER — AMIODARONE HCL 200 MG PO TABS: 200.0000 mg | ORAL_TABLET | Freq: Two times a day (BID) | ORAL | Status: DC

## 2022-07-22 MED ORDER — LOSARTAN POTASSIUM 25 MG PO TABS
12.5000 mg | ORAL_TABLET | Freq: Every day | ORAL | Status: DC
  Administered 2022-07-23: 12.5 mg
  Filled 2022-07-22: qty 1

## 2022-07-22 NOTE — Progress Notes (Signed)
Neurology Progress Note  Brief HPI: Carmen Green is a 72 y.o. female with history of complete heart block status post pacemaker, mitral valve prolapse, GERD, CHF with EF of 25% and esophagitis who initially presented after cardiac arrest with unclear downtime on 1/22.  Patient received bystander CPR and 1 shock with ROSC achieved then.  She has been intubated in the ICU since then and has developed twitching movements of bilateral upper extremities, jaw and shoulders.  She was extubated 1/31.   Subjective: Extubated   Exam: Vitals:   07/22/22 0600 07/22/22 0700  BP: (!) 90/54 (!) 99/54  Pulse: 90 90  Resp: 17 19  Temp:    SpO2: 96% 99%   Gen: Elderly female, NAD Resp: no acute distress Abd: soft, nt   NEURO:  Mental Status: Patient is in bed with eyes open but cannot follow commands and does not track examiner Cranial Nerves:  PERRL, corneal reflex intact, cough and gag intact, positive oculocephalic reflex, does not track  Motor: Moves bilateral upper extremities with antigravity strength, moves toes and feet on bilateral lower extremities spontaneously and towards painful stimuli.  Fine tremor seen in bilateral upper extremities Sensation: Moves all extremities to noxious stimuli      Imaging Reviewed: CT-scan of the brain 1/23: No acute abnormality   CT head 1/26: No acute abnormality   MRI examination of the brain: Unable to perform due to pacemaker   MWU:XLKG study showed generalized periodic discharges with triphasic morphology likely due to toxic-metabolic causes like cefepime toxicity.  Additionally there is moderate degree of encephalopathy.  No seizures or definite epileptiform discharges were seen throughout the recording   Assessment:  72 year old patient with above past medical history status postcardiac arrest with bystander CPR and ROSC achieved after unknown downtime now has developed twitching movements of the bilateral upper extremities, jaw and shoulders.  Right stem reflexes are preserved, however patient is unable to follow commands and displays only nonpurposeful movement of extremities. CT head has been performed twice and shows no acute abnormalities, MRI unable to be performed due to pacemaker. EEG shows GPD's with triphasic morphology, likely due to cefepime toxicity. Cefepime toxicity is the likely source of patient's twitching movements, would switch to alternate antibiotic and reassess in 72 hours. Cefepime from 1/25-1/29.   Impression:  Myoclonic jerking and encephalopathy in the setting of cefepime toxicity   Recommendations: - Continue fortaz - Continued evaluation  - try to minimize deliriogenic medications as much as possible (J Am Geriatr Soc. 2012 Apr;60(4):616-31): benzodiazepines, anticholinergics, diphenhydramine, antihistamines, narcotics, Ambien/Lunesta/Sonata etc.   Patient seen and examined by NP/APP with MD. MD to update note as needed.   Janine Ores, DNP, FNP-BC [facility] Pager: [phone]   NEUROHOSPITALIST ADDENDUM Performed a face to face diagnostic evaluation.   I have reviewed the contents of history and physical exam as documented by PA/ARNP/Resident and agree with above documentation.  I have discussed and formulated the above plan as documented. Edits to the note have been made as needed.  Impression/Key exam findings/Plan: extubated now, opens eyes to voice or essentially any stimulation but does not make eye contact or follow commands or communicate. With stimulation, she has increased intensity of movement in BL uppers extremities more so thatn BL lower extremities. The movements seems more writhing and moves extensors more so than flexors. Movements are semipurposeful at times. For example when pinching her proximal legs, she will move her arms towards her legs. To nares stimulation with a Qtip,  she moves her head left to right, grimaces her entire face and brings R hand closer to her  face.  Prognosis is indeterminate, needs more time to clarify. We will evaluate her next week to see how she is doing. When we examine patient's daily, we tend to miss out on small improvements in neuro exam.  Plan discussed with Dr. Tamala Julian and [place] Crane with the PCCM team  Donnetta Simpers, MD [facility] [phone]   If 7pm to 7am, please call on call as listed on AMION.

## 2022-07-22 NOTE — Progress Notes (Addendum)
Patient ID: AYAT DRENNING, female   DOB: 31-Mar-1951, 72 y.o.   MRN: [id number]     Advanced Heart Failure Rounding Note  PCP-Cardiologist: None   Subjective:    Neuro concerned for possible cefepime toxicity. Cefepime discontinued. Switched to [employer].  Extubated yesterday. Finished abx.   Still not following commands.   Co-ox 79%  CVP 6 this morning.  Scr stable  She is a-paced on amiodarone gtt.   Off heparin due to anemia. FOBT+. Hgb stable post transfusion ~8 on co-ox.   Objective:   Weight Range: 66.9 kg Body mass index is 27.87 kg/m.   Vital Signs:   Temp:  [97.6 F (36.4 C)-99 F (37.2 C)] 98.4 F (36.9 C) (02/01 0728) Pulse Rate:  [84-98] 90 (02/01 0800) Resp:  [15-30] 21 (02/01 0800) BP: (90-138)/(48-84) 104/58 (02/01 0800) SpO2:  [90 %-100 %] 99 % (02/01 0800) FiO2 (%):  [30 %] 30 % (01/31 0838) Weight:  [66.9 kg] 66.9 kg (02/01 0318) Last BM Date : 07/21/22  Weight change: Filed Weights   07/20/22 0416 07/21/22 0203 07/22/22 0318  Weight: 70.2 kg 67.2 kg 66.9 kg    Intake/Output:   Intake/Output Summary (Last 24 hours) at 07/22/2022 0837 Last data filed at 07/22/2022 0751 Gross per 24 hour  Intake 1800.3 ml  Output 3095 ml  Net -1294.7 ml      Physical Exam   CVP 6  General: extubated. No breathing difficulty. Not following commands  Neck: No JVD, no thyromegaly or thyroid nodule.  Lungs: intubated and clear CV: Nondisplaced PMI.  Heart regular S1/S2, no S3/S4,1/6 HSM apex.  No peripheral edema.   Abdomen: Soft, nontender, no hepatosplenomegaly, no distention.  Skin: Intact without lesions or rashes.  Neurologic: not following commands  Extremities: No clubbing or cyanosis.  HEENT: Normal.    Telemetry   A-V sequential pacing 90s (personally reviewed)  EKG    N/A  Labs    CBC Recent Labs    07/21/22 0859 07/21/22 2332  WBC 10.1 7.9  HGB 7.9* 7.8*  HCT 23.5* 24.1*  MCV 100.4* 100.0  PLT 162 431   Basic Metabolic  Panel Recent Labs    07/20/22 0405 07/21/22 0407 07/22/22 0303  NA 140 139 136  K 3.9 4.0 3.9  CL 108 105 99  CO2 '26 26 31  '$ GLUCOSE 162* 145* 122*  BUN 30* 23 24*  CREATININE 0.73 0.63 0.59  CALCIUM 8.8* 8.5* 8.3*  MG 2.2  --  2.3  PHOS  --   --  3.4   Liver Function Tests No results for input(s): "AST", "ALT", "ALKPHOS", "BILITOT", "PROT", "ALBUMIN" in the last 72 hours.   BNP: BNP (last 3 results) Recent Labs    07/12/22 0057  BNP 547.6*    ProBNP (last 3 results) Recent Labs    06/28/22 1040  PROBNP 3,230*   Fasting Lipid Panel No results for input(s): "CHOL", "HDL", "LDLCALC", "TRIG", "CHOLHDL", "LDLDIRECT" in the last 72 hours.  Imaging    DG Abd Portable 1V  Result Date: 07/21/2022 CLINICAL DATA:  [id number] Encounter for feeding tube placement [id number] EXAM: PORTABLE ABDOMEN - 1 VIEW COMPARISON:  07/13/2022 FINDINGS: Feeding tube tip overlies the gastric body. Previous orogastric tube is been removed. Nonobstructive bowel gas pattern. Unchanged cardiomegaly. Left basilar opacities, similar prior chest radiograph with trace effusions. IMPRESSION: Feeding tube tip overlies the stomach. Electronically Signed   By: Maurine Simmering M.D.   On: 07/21/2022 12:47     Medications:  Scheduled Medications:  sodium chloride   Intravenous Once   aspirin  81 mg Per Tube Daily   carvedilol  3.125 mg Oral BID WC   Chlorhexidine Gluconate Cloth  6 each Topical Daily   feeding supplement (PROSource TF20)  60 mL Per Tube Daily   fiber supplement (BANATROL TF)  60 mL Per Tube BID   losartan  12.5 mg Oral Daily   pantoprazole (PROTONIX) IV  40 mg Intravenous Q12H   spironolactone  25 mg Per Tube Daily    Infusions:  sodium chloride 10 mL/hr at 07/22/22 0751   amiodarone 30 mg/hr (07/22/22 0751)   dexmedetomidine (PRECEDEX) IV infusion 0.6 mcg/kg/hr (07/22/22 0751)   feeding supplement (VITAL 1.5 CAL) 45 mL/hr at 07/22/22 0751    PRN Medications: sodium chloride,  acetaminophen **OR** acetaminophen (TYLENOL) oral liquid 160 mg/5 mL **OR** acetaminophen, ondansetron (ZOFRAN) IV    Patient Profile   72 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  Assessment/Plan   1. PEA Arrest--> V Fib--> shock: Witnessed arrest. CPR/EPI/Shock x2 with ROSC.  Unclear etiology/ Suspect ischemic event.  TSH ok. HS Trop 463-->753.  - LHC if she has meaningful neurologic recovery.  - Currently on amiodarone gtt.  2. Neuro: Post-arrest, she responds to painful stimuli but nothing purposeful at this point.  She remains on Precedex.  Head CT with nothing acute.  - EEG c/w moderate degree of encephalopathy.  No seizures  - neurology following. Concerned for possible cefepime toxicity. Cefepime discontinued. Switched to [employer].  - needs 72h washout for cefepime before we can consider exam for prognostication per neuro. We are approaching 72 hrs w/ no significant improvement. Still not following commands.  3. Acute Hypoxic Respiratory Failure: With aspiration PNA.  Now extubated 4. Acute/Chronic HFrEF: Echo this admission with EF < 20%, RV low normal, ?moderate MR. Cause uncertain.  Cannot rule out CAD.  Also with possible severe MR on a prior echo and has been chronically RV paced in setting of CHB. Co-ox 79%. CVP 6 today.  - Continue spiro 25 mg daily - Continue losartan 12.5 mg  - Continue Coreg 3.125 mg bid.  - Will need LHC/RHC if she has neurological recovery.  - Avoid SGLT2i for now with cystitis 2 occasions last year.  5. Atrial flutter: Paroxysmal.  She is now a-paced.  - Continue amio drip.  - Off heparin due to anemia 6.  PVCs: Frequent PVCs.  - Continue amio drip.  7. MR: Severe on echo prior to admission, echo here was ?moderate.  - If she has neurologic recovery, will need TEE.  8.  H/O CHB--> St Jude PPM: Gen changed 2004, 2012.  Abandoned R side device. L sided  device implanted 2021.  9. Aspiration PNA: Tracheal aspirate + serratia. BCx NGTD  - initially on cefepime. Neurology concerned for possible cefepime toxicity. Cefepime discontinued and switched to [place]. Abx course completed 1/31 10. Anemia: Hgb 6.6 => 7.5=>transfused 1uRBC=>7.8=>7.3=>8.3=>8.0. No overt bleeding. FOBT+  - will hold off on GI consultation for now until we see if any meaningful neurological recovery 11.  DNR  - needs 72h washout for cefepime before we can consider exam for prognostication per neuro. We are approaching 72 hrs w/ no significant improvement. Still not following commands. Recommend palliative care consultation   Nelida Gores 07/22/2022 8:37 AM  Patient seen with PA, agree with the above note.  She was extubated yesterday, on nasal cannula.  Getting pills via Cortrack.   This is her 3rd day off cefepime.  She shakes her head side to side but is not responsive and has no purposeful activity that can be detected.   CVP 6, co-ox 80%.    General: NAD Neck: No JVD, no thyromegaly or thyroid nodule.  Lungs: Clear to auscultation bilaterally with normal respiratory effort. CV: Nondisplaced PMI.  Heart regular S1/S2, no S3/S4, 1/6 HSM apex.  No peripheral edema.   Abdomen: Soft, nontender, no hepatosplenomegaly, no distention.  Skin: Intact without lesions or rashes.  Neurologic: No purposeful activity.  Extremities: No clubbing or cyanosis.  HEENT: Normal.   I am concerned that there is anoxic injury, not just cefepime neurotoxicity (today is 3rd day off cefepime). No purposeful response.  Neurology following.   She remains a-paced.  Has had only 1 episode of atrial flutter that was this admission and transient. FOBT+ and anemic, heparin has been stopped. Hgb 8 today, will leave off heparin gtt for now.  Can stop amiodarone gtt and give amiodarone per NGT.   Volume status and co-ox look ok. Continue GDMT via Cortrack, would not titrate with SBP  90s-100s.  If she has neurological recovery, needs cath and TEE as above.   She has completed course of ceftazidime for Serratia in sputum.   Loralie Champagne 07/22/2022 10:11 AM

## 2022-07-22 NOTE — Progress Notes (Signed)
NAME:  Carmen Green, MRN:  [id number], DOB:  [birth year], LOS: 42 ADMISSION DATE:  07/12/2022, CONSULTATION DATE: 07/12/2022 REFERRING MD: Darl Householder - EDP, CHIEF COMPLAINT: Cardiac arrest  History of Present Illness:  72 year old woman who [facility] 1/22 after OOH cardiac arrest. PMHx significant for complete heart block (s/p pacemaker placement), mitral valve prolapse, GERD and esophagitis.  Patient recently been undergoing additional cardiac workup after presenting to the Cardiology office 1/8 with new complaints of SOB and fatigue.  Echo 1/16 demonstrated newly reduced LVEF to 25% with severe MR.  There were plans for her to undergo TEE and L/RHC; however, she unfortunately suffered cardiac arrest at home at approximately 1830 1/22.  Bystanders report she fell to the ground.  Sounds like she did receive bystander CPR and first responders placed AED which did not advise shock.  After EMS arrived and placed cardiac monitor, patient was in PEA.  Rhythm did eventually changed to V-fib at which point she was defibrillated and ROSC was achieved.  King airway was exchanged for ETT in ED.  Patient was hypotensive requiring norepinephrine infusion.  EKG with nonspecific possibly ischemic changes.  Cardiology was consulted by the EDP.    PCCM consulted for ICU admission.  Pertinent Medical History:   Past Medical History:  Diagnosis Date   Allergy    Anemia    Anxiety    Arthritis    Cataract    bil cataracts removed   Colon polyp    Complete heart block ([place])    Contusion of chest wall 10/10/2007   Centricity Description: CHEST WALL CONTUSION Qualifier: Diagnosis of  By: Aline Brochure MD, Dorothyann Peng   Centricity Description: CONTUSION, RIGHT RIB Qualifier: Diagnosis of  By: Aline Brochure MD, Stanley     Depression    Diverticulitis    Esophagitis    ESOPHAGITIS 06/25/2008   Qualifier: History of  By: Harlon Ditty CMA (AAMA), Dottie     GERD (gastroesophageal reflux disease)    H/O seasonal allergies     Heart murmur    Hiatal hernia    Hyperlipidemia    s/p PPM implant by Dr Olevia Perches 1992 with generator change 2004 ([facility]), she is device dependant   Hypertension    Mitral valve prolapse    Pacemaker    Significant Hospital Events: Including procedures, antibiotic start and stop dates in addition to other pertinent events   1/23 Admission following cardiac arrest; EP consulted for runs of ventricular ectopy and turned pacer up to 90 with significant improvement; Echo LVEF <20% w/ severe MR 1/24 off levo; WBC trending up and tmax 99.3, cooling per TTM protocol; cultures sent and started on broad spectrum abx 1/25 serratia in trach cx> ctx> cefepime, weaning but mental status remains slow to improve 1/27 more awake but not consistently following commands.  Started on low-dose Precedex for tube tolerance. 1/31 Improving neuro exam, +purposeful movement.Weaning well on PSV 10/5, extubated. Tolerated well; not following commands. Intermittent agitation requiring Precedex. 2/1 Neuro exam grossly unchanged; more agitated; will occasionally track/make eye contact, not following commands. Indeterminate neuroprognostics per Neuro.  Interim History / Subjective:  No significant events overnight Neuro exam grossly unchanged More agitated intermittently; occasional tracking/eye contact Not following commands Per Neuro, neuroprognostics indeterminate.  Objective:  Blood pressure (!) 104/58, pulse 90, temperature 98.4 F (36.9 C), temperature source Axillary, resp. rate (!) 21, height '5\' 1"'$  (1.549 m), weight 66.9 kg, SpO2 99 %. CVP:  [2 mmHg-7 mmHg] 7 mmHg  FiO2 (%):  [30 %] 30 %  Intake/Output Summary (Last 24 hours) at 07/22/2022 0819 Last data filed at 07/22/2022 0751 Gross per 24 hour  Intake 1800.3 ml  Output 3095 ml  Net -1294.7 ml    Filed Weights   07/20/22 0416 07/21/22 0203 07/22/22 0318  Weight: 70.2 kg 67.2 kg 66.9 kg   Physical Examination: General: Acutely ill-appearing elderly  woman in NAD. HEENT: [place]/AT, anicteric sclera, PERRL, dry mucous membranes. Neuro:  Awake, aphasic, unable to assess orientation.  Withdraws to pain in all 4 extremities. Does not respond to stimuli, will shake head left to right with stimulation. Not following commands. Moves all 4 extremities spontaneously. Generalized deconditioning. +Cough and +Gag  CV: RRR, no m/g/r. PULM: Breathing even and unlabored on 3LNC. Lung fields CTAB. GI: Soft, nontender, nondistended. Normoactive bowel sounds. Extremities: No significant LE edema noted. Skin: Warm/dry, no rashes.  Assessment & Plan:   OOH arrest due to VT with 20 to 30-minute downtime New acute on chronic HFrEF with severe mitral regurgitation of unclear etiology, Cardiogenic shock now resolved.  Echo 1/23 EF < 20%. History of CHB s/p PPM, atrial flutter, HTN, HLD - Continue post-arrest care - AHF following, appreciate recommendations - Continue amiodarone, losartan, Coreg - No further melena/Hgb drift, plan for heparin gtt resumption - L/RHC and TEE once more hemodynamically stable/neuro status improved  Concern for anoxic encephalopathy vs ongoing metabolic encephalopathy, ?Cefepime toxicity. CT Head NAICA. - Neuro following, appreciate recommendations - Unable to obtain MRI due to PPM incompatibility - Off of cefepime since 1/29 - Neuro exam unchanged today 2/1, remains intermittently agitated, some purposeful movement - Neuro plan to reevaluate on a weekly basis, needs time  Acute hypoxemic respiratory failure due to pulmonary edema and Serratia PNA (s/p course abx) Hypoxic ischemic encephalopathy following cardiac arrest - Extubated 1/31AM to [place] - Tolerated well from a respiratory standpoint - Continue supplemental O2 support - Wean O2 for sat > 90% - Pulmonary hygiene - PAD protocol for sedation: Fentanyl PRN for goal RASS 0 to -1 - Continue Fortaz  Hx of Anemia - Hgb slightly down to 7.3 but no overt bleeding. S/p 2U PRBC  (1/29, 1/30) - Trend H&H - Monitor for signs of active bleeding - Transfuse for Hgb < 8.0 (in the setting of cardiac issues) or hemodynamically significant bleeding  Hx anxiety, depression - Hold home buspirone, lorazepam for now to allow clearance of mental status; resume PRN  GOC - DNR code status - PMT consulted for assistance  Best Practice (right click and "Reselect all SmartList Selections" daily)   Diet/type: NPO; TF  DVT prophylaxis: systemic heparin on hold given Hgb drop, likely can resume today 2/1 GI prophylaxis: H2B Lines: Central line Foley:  Yes, and it is still needed Code Status:  DNR Last date of multidisciplinary goals of care discussion [2/1 husband updated by Dr. Tamala Julian via phone]  Critical care time: 40 minutes   The patient is critically ill with multiple organ system failure and requires high complexity decision making for assessment and support, frequent evaluation and titration of therapies, advanced monitoring, review of radiographic studies and interpretation of complex data.   Critical Care Time devoted to patient care services, exclusive of separately billable procedures, described in this note is 40 minutes.  Lestine Mount, PA-C [facility] 07/22/22 8:19 AM  Please see [email or web address] for pager details.  From 7A-7P if no response, please call [phone] After hours, please call ELink [phone]

## 2022-07-22 NOTE — Consult Note (Signed)
Palliative Medicine Inpatient Consult Note  Consulting Provider: Candee Furbish, MD   Reason for consult:   [place] Palliative Medicine Consult  Reason for Consult? [facility] in grey zone for prognosticating, help family with decisions should she languish, non urgent   07/22/2022  HPI:  Per intake H&P --> 72 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   Palliative care has been asked to get involved in the setting of prolonged hospitalization status post PEA arrest for further goals of care discussions  Clinical Assessment/Goals of Care:  *Please note that this is a verbal dictation therefore any spelling or grammatical errors are due to the "[employer]" system interpretation.  I have reviewed medical records including EPIC notes, labs and imaging, received report from bedside RN, assessed the patient.    I called patients spouse, Gwyndolyn Saxon to further discuss diagnosis prognosis, GOC, EOL wishes, disposition and options.   I introduced Palliative Medicine as specialized medical care for people living with serious illness. It focuses on providing relief from the symptoms and stress of a serious illness. The goal is to improve quality of life for both the patient and the family.  Medical History Review and Understanding:  Discussed friend's past medical history significant for anemia, anxiety, depression, GERD, hypertension, mitral valve prolapse, heart failure  Social History:  Sonda originally originally from [place].  She and her husband have been married for the past 24 years though been together for 81 years.  Her husband shares that these have been wonderful years.  They have 1 daughter who is 64 who has some developmental disabilities.  Brookelynn worked in a [employer] office early in her career and then for her mother and [employer].  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  Gwyndolyn Saxon shares that he and Jim enjoyed going to [employer] in [place].  She is a woman of faith and practices within the [facility] denomination.  Functional and Nutritional State:  Prior to hospitalization Nishka was identified to have been very active.  Her husband shares that she was able to do everything for herself and even helped with care of him as he has previously suffered a stroke and has some visual impairments.  She was eating and drinking regularly preceding admission  Advance Directives:  A detailed discussion was had today regarding advanced directives.  Patient's advanced directives are on file  Code Status:.  Concepts specific to code status, artifical feeding and hydration, continued IV antibiotics and rehospitalization was had.  The difference between a aggressive medical intervention path  and a palliative comfort care path for this patient at this time was had.   Per patient's spouse she is a DO NOT RESUSCITATE CODE STATUS and would not want prolonged measures to keep her alive artificially.  Discussion:  Berneta's husband and I discussed her 9-day hospitalization through progress note review.  We discussed the concern for possible anoxic brain injury in the setting of having suffered a cardiac arrest.  We discussed how patients can be confused and delirious after such events.  We reviewed perhaps patient's medication-cefepime which had been used in the setting of pneumonia exacerbated her encephalopathic state.  We reviewed that this has been held for the past 3 days.  Discussed patient's tenuous state and how she might improve but  she also might worsen.  I shared best case and worst-case scenarios.  Gwyndolyn Saxon is clear that his wife would not want to live dependent on other people nor would she want to live dependent on artificial means of support.  We  discussed the idea of meeting altogether with patient's daughter and sisters for further determinations.  Presently plan to allow additional time for outcomes.  Patient's spouse curious as to when a heart catheter will occur.  Per note review it appears this would happen when patient is more stable mentally.  Discussed the importance of continued conversation with family and their  medical providers regarding overall plan of care and treatment options, ensuring decisions are within the context of the patients values and GOCs.  Decision Maker: [place], [place] (Spouse) [phone] ([place])   SUMMARY OF RECOMMENDATIONS   DNAR   Allowing additional time for outcomes  Open and honest conversations held in the setting of patient's current clinical state  Plan to meet with patient's family in the oncoming days  Ongoing palliative medicine team support  Code Status/Advance Care Planning: DNAR   Palliative Prophylaxis:  Aspiration, Bowel Regimen, Delirium Protocol, Frequent Pain Assessment, Oral Care, Palliative Wound Care, and Turn Reposition  Additional Recommendations (Limitations, Scope, Preferences): Continue current care  Psycho-social/Spiritual:  Desire for further Chaplaincy support: Yes Additional Recommendations: Education on acute on chronic disease burden.  Education on postarrest planning, anticipation   Prognosis: Unclear at this time per neurology allow additional time for further prognostication  Discharge Planning: Discharge plan is uncertain  Vitals:   44/81/85 1100 07/22/22 1141  BP: 101/72   Pulse: 91   Resp: (!) 26   Temp:  98.4 F (36.9 C)  SpO2: 92%     Intake/Output Summary (Last 24 hours) at 07/22/2022 1142 Last data filed at 07/22/2022 1100 Gross per 24 hour  Intake 1515.57 ml  Output 2555 ml  Net -1039.43 ml   Last Weight  Most recent update: 07/22/2022  3:20 AM    Weight  66.9 kg (147 lb 7.8 oz)            Gen: Frail elderly Caucasian  female HEENT: [place], moist mucous membranes CV: Regular rate and rhythm  PULM:  On RA, breathing is even and nonlabored ABD: soft/nontender  EXT: No edema, moving around Neuro: Disoriented - not tracking or following commands  PPS: 10%   This conversation/these recommendations were discussed with patient primary care team, Dr. Tamala Julian  Billing based on MDM: High ____________________________________________ [place] Team Team Cell Phone: [phone] Please utilize secure chat with additional questions, if there is no response within 30 minutes please call the above phone number  Palliative Medicine Team providers are available by phone from 7am to 7pm daily and can be reached through the team cell phone.  Should this patient require assistance outside of these hours, please call the patient's attending physician.

## 2022-07-22 NOTE — Evaluation (Signed)
Physical Therapy Evaluation Patient Details Name: Carmen Green MRN: [id number] DOB: 06/04/1951 Today's Date: 07/22/2022  History of Present Illness  Pt is 72 year old presented to [facility] on  07/12/22 with cardiac arrest. Pt in ED just after midnight on 1/22 with SOB and given lasix and improved and went home. Seen in EP clinic after return home without c/o's. Suffered witnessed cardiac arrest at home. Pt intubated 1/22 and extubated 1/31. Pt with noted twitching movements of UE's, shoulders and jaw. Neuro consulted - cefepime toxicity. After extubation not following commands.  PMH - pacer, htn, severe MR diagnosed 1/16.  Clinical Impression  Pt presents to PT awake but not following any commands. Moves extremities spontaneously. Dependent with all mobility and no righting reactions noted. Will follow for a couple of weeks to see if pt able to participate. Will need some post acute rehab vs custodial long term care depending on neuro outcome.        Recommendations for follow up therapy are one component of a multi-disciplinary discharge planning process, led by the attending physician.  Recommendations may be updated based on patient status, additional functional criteria and insurance authorization.  Follow Up Recommendations Other (comment) (To be determined)      Assistance Recommended at Discharge Frequent or constant Supervision/Assistance  Patient can return home with the following  Two people to help with walking and/or transfers;Two people to help with bathing/dressing/bathroom;Help with stairs or ramp for entrance;Assist for transportation;Direct supervision/assist for financial management;Direct supervision/assist for medications management;Assistance with feeding;Assistance with cooking/housework    Equipment Recommendations Other (comment) (TBD)  Recommendations for Other Services       Functional Status Assessment Patient has had a recent decline in their functional status  and/or demonstrates limited ability to make significant improvements in function in a reasonable and predictable amount of time     Precautions / Restrictions Precautions Precautions: Fall      Mobility  Bed Mobility Overal bed mobility: Needs Assistance Bed Mobility: Supine to Sit, Sit to Supine     Supine to sit: +2 for physical assistance, Total assist, HOB elevated Sit to supine: +2 for physical assistance, Total assist   General bed mobility comments: Assist for all aspect    Transfers                        Ambulation/Gait                  Stairs            Wheelchair Mobility    Modified Rankin (Stroke Patients Only)       Balance Overall balance assessment: Needs assistance Sitting-balance support: Bilateral upper extremity supported, Feet supported Sitting balance-Leahy Scale: Zero Sitting balance - Comments: total assist to maintain static sitting EOB                                     Pertinent Vitals/Pain Pain Assessment Facial Expression: Relaxed, neutral Body Movements: Restlessness Muscle Tension: Relaxed Compliance with ventilator (intubated pts.): N/A Vocalization (extubated pts.): Talking in normal tone or no sound CPOT Total: 2    Home Living Family/patient expects to be discharged to:: Private residence Living Arrangements: Spouse/significant other                      Prior Function Prior Level of Function : Independent/Modified Independent  Mobility Comments: Per chart pt ambulatory without difficulty and active       Hand Dominance        Extremity/Trunk Assessment   Upper Extremity Assessment Upper Extremity Assessment: Difficult to assess due to impaired cognition (spontaneous movement noted. ROM WFL)    Lower Extremity Assessment Lower Extremity Assessment: Difficult to assess due to impaired cognition (Spontaneous movement noted. ROM WFL)        Communication   Communication:  (nonverbal)  Cognition Arousal/Alertness: Awake/alert                                     General Comments: Pt with eyes open and moving extremities spontaneously. Does not follow commands or track/make eye contact. No righting reactions in sitting.        General Comments General comments (skin integrity, edema, etc.): VSS on RA    Exercises     Assessment/Plan    PT Assessment Patient needs continued PT services  PT Problem List Decreased strength;Decreased balance;Decreased mobility;Decreased cognition;Decreased activity tolerance       PT Treatment Interventions Functional mobility training;Therapeutic activities;Therapeutic exercise;Cognitive remediation;Patient/family education;Neuromuscular re-education;Balance training    PT Goals (Current goals can be found in the Care Plan section)  Acute Rehab PT Goals Patient Stated Goal: Unable PT Goal Formulation: Patient unable to participate in goal setting Time For Goal Achievement: 08/05/22 Potential to Achieve Goals: Fair Additional Goals Additional Goal #1: Pt will follow 10% of 1 step commands with multimodal cues    Frequency Min 2X/week     Co-evaluation               AM-PAC PT "6 Clicks" Mobility  Outcome Measure Help needed turning from your back to your side while in a flat bed without using bedrails?: Total Help needed moving from lying on your back to sitting on the side of a flat bed without using bedrails?: Total Help needed moving to and from a bed to a chair (including a wheelchair)?: Total Help needed standing up from a chair using your arms (e.g., wheelchair or bedside chair)?: Total Help needed to walk in hospital room?: Total Help needed climbing 3-5 steps with a railing? : Total 6 Click Score: 6    End of Session   Activity Tolerance: Other (comment) (limited by cognition) Patient left: in bed;with call bell/phone within reach;with bed  alarm set Nurse Communication: Mobility status PT Visit Diagnosis: Other abnormalities of gait and mobility (R26.89);Other symptoms and signs involving the nervous system (R29.898)    Time: 6440-3474 PT Time Calculation (min) (ACUTE ONLY): 13 min   Charges:   PT Evaluation $PT Eval Moderate Complexity: [place] [facility] [place] 07/22/2022, 1:30 PM

## 2022-07-23 ENCOUNTER — Inpatient Hospital Stay (HOSPITAL_COMMUNITY): Payer: Medicare Other

## 2022-07-23 ENCOUNTER — Encounter (HOSPITAL_COMMUNITY): Admission: RE | Payer: Self-pay | Source: Home / Self Care

## 2022-07-23 ENCOUNTER — Ambulatory Visit (HOSPITAL_COMMUNITY): Admission: RE | Admit: 2022-07-23 | Payer: Medicare Other | Source: Home / Self Care | Admitting: Internal Medicine

## 2022-07-23 DIAGNOSIS — I059 Rheumatic mitral valve disease, unspecified: Secondary | ICD-10-CM

## 2022-07-23 DIAGNOSIS — I469 Cardiac arrest, cause unspecified: Secondary | ICD-10-CM | POA: Diagnosis not present

## 2022-07-23 DIAGNOSIS — Z515 Encounter for palliative care: Secondary | ICD-10-CM | POA: Diagnosis not present

## 2022-07-23 DIAGNOSIS — Z7189 Other specified counseling: Secondary | ICD-10-CM | POA: Diagnosis not present

## 2022-07-23 LAB — BASIC METABOLIC PANEL
Anion gap: 8 (ref 5–15)
BUN: 24 mg/dL — ABNORMAL HIGH (ref 8–23)
CO2: 26 mmol/L (ref 22–32)
Calcium: 8.5 mg/dL — ABNORMAL LOW (ref 8.9–10.3)
Chloride: 103 mmol/L (ref 98–111)
Creatinine, Ser: 0.66 mg/dL (ref 0.44–1.00)
GFR, Estimated: >60 mL/min (ref >60)
Glucose, Bld: 137 mg/dL — ABNORMAL HIGH (ref 70–99)
Potassium: 4 mmol/L (ref 3.5–5.1)
Sodium: 137 mmol/L (ref 135–145)

## 2022-07-23 LAB — CBC
HCT: 26.7 % — ABNORMAL LOW (ref 36.0–46.0)
Hemoglobin: 8.6 g/dL — ABNORMAL LOW (ref 12.0–15.0)
MCH: 32.6 pg (ref 26.0–34.0)
MCHC: 32.2 g/dL (ref 30.0–36.0)
MCV: 101.1 fL — ABNORMAL HIGH (ref 80.0–100.0)
Platelets: 242 10*3/uL (ref 150–400)
RBC: 2.64 MIL/uL — ABNORMAL LOW (ref 3.87–5.11)
RDW: 13.5 % (ref 11.5–15.5)
WBC: 9.9 10*3/uL (ref 4.0–10.5)
nRBC: 0 % (ref 0.0–0.2)

## 2022-07-23 LAB — GLUCOSE, CAPILLARY
Glucose-Capillary: 128 mg/dL — ABNORMAL HIGH (ref 70–99)
Glucose-Capillary: 140 mg/dL — ABNORMAL HIGH (ref 70–99)
Glucose-Capillary: 121 mg/dL — ABNORMAL HIGH (ref 70–99)

## 2022-07-23 LAB — COOXEMETRY PANEL
Carboxyhemoglobin: 2.3 % — ABNORMAL HIGH (ref 0.5–1.5)
Methemoglobin: <0.7 % (ref 0.0–1.5)
O2 Saturation: 96.6 %
Total hemoglobin: 9.1 g/dL — ABNORMAL LOW (ref 12.0–16.0)

## 2022-07-23 LAB — MAGNESIUM: Magnesium: 2.3 mg/dL (ref 1.7–2.4)

## 2022-07-23 SURGERY — ECHOCARDIOGRAM, TRANSESOPHAGEAL
Anesthesia: Monitor Anesthesia Care

## 2022-07-23 MED ORDER — HALOPERIDOL LACTATE 5 MG/ML IJ SOLN
0.5000 mg | INTRAMUSCULAR | Status: DC | PRN
  Administered 2022-07-23: 0.5 mg via INTRAVENOUS
  Filled 2022-07-23: qty 1

## 2022-07-23 MED ORDER — HYDRALAZINE HCL 20 MG/ML IJ SOLN
10.0000 mg | INTRAMUSCULAR | Status: DC | PRN
  Administered 2022-07-23: 20 mg via INTRAVENOUS
  Filled 2022-07-23: qty 1

## 2022-07-23 MED ORDER — LORAZEPAM 2 MG/ML IJ SOLN
1.0000 mg | Freq: Four times a day (QID) | INTRAMUSCULAR | Status: DC
  Administered 2022-07-23 (×2): 1 mg via INTRAVENOUS
  Filled 2022-07-23 (×2): qty 1

## 2022-07-23 MED ORDER — GLYCOPYRROLATE 0.2 MG/ML IJ SOLN: 0.2000 mg | INTRAMUSCULAR | Status: DC | PRN

## 2022-07-23 MED ORDER — HALOPERIDOL 0.5 MG PO TABS: 0.5000 mg | ORAL_TABLET | ORAL | Status: DC | PRN

## 2022-07-23 MED ORDER — OLANZAPINE 10 MG IM SOLR
2.5000 mg | Freq: Once | INTRAMUSCULAR | Status: AC | PRN
  Administered 2022-07-23: 2.5 mg via INTRAMUSCULAR
  Filled 2022-07-23: qty 10

## 2022-07-23 MED ORDER — BIOTENE DRY MOUTH MT LIQD: 15.0000 mL | OROMUCOSAL | Status: DC | PRN

## 2022-07-23 MED ORDER — MORPHINE SULFATE (PF) 2 MG/ML IV SOLN
1.0000 mg | Freq: Four times a day (QID) | INTRAVENOUS | Status: DC
  Administered 2022-07-23 (×2): 1 mg via INTRAVENOUS
  Filled 2022-07-23 (×2): qty 1

## 2022-07-23 MED ORDER — POLYVINYL ALCOHOL 1.4 % OP SOLN: 1.0000 [drp] | Freq: Four times a day (QID) | OPHTHALMIC | Status: DC | PRN

## 2022-07-23 MED ORDER — LORAZEPAM 2 MG/ML IJ SOLN: 0.5000 mg | INTRAMUSCULAR | Status: DC | PRN

## 2022-07-23 MED ORDER — ACETAMINOPHEN 650 MG RE SUPP: 650.0000 mg | Freq: Four times a day (QID) | RECTAL | Status: DC | PRN

## 2022-07-23 MED ORDER — GUAIFENESIN 100 MG/5ML PO LIQD
5.0000 mL | ORAL | Status: DC | PRN
  Administered 2022-07-23: 5 mL
  Filled 2022-07-23: qty 5

## 2022-07-23 MED ORDER — HALOPERIDOL LACTATE 2 MG/ML PO CONC: 0.5000 mg | ORAL | Status: DC | PRN

## 2022-07-23 MED ORDER — OXYCODONE HCL 5 MG PO TABS
5.0000 mg | ORAL_TABLET | ORAL | Status: DC | PRN
  Administered 2022-07-23 (×2): 5 mg
  Filled 2022-07-23 (×2): qty 1

## 2022-07-23 MED ORDER — GLYCOPYRROLATE 1 MG PO TABS: 1.0000 mg | ORAL_TABLET | ORAL | Status: DC | PRN

## 2022-07-23 MED ORDER — LOSARTAN POTASSIUM 25 MG PO TABS: 25.0000 mg | ORAL_TABLET | Freq: Every day | ORAL | Status: DC

## 2022-07-23 MED ORDER — ACETAMINOPHEN 325 MG PO TABS: 650.0000 mg | ORAL_TABLET | Freq: Four times a day (QID) | ORAL | Status: DC | PRN

## 2022-07-23 MED ORDER — MORPHINE SULFATE (PF) 2 MG/ML IV SOLN: 1.0000 mg | INTRAVENOUS | Status: DC | PRN

## 2022-07-23 NOTE — Discharge Summary (Signed)
Physician Discharge Summary  Carmen Green BTY:[id number] DOB: 1950/12/19 DOA: 07/12/2022  PCP: [name], DO  Admit date: 07/12/2022 Discharge date: 07/23/2022  Admitted From: Home Disposition: Inpatient hospice   Discharge Condition: Critical CODE STATUS: Comfort care Diet recommendation: Comfort feeding  Discharge summary: 72 year old female with multiple medical issues who was recently undergoing cardiac workup for new onset of shortness of breath and fatigue, found to have systolic heart failure with left ventricular ejection fraction 25% with severe MR unfortunately had a cardiac arrest at home on 1/22.  EMS found with PEA arrest.  Rhythm changed to V-fib, she was defibrillated and ROSC was achieved.  Patient was admitted to intensive care unit postcardiac arrest.  Followed by critical care, neurology and cardiology.  Patient remained persistently encephalopathic.  Ultimate decision was made to serve her with hospice.  Significant events   1/23 Admission following cardiac arrest; EP consulted for runs of ventricular ectopy and turned pacer up to 90 with significant improvement; Echo LVEF <20% w/ severe MR 1/27 more awake but not consistently following commands.  Started on low-dose Precedex for tube tolerance. 1/31 extubated to nasal cannula oxygen.  Nonpurposeful movements.  Intermittent agitation requiring Precedex. 2/1 Neuro exam grossly unchanged; more agitated; will occasionally track/make eye contact, not following commands. Indeterminate neuroprognostics per Neuro.  Cardiac arrest with V. 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 Concern for anoxic encephalopathy vs ongoing metabolic encephalopathy,  suspected Cefepime toxicity. CT Head NAICA.  Due to patient's persistent  encephalopathy, anoxic brain injury, severe underlying medical issues and poor chances of recovery shared decision making was done with family and she was treated with comfort care and hospice for end-of-life care.  Currently remains comfortable and medicated.  She will be transferred to inpatient hospice today to [place] to continue to provide end-of-life care. Patient unable to take any medication by mouth. If hospice facility needs, will leave IV cannula in place. Patient will be medicated for comfort before transfer.   Discharge Diagnoses:  Principal Problem:   Cardiac arrest [facility]) Active Problems:   Cardiogenic shock ([place])   Acute on chronic HFrEF (heart failure with reduced ejection fraction) (HCC)   Acute respiratory failure with hypoxia (HCC)   Encephalopathy acute   On mechanically assisted ventilation (HCC)   AKI (acute kidney injury) [facility])    Discharge Instructions  Discharge Instructions     No wound care   Complete by: As directed       Allergies as of 07/23/2022       Reactions   Zoloft [sertraline Hcl] Nausea Only      Amoxapine And Related Rash   Cephalexin Rash   Oxycodone    "Loopy"   Penicillins Rash        Medication List     STOP taking these medications    buPROPion 300 MG 24 hr tablet Commonly known as: WELLBUTRIN XL   busPIRone 15 MG tablet Commonly known as: BUSPAR   cholecalciferol 25 MCG (1000 UNIT) tablet Commonly known as: VITAMIN D3   D-MANNOSE PO   esomeprazole 40 MG capsule Commonly known as: NEXIUM   estradiol 0.1 MG/GM vaginal cream Commonly known as: ESTRACE   fluticasone 50 MCG/ACT nasal spray Commonly known as: FLONASE   furosemide 20 MG tablet Commonly known as: LASIX   guaiFENesin 600 MG 12  hr tablet Commonly known as: MUCINEX   levocetirizine 5 MG tablet Commonly known as: XYZAL   LORazepam 1 MG tablet Commonly known as: ATIVAN   losartan 25 MG tablet Commonly known as: COZAAR   meclizine 25  MG tablet Commonly known as: ANTIVERT   medroxyPROGESTERone 2.5 MG tablet Commonly known as: PROVERA   methenamine 1 g tablet Commonly known as: HIPREX   metoprolol succinate 25 MG 24 hr tablet Commonly known as: Toprol XL   mirtazapine 7.5 MG tablet Commonly known as: REMERON   polyethylene glycol powder 17 GM/SCOOP powder Commonly known as: GLYCOLAX/MIRALAX   rosuvastatin 10 MG tablet Commonly known as: CRESTOR   spironolactone 25 MG tablet Commonly known as: ALDACTONE   trolamine salicylate 10 % cream Commonly known as: ASPERCREME   vitamin C 1000 MG tablet   ZINC 15 PO        Allergies  Allergen Reactions   Zoloft [Sertraline Hcl] Nausea Only        Amoxapine And Related Rash   Cephalexin Rash   Oxycodone     "Loopy"   Penicillins Rash    Consultations: Cardiology Neurology Critical care   Procedures/Studies: DG CHEST PORT 1 VIEW  Result Date: 07/23/2022 CLINICAL DATA:  Dyspnea, respiratory abnormalities EXAM: PORTABLE CHEST 1 VIEW COMPARISON:  Portable exam 2683 hours compared to 07/13/2022 FINDINGS: Feeding tube extends into stomach. RIGHT jugular line tip projects over SVC. LEFT subclavian sequential transvenous pacemaker leads project at RIGHT atrium and RIGHT ventricle. Enlargement of cardiac silhouette. Atherosclerotic calcification aorta. Scattered pulmonary infiltrates in the mid to lower lungs greater on LEFT, could represent edema or infection, increased since previous exam. Trace LEFT pleural effusion. No pneumothorax. IMPRESSION: Increased pulmonary infiltrates question asymmetric edema versus infection. Electronically Signed   By: Lavonia Dana M.D.   On: 07/23/2022 08:33   DG Abd Portable 1V  Result Date: 07/21/2022 CLINICAL DATA:  [id number] Encounter for feeding tube placement [id number] EXAM: PORTABLE ABDOMEN - 1 VIEW COMPARISON:  07/13/2022 FINDINGS: Feeding tube tip overlies the gastric body. Previous orogastric tube is been removed.  Nonobstructive bowel gas pattern. Unchanged cardiomegaly. Left basilar opacities, similar prior chest radiograph with trace effusions. IMPRESSION: Feeding tube tip overlies the stomach. Electronically Signed   By: Maurine Simmering M.D.   On: 07/21/2022 12:47   EEG adult  Result Date: 07/19/2022 Lora Havens, MD     07/19/2022 10:01 AM Patient Name: Carmen Green MRN: [id number] Epilepsy Attending: Lora Havens Referring Physician/Provider: Kipp Brood, MD Date: 07/19/2022 Duration: 27.50 mins Patient history: 72yo F ith ams. EEG to evaluate for seizure Level of alertness: Awake AEDs during EEG study: None Technical aspects: This EEG study was done with scalp electrodes positioned according to the 10-20 International system of electrode placement. Electrical activity was reviewed with band pass filter of 1-'70Hz'$ , sensitivity of 7 uV/mm, display speed of 56m/sec with a '60Hz'$  notched filter applied as appropriate. EEG data were recorded continuously and digitally stored.  Video monitoring was available and reviewed as appropriate. Description: No clear posterior dominant rhythm was seen.  EEG showed continuous generalized predominantly 5 to 6 Hz theta slowing admixed with intermittent generalized 2 to 3 Hz delta slowing.  At times, generalized periodic discharges with triphasic morphology were noted at 1 to 2 Hz, more frequent with stimulation.  Hyperventilation and photic stimulation were not performed.   ABNORMALITY - Periodic discharges with triphasic morphology, generalized ( GPDs) -Continuous slow, generalized IMPRESSION: This study showed generalized periodic discharges with  triphasic morphology likely due to toxic-metabolic causes like cefepime toxicity.  Additionally there is moderate degree of encephalopathy.  No seizures or definite epileptiform discharges were seen throughout the recording. Carmen Green   CT HEAD WO CONTRAST (5MM)  Result Date: 07/16/2022 CLINICAL DATA:  Initial evaluation  for mental status change, unknown cause. EXAM: CT HEAD WITHOUT CONTRAST TECHNIQUE: Contiguous axial images were obtained from the base of the skull through the vertex without intravenous contrast. RADIATION DOSE REDUCTION: This exam was performed according to the departmental dose-optimization program which includes automated exposure control, adjustment of the mA and/or kV according to patient size and/or use of iterative reconstruction technique. COMPARISON:  Prior CT from 07/13/2022. FINDINGS: Brain: Cerebral volume within normal limits. No acute intracranial hemorrhage. Asymmetric hyperdensity involving the right temporal lobe felt to be most consistent with beam hardening artifact. No acute large vessel territory infarct. No mass lesion, mass effect or midline shift. No hydrocephalus or extra-axial fluid collection. Vascular: No abnormal hyperdense vessel. Scattered vascular calcifications noted within the carotid siphons. Skull: Scalp soft tissues and calvarium within normal limits. Sinuses/Orbits: Globes orbital soft tissues demonstrate no acute finding. Paranasal sinuses and mastoid air cells are clear. Other: None. IMPRESSION: Negative head CT. No acute intracranial abnormality identified. Electronically Signed   By: Jeannine Boga M.D.   On: 07/16/2022 19:41   DG CHEST PORT 1 VIEW  Result Date: 07/13/2022 CLINICAL DATA:  [id number] Encounter for insertion of tunneled central venous catheter (CVC) with port [id number] EXAM: PORTABLE CHEST 1 VIEW COMPARISON:  Radiograph 07/12/2022 FINDINGS: Endotracheal tube overlies the midthoracic trachea approximately 3.7 cm above the carina. Esophageal temperature probe overlies the mid esophagus. Orogastric tube passes below the diaphragm, tip excluded by collimation. New right neck catheter tip overlies the distal SVC. Unchanged cardiomediastinal silhouette. Unchanged pacemaker leads. There are mild lower lung predominant interstitial and airspace opacities with  trace effusions. No evidence of pneumothorax. Unchanged right rib injuries. Chronic posttraumatic deformity of the right proximal humerus. IMPRESSION: New right neck catheter tip overlies the distal superior vena cava. Lower lung predominant interstitial and airspace opacities with trace effusions, likely due to pulmonary edema. Endotracheal tube overlies the midthoracic trachea approximately 3.7 cm above the carina. Electronically Signed   By: Maurine Simmering M.D.   On: 07/13/2022 16:39   ECHOCARDIOGRAM COMPLETE  Result Date: 07/13/2022    ECHOCARDIOGRAM REPORT   Patient Name:   Carmen Green Date of Exam: 07/13/2022 Medical Rec #:  [id number]        Height:       61.0 in Accession #:    [id number]       Weight:       136.9 lb Date of Birth:  03-Apr-1951        BSA:          1.608 m Patient Age:    3 years         BP:           95/67 mmHg Patient Gender: F                HR:           77 bpm. Exam Location:  Inpatient Procedure: 2D Echo, Color Doppler, Cardiac Doppler and Intracardiac            Opacification Agent Indications:    Cardiac arrest I46.9  History:        Patient has prior history of Echocardiogram examinations, most  recent 07/06/2022. Pacemaker, Mitral Valve Prolapse; Risk                 Factors:Hypertension, Dyslipidemia and GERD.  Sonographer:    Bernadene Person RDCS Referring Phys: [id number] [place]  1. Compared to echo from 07/06/22, LVEF is slightly lower and MR does not appear as severe.  2. Frequent PVCs, couplets, triplets noted during study.. Left ventricular ejection fraction, by estimation, is <20%. The left ventricle has severely decreased function. The left ventricle demonstrates global hypokinesis.  3. Right ventricular systolic function is low normal. The right ventricular size is normal. There is normal pulmonary artery systolic pressure.  4. Left atrial size was severely dilated.  5. Pacer wire present.  6. Mitral regurgitation is eccentric, directed more  posterior Ther is an echo bright density along subvalvular surface May represtne calcified chordae (image 68 ). Moderate mitral valve regurgitation.  7. The aortic valve is tricuspid. Aortic valve regurgitation is not visualized.  8. The inferior vena cava is normal in size with greater than 50% respiratory variability, suggesting right atrial pressure of 3 mmHg. FINDINGS  Left Ventricle: Frequent PVCs, couplets, triplets noted during study. Left ventricular ejection fraction, by estimation, is <20%. The left ventricle has severely decreased function. The left ventricle demonstrates global hypokinesis. Definity contrast agent was given IV to delineate the left ventricular endocardial borders. The left ventricular internal cavity size was normal in size. There is no left ventricular hypertrophy. Right Ventricle: The right ventricular size is normal. Right vetricular wall thickness was not assessed. Right ventricular systolic function is low normal. There is normal pulmonary artery systolic pressure. The tricuspid regurgitant velocity is 2.43 m/s, and with an assumed right atrial pressure of 3 mmHg, the estimated right ventricular systolic pressure is 45.4 mmHg. Left Atrium: Left atrial size was severely dilated. Right Atrium: Pacer wire present. Right atrial size was normal in size. Pericardium: There is no evidence of pericardial effusion. Mitral Valve: Mitral regurgitation is eccentric, directed more posterior Ther is an echo bright density along subvalvular surface May represtne calcified chordae (image 68 ). There is mild thickening of the mitral valve leaflet(s). Mild mitral annular calcification. Moderate mitral valve regurgitation. Tricuspid Valve: The tricuspid valve is normal in structure. Tricuspid valve regurgitation is mild. Aortic Valve: The aortic valve is tricuspid. Aortic valve regurgitation is not visualized. Pulmonic Valve: The pulmonic valve was not well visualized. Pulmonic valve regurgitation is  trivial. No evidence of pulmonic stenosis. Aorta: The aortic root is normal in size and structure. Venous: The inferior vena cava is normal in size with greater than 50% respiratory variability, suggesting right atrial pressure of 3 mmHg. IAS/Shunts: No atrial level shunt detected by color flow Doppler.  LEFT VENTRICLE PLAX 2D LVIDd:         4.80 cm LVIDs:         4.00 cm LV PW:         1.00 cm LV IVS:        1.10 cm LVOT diam:     2.00 cm LV SV:         49 LV SV Index:   31 LVOT Area:     3.14 cm  LV Volumes (MOD) LV vol d, MOD A2C: 118.0 ml LV vol d, MOD A4C: 154.0 ml LV vol s, MOD A2C: 101.0 ml LV vol s, MOD A4C: 116.0 ml LV SV MOD A2C:     17.0 ml LV SV MOD A4C:     154.0  ml LV SV MOD BP:      26.3 ml RIGHT VENTRICLE TAPSE (M-mode): 1.9 cm LEFT ATRIUM             Index        RIGHT ATRIUM           Index LA diam:        4.20 cm 2.61 cm/m   RA Area:     15.30 cm LA Vol (A2C):   95.0 ml 59.09 ml/m  RA Volume:   41.00 ml  25.50 ml/m LA Vol (A4C):   90.6 ml 56.35 ml/m LA Biplane Vol: 95.6 ml 59.46 ml/m  AORTIC VALVE LVOT Vmax:   105.83 cm/s LVOT Vmean:  68.333 cm/s LVOT VTI:    0.156 m  AORTA Ao Root diam: 3.30 cm Ao Asc diam:  3.10 cm MR Peak grad:    89.9 mmHg    TRICUSPID VALVE MR Mean grad:    60.0 mmHg    TR Peak grad:   23.6 mmHg MR Vmax:         474.00 cm/s  TR Vmax:        243.00 cm/s MR Vmean:        368.0 cm/s MR PISA:         1.01 cm     SHUNTS MR PISA Eff ROA: 8 mm        Systemic VTI:  0.16 m MR PISA Radius:  0.40 cm      Systemic Diam: 2.00 cm Dorris Carnes MD Electronically signed by Dorris Carnes MD Signature Date/Time: 07/13/2022/3:05:27 PM    Final    CT HEAD WO CONTRAST (5MM)  Result Date: 07/13/2022 CLINICAL DATA:  Mental status change. EXAM: CT HEAD WITHOUT CONTRAST TECHNIQUE: Contiguous axial images were obtained from the base of the skull through the vertex without intravenous contrast. RADIATION DOSE REDUCTION: This exam was performed according to the departmental dose-optimization  program which includes automated exposure control, adjustment of the mA and/or kV according to patient size and/or use of iterative reconstruction technique. COMPARISON:  None Available. FINDINGS: Brain: No evidence of acute infarction, hemorrhage, hydrocephalus, extra-axial collection or mass lesion/mass effect. Vascular: No hyperdense vessel or unexpected calcification. Skull: Normal. Negative for fracture or focal lesion. Sinuses/Orbits: No acute finding. IMPRESSION: No acute intracranial process. Electronically Signed   By: Sammie Bench M.D.   On: 07/13/2022 12:49   [place] EKG SITE RITE  Result Date: 07/13/2022 If Site Rite image not attached, placement could not be confirmed due to current cardiac rhythm.  EEG adult  Result Date: 07/13/2022 Lora Havens, MD     07/13/2022  8:31 AM Patient Name: Carmen Green MRN: [id number] Epilepsy Attending: Lora Havens Referring Physician/Provider: Corey Harold, NP Date: 07/12/2022 Duration: 21.13 mins Patient history: 72 year old female status post cardiac arrest.  EEG to evaluate for seizure. Level of alertness: comatose AEDs during EEG study: Propofol Technical aspects: This EEG study was done with scalp electrodes positioned according to the 10-20 International system of electrode placement. Electrical activity was reviewed with band pass filter of 1-'70Hz'$ , sensitivity of 7 uV/mm, display speed of 84m/sec with a '60Hz'$  notched filter applied as appropriate. EEG data were recorded continuously and digitally stored.  Video monitoring was available and reviewed as appropriate. Description: EEG showed continuous generalized background suppression which was not reactive to tactile stimulation.  Hyperventilation and photic stimulation were not performed.    ABNORMALITY -Background suppression, generalized  IMPRESSION: This study is suggestive  of profound diffuse encephalopathy, nonspecific etiology. No seizures or epileptiform discharges were seen throughout  the recording.  Lora Havens   DG Chest Port 1 View  Result Date: 07/12/2022 CLINICAL DATA:  Intubation EXAM: PORTABLE CHEST 1 VIEW COMPARISON:  Chest x-ray dated July 12, 2022 FINDINGS: ETT tip is proximally 2 cm from the carina. Esophageal temperature probe noted. NG/OG tube is partially visualized coursing below the diaphragm. Left chest wall dual lead pacer unchanged lead position. Capped right chest wall leads. Cardiomegaly, similar to prior when accounting for differences in exam technique. New diffuse interstitial opacities, likely due to pulmonary edema. No large pleural effusion or pneumothorax. New right-sided rib fractures. IMPRESSION: 1. ETT tip is proximally 2 cm from the carina. 2. Diffuse interstitial opacities, likely due to pulmonary edema. 3. New right-sided rib fractures. Electronically Signed   By: Yetta Glassman M.D.   On: 07/12/2022 20:26   DG Chest 2 View  Result Date: 07/12/2022 CLINICAL DATA:  Shortness of breath. EXAM: CHEST - 2 VIEW COMPARISON:  May 27, 2022 FINDINGS: There is stable multi lead AICD wire positioning. The heart size and mediastinal contours are within normal limits. Mild prominence of the perihilar pulmonary vasculature is seen. Mild, diffuse, chronic appearing increased lung markings are seen. Mild atelectasis is noted within the left lung base. There is no evidence of a pleural effusion or pneumothorax. The visualized skeletal structures are unremarkable. IMPRESSION: 1. Mild pulmonary vascular congestion. 2. Mild left basilar atelectasis. Electronically Signed   By: Virgina Norfolk M.D.   On: 07/12/2022 01:10   ECHOCARDIOGRAM COMPLETE  Result Date: 07/06/2022    ECHOCARDIOGRAM REPORT   Patient Name:   Carmen Green Date of Exam: 07/06/2022 Medical Rec #:  [id number]        Height:       61.0 in Accession #:    [id number]       Weight:       140.2 lb Date of Birth:  11/03/1950        BSA:          1.624 m Patient Age:    48 years         BP:            132/80 mmHg Patient Gender: F                HR:           68 bpm. Exam Location:  [place] Procedure: 2D Echo, Cardiac Doppler, Color Doppler and Intracardiac            Opacification Agent Indications:    R06.00 Dyspnea  History:        Patient has prior history of Echocardiogram examinations, most                 recent 08/17/2019. Pacemaker, Mitral Valve Prolapse,                 Arrythmias:Complete Heart Block, Signs/Symptoms:Dyspnea and                 Fatigue; Risk Factors:Family History of Coronary Artery Disease,                 Hypertension and Dyslipidemia.  Sonographer:    Deliah Boston RDCS Referring Phys: Shirley Friar IMPRESSIONS  1. Left ventricular ejection fraction, by estimation, is 25 to 30%. The left ventricle has severely decreased function. The left ventricle demonstrates global hypokinesis. The left ventricular internal cavity  size was mildly dilated. There is mild asymmetric left ventricular hypertrophy of the basal-septal segment. Left ventricular diastolic parameters are consistent with Grade II diastolic dysfunction (pseudonormalization).  2. Right ventricular systolic function is mildly reduced. The right ventricular size is normal. There is moderately elevated pulmonary artery systolic pressure. The estimated right ventricular systolic pressure is 17.0 mmHg.  3. Left atrial size was severely dilated.  4. The mitral valve is abnormal, the valve is calcified and posterior leaflet appears restricted. Severe mitral valve regurgitation. No evidence of mitral stenosis.  5. Tricuspid valve regurgitation is mild to moderate.  6. The aortic valve is tricuspid. Aortic valve regurgitation is not visualized. No aortic stenosis is present.  7. The inferior vena cava is normal in size with greater than 50% respiratory variability, suggesting right atrial pressure of 3 mmHg.  8. Function appears significant worse than prior echo, consider CHF clinic referral. FINDINGS  Left  Ventricle: Left ventricular ejection fraction, by estimation, is 25 to 30%. The left ventricle has severely decreased function. The left ventricle demonstrates global hypokinesis. Definity contrast agent was given IV to delineate the left ventricular endocardial borders. The left ventricular internal cavity size was mildly dilated. There is mild asymmetric left ventricular hypertrophy of the basal-septal segment. Left ventricular diastolic parameters are consistent with Grade II diastolic  dysfunction (pseudonormalization). Right Ventricle: The right ventricular size is normal. No increase in right ventricular wall thickness. Right ventricular systolic function is mildly reduced. There is moderately elevated pulmonary artery systolic pressure. The tricuspid regurgitant velocity is 3.27 m/s, and with an assumed right atrial pressure of 3 mmHg, the estimated right ventricular systolic pressure is 01.7 mmHg. Left Atrium: Left atrial size was severely dilated. Right Atrium: Right atrial size was normal in size. Pericardium: There is no evidence of pericardial effusion. Mitral Valve: The mitral valve is abnormal. There is moderate calcification of the mitral valve leaflet(s). Mild mitral annular calcification. Severe mitral valve regurgitation. No evidence of mitral valve stenosis. Tricuspid Valve: The tricuspid valve is normal in structure. Tricuspid valve regurgitation is mild to moderate. Aortic Valve: The aortic valve is tricuspid. Aortic valve regurgitation is not visualized. No aortic stenosis is present. Pulmonic Valve: The pulmonic valve was normal in structure. Pulmonic valve regurgitation is not visualized. Aorta: The aortic root is normal in size and structure. Venous: The inferior vena cava is normal in size with greater than 50% respiratory variability, suggesting right atrial pressure of 3 mmHg. IAS/Shunts: No atrial level shunt detected by color flow Doppler. Additional Comments: A device lead is  visualized in the right ventricle.  LEFT VENTRICLE PLAX 2D LVIDd:         5.70 cm   Diastology LVIDs:         4.80 cm   LV e' medial:    5.00 cm/s LV PW:         1.00 cm   LV E/e' medial:  29.4 LV IVS:        1.20 cm   LV e' lateral:   8.05 cm/s LVOT diam:     2.00 cm   LV E/e' lateral: 18.3 LV SV:         42 LV SV Index:   26 LVOT Area:     3.14 cm  RIGHT VENTRICLE RV Basal diam:  3.70 cm RV S prime:     14.00 cm/s TAPSE (M-mode): 2.6 cm LEFT ATRIUM              Index  RIGHT ATRIUM           Index LA diam:        4.90 cm  3.02 cm/m   RA Area:     13.10 cm LA Vol (A2C):   119.0 ml 73.27 ml/m  RA Volume:   32.80 ml  20.20 ml/m LA Vol (A4C):   126.0 ml 77.58 ml/m LA Biplane Vol: 128.0 ml 78.81 ml/m  AORTIC VALVE LVOT Vmax:   65.55 cm/s LVOT Vmean:  43.200 cm/s LVOT VTI:    0.134 m  AORTA Ao Root diam: 3.00 cm Ao Asc diam:  3.00 cm MITRAL VALVE                TRICUSPID VALVE MV Area (PHT): cm          TR Peak grad:   42.8 mmHg MV Decel Time: 236 msec     TR Vmax:        327.00 cm/s MR Peak grad: 114.5 mmHg MR Mean grad: 57.0 mmHg     SHUNTS MR Vmax:      535.00 cm/s   Systemic VTI:  0.13 m MR Vmean:     337.0 cm/s    Systemic Diam: 2.00 cm MV E velocity: 147.00 cm/s MV A velocity: 129.50 cm/s MV E/A ratio:  1.14 Dalton McleanMD Electronically signed by Franki Monte Signature Date/Time: 07/06/2022/5:13:10 PM    Final    CUP PACEART INCLINIC DEVICE CHECK  Result Date: 06/28/2022 Pacemaker check in clinic. Normal device function. Thresholds, sensing, impedances consistent with previous measurements. Device programmed to maximize longevity. AMS appear short AT. No high ventricular rates noted. Device programmed at appropriate safety margins. Histogram distribution appropriate for patient activity level. Device programmed to optimize intrinsic conduction. Estimated longevity 7 yr, 8 mo. Patient enrolled in remote follow-up. Patient education completed.  (Echo, Carotid, EGD, Colonoscopy, ERCP)     Subjective: Patient was seen in the morning rounds.  Intermittently agitated and uncomfortable.   Discharge Exam: Vitals:   07/23/22 1400 07/23/22 1500  BP: (!) 159/65 (!) 168/69  Pulse: 90 91  Resp: (!) 25 (!) 23  Temp:    SpO2: 95% 94%   Vitals:   07/23/22 1200 07/23/22 1300 07/23/22 1400 07/23/22 1500  BP: (!) 147/59 (!) 159/65 (!) 159/65 (!) 168/69  Pulse: 92 90 90 91  Resp: (!) 22 (!) 35 (!) 25 (!) 23  Temp:      TempSrc:      SpO2: 95% 94% 95% 94%  Weight:      Height:        Sick looking.  Does not follow commands.  Becomes uncomfortable and unpurposeful movements on stimulation. She has irregularly irregular rhythm.  She has pacemaker in place.    The results of significant diagnostics from this hospitalization (including imaging, microbiology, ancillary and laboratory) are listed below for reference.     Microbiology: Recent Results (from the past 240 hour(s))  MRSA Next Gen by PCR, Nasal     Status: None   Collection Time: 07/13/22  6:36 PM   Specimen: Nasal Mucosa; Nasal Swab  Result Value Ref Range Status   MRSA by PCR Next Gen NOT DETECTED NOT DETECTED Final    Comment: (NOTE) The GeneXpert MRSA Assay (FDA approved for NASAL specimens only), is one component of a comprehensive MRSA colonization surveillance program. It is not intended to diagnose MRSA infection nor to guide or monitor treatment for MRSA infections. Test performance is not FDA approved in patients  less than 81 years old. Performed at [facility], [place] [address], [place], [place] [address]   Resp panel by RT-PCR (RSV, Flu A&B, Covid) Urine, Clean Catch     Status: None   Collection Time: 07/14/22  9:50 AM   Specimen: Urine, Clean Catch; Nasal Swab  Result Value Ref Range Status   SARS Coronavirus 2 by RT PCR NEGATIVE NEGATIVE Final    Comment: (NOTE) SARS-CoV-2 target nucleic acids are NOT DETECTED.  The SARS-CoV-2 RNA is generally detectable in upper  respiratory specimens during the acute phase of infection. The lowest concentration of SARS-CoV-2 viral copies this assay can detect is 138 copies/mL. A negative result does not preclude SARS-Cov-2 infection and should not be used as the sole basis for treatment or other patient management decisions. A negative result may occur with  improper specimen collection/handling, submission of specimen other than nasopharyngeal swab, presence of viral mutation(s) within the areas targeted by this assay, and inadequate number of viral copies(<138 copies/mL). A negative result must be combined with clinical observations, patient history, and epidemiological information. The expected result is Negative.  Fact Sheet for Patients:  EntrepreneurPulse.com.au  Fact Sheet for Healthcare Providers:  IncredibleEmployment.be  This test is no t yet approved or cleared by the [place] FDA and  has been authorized for detection and/or diagnosis of SARS-CoV-2 by FDA under an Emergency Use Authorization (EUA). This EUA will remain  in effect (meaning this test can be used) for the duration of the COVID-19 declaration under Section 564(b)(1) of the Act, 21 U.S.C.section 360bbb-3(b)(1), unless the authorization is terminated  or revoked sooner.       Influenza A by PCR NEGATIVE NEGATIVE Final   Influenza B by PCR NEGATIVE NEGATIVE Final    Comment: (NOTE) The Xpert Xpress SARS-CoV-2/FLU/RSV plus assay is intended as an aid in the diagnosis of influenza from Nasopharyngeal swab specimens and should not be used as a sole basis for treatment. Nasal washings and aspirates are unacceptable for Xpert Xpress SARS-CoV-2/FLU/RSV testing.  Fact Sheet for Patients: EntrepreneurPulse.com.au  Fact Sheet for Healthcare Providers: IncredibleEmployment.be  This test is not yet approved or cleared by the [place] FDA and has been  authorized for detection and/or diagnosis of SARS-CoV-2 by FDA under an Emergency Use Authorization (EUA). This EUA will remain in effect (meaning this test can be used) for the duration of the COVID-19 declaration under Section 564(b)(1) of the Act, 21 U.S.C. section 360bbb-3(b)(1), unless the authorization is terminated or revoked.     Resp Syncytial Virus by PCR NEGATIVE NEGATIVE Final    Comment: (NOTE) Fact Sheet for Patients: EntrepreneurPulse.com.au  Fact Sheet for Healthcare Providers: IncredibleEmployment.be  This test is not yet approved or cleared by the [place] FDA and has been authorized for detection and/or diagnosis of SARS-CoV-2 by FDA under an Emergency Use Authorization (EUA). This EUA will remain in effect (meaning this test can be used) for the duration of the COVID-19 declaration under Section 564(b)(1) of the Act, 21 U.S.C. section 360bbb-3(b)(1), unless the authorization is terminated or revoked.  Performed at [facility], [place] [address], [place], [place] [address]   Urine Culture     Status: None   Collection Time: 07/14/22  9:53 AM   Specimen: Urine, Clean Catch  Result Value Ref Range Status   Specimen Description URINE, CLEAN CATCH  Final   Special Requests NONE  Final   Culture   Final    NO GROWTH Performed at  [facility], [place] [address], [place], [place] [address]    Report Status 07/15/2022 FINAL  Final  Culture, Respiratory w Gram Stain     Status: None   Collection Time: 07/14/22  9:54 AM   Specimen: Tracheal Aspirate; Respiratory  Result Value Ref Range Status   Specimen Description TRACHEAL ASPIRATE  Final   Special Requests NONE  Final   Gram Stain   Final    NO WBC SEEN RARE GRAM POSITIVE RODS RARE GRAM NEGATIVE RODS Performed at [facility], [address] [place], [place] [address]    Culture FEW SERRATIA MARCESCENS  Final   Report Status 07/16/2022 FINAL  Final    Organism ID, Bacteria SERRATIA MARCESCENS  Final      Susceptibility   Serratia marcescens - MIC*    CEFAZOLIN >=64 RESISTANT Resistant     CEFEPIME <=0.12 SENSITIVE Sensitive     CEFTAZIDIME <=1 SENSITIVE Sensitive     CEFTRIAXONE <=0.25 SENSITIVE Sensitive     CIPROFLOXACIN <=0.25 SENSITIVE Sensitive     GENTAMICIN <=1 SENSITIVE Sensitive     TRIMETH/SULFA <=20 SENSITIVE Sensitive     * FEW SERRATIA MARCESCENS  Culture, blood (Routine X 2) w Reflex to ID Panel     Status: None   Collection Time: 07/14/22 10:23 AM   Specimen: BLOOD RIGHT HAND  Result Value Ref Range Status   Specimen Description BLOOD RIGHT HAND  Final   Special Requests IN PEDIATRIC BOTTLE Blood Culture adequate volume  Final   Culture   Final    NO GROWTH 5 DAYS Performed at [facility], [place] [address] [place], [place] [address]    Report Status 07/19/2022 FINAL  Final  Culture, blood (Routine X 2) w Reflex to ID Panel     Status: None   Collection Time: 07/14/22 10:31 AM   Specimen: BLOOD LEFT HAND  Result Value Ref Range Status   Specimen Description BLOOD LEFT HAND  Final   Special Requests IN PEDIATRIC BOTTLE Blood Culture adequate volume  Final   Culture   Final    NO GROWTH 5 DAYS Performed at [facility], [place] [address], [place], [place] [address]    Report Status 07/19/2022 FINAL  Final     Labs: BNP (last 3 results) Recent Labs    07/12/22 0057  BNP 381.0*   Basic Metabolic Panel: Recent Labs  Lab 07/17/22 0454 07/18/22 0410 07/18/22 0416 07/19/22 0438 07/20/22 0405 07/21/22 0407 07/22/22 0303 07/23/22 0412  NA 136   < > 137 138 140 139 136 137  K 3.9   < > 4.2 3.9 3.9 4.0 3.9 4.0  CL 108  --  108 109 108 105 99 103  CO2 22  --  '24 24 26 26 31 26  '$ GLUCOSE 169*  --  150* 146* 162* 145* 122* 137*  BUN 24*  --  25* 28* 30* 23 24* 24*  CREATININE 0.73  --  0.67 0.65 0.73 0.63 0.59 0.66  CALCIUM 8.8*  --  8.9 8.7* 8.8* 8.5* 8.3* 8.5*  MG 2.0  --  2.0  --  2.2  --  2.3  2.3  PHOS  --   --   --   --   --   --  3.4  --    < > = values in this interval not displayed.   Liver Function Tests: Recent Labs  Lab 07/17/22 0454  AST 61*  ALT 111*  ALKPHOS 49  BILITOT 0.6  PROT 5.3*  ALBUMIN 2.6*   No results for input(s): "LIPASE", "AMYLASE" in the last 168 hours. No results for input(s): "AMMONIA" in the last 168 hours. CBC: Recent Labs  Lab 07/19/22 0600 07/19/22 1000 07/20/22 1539 07/20/22 2123 07/21/22 0859 07/21/22 2332 07/23/22 0412  WBC 9.0   < > 10.0 10.5 10.1 7.9 9.9  NEUTROABS 7.0  --   --   --   --   --   --   HGB 7.3*   < > 8.3* 8.3* 7.9* 7.8* 8.6*  HCT 22.4*   < > 24.5* 25.5* 23.5* 24.1* 26.7*  MCV 102.3*   < > 99.2 101.6* 100.4* 100.0 101.1*  PLT 142*   < > 171 169 162 164 242   < > = values in this interval not displayed.   Cardiac Enzymes: No results for input(s): "CKTOTAL", "CKMB", "CKMBINDEX", "TROPONINI" in the last 168 hours. BNP: Invalid input(s): "POCBNP" CBG: Recent Labs  Lab 07/22/22 1940 07/22/22 2349 07/23/22 0403 07/23/22 0753 07/23/22 1115  GLUCAP 134* 138* 128* 140* 121*   D-Dimer No results for input(s): "DDIMER" in the last 72 hours. Hgb A1c No results for input(s): "HGBA1C" in the last 72 hours. Lipid Profile No results for input(s): "CHOL", "HDL", "LDLCALC", "TRIG", "CHOLHDL", "LDLDIRECT" in the last 72 hours. Thyroid function studies No results for input(s): "TSH", "T4TOTAL", "T3FREE", "THYROIDAB" in the last 72 hours.  Invalid input(s): "FREET3" Anemia work up No results for input(s): "VITAMINB12", "FOLATE", "FERRITIN", "TIBC", "IRON", "RETICCTPCT" in the last 72 hours. Urinalysis    Component Value Date/Time   COLORURINE YELLOW 07/14/2022 1105   APPEARANCEUR HAZY (A) 07/14/2022 1105   APPEARANCEUR Clear 04/05/2022 1101   LABSPEC 1.028 07/14/2022 1105   PHURINE 5.0 07/14/2022 1105   GLUCOSEU NEGATIVE 07/14/2022 1105   HGBUR NEGATIVE 07/14/2022 1105   BILIRUBINUR NEGATIVE 07/14/2022 1105    BILIRUBINUR Negative 04/05/2022 1101   KETONESUR NEGATIVE 07/14/2022 1105   PROTEINUR 30 (A) 07/14/2022 1105   UROBILINOGEN 1.0 09/18/2010 1648   NITRITE NEGATIVE 07/14/2022 1105   LEUKOCYTESUR NEGATIVE 07/14/2022 1105   Sepsis Labs Recent Labs  Lab 07/20/22 2123 07/21/22 0859 07/21/22 2332 07/23/22 0412  WBC 10.5 10.1 7.9 9.9   Microbiology Recent Results (from the past 240 hour(s))  MRSA Next Gen by PCR, Nasal     Status: None   Collection Time: 07/13/22  6:36 PM   Specimen: Nasal Mucosa; Nasal Swab  Result Value Ref Range Status   MRSA by PCR Next Gen NOT DETECTED NOT DETECTED Final    Comment: (NOTE) The GeneXpert MRSA Assay (FDA approved for NASAL specimens only), is one component of a comprehensive MRSA colonization surveillance program. It is not intended to diagnose MRSA infection nor to guide or monitor treatment for MRSA infections. Test performance is not FDA approved in patients less than 82 years old. Performed at [facility], [place] [address] [place], [place] [address]   Resp panel by RT-PCR (RSV, Flu A&B, Covid) Urine, Clean Catch     Status: None   Collection Time: 07/14/22  9:50 AM   Specimen: Urine, Clean Catch; Nasal Swab  Result Value Ref Range Status   SARS Coronavirus 2 by RT PCR NEGATIVE NEGATIVE Final    Comment: (NOTE) SARS-CoV-2 target nucleic acids are NOT DETECTED.  The SARS-CoV-2 RNA is generally detectable in upper respiratory specimens during the acute phase of infection. The lowest concentration of SARS-CoV-2 viral copies this assay can detect  is 138 copies/mL. A negative result does not preclude SARS-Cov-2 infection and should not be used as the sole basis for treatment or other patient management decisions. A negative result may occur with  improper specimen collection/handling, submission of specimen other than nasopharyngeal swab, presence of viral mutation(s) within the areas targeted by this assay, and inadequate number of  viral copies(<138 copies/mL). A negative result must be combined with clinical observations, patient history, and epidemiological information. The expected result is Negative.  Fact Sheet for Patients:  EntrepreneurPulse.com.au  Fact Sheet for Healthcare Providers:  IncredibleEmployment.be  This test is no t yet approved or cleared by the [place] FDA and  has been authorized for detection and/or diagnosis of SARS-CoV-2 by FDA under an Emergency Use Authorization (EUA). This EUA will remain  in effect (meaning this test can be used) for the duration of the COVID-19 declaration under Section 564(b)(1) of the Act, 21 U.S.C.section 360bbb-3(b)(1), unless the authorization is terminated  or revoked sooner.       Influenza A by PCR NEGATIVE NEGATIVE Final   Influenza B by PCR NEGATIVE NEGATIVE Final    Comment: (NOTE) The Xpert Xpress SARS-CoV-2/FLU/RSV plus assay is intended as an aid in the diagnosis of influenza from Nasopharyngeal swab specimens and should not be used as a sole basis for treatment. Nasal washings and aspirates are unacceptable for Xpert Xpress SARS-CoV-2/FLU/RSV testing.  Fact Sheet for Patients: EntrepreneurPulse.com.au  Fact Sheet for Healthcare Providers: IncredibleEmployment.be  This test is not yet approved or cleared by the [place] FDA and has been authorized for detection and/or diagnosis of SARS-CoV-2 by FDA under an Emergency Use Authorization (EUA). This EUA will remain in effect (meaning this test can be used) for the duration of the COVID-19 declaration under Section 564(b)(1) of the Act, 21 U.S.C. section 360bbb-3(b)(1), unless the authorization is terminated or revoked.     Resp Syncytial Virus by PCR NEGATIVE NEGATIVE Final    Comment: (NOTE) Fact Sheet for Patients: EntrepreneurPulse.com.au  Fact Sheet for Healthcare  Providers: IncredibleEmployment.be  This test is not yet approved or cleared by the [place] FDA and has been authorized for detection and/or diagnosis of SARS-CoV-2 by FDA under an Emergency Use Authorization (EUA). This EUA will remain in effect (meaning this test can be used) for the duration of the COVID-19 declaration under Section 564(b)(1) of the Act, 21 U.S.C. section 360bbb-3(b)(1), unless the authorization is terminated or revoked.  Performed at [facility], [place] [address] [place], [place] [address]   Urine Culture     Status: None   Collection Time: 07/14/22  9:53 AM   Specimen: Urine, Clean Catch  Result Value Ref Range Status   Specimen Description URINE, CLEAN CATCH  Final   Special Requests NONE  Final   Culture   Final    NO GROWTH Performed at [facility], [place] [address], [place], [place] [address]    Report Status 07/15/2022 FINAL  Final  Culture, Respiratory w Gram Stain     Status: None   Collection Time: 07/14/22  9:54 AM   Specimen: Tracheal Aspirate; Respiratory  Result Value Ref Range Status   Specimen Description TRACHEAL ASPIRATE  Final   Special Requests NONE  Final   Gram Stain   Final    NO WBC SEEN RARE GRAM POSITIVE RODS RARE GRAM NEGATIVE RODS Performed at [facility], [address] [place], [place] [address]    Culture FEW SERRATIA MARCESCENS  Final  Report Status 07/16/2022 FINAL  Final   Organism ID, Bacteria SERRATIA MARCESCENS  Final      Susceptibility   Serratia marcescens - MIC*    CEFAZOLIN >=64 RESISTANT Resistant     CEFEPIME <=0.12 SENSITIVE Sensitive     CEFTAZIDIME <=1 SENSITIVE Sensitive     CEFTRIAXONE <=0.25 SENSITIVE Sensitive     CIPROFLOXACIN <=0.25 SENSITIVE Sensitive     GENTAMICIN <=1 SENSITIVE Sensitive     TRIMETH/SULFA <=20 SENSITIVE Sensitive     * FEW SERRATIA MARCESCENS  Culture, blood (Routine X 2) w Reflex to ID Panel     Status: None   Collection Time:  07/14/22 10:23 AM   Specimen: BLOOD RIGHT HAND  Result Value Ref Range Status   Specimen Description BLOOD RIGHT HAND  Final   Special Requests IN PEDIATRIC BOTTLE Blood Culture adequate volume  Final   Culture   Final    NO GROWTH 5 DAYS Performed at [facility], [address] [place], [place] [address]    Report Status 07/19/2022 FINAL  Final  Culture, blood (Routine X 2) w Reflex to ID Panel     Status: None   Collection Time: 07/14/22 10:31 AM   Specimen: BLOOD LEFT HAND  Result Value Ref Range Status   Specimen Description BLOOD LEFT HAND  Final   Special Requests IN PEDIATRIC BOTTLE Blood Culture adequate volume  Final   Culture   Final    NO GROWTH 5 DAYS Performed at [facility], [place] [address], [place], [place] [address]    Report Status 07/19/2022 FINAL  Final     Time coordinating discharge: 35 minutes  SIGNED:   Barb Merino, MD  [facility] 07/23/2022, 4:00 PM

## 2022-07-23 NOTE — Progress Notes (Addendum)
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    Palliative care has been asked to get involved in the setting of prolonged hospitalization status post PEA arrest for further goals of care discussions.  Today's Discussion 07/23/2022  *Please note that this is a verbal dictation therefore any spelling or grammatical errors are due to the "[employer] One" system interpretation.  Chart reviewed inclusive of vital signs, progress notes, laboratory results, and diagnostic images.   A family meeting was held at 37 this morning with patient's husband Gwyndolyn Saxon, daughter Caryl Pina, aunt Letta Median, on [place], cousin Lattie Haw, and nephew in Air[employer].  A formal review of Tiann is 10-day hospitalization was held.  Discussed patient's clinical condition at this time.  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  Reviewed that often once a part of the brain is injured it is unfortunately something that often results in a new baseline level of physical and mental functioning.  Discussed patient's downtime and the efforts made to resuscitate her.  Reviewed patient's hospitalization since then and her being on and off medications given her level of encephalopathy.  Reviewed diagnostic tests as well as images with patient's family for further understanding of injuries [place] to date.  Discussed long-term what patient's life may look like and elicited goals of care.  Verbena had been clear with her husband that if she was never well enough to bathe or dress herself that would be incongruent with the type of life she would want to live.  I shared that unfortunately patients we have seen similar to [place] and her situation often do results and needing long-term nutrition via gastrostomy tube, they often must rely on living in a facility as there care  needs exceed what family is able to provide.  We reviewed the possibility of complications at long-term care such as pressure injuries, recurrent  pneumonias, & urinary infections.  Created space and opportunity for patient to explore thoughts feelings and fears regarding current medical situation.  Patient's family asked many thoughtful questions regarding her care at this time.  They share that they do not want to live in the question of what if's.  While speaking Dr. Lorrin Goodell came in and provided his open and honest interpretation from a neurological perspective regarding Kaylyn's present care.  He shares he is sought followed her for 3 days and has not seen significant improvement even with cessation of cefepime.  He goes on to state that Rhilynn is certainly in a gray area whereby is hard to prognosis what her future may look like though it is with some degree of certainty that he can share she will need long-term care for the time being as well as artificial nutrition.  He states that recovery from their is unknown given the lack of prior data and similar patient's.  Patient's family express openly honestly and clearly that she would not want to be dependent on care whether that be for a week a month or 6 months.  A discussion of patient's other comorbidities was held inclusive of her cardiac history.  Patient's family endorsed that she had had a lot of chronic disease burden dating back to 2.  Throughout this conversation it became clear that family's goals are geared towards comfort which they all vocalized uniformly.  We talked about transition to comfort measures  in house and what that would entail inclusive of medications to control pain, dyspnea, agitation, nausea, itching, and hiccups.   We discussed stopping all uneccessary measures such as cardiac monitoring, blood draws, needle sticks, and frequent vital signs. Utilized reflective listening throughout our time together.   We  determined that we will instill comfort care and patient will transition to [facility] in [place] per their preference once a bed is available. Questions and concerns addressed/Palliative Support Provided.   Objective Assessment: Vital Signs Vitals:   07/23/22 1100 07/23/22 1117  BP: 139/63   Pulse: 91   Resp: (!) 25   Temp:  98.7 F (37.1 C)  SpO2: 95%     Intake/Output Summary (Last 24 hours) at 07/23/2022 1204 Last data filed at 07/23/2022 7048 Gross per 24 hour  Intake 1433.5 ml  Output 1050 ml  Net 383.5 ml   Last Weight  Most recent update: 07/23/2022  4:34 AM    Weight  66.9 kg (147 lb 7.8 oz)            Gen: Frail elderly Caucasian female HEENT: Coretrack, moist mucous membranes CV: Regular rate and rhythm  PULM:  On RA, breathing is even and nonlabored ABD: soft/nontender  EXT: No edema, moving around Neuro: Disoriented - not tracking or following commands  SUMMARY OF RECOMMENDATIONS   DNAR/DNI  Comfort Care  Discontinue nasogastric tube  Ativan and morphine Q6H ATC  Additional comfort medications per [facility]  Ongoing palliative care support  Plan for transition to [facility] once a bed becomes available  Prognosis is limited to days to a week at most  Time Spent: 105 Billing based on MDM: High ______________________________________________________________________________________ [place] Team Team Cell Phone: [phone] Please utilize secure chat with additional questions, if there is no response within 30 minutes please call the above phone number  Palliative Medicine Team providers are available by phone from 7am to 7pm daily and can be reached through the team cell phone.  Should this patient require assistance outside of these hours, please call the patient's attending physician.

## 2022-07-23 NOTE — TOC Transition Note (Signed)
Transition of Care [facility]) - CM/SW Discharge Note   Patient Details  Name: Carmen Green MRN: [id number] Date of Birth: Feb 23, 1951  Transition of Care [facility]) CM/SW Contact:  Milas Gain, [place] Phone Number: 07/23/2022, 4:02 PM   Clinical Narrative:     Patient will DC to: [facility]   Anticipated DC date: 07/23/2022  Family notified: Gwyndolyn Saxon  Transport by: Corey Harold  ?  Per MD patient ready for DC to [facility] . RN, patient, patient's family, and facility notified of DC.  RN given number for report tele# [phone]. DC packet on chart.DNR signed by MD attached to patients dc packet. Ambulance transport requested for patient.  CSW signing off.    Final next level of care: [facility]) Barriers to Discharge: No Barriers Identified   Patient Goals and CMS Choice CMS Medicare.gov Compare Post Acute Care list provided to:: Patient Represenative (must comment) Choice offered to / list presented to : Spouse  Discharge Placement                Patient chooses bed at:  [facility])   Name of family member notified: Gwyndolyn Saxon Patient and family notified of of transfer: 07/23/22  Discharge Plan and Services Additional resources added to the After Visit Summary for                                       Social Determinants of Health (SDOH) Interventions SDOH Screenings   Food Insecurity: No Food Insecurity (01/08/2022)  Housing: Low Risk  (01/08/2022)  Transportation Needs: No Transportation Needs (01/08/2022)  Alcohol Screen: Low Risk  (01/08/2022)  Depression (PHQ2-9): Low Risk  (06/16/2022)  Financial Resource Strain: Low Risk  (01/08/2022)  Physical Activity: Insufficiently Active (01/08/2022)  Social Connections: Socially Integrated (01/08/2022)  Stress: No Stress Concern Present (01/08/2022)  Tobacco Use: Low Risk  (07/12/2022)     Readmission Risk Interventions     No data to display

## 2022-07-23 NOTE — Progress Notes (Signed)
Transition to comfort  Heart failure team will sign off as of 07/23/22 Jaxden Blyden NP-C  4:00 PM

## 2022-07-23 NOTE — Progress Notes (Addendum)
Patient ID: Carmen Green, female   DOB: February 27, 1951, 72 y.o.   MRN: [id number]     Advanced Heart Failure Rounding Note  PCP-Cardiologist: None   Subjective:    1/31 Extubated. Antibiotics completed.  2/1 Amio drip switched to po.   Neuro concerned for possible cefepime toxicity. Cefepime discontinued. Switched to [employer].  No purposeful movements. Nonverbal.   Objective:   Weight Range: 66.9 kg Body mass index is 27.87 kg/m.   Vital Signs:   Temp:  [98.4 F (36.9 C)-99 F (37.2 C)] 98.8 F (37.1 C) (02/02 0751) Pulse Rate:  [52-99] 88 (02/02 0700) Resp:  [16-34] 16 (02/02 0700) BP: (92-170)/(40-80) 139/58 (02/02 0700) SpO2:  [77 %-100 %] 98 % (02/02 0700) Weight:  [66.9 kg] 66.9 kg (02/02 0434) Last BM Date : 07/21/22  Weight change: Filed Weights   07/21/22 0203 07/22/22 0318 07/23/22 0434  Weight: 67.2 kg 66.9 kg 66.9 kg    Intake/Output:   Intake/Output Summary (Last 24 hours) at 07/23/2022 0759 Last data filed at 07/23/2022 0600 Gross per 24 hour  Intake 1286.94 ml  Output 1100 ml  Net 186.94 ml    CVP 4  Physical Exam  General: In bed. Wet cough HEENT: + Cortrak Neck: supple. no JVD. Carotids 2+ bilat; no bruits. No lymphadenopathy or thryomegaly appreciated. Cor: PMI nondisplaced. Regular rate & rhythm. No rubs, gallops or murmurs. Lungs: rhonchi on right. On room air.  Abdomen: soft, nontender, nondistended. No hepatosplenomegaly. No bruits or masses. Good bowel sounds. Extremities: no cyanosis, clubbing, rash, edema Neuro: Opens eyes. No purposeful; movement. Nonverbal.  Telemetry   A paced 91   EKG    N/A  Labs    CBC Recent Labs    07/21/22 2332 07/23/22 0412  WBC 7.9 9.9  HGB 7.8* 8.6*  HCT 24.1* 26.7*  MCV 100.0 101.1*  PLT 164 191   Basic Metabolic Panel Recent Labs    07/22/22 0303 07/23/22 0412  NA 136 137  K 3.9 4.0  CL 99 103  CO2 31 26  GLUCOSE 122* 137*  BUN 24* 24*  CREATININE 0.59 0.66  CALCIUM 8.3* 8.5*   MG 2.3 2.3  PHOS 3.4  --    Liver Function Tests No results for input(s): "AST", "ALT", "ALKPHOS", "BILITOT", "PROT", "ALBUMIN" in the last 72 hours.   BNP: BNP (last 3 results) Recent Labs    07/12/22 0057  BNP 547.6*    ProBNP (last 3 results) Recent Labs    06/28/22 1040  PROBNP 3,230*   Fasting Lipid Panel No results for input(s): "CHOL", "HDL", "LDLCALC", "TRIG", "CHOLHDL", "LDLDIRECT" in the last 72 hours.  Imaging    No results found.   Medications:     Scheduled Medications:  sodium chloride   Intravenous Once   amiodarone  400 mg Per Tube BID   Followed by   Derrill Memo ON 07/29/2022] amiodarone  200 mg Per Tube BID   aspirin  81 mg Per Tube Daily   carvedilol  3.125 mg Per Tube BID WC   Chlorhexidine Gluconate Cloth  6 each Topical Daily   feeding supplement (PROSource TF20)  60 mL Per Tube Daily   fiber supplement (BANATROL TF)  60 mL Per Tube BID   losartan  12.5 mg Per Tube Daily   pantoprazole (PROTONIX) IV  40 mg Intravenous Q12H   spironolactone  25 mg Per Tube Daily    Infusions:  sodium chloride 10 mL/hr at 07/23/22 0600   feeding supplement (VITAL  1.5 CAL) 45 mL/hr at 07/23/22 0600    PRN Medications: sodium chloride, acetaminophen **OR** acetaminophen (TYLENOL) oral liquid 160 mg/5 mL **OR** acetaminophen, clonazePAM, hydrALAZINE, ondansetron (ZOFRAN) IV, oxyCODONE    Patient Profile   72 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  Assessment/Plan   1. PEA Arrest--> V Fib--> shock: Witnessed arrest. CPR/EPI/Shock x2 with ROSC.  Unclear etiology/ Suspect ischemic event.  TSH ok. HS Trop 463-->753.  - LHC if she has meaningful neurologic recovery.  -On po amio .  2. Neuro: Post-arrest, she responds to painful stimuli but nothing purposeful at this point.  She remains on Precedex.  Head CT with nothing acute.  - EEG c/w moderate degree of  encephalopathy.  No seizures  - neurology following. Concerned for possible cefepime toxicity. Cefepime discontinued. Switched to [employer]. 72h washout for cefepime complete prognostication per neuro No purposeful movement. Nonverbal.  3. Acute Hypoxic Respiratory Failure: With aspiration PNA.   On room air.  4. Acute/Chronic HFrEF: Echo this admission with EF < 20%, RV low normal, ?moderate MR. Cause uncertain.  Cannot rule out CAD.  Also with possible severe MR on a prior echo and has been chronically RV paced in setting of CHB. - CVP 3-4. Volume status stable.  - Continue spiro 25 mg daily - Increase losartan to 25 mg daily.  - Continue Coreg 3.125 mg bid.  - Will need LHC/RHC if she has neurological recovery.  - Avoid SGLT2i for now with cystitis 2 occasions last year.  5. Atrial flutter: Paroxysmal.  She is now a-paced.  - Continue po amio.   - Off heparin due to anemia 6.  PVCs: Frequent PVCs.  - Continue po amio.  7. MR: Severe on echo prior to admission, echo here was ?moderate.  - If she has neurologic recovery, will need TEE.  8.  H/O CHB--> St Jude PPM: Gen changed 2004, 2012.  Abandoned R side device. L sided device implanted 2021.  9. Aspiration PNA: Tracheal aspirate + serratia. BCx NGTD  - initially on cefepime. Neurology concerned for possible cefepime toxicity. Cefepime discontinued and switched to [place]. Abx course completed 1/31 10. Anemia: Hgb 6.6 => 7.5=>transfused 1uRBC=>7.8=>7.3=>8.3=>8.0=>8.6 . No overt bleeding. FOBT+  - will hold off on GI consultation for now until we see if any meaningful neurological recovery 11.  DNR  - Completed  72h washout for cefepime . Prognostication per neuro.  Still not following commands. Recommend palliative care consultation  Obtain CXR.    Darrick Grinder, NP-C  07/23/2022 7:59 AM  Patient seen with NP, agree with the above note.   She will move her extremities but nothing purposeful still.    BP stable, hgb trending up.  She is  a-paced.   General: Eyes closed, will moan occasionally.  Neck: No JVD, no thyromegaly or thyroid nodule.  Lungs: Clear to auscultation bilaterally with normal respiratory effort. CV: Nondisplaced PMI.  Heart regular S1/S2, no S3/S4, no murmur.  No peripheral edema.  No carotid bruit.  Normal pedal pulses.  Abdomen: Soft, nontender, no hepatosplenomegaly, no distention.  Skin: Intact without lesions or rashes.  Neurologic: Moves extremities but does not appear purposeful.  Extremities: No clubbing or cyanosis.  HEENT: Normal.   I am concerned that there is anoxic injury, not just cefepime neurotoxicity (today is 4th day off cefepime). No purposeful response.  Neurology following, will continue to wait for  changes in exam over weekend.  Palliative care is involved.    She remains a-paced.  Has had only 1 episode of atrial flutter that was this admission and transient. FOBT+ and anemic, heparin has been stopped. Hgb 8.6 today, will leave off heparin gtt for now.  Amiodarone is now per NGT.    Volume status looks ok with CVP 4. Continue GDMT via Cortrack, can increase losartan to 25 mg daily today.  If she has neurological recovery, needs cath and TEE as above.    She has completed course of ceftazidime for Serratia in sputum.   Loralie Champagne 07/23/2022 9:23 AM

## 2022-07-23 NOTE — Progress Notes (Signed)
[place] Progress Note Patient Name: Carmen Green DOB: 05/25/1951 MRN: [id number]   Date of Service  07/23/2022  HPI/Events of Note  Patient with sub-optimal pain and BP control.  eICU Interventions  Oxycodone ordered for pain, Hydralazine 10 mg iv Q 4 hours for elevated BP.        [name] Anvitha Hutmacher 07/23/2022, 12:17 AM

## 2022-07-23 NOTE — Progress Notes (Signed)
eLink Physician-Brief Progress Note Patient Name: Carmen Green DOB: 02/08/1951 MRN: [id number]   Date of Service  07/23/2022  HPI/Events of Note  Patient with mild delirium.  eICU Interventions  TV and lights turned off. Zyprexa 2.5 mg IM x 1 ordered.        [name] Teng Decou 07/23/2022, 2:20 AM

## 2022-07-23 NOTE — Progress Notes (Signed)
Nutrition Brief Note  Chart reviewed. Pt now transitioning to comfort care with plans to discharge to [facility] once bed available Cortrak removed, TF discontinued, NPO No further nutrition interventions planned at this time.  Please re-consult as needed.   Kerman Passey MS, RDN, LDN, CNSC Registered Dietitian 3 Clinical Nutrition RD Pager and On-Call Pager Number Located in [place]

## 2022-07-23 NOTE — Progress Notes (Addendum)
This chaplain responded to PMT NP-Michelle consult for EOL spiritual care. The chaplain checked in with the Pt. RN-Alisha before the visit and understands the Pt. family left immediately after today's family meeting.   The Pt. prayed at the Pt. bedside and is available for F/U spiritual care as needed.  **[phone] The chaplain is appreciative of RN's chat informing the chaplain of the Pt. family's return to the bedside. The chaplain is present with the family for storytelling and prayer.  The chaplain understands plans for Hospice care are coming together.   Chaplain Sallyanne Kuster [phone]

## 2022-07-23 NOTE — Progress Notes (Addendum)
CSW received consult from Palliative to make referral for residential hospice for patient to [facility]. CSW called and spoke with patients spouse Carmen Green who gave CSW permission to make referral for patient to [facility]. CSW called and spoke with [facility] and made referral for patient. CSW will continue to follow and assist with patients dc planning needs.  Update- 4:00pm-CSW received call back from [employer] with [facility] who confirmed they can accept patient today. CSW informed MD.

## 2022-07-23 NOTE — Progress Notes (Signed)
Neurology Progress Note  Brief HPI: Carmen Green is a 72 y.o. female with history of complete heart block status post pacemaker, mitral valve prolapse, GERD, CHF with EF of 25% and esophagitis who initially presented after cardiac arrest with unclear downtime on 1/22.  Patient received bystander CPR and 1 shock with ROSC achieved then.  She has been intubated in the ICU since then and has developed twitching movements of bilateral upper extremities, jaw and shoulders.  She was extubated 1/31.   Subjective: Patient has been extubated but still is unable to follow commands or track.  Per nursing, she has been intermittently agitated without known triggers.  Twitching movements only occur when patient is very agitated.  Goals of care meeting to be held with family at 11:00 today.  Exam: Vitals:   07/23/22 0700 07/23/22 0751  BP: (!) 139/58   Pulse: 88   Resp: 16   Temp:  98.8 F (37.1 C)  SpO2: 98%    Gen: Elderly female, NAD Resp: no acute distress Abd: soft, nt   NEURO:  Mental Status: Patient is in bed with eyes closed, does not respond to name and does not follow commands.  Will localize noxious stimuli with right upper extremity Cranial Nerves:  PERRL,  positive oculocephalic reflex, does not track  Motor: Moves bilateral upper extremities with antigravity strength, moves toes and feet on bilateral lower extremities spontaneously and towards painful stimuli.  Able to localize sternal rub with right upper extremity, withdraws left upper extremity to noxious. Sensation: Moves all extremities to noxious stimuli      Imaging Reviewed: CT-scan of the brain 1/23: No acute abnormality   CT head 1/26: No acute abnormality   MRI examination of the brain: Unable to perform due to pacemaker   BMW:UXLK study showed generalized periodic discharges with triphasic morphology likely due to toxic-metabolic causes like cefepime toxicity.  Additionally there is moderate degree of  encephalopathy.  No seizures or definite epileptiform discharges were seen throughout the recording   Assessment:  72 year old patient with above past medical history status postcardiac arrest with bystander CPR and ROSC achieved after unknown downtime now has developed twitching movements of the bilateral upper extremities, jaw and shoulders. Right stem reflexes are preserved, however patient is unable to follow commands and displays only nonpurposeful movement of extremities. CT head has been performed twice and shows no acute abnormalities, MRI unable to be performed due to pacemaker. EEG shows GPD's with triphasic morphology, likely due to cefepime toxicity. Cefepime toxicity is the likely source of patient's twitching movements, would switch to alternate antibiotic and reassess in 72 hours. Cefepime from 1/25-1/29.  Twitching movements have ceased after cefepime has been stopped, except when patient is quite agitated.  Impression:  Myoclonic jerking and encephalopathy in the setting of cefepime toxicity   Recommendations: - see attending attestation below.   Patient seen and examined by NP/APP with MD. MD to update note as needed.   [place] , MSN, AGACNP-BC [facility] See Amion for schedule and pager information 07/23/2022 8:03 AM    NEUROHOSPITALIST ADDENDUM Performed a face to face diagnostic evaluation.   I have reviewed the contents of history and physical exam as documented by PA/ARNP/Resident and agree with above documentation.  I have discussed and formulated the above plan as documented. Edits to the note have been made as needed.  Impression/Key exam findings/Plan: spoke with family, prognosis indeterminate, wil require prolonged supportive care including PEG tube and nursing home  given the extent of her encephalopathy. Prefers extension over flexion and I suspect that is probably due to rubrospinal track being much more presered than corticospinal  track. Overall, concern for dysfunction of higher cerebral tracks. Hard to characterize and thus discussed with family that prognosis is indeterminate but likely going to require significant care and unlikely to return to her functional baseline.  Family deciding next steps when I left to handle an emergency but was notified that they opted for comfort care.  At this time, we will signoff. Please feel free to contact us with any questions or concerns.  Donnetta Simpers, MD [facility] [phone]   If 7pm to 7am, please call on call as listed on AMION.

## 2022-07-23 NOTE — Progress Notes (Signed)
PROGRESS NOTE    Carmen Green  BSJ:[id number] DOB: [birth year] DOA: 07/12/2022 PCP: [name], DO    Brief Narrative:  72 year old female with multiple medical issues who was recently undergoing cardiac workup for new onset of shortness of breath and fatigue, systolic heart failure with left ventricular ejection fraction 25% with severe MR unfortunately had a cardiac arrest at home on 1/22.  EMS found with PEA arrest.  Rhythm changed to V-fib, she was defibrillated and ROSC was achieved.  Patient was admitted to intensive care unit postcardiac arrest.  Followed by critical care, neurology and cardiology. Significant events  1/23 Admission following cardiac arrest; EP consulted for runs of ventricular ectopy and turned pacer up to 90 with significant improvement; Echo LVEF <20% w/ severe MR 1/24 off levo; WBC trending up and tmax 99.3, cooling per TTM protocol; cultures sent and started on broad spectrum abx 1/25 serratia in trach cx> ctx> cefepime, weaning but mental status remains slow to improve 1/27 more awake but not consistently following commands.  Started on low-dose Precedex for tube tolerance. 1/31 Improving neuro exam, +purposeful movement.Weaning well on PSV 10/5, extubated. Tolerated well; not following commands. Intermittent agitation requiring Precedex. 2/1 Neuro exam grossly unchanged; more agitated; will occasionally track/make eye contact, not following commands. Indeterminate neuroprognostics per Neuro.  Assessment & Plan:  Cardiac arrest with V. 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  Continue post cardiac care.  Followed by heart failure team. Currently remains on amiodarone, losartan and carvedilol and aldactone.  L/RHC and TEE once more hemodynamically stable/neuro  status improved, however patient remains in very poor neurological status. A-paced rhythm for A-fib, remains on amiodarone both 200 mg twice daily.  Did not tolerate heparin infusion, remains on aspirin.   Concern for anoxic encephalopathy vs ongoing metabolic encephalopathy,  suspected Cefepime toxicity. CT Head NAICA. Neuro following, appreciate recommendations Unable to obtain MRI due to PPM incompatibility Off of cefepime since 1/29 Neuro exam remains poor.  Unpurposeful intermittent agitation.  Not following any commands.   Neurology continues to follow.  Only way to know about improvement will be given more time.   Tolerating tube feeding.  Acute hypoxemic respiratory failure due to pulmonary edema and Serratia PNA  Hypoxic ischemic encephalopathy following cardiac arrest Extubated 1/31AM to [place] Tolerated well from a respiratory standpoint Continue supplemental O2 support Wean O2 for sat > 90% Pulmonary hygiene as much she can do. Completed antibiotic therapy.   Acute on chronic anemia:  Received 2 units of PRBC for hemoglobin 7.3.  Stable today.  Goal is to keep hemoglobin more than 8.   Hx anxiety, depression Hold home buspirone, lorazepam for now to allow clearance of mental status.  Currently on oxycodone as needed, Klonopin as needed.  See goal of care discussion.   Goal of care discussion:  DNR code status Palliative following. Goal of care discussion plan today. Patient remains in very poor clinical status along with poor neurological recovery and underlying severe cardiovascular issues.  If family decides, she is appropriate candidate for comfort care and hospice. See ongoing goal of care discussions with palliative care.     DVT prophylaxis: SCDs   Code Status: DNR Family Communication: Palliative meeting with family.  Will follow-up. Disposition Plan: Status is: Inpatient Remains inpatient appropriate because: Persistent symptoms     Consultants:  Neurology Cardiology Critical care  Procedures:  None  Antimicrobials:  Cefepime 1/22-1/29 [name] 1/29-2/1   Subjective: Patient seen in the morning rounds.  Occasionally she moans and looks uncomfortable.  Unable to respond to commands or follow.  Intermittently agitated at night.  She also received a dose of Klonopin at 6 AM.  Oxycodone at 5 AM.  She does withdraws to some stimuli.  Remains A-fib with paced rhythm.  On minimal oxygen.  Tube feeding infusing.  Objective: Vitals:   07/23/22 0700 07/23/22 0751 07/23/22 0800 07/23/22 0900  BP: (!) 139/58  (!) 138/48 (!) 147/62  Pulse: 88  90 90  Resp: 16  20 (!) 24  Temp:  98.8 F (37.1 C)    TempSrc:  Oral    SpO2: 98%  97% 96%  Weight:      Height:        Intake/Output Summary (Last 24 hours) at 07/23/2022 1114 Last data filed at 07/23/2022 0902 Gross per 24 hour  Intake 1433.5 ml  Output 1050 ml  Net 383.5 ml   Filed Weights   07/21/22 0203 07/22/22 0318 07/23/22 0434  Weight: 67.2 kg 66.9 kg 66.9 kg    Examination:  General exam: Appears sick.  Does not follow commands.  She has some unpurposeful movement. She resist on some of the movements including opening eyes. Respiratory system: Mostly conducted upper airway sounds. Cardiovascular system: S1 & S2 heard, irregularly rhythm.  Pacemaker in place.   Gastrointestinal system: Soft.  Discomfort on exam.  Bowel sound present.     Data Reviewed: I have personally reviewed following labs and imaging studies  CBC: Recent Labs  Lab 07/19/22 0600 07/19/22 1000 07/20/22 1539 07/20/22 2123 07/21/22 0859 07/21/22 2332 07/23/22 0412  WBC 9.0   < > 10.0 10.5 10.1 7.9 9.9  NEUTROABS 7.0  --   --   --   --   --   --   HGB 7.3*   < > 8.3* 8.3* 7.9* 7.8* 8.6*  HCT 22.4*   < > 24.5* 25.5* 23.5* 24.1* 26.7*  MCV 102.3*   < > 99.2 101.6* 100.4* 100.0 101.1*  PLT 142*   < > 171 169 162 164 242   < > = values in this interval not displayed.   Basic Metabolic  Panel: Recent Labs  Lab 07/17/22 0454 07/18/22 0410 07/18/22 0416 07/19/22 0438 07/20/22 0405 07/21/22 0407 07/22/22 0303 07/23/22 0412  NA 136   < > 137 138 140 139 136 137  K 3.9   < > 4.2 3.9 3.9 4.0 3.9 4.0  CL 108  --  108 109 108 105 99 103  CO2 22  --  '24 24 26 26 31 26  '$ GLUCOSE 169*  --  150* 146* 162* 145* 122* 137*  BUN 24*  --  25* 28* 30* 23 24* 24*  CREATININE 0.73  --  0.67 0.65 0.73 0.63 0.59 0.66  CALCIUM 8.8*  --  8.9 8.7* 8.8* 8.5* 8.3* 8.5*  MG 2.0  --  2.0  --  2.2  --  2.3 2.3  PHOS  --   --   --   --   --   --  3.4  --    < > = values in this interval not displayed.   GFR: Estimated Creatinine Clearance: 54.8 mL/min (by C-G formula based on SCr of 0.66 mg/dL). Liver Function Tests: Recent Labs  Lab 07/17/22 0454  AST 61*  ALT 111*  ALKPHOS  49  BILITOT 0.6  PROT 5.3*  ALBUMIN 2.6*   No results for input(s): "LIPASE", "AMYLASE" in the last 168 hours. No results for input(s): "AMMONIA" in the last 168 hours. Coagulation Profile: No results for input(s): "INR", "PROTIME" in the last 168 hours. Cardiac Enzymes: No results for input(s): "CKTOTAL", "CKMB", "CKMBINDEX", "TROPONINI" in the last 168 hours. BNP (last 3 results) Recent Labs    06/28/22 1040  PROBNP 3,230*   HbA1C: No results for input(s): "HGBA1C" in the last 72 hours. CBG: Recent Labs  Lab 07/22/22 1556 07/22/22 1940 07/22/22 2349 07/23/22 0403 07/23/22 0753  GLUCAP 111* 134* 138* 128* 140*   Lipid Profile: No results for input(s): "CHOL", "HDL", "LDLCALC", "TRIG", "CHOLHDL", "LDLDIRECT" in the last 72 hours. Thyroid Function Tests: No results for input(s): "TSH", "T4TOTAL", "FREET4", "T3FREE", "THYROIDAB" in the last 72 hours. Anemia Panel: No results for input(s): "VITAMINB12", "FOLATE", "FERRITIN", "TIBC", "IRON", "RETICCTPCT" in the last 72 hours. Sepsis Labs: No results for input(s): "PROCALCITON", "LATICACIDVEN" in the last 168 hours.  Recent Results (from the past  240 hour(s))  MRSA Next Gen by PCR, Nasal     Status: None   Collection Time: 07/13/22  6:36 PM   Specimen: Nasal Mucosa; Nasal Swab  Result Value Ref Range Status   MRSA by PCR Next Gen NOT DETECTED NOT DETECTED Final    Comment: (NOTE) The GeneXpert MRSA Assay (FDA approved for NASAL specimens only), is one component of a comprehensive MRSA colonization surveillance program. It is not intended to diagnose MRSA infection nor to guide or monitor treatment for MRSA infections. Test performance is not FDA approved in patients less than 32 years old. Performed at [facility], [place] [address], [place], [place] [address]   Resp panel by RT-PCR (RSV, Flu A&B, Covid) Urine, Clean Catch     Status: None   Collection Time: 07/14/22  9:50 AM   Specimen: Urine, Clean Catch; Nasal Swab  Result Value Ref Range Status   SARS Coronavirus 2 by RT PCR NEGATIVE NEGATIVE Final    Comment: (NOTE) SARS-CoV-2 target nucleic acids are NOT DETECTED.  The SARS-CoV-2 RNA is generally detectable in upper respiratory specimens during the acute phase of infection. The lowest concentration of SARS-CoV-2 viral copies this assay can detect is 138 copies/mL. A negative result does not preclude SARS-Cov-2 infection and should not be used as the sole basis for treatment or other patient management decisions. A negative result may occur with  improper specimen collection/handling, submission of specimen other than nasopharyngeal swab, presence of viral mutation(s) within the areas targeted by this assay, and inadequate number of viral copies(<138 copies/mL). A negative result must be combined with clinical observations, patient history, and epidemiological information. The expected result is Negative.  Fact Sheet for Patients:  EntrepreneurPulse.com.au  Fact Sheet for Healthcare Providers:  IncredibleEmployment.be  This test is no t yet approved or cleared by the [place] FDA and  has been authorized for detection and/or diagnosis of SARS-CoV-2 by FDA under an Emergency Use Authorization (EUA). This EUA will remain  in effect (meaning this test can be used) for the duration of the COVID-19 declaration under Section 564(b)(1) of the Act, 21 U.S.C.section 360bbb-3(b)(1), unless the authorization is terminated  or revoked sooner.       Influenza A by PCR NEGATIVE NEGATIVE Final   Influenza B by PCR NEGATIVE NEGATIVE Final    Comment: (NOTE) The Xpert Xpress SARS-CoV-2/FLU/RSV plus assay is intended as an aid in the  diagnosis of influenza from Nasopharyngeal swab specimens and should not be used as a sole basis for treatment. Nasal washings and aspirates are unacceptable for Xpert Xpress SARS-CoV-2/FLU/RSV testing.  Fact Sheet for Patients: EntrepreneurPulse.com.au  Fact Sheet for Healthcare Providers: IncredibleEmployment.be  This test is not yet approved or cleared by the [place] FDA and has been authorized for detection and/or diagnosis of SARS-CoV-2 by FDA under an Emergency Use Authorization (EUA). This EUA will remain in effect (meaning this test can be used) for the duration of the COVID-19 declaration under Section 564(b)(1) of the Act, 21 U.S.C. section 360bbb-3(b)(1), unless the authorization is terminated or revoked.     Resp Syncytial Virus by PCR NEGATIVE NEGATIVE Final    Comment: (NOTE) Fact Sheet for Patients: EntrepreneurPulse.com.au  Fact Sheet for Healthcare Providers: IncredibleEmployment.be  This test is not yet approved or cleared by the [place] FDA and has been authorized for detection and/or diagnosis of SARS-CoV-2 by FDA under an Emergency Use Authorization (EUA). This EUA will remain in effect (meaning this test can be used) for the duration of the COVID-19 declaration under Section 564(b)(1) of the Act, 21 U.S.C. section  360bbb-3(b)(1), unless the authorization is terminated or revoked.  Performed at [facility], [place] [address] [place], [place] [address]   Urine Culture     Status: None   Collection Time: 07/14/22  9:53 AM   Specimen: Urine, Clean Catch  Result Value Ref Range Status   Specimen Description URINE, CLEAN CATCH  Final   Special Requests NONE  Final   Culture   Final    NO GROWTH Performed at [facility], [place] [address], [place], [place] [address]    Report Status 07/15/2022 FINAL  Final  Culture, Respiratory w Gram Stain     Status: None   Collection Time: 07/14/22  9:54 AM   Specimen: Tracheal Aspirate; Respiratory  Result Value Ref Range Status   Specimen Description TRACHEAL ASPIRATE  Final   Special Requests NONE  Final   Gram Stain   Final    NO WBC SEEN RARE GRAM POSITIVE RODS RARE GRAM NEGATIVE RODS Performed at [facility], [address] [place], [place] [address]    Culture FEW SERRATIA MARCESCENS  Final   Report Status 07/16/2022 FINAL  Final   Organism ID, Bacteria SERRATIA MARCESCENS  Final      Susceptibility   Serratia marcescens - MIC*    CEFAZOLIN >=64 RESISTANT Resistant     CEFEPIME <=0.12 SENSITIVE Sensitive     CEFTAZIDIME <=1 SENSITIVE Sensitive     CEFTRIAXONE <=0.25 SENSITIVE Sensitive     CIPROFLOXACIN <=0.25 SENSITIVE Sensitive     GENTAMICIN <=1 SENSITIVE Sensitive     TRIMETH/SULFA <=20 SENSITIVE Sensitive     * FEW SERRATIA MARCESCENS  Culture, blood (Routine X 2) w Reflex to ID Panel     Status: None   Collection Time: 07/14/22 10:23 AM   Specimen: BLOOD RIGHT HAND  Result Value Ref Range Status   Specimen Description BLOOD RIGHT HAND  Final   Special Requests IN PEDIATRIC BOTTLE Blood Culture adequate volume  Final   Culture   Final    NO GROWTH 5 DAYS Performed at [facility], [place] [employer] [address], [place], [place] [address]    Report Status 07/19/2022 FINAL  Final  Culture, blood (Routine X 2) w Reflex to ID  Panel     Status: None   Collection Time: 07/14/22 10:31  AM   Specimen: BLOOD LEFT HAND  Result Value Ref Range Status   Specimen Description BLOOD LEFT HAND  Final   Special Requests IN PEDIATRIC BOTTLE Blood Culture adequate volume  Final   Culture   Final    NO GROWTH 5 DAYS Performed at [facility], [address] [place], [place] [address]    Report Status 07/19/2022 FINAL  Final         Radiology Studies: DG CHEST PORT 1 VIEW  Result Date: 07/23/2022 CLINICAL DATA:  Dyspnea, respiratory abnormalities EXAM: PORTABLE CHEST 1 VIEW COMPARISON:  Portable exam 0824 hours compared to 07/13/2022 FINDINGS: Feeding tube extends into stomach. RIGHT jugular line tip projects over SVC. LEFT subclavian sequential transvenous pacemaker leads project at RIGHT atrium and RIGHT ventricle. Enlargement of cardiac silhouette. Atherosclerotic calcification aorta. Scattered pulmonary infiltrates in the mid to lower lungs greater on LEFT, could represent edema or infection, increased since previous exam. Trace LEFT pleural effusion. No pneumothorax. IMPRESSION: Increased pulmonary infiltrates question asymmetric edema versus infection. Electronically Signed   By: Lavonia Dana M.D.   On: 07/23/2022 08:33   DG Abd Portable 1V  Result Date: 07/21/2022 CLINICAL DATA:  [id number] Encounter for feeding tube placement [id number] EXAM: PORTABLE ABDOMEN - 1 VIEW COMPARISON:  07/13/2022 FINDINGS: Feeding tube tip overlies the gastric body. Previous orogastric tube is been removed. Nonobstructive bowel gas pattern. Unchanged cardiomegaly. Left basilar opacities, similar prior chest radiograph with trace effusions. IMPRESSION: Feeding tube tip overlies the stomach. Electronically Signed   By: Maurine Simmering M.D.   On: 07/21/2022 12:47        Scheduled Meds:  sodium chloride   Intravenous Once   amiodarone  400 mg Per Tube BID   Followed by   Derrill Memo ON 07/29/2022] amiodarone  200 mg Per Tube BID   aspirin  81 mg Per  Tube Daily   carvedilol  3.125 mg Per Tube BID WC   Chlorhexidine Gluconate Cloth  6 each Topical Daily   feeding supplement (PROSource TF20)  60 mL Per Tube Daily   fiber supplement (BANATROL TF)  60 mL Per Tube BID   [START ON 07/24/2022] losartan  25 mg Per Tube Daily   pantoprazole (PROTONIX) IV  40 mg Intravenous Q12H   spironolactone  25 mg Per Tube Daily   Continuous Infusions:  sodium chloride 10 mL/hr at 07/23/22 0902   feeding supplement (VITAL 1.5 CAL) 45 mL/hr at 07/23/22 0902     LOS: 11 days    Time spent: 35 minutes    Barb Merino, MD [facility] Pager [phone]

## 2022-07-28 ENCOUNTER — Ambulatory Visit (HOSPITAL_COMMUNITY): Admission: RE | Admit: 2022-07-28 | Payer: Medicare Other | Source: Home / Self Care | Admitting: Cardiovascular Disease

## 2022-07-28 ENCOUNTER — Encounter (HOSPITAL_COMMUNITY): Admission: RE | Payer: Self-pay | Source: Home / Self Care

## 2022-07-28 SURGERY — RIGHT/LEFT HEART CATH AND CORONARY ANGIOGRAPHY
Anesthesia: LOCAL

## 2022-08-02 ENCOUNTER — Encounter: Payer: Medicare Other | Admitting: Thoracic Surgery (Cardiothoracic Vascular Surgery)

## 2022-08-04 ENCOUNTER — Encounter: Payer: Medicare Other | Admitting: Thoracic Surgery (Cardiothoracic Vascular Surgery)

## 2022-08-09 ENCOUNTER — Encounter: Payer: Medicare Other | Admitting: Student

## 2022-08-20 DEATH — deceased

## 2022-09-20 ENCOUNTER — Encounter: Payer: Medicare Other | Admitting: Student
